# Patient Record
Sex: Female | Born: 1950 | Race: White | Hispanic: No | Marital: Married | Smoking: Current some day smoker
Health system: Southern US, Community
[De-identification: ages and names within clinical notes are randomized; demographics above are authoritative.]

## PROBLEM LIST (undated history)

## (undated) DIAGNOSIS — M1990 Unspecified osteoarthritis, unspecified site: Secondary | ICD-10-CM

## (undated) DIAGNOSIS — E785 Hyperlipidemia, unspecified: Secondary | ICD-10-CM

## (undated) DIAGNOSIS — F32A Depression, unspecified: Secondary | ICD-10-CM

## (undated) DIAGNOSIS — J329 Chronic sinusitis, unspecified: Secondary | ICD-10-CM

## (undated) DIAGNOSIS — M797 Fibromyalgia: Secondary | ICD-10-CM

## (undated) DIAGNOSIS — F319 Bipolar disorder, unspecified: Secondary | ICD-10-CM

## (undated) DIAGNOSIS — F419 Anxiety disorder, unspecified: Secondary | ICD-10-CM

## (undated) DIAGNOSIS — B009 Herpesviral infection, unspecified: Secondary | ICD-10-CM

## (undated) DIAGNOSIS — Z973 Presence of spectacles and contact lenses: Secondary | ICD-10-CM

## (undated) DIAGNOSIS — K219 Gastro-esophageal reflux disease without esophagitis: Principal | ICD-10-CM

## (undated) HISTORY — DX: Bipolar disorder, unspecified: F31.9

## (undated) HISTORY — DX: Fibromyalgia: M79.7

## (undated) HISTORY — DX: Gastro-esophageal reflux disease without esophagitis: K21.9

## (undated) HISTORY — DX: Unspecified osteoarthritis, unspecified site: M19.90

## (undated) HISTORY — DX: Chronic sinusitis, unspecified: J32.9

---
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
## 1984-10-21 HISTORY — PX: TUBAL LIGATION: SHX77

## 1999-02-24 ENCOUNTER — Other Ambulatory Visit: Admission: RE | Admit: 1999-02-24 | Discharge: 1999-02-24 | Payer: Self-pay | Admitting: Family Medicine

## 1999-05-13 ENCOUNTER — Emergency Department (HOSPITAL_COMMUNITY): Admission: EM | Admit: 1999-05-13 | Discharge: 1999-05-13 | Payer: Self-pay | Admitting: Emergency Medicine

## 2000-05-03 ENCOUNTER — Encounter: Admission: RE | Admit: 2000-05-03 | Discharge: 2000-05-03 | Payer: Self-pay | Admitting: Family Medicine

## 2000-05-03 ENCOUNTER — Encounter: Payer: Self-pay | Admitting: Family Medicine

## 2001-05-09 ENCOUNTER — Encounter: Admission: RE | Admit: 2001-05-09 | Discharge: 2001-05-09 | Payer: Self-pay | Admitting: Family Medicine

## 2001-05-09 ENCOUNTER — Encounter: Payer: Self-pay | Admitting: Family Medicine

## 2001-05-29 ENCOUNTER — Encounter: Payer: Self-pay | Admitting: Family Medicine

## 2001-05-29 ENCOUNTER — Encounter: Admission: RE | Admit: 2001-05-29 | Discharge: 2001-05-29 | Payer: Self-pay | Admitting: Family Medicine

## 2002-04-09 ENCOUNTER — Encounter: Admission: RE | Admit: 2002-04-09 | Discharge: 2002-04-09 | Payer: Self-pay | Admitting: Family Medicine

## 2002-04-09 ENCOUNTER — Encounter: Payer: Self-pay | Admitting: Family Medicine

## 2002-06-02 ENCOUNTER — Encounter: Admission: RE | Admit: 2002-06-02 | Discharge: 2002-06-02 | Payer: Self-pay | Admitting: Family Medicine

## 2002-06-02 ENCOUNTER — Encounter: Payer: Self-pay | Admitting: Family Medicine

## 2002-12-31 DIAGNOSIS — K219 Gastro-esophageal reflux disease without esophagitis: Secondary | ICD-10-CM

## 2002-12-31 HISTORY — DX: Gastro-esophageal reflux disease without esophagitis: K21.9

## 2003-08-18 ENCOUNTER — Encounter: Payer: Self-pay | Admitting: Family Medicine

## 2003-08-18 ENCOUNTER — Encounter: Admission: RE | Admit: 2003-08-18 | Discharge: 2003-08-18 | Payer: Self-pay | Admitting: Family Medicine

## 2004-09-07 ENCOUNTER — Ambulatory Visit: Payer: Self-pay | Admitting: Psychiatry

## 2004-09-15 ENCOUNTER — Encounter: Admission: RE | Admit: 2004-09-15 | Discharge: 2004-09-15 | Payer: Self-pay | Admitting: Family Medicine

## 2004-10-09 ENCOUNTER — Ambulatory Visit: Payer: Self-pay | Admitting: Psychology

## 2004-12-28 ENCOUNTER — Ambulatory Visit: Payer: Self-pay | Admitting: Psychiatry

## 2005-03-01 ENCOUNTER — Ambulatory Visit: Payer: Self-pay | Admitting: Psychology

## 2005-05-02 ENCOUNTER — Ambulatory Visit: Payer: Self-pay | Admitting: Psychology

## 2005-06-01 ENCOUNTER — Ambulatory Visit: Payer: Self-pay | Admitting: Psychology

## 2005-07-17 ENCOUNTER — Ambulatory Visit: Payer: Self-pay | Admitting: Psychiatry

## 2005-08-22 ENCOUNTER — Ambulatory Visit: Payer: Self-pay | Admitting: Psychology

## 2005-10-09 ENCOUNTER — Ambulatory Visit: Payer: Self-pay | Admitting: Psychology

## 2005-11-20 ENCOUNTER — Ambulatory Visit: Payer: Self-pay | Admitting: Psychology

## 2006-01-10 ENCOUNTER — Ambulatory Visit: Payer: Self-pay | Admitting: Psychiatry

## 2006-02-28 ENCOUNTER — Ambulatory Visit: Payer: Self-pay | Admitting: Psychology

## 2006-03-29 ENCOUNTER — Ambulatory Visit (HOSPITAL_COMMUNITY): Payer: Self-pay | Admitting: Psychology

## 2006-04-09 ENCOUNTER — Ambulatory Visit (HOSPITAL_COMMUNITY): Payer: Self-pay | Admitting: Psychiatry

## 2006-04-29 ENCOUNTER — Ambulatory Visit (HOSPITAL_COMMUNITY): Payer: Self-pay | Admitting: Psychology

## 2006-05-29 ENCOUNTER — Ambulatory Visit (HOSPITAL_COMMUNITY): Payer: Self-pay | Admitting: Psychology

## 2006-06-25 ENCOUNTER — Ambulatory Visit (HOSPITAL_COMMUNITY): Payer: Self-pay | Admitting: Psychology

## 2006-07-09 ENCOUNTER — Ambulatory Visit (HOSPITAL_COMMUNITY): Payer: Self-pay | Admitting: Psychiatry

## 2006-07-22 ENCOUNTER — Ambulatory Visit (HOSPITAL_COMMUNITY): Payer: Self-pay | Admitting: Psychology

## 2006-08-21 ENCOUNTER — Ambulatory Visit (HOSPITAL_COMMUNITY): Payer: Self-pay | Admitting: Psychology

## 2006-09-11 ENCOUNTER — Ambulatory Visit (HOSPITAL_COMMUNITY): Payer: Self-pay | Admitting: Psychology

## 2006-10-01 ENCOUNTER — Ambulatory Visit (HOSPITAL_COMMUNITY): Payer: Self-pay | Admitting: Psychiatry

## 2006-10-02 ENCOUNTER — Ambulatory Visit (HOSPITAL_COMMUNITY): Payer: Self-pay | Admitting: Psychology

## 2006-10-23 ENCOUNTER — Ambulatory Visit (HOSPITAL_COMMUNITY): Payer: Self-pay | Admitting: Psychology

## 2006-11-28 ENCOUNTER — Ambulatory Visit (HOSPITAL_COMMUNITY): Payer: Self-pay | Admitting: Psychology

## 2006-12-03 ENCOUNTER — Ambulatory Visit (HOSPITAL_COMMUNITY): Payer: Self-pay | Admitting: Psychiatry

## 2006-12-20 ENCOUNTER — Ambulatory Visit (HOSPITAL_COMMUNITY): Payer: Self-pay | Admitting: Psychology

## 2007-01-24 ENCOUNTER — Ambulatory Visit (HOSPITAL_COMMUNITY): Payer: Self-pay | Admitting: Psychology

## 2007-02-04 ENCOUNTER — Ambulatory Visit (HOSPITAL_COMMUNITY): Payer: Self-pay | Admitting: Psychiatry

## 2007-02-21 ENCOUNTER — Ambulatory Visit (HOSPITAL_COMMUNITY): Payer: Self-pay | Admitting: Psychology

## 2007-03-21 ENCOUNTER — Ambulatory Visit (HOSPITAL_COMMUNITY): Payer: Self-pay | Admitting: Psychology

## 2007-04-08 ENCOUNTER — Ambulatory Visit (HOSPITAL_COMMUNITY): Payer: Self-pay | Admitting: Psychiatry

## 2007-04-25 ENCOUNTER — Ambulatory Visit (HOSPITAL_COMMUNITY): Payer: Self-pay | Admitting: Psychology

## 2007-05-21 ENCOUNTER — Ambulatory Visit (HOSPITAL_COMMUNITY): Payer: Self-pay | Admitting: Psychology

## 2007-06-24 ENCOUNTER — Ambulatory Visit (HOSPITAL_COMMUNITY): Payer: Self-pay | Admitting: Psychology

## 2007-07-08 ENCOUNTER — Ambulatory Visit (HOSPITAL_COMMUNITY): Payer: Self-pay | Admitting: Psychiatry

## 2007-07-24 ENCOUNTER — Ambulatory Visit (HOSPITAL_COMMUNITY): Payer: Self-pay | Admitting: Psychology

## 2007-08-26 ENCOUNTER — Ambulatory Visit (HOSPITAL_COMMUNITY): Payer: Self-pay | Admitting: Psychology

## 2007-09-25 ENCOUNTER — Ambulatory Visit (HOSPITAL_COMMUNITY): Payer: Self-pay | Admitting: Psychology

## 2007-09-29 ENCOUNTER — Ambulatory Visit (HOSPITAL_COMMUNITY): Admission: RE | Admit: 2007-09-29 | Discharge: 2007-09-29 | Payer: Self-pay | Admitting: Obstetrics & Gynecology

## 2007-10-02 ENCOUNTER — Ambulatory Visit (HOSPITAL_COMMUNITY): Payer: Self-pay | Admitting: Psychiatry

## 2007-10-24 ENCOUNTER — Ambulatory Visit (HOSPITAL_COMMUNITY): Payer: Self-pay | Admitting: Psychology

## 2007-11-21 ENCOUNTER — Ambulatory Visit (HOSPITAL_COMMUNITY): Payer: Self-pay | Admitting: Psychology

## 2007-12-19 ENCOUNTER — Ambulatory Visit (HOSPITAL_COMMUNITY): Payer: Self-pay | Admitting: Psychology

## 2007-12-23 ENCOUNTER — Ambulatory Visit (HOSPITAL_COMMUNITY): Payer: Self-pay | Admitting: Psychiatry

## 2008-01-07 ENCOUNTER — Ambulatory Visit (HOSPITAL_COMMUNITY): Payer: Self-pay | Admitting: Psychology

## 2008-02-06 ENCOUNTER — Ambulatory Visit (HOSPITAL_COMMUNITY): Payer: Self-pay | Admitting: Psychology

## 2008-02-20 ENCOUNTER — Encounter: Admission: RE | Admit: 2008-02-20 | Discharge: 2008-02-20 | Payer: Self-pay | Admitting: Family

## 2008-03-05 ENCOUNTER — Ambulatory Visit (HOSPITAL_COMMUNITY): Payer: Self-pay | Admitting: Psychology

## 2008-03-18 ENCOUNTER — Ambulatory Visit (HOSPITAL_COMMUNITY): Payer: Self-pay | Admitting: Psychiatry

## 2008-04-02 ENCOUNTER — Ambulatory Visit (HOSPITAL_COMMUNITY): Payer: Self-pay | Admitting: Psychology

## 2008-04-30 ENCOUNTER — Ambulatory Visit (HOSPITAL_COMMUNITY): Payer: Self-pay | Admitting: Psychology

## 2008-05-26 ENCOUNTER — Ambulatory Visit (HOSPITAL_COMMUNITY): Payer: Self-pay | Admitting: Psychology

## 2008-06-10 ENCOUNTER — Ambulatory Visit (HOSPITAL_COMMUNITY): Payer: Self-pay | Admitting: Psychiatry

## 2008-06-16 ENCOUNTER — Ambulatory Visit (HOSPITAL_COMMUNITY): Payer: Self-pay | Admitting: Psychology

## 2008-08-05 ENCOUNTER — Ambulatory Visit (HOSPITAL_COMMUNITY): Payer: Self-pay | Admitting: Psychology

## 2008-09-09 ENCOUNTER — Ambulatory Visit (HOSPITAL_COMMUNITY): Payer: Self-pay | Admitting: Psychiatry

## 2008-09-21 ENCOUNTER — Ambulatory Visit (HOSPITAL_COMMUNITY): Payer: Self-pay | Admitting: Psychology

## 2008-10-22 ENCOUNTER — Ambulatory Visit (HOSPITAL_COMMUNITY): Payer: Self-pay | Admitting: Psychology

## 2008-11-22 ENCOUNTER — Ambulatory Visit (HOSPITAL_COMMUNITY): Payer: Self-pay | Admitting: Psychology

## 2008-12-02 ENCOUNTER — Ambulatory Visit (HOSPITAL_COMMUNITY): Payer: Self-pay | Admitting: Psychiatry

## 2008-12-21 ENCOUNTER — Ambulatory Visit (HOSPITAL_COMMUNITY): Payer: Self-pay | Admitting: Psychology

## 2009-01-21 ENCOUNTER — Ambulatory Visit (HOSPITAL_COMMUNITY): Payer: Self-pay | Admitting: Psychology

## 2009-02-22 ENCOUNTER — Ambulatory Visit (HOSPITAL_COMMUNITY): Payer: Self-pay | Admitting: Psychology

## 2009-03-24 ENCOUNTER — Ambulatory Visit (HOSPITAL_COMMUNITY): Payer: Self-pay | Admitting: Psychology

## 2009-03-29 ENCOUNTER — Ambulatory Visit (HOSPITAL_COMMUNITY): Payer: Self-pay | Admitting: Psychiatry

## 2009-04-06 ENCOUNTER — Encounter: Admission: RE | Admit: 2009-04-06 | Discharge: 2009-04-06 | Payer: Self-pay | Admitting: Family Medicine

## 2009-04-21 ENCOUNTER — Ambulatory Visit (HOSPITAL_COMMUNITY): Payer: Self-pay | Admitting: Psychology

## 2009-06-01 ENCOUNTER — Ambulatory Visit (HOSPITAL_COMMUNITY): Payer: Self-pay | Admitting: Psychology

## 2009-06-29 ENCOUNTER — Ambulatory Visit (HOSPITAL_COMMUNITY): Payer: Self-pay | Admitting: Psychology

## 2009-08-17 ENCOUNTER — Ambulatory Visit (HOSPITAL_COMMUNITY): Payer: Self-pay | Admitting: Psychology

## 2009-08-23 ENCOUNTER — Ambulatory Visit (HOSPITAL_COMMUNITY): Payer: Self-pay | Admitting: Psychiatry

## 2009-09-08 ENCOUNTER — Ambulatory Visit (HOSPITAL_COMMUNITY): Payer: Self-pay | Admitting: Psychology

## 2009-10-18 ENCOUNTER — Ambulatory Visit (HOSPITAL_COMMUNITY): Payer: Self-pay | Admitting: Psychology

## 2009-11-17 ENCOUNTER — Ambulatory Visit (HOSPITAL_COMMUNITY): Payer: Self-pay | Admitting: Psychiatry

## 2009-11-29 ENCOUNTER — Ambulatory Visit (HOSPITAL_COMMUNITY): Payer: Self-pay | Admitting: Psychology

## 2010-01-03 ENCOUNTER — Ambulatory Visit (HOSPITAL_COMMUNITY): Payer: Self-pay | Admitting: Psychology

## 2010-02-02 ENCOUNTER — Ambulatory Visit (HOSPITAL_COMMUNITY): Payer: Self-pay | Admitting: Psychology

## 2010-02-16 ENCOUNTER — Ambulatory Visit (HOSPITAL_COMMUNITY): Payer: Self-pay | Admitting: Psychiatry

## 2010-03-01 ENCOUNTER — Ambulatory Visit (HOSPITAL_COMMUNITY): Payer: Self-pay | Admitting: Psychology

## 2010-04-04 ENCOUNTER — Ambulatory Visit (HOSPITAL_COMMUNITY): Payer: Self-pay | Admitting: Psychology

## 2010-05-04 ENCOUNTER — Ambulatory Visit (HOSPITAL_COMMUNITY): Payer: Self-pay | Admitting: Psychology

## 2010-05-16 ENCOUNTER — Ambulatory Visit (HOSPITAL_COMMUNITY): Payer: Self-pay | Admitting: Psychiatry

## 2010-06-01 ENCOUNTER — Ambulatory Visit (HOSPITAL_COMMUNITY): Payer: Self-pay | Admitting: Psychology

## 2010-06-13 ENCOUNTER — Encounter: Admission: RE | Admit: 2010-06-13 | Discharge: 2010-06-13 | Payer: Self-pay | Admitting: Family Medicine

## 2010-06-16 ENCOUNTER — Encounter: Admission: RE | Admit: 2010-06-16 | Discharge: 2010-06-16 | Payer: Self-pay | Admitting: Family Medicine

## 2010-07-05 ENCOUNTER — Ambulatory Visit (HOSPITAL_COMMUNITY): Payer: Self-pay | Admitting: Psychology

## 2010-08-04 ENCOUNTER — Ambulatory Visit (HOSPITAL_COMMUNITY): Payer: Self-pay | Admitting: Psychology

## 2010-08-15 ENCOUNTER — Ambulatory Visit (HOSPITAL_COMMUNITY): Payer: Self-pay | Admitting: Psychiatry

## 2010-09-06 ENCOUNTER — Ambulatory Visit (HOSPITAL_COMMUNITY): Payer: Self-pay | Admitting: Psychology

## 2010-10-05 ENCOUNTER — Ambulatory Visit (HOSPITAL_COMMUNITY): Payer: Self-pay | Admitting: Psychology

## 2010-10-19 ENCOUNTER — Ambulatory Visit (HOSPITAL_COMMUNITY): Payer: Self-pay | Admitting: Psychiatry

## 2010-11-10 ENCOUNTER — Ambulatory Visit (HOSPITAL_COMMUNITY): Payer: Self-pay | Admitting: Psychology

## 2010-12-07 ENCOUNTER — Encounter
Admission: RE | Admit: 2010-12-07 | Discharge: 2010-12-07 | Payer: Self-pay | Source: Home / Self Care | Admitting: Family Medicine

## 2010-12-08 ENCOUNTER — Ambulatory Visit (HOSPITAL_COMMUNITY): Payer: Self-pay | Admitting: Psychology

## 2011-01-03 ENCOUNTER — Ambulatory Visit (HOSPITAL_COMMUNITY)
Admission: RE | Admit: 2011-01-03 | Discharge: 2011-01-03 | Payer: Self-pay | Source: Home / Self Care | Attending: Psychology | Admitting: Psychology

## 2011-01-17 ENCOUNTER — Ambulatory Visit (HOSPITAL_COMMUNITY)
Admission: RE | Admit: 2011-01-17 | Discharge: 2011-01-17 | Payer: Self-pay | Source: Home / Self Care | Attending: Psychology | Admitting: Psychology

## 2011-01-18 ENCOUNTER — Ambulatory Visit (HOSPITAL_COMMUNITY)
Admission: RE | Admit: 2011-01-18 | Discharge: 2011-01-18 | Payer: Self-pay | Source: Home / Self Care | Attending: Psychiatry | Admitting: Psychiatry

## 2011-01-30 ENCOUNTER — Ambulatory Visit (HOSPITAL_COMMUNITY)
Admission: RE | Admit: 2011-01-30 | Discharge: 2011-01-30 | Payer: Self-pay | Source: Home / Self Care | Attending: Psychology | Admitting: Psychology

## 2011-02-20 ENCOUNTER — Encounter (HOSPITAL_COMMUNITY): Payer: Self-pay | Admitting: Psychiatry

## 2011-02-28 ENCOUNTER — Encounter (HOSPITAL_COMMUNITY): Payer: Self-pay | Admitting: Psychology

## 2011-03-14 ENCOUNTER — Encounter (HOSPITAL_COMMUNITY): Payer: Self-pay | Admitting: Psychology

## 2011-03-15 ENCOUNTER — Encounter (INDEPENDENT_AMBULATORY_CARE_PROVIDER_SITE_OTHER): Payer: Medicare Other | Admitting: Psychiatry

## 2011-03-15 DIAGNOSIS — F39 Unspecified mood [affective] disorder: Secondary | ICD-10-CM

## 2011-03-21 ENCOUNTER — Encounter (INDEPENDENT_AMBULATORY_CARE_PROVIDER_SITE_OTHER): Payer: Medicare Other | Admitting: Psychology

## 2011-03-21 DIAGNOSIS — F314 Bipolar disorder, current episode depressed, severe, without psychotic features: Secondary | ICD-10-CM

## 2011-04-18 ENCOUNTER — Encounter (INDEPENDENT_AMBULATORY_CARE_PROVIDER_SITE_OTHER): Payer: Medicare Other | Admitting: Psychology

## 2011-04-18 DIAGNOSIS — F314 Bipolar disorder, current episode depressed, severe, without psychotic features: Secondary | ICD-10-CM

## 2011-05-15 ENCOUNTER — Encounter (INDEPENDENT_AMBULATORY_CARE_PROVIDER_SITE_OTHER): Payer: Medicare Other | Admitting: Psychology

## 2011-05-15 DIAGNOSIS — F314 Bipolar disorder, current episode depressed, severe, without psychotic features: Secondary | ICD-10-CM

## 2011-05-30 ENCOUNTER — Encounter (INDEPENDENT_AMBULATORY_CARE_PROVIDER_SITE_OTHER): Payer: Medicare Other | Admitting: Psychology

## 2011-05-30 DIAGNOSIS — F314 Bipolar disorder, current episode depressed, severe, without psychotic features: Secondary | ICD-10-CM

## 2011-06-07 ENCOUNTER — Encounter (INDEPENDENT_AMBULATORY_CARE_PROVIDER_SITE_OTHER): Payer: Medicare Other | Admitting: Psychiatry

## 2011-06-07 DIAGNOSIS — F3189 Other bipolar disorder: Secondary | ICD-10-CM

## 2011-06-29 ENCOUNTER — Encounter (INDEPENDENT_AMBULATORY_CARE_PROVIDER_SITE_OTHER): Payer: Medicare Other | Admitting: Psychology

## 2011-06-29 DIAGNOSIS — F314 Bipolar disorder, current episode depressed, severe, without psychotic features: Secondary | ICD-10-CM

## 2011-07-05 ENCOUNTER — Other Ambulatory Visit: Payer: Self-pay | Admitting: Family Medicine

## 2011-07-05 DIAGNOSIS — R921 Mammographic calcification found on diagnostic imaging of breast: Secondary | ICD-10-CM

## 2011-07-10 ENCOUNTER — Ambulatory Visit
Admission: RE | Admit: 2011-07-10 | Discharge: 2011-07-10 | Disposition: A | Discharge status: Home / Self Care | Payer: Medicare Other | Source: Ambulatory Visit | Attending: Family Medicine | Admitting: Family Medicine

## 2011-07-10 DIAGNOSIS — R921 Mammographic calcification found on diagnostic imaging of breast: Secondary | ICD-10-CM

## 2011-07-27 ENCOUNTER — Encounter (HOSPITAL_COMMUNITY): Payer: Medicare Other | Admitting: Psychology

## 2011-07-31 ENCOUNTER — Encounter (INDEPENDENT_AMBULATORY_CARE_PROVIDER_SITE_OTHER): Payer: Medicare Other | Admitting: Psychology

## 2011-07-31 DIAGNOSIS — F314 Bipolar disorder, current episode depressed, severe, without psychotic features: Secondary | ICD-10-CM

## 2011-08-31 ENCOUNTER — Encounter (INDEPENDENT_AMBULATORY_CARE_PROVIDER_SITE_OTHER): Payer: Medicare Other | Admitting: Psychology

## 2011-08-31 DIAGNOSIS — F314 Bipolar disorder, current episode depressed, severe, without psychotic features: Secondary | ICD-10-CM

## 2011-09-06 ENCOUNTER — Encounter (INDEPENDENT_AMBULATORY_CARE_PROVIDER_SITE_OTHER): Payer: Medicare Other | Admitting: Psychiatry

## 2011-09-06 DIAGNOSIS — F3130 Bipolar disorder, current episode depressed, mild or moderate severity, unspecified: Secondary | ICD-10-CM

## 2011-10-02 ENCOUNTER — Encounter (INDEPENDENT_AMBULATORY_CARE_PROVIDER_SITE_OTHER): Payer: Medicare Other | Admitting: Psychology

## 2011-10-02 DIAGNOSIS — F314 Bipolar disorder, current episode depressed, severe, without psychotic features: Secondary | ICD-10-CM

## 2011-11-02 ENCOUNTER — Encounter (INDEPENDENT_AMBULATORY_CARE_PROVIDER_SITE_OTHER): Payer: Medicare Other | Admitting: Psychology

## 2011-11-02 DIAGNOSIS — F314 Bipolar disorder, current episode depressed, severe, without psychotic features: Secondary | ICD-10-CM

## 2011-11-29 ENCOUNTER — Encounter (HOSPITAL_COMMUNITY): Payer: Medicare Other | Admitting: Psychiatry

## 2011-11-29 ENCOUNTER — Ambulatory Visit (INDEPENDENT_AMBULATORY_CARE_PROVIDER_SITE_OTHER): Payer: Medicare Other | Admitting: Psychiatry

## 2011-11-29 ENCOUNTER — Encounter (HOSPITAL_COMMUNITY): Payer: Self-pay | Admitting: Psychiatry

## 2011-11-29 VITALS — Wt 138.0 lb

## 2011-11-29 DIAGNOSIS — F319 Bipolar disorder, unspecified: Secondary | ICD-10-CM

## 2011-11-29 MED ORDER — TRAZODONE HCL 100 MG PO TABS: 100.0000 mg | ORAL_TABLET | Freq: Every day | ORAL | Status: DC

## 2011-11-29 MED ORDER — GABAPENTIN 300 MG PO CAPS: ORAL_CAPSULE | ORAL | Status: DC

## 2011-11-29 MED ORDER — PAROXETINE HCL 40 MG PO TABS: 40.0000 mg | ORAL_TABLET | ORAL | Status: DC

## 2011-11-29 NOTE — Progress Notes (Signed)
Patient came for her followup appointment. She's been doing good on her current medication. She had a good Thanksgiving. Though she misses her deceased husband but she had a good time with her son. She has some crying spells but overall she is back to her normal routine life. She is compliant with her medication and reported no side effects. She is sleeping better. She denies any agitation anger or mood swings.  Mental status emanation The patient is casually dressed. She maintained good eye contact. Her speech is soft clear and coherent. She denies any active or passive suicidal thinking or homicidal thinking. She denies any auditory or visual hallucination. There are no psychotic symptoms. She's alert and oriented x3. Her attention and concentration is fair. Her insight judgment and impulse control is okay.  Assessment Bipolar disorder NOS  Plan I will continue her current medication which is Neurontin 300 mg 3 times a day and 900 at bedtime, Paxil 40 mg daily and trazodone 100 mg at bedtime. I have explained risks and benefits of medication in detail. She will see therapist regularly. I recommended to call us if she has any question or concern about the medicine. I will see her again in 2 months

## 2011-12-04 ENCOUNTER — Encounter (HOSPITAL_COMMUNITY): Payer: Medicare Other | Admitting: Psychology

## 2011-12-04 ENCOUNTER — Encounter (HOSPITAL_COMMUNITY): Payer: Self-pay | Admitting: Psychology

## 2011-12-04 ENCOUNTER — Ambulatory Visit (INDEPENDENT_AMBULATORY_CARE_PROVIDER_SITE_OTHER): Payer: Medicare Other | Admitting: Psychology

## 2011-12-04 DIAGNOSIS — F3111 Bipolar disorder, current episode manic without psychotic features, mild: Secondary | ICD-10-CM

## 2011-12-04 NOTE — Progress Notes (Signed)
Patient:  Katherine Middleton   DOB: 1951/09/24  MR Number: [id number]  Location: BEHAVIORAL [facility] [address] [place] [address] Dept: [phone]  Start: 1 PM End: 2 PM  Provider/Observer:     Hershal Coria PSYD  Chief Complaint:      Chief Complaint  Patient presents with  . Anxiety  . Depression    Reason For Service:     The patient was referred for psychotherapeutic interventions in conjunction with ongoing psychiatric care. The patient has been diagnosed with bipolar disorder but has also had a number of medical issues including gastrointestinal problems, fibromyalgia, and a spastic colon.  Interventions Strategy:  Cognitive/behavioral psychotherapy  Participation Level:   Active  Participation Quality:  Appropriate      Behavioral Observation:  Well Groomed, Alert, and Appropriate.   Current Psychosocial Factors: The patient has continued to work on her new relationship with a gentleman that she has met recently. She is taking thinks low but that there've been some conflicts with her son about this relationship. The patient reports that her mood has been quite good in that she has continued to actively work on therapeutic interventions we have develop.  Content of Session:   Review current symptoms and continued to work on cognitive/behavioral therapeutic interventions.  Current Status:   The patient reports that her mood has been on the upswing recently but it has not gotten out of control. She reports that she actively worked on strategies we have develop for keeping her mood from becoming too manic.  Patient Progress:   Very good  Target Goals:   Target goals include mood stability, better coping skills, keeping issues and symptoms of depression from becoming too problematic and making situations when she becomes hypomanic or manic he from creating difficulties.  Last  Reviewed:   12/04/2011  Goals Addressed Today:    Today we worked on issues of mood stability particularly around the emerging hypomanic state.  Impression/Diagnosis:   The patient has a long-standing history of difficulties with anxiety and significant mood disturbance. She has been diagnosed with a formal diagnosis of bipolar disorder.  Diagnosis:    Axis I:  1. Bipolar I disorder, most recent episode (or current) manic, mild         Axis II: No diagnosis

## 2012-01-11 ENCOUNTER — Ambulatory Visit (INDEPENDENT_AMBULATORY_CARE_PROVIDER_SITE_OTHER): Payer: Medicare Other | Admitting: Psychology

## 2012-01-11 DIAGNOSIS — F3112 Bipolar disorder, current episode manic without psychotic features, moderate: Secondary | ICD-10-CM

## 2012-01-11 NOTE — Progress Notes (Signed)
Patient:  Katherine Middleton   DOB: 05/10/1951  MR Number: [id number]  Location: BEHAVIORAL [facility] [address] [place] [address] Dept: [phone]  Start: 1 PM End: 2 PM  Provider/Observer:     Hershal Coria PSYD  Chief Complaint:      Chief Complaint  Patient presents with  . Anxiety  . Agitation  . Depression    Reason For Service:     The patient was referred for psychotherapeutic interventions in conjunction with ongoing psychiatric care. The patient has been diagnosed with bipolar disorder but has also had a number of medical issues including gastrointestinal problems, fibromyalgia, and a spastic colon.  Interventions Strategy:  Cognitive/behavioral psychotherapy  Participation Level:   Active  Participation Quality:  Appropriate      Behavioral Observation:  Well Groomed, Alert, and Appropriate.   Current Psychosocial Factors: The patient is still dealing with issues related to a new relationship after her husband died and dealing with her oldest son's hesitation to except this relationship..  Content of Session:   Review current symptoms and continued to work on cognitive/behavioral therapeutic interventions.  Current Status:   The patient reports that her mood has been on the upswing recently but it has not gotten out of control. She reports that she actively worked on strategies we have develop for keeping her mood from becoming too manic.  Patient Progress:   Very good  Target Goals:   Target goals include mood stability, better coping skills, keeping issues and symptoms of depression from becoming too problematic and making situations when she becomes hypomanic or manic he from creating difficulties.  Last Reviewed:   01/11/2012  Goals Addressed Today:    Today we worked on issues of mood stability particularly around the emerging hypomanic state.  Impression/Diagnosis:   The  patient has a long-standing history of difficulties with anxiety and significant mood disturbance. She has been diagnosed with a formal diagnosis of bipolar disorder.  Diagnosis:    Axis I:  1. Bipolar I disorder, most recent episode (or current) manic, moderate         Axis II: No diagnosis

## 2012-02-08 ENCOUNTER — Encounter (HOSPITAL_COMMUNITY): Payer: Self-pay | Admitting: Psychology

## 2012-02-08 ENCOUNTER — Ambulatory Visit (INDEPENDENT_AMBULATORY_CARE_PROVIDER_SITE_OTHER): Payer: Medicare Other | Admitting: Psychology

## 2012-02-08 DIAGNOSIS — F3112 Bipolar disorder, current episode manic without psychotic features, moderate: Secondary | ICD-10-CM

## 2012-02-08 NOTE — Progress Notes (Signed)
Patient:  Katherine Middleton   DOB: May 19, 1951  MR Number: [id number]  Location: BEHAVIORAL [facility] [address] [place] [address] Dept: [phone]  [place]: 1 PM End: 2 PM  Provider/Observer:     Hershal Coria PSYD  Chief Complaint:      Chief Complaint  Patient presents with  . Anxiety  . Depression  . Manic Behavior    Reason For Service:     The patient was referred for psychotherapeutic interventions in conjunction with ongoing psychiatric care. The patient has been diagnosed with bipolar disorder but has also had a number of medical issues including gastrointestinal problems, fibromyalgia, and a spastic colon.  Interventions Strategy:  Cognitive/behavioral psychotherapy  Participation Level:   Active  Participation Quality:  Appropriate      Behavioral Observation:  Well Groomed, Alert, and Appropriate.   Current Psychosocial Factors: The patient is still dealing with issues related to a new relationship after her husband died and dealing with her oldest son's hesitation to except this relationship..  Content of Session:   Review current symptoms and continued to work on cognitive/behavioral therapeutic interventions.  Current Status:   The patient reports that her mood has been on the upswing recently but it has not gotten out of control. She reports that she actively worked on strategies we have develop for keeping her mood from becoming too manic.  Patient Progress:   Very good  Target Goals:   Target goals include mood stability, better coping skills, keeping issues and symptoms of depression from becoming too problematic and making situations when she becomes hypomanic or manic he from creating difficulties.  Last Reviewed:   02/08/2012  Goals Addressed Today:    Today we worked on issues of mood stability particularly around the emerging hypomanic  state.  Impression/Diagnosis:   The patient has a long-standing history of difficulties with anxiety and significant mood disturbance. She has been diagnosed with a formal diagnosis of bipolar disorder.  Diagnosis:    Axis I:  1. Bipolar I disorder, most recent episode (or current) manic, moderate         Axis II: No diagnosis

## 2012-02-19 ENCOUNTER — Ambulatory Visit (INDEPENDENT_AMBULATORY_CARE_PROVIDER_SITE_OTHER): Payer: Medicare Other | Admitting: Psychiatry

## 2012-02-19 ENCOUNTER — Encounter (HOSPITAL_COMMUNITY): Payer: Self-pay | Admitting: Psychiatry

## 2012-02-19 VITALS — Wt 142.0 lb

## 2012-02-19 DIAGNOSIS — F319 Bipolar disorder, unspecified: Secondary | ICD-10-CM

## 2012-02-19 MED ORDER — GABAPENTIN 300 MG PO CAPS: ORAL_CAPSULE | ORAL | Status: DC

## 2012-02-19 MED ORDER — TRAZODONE HCL 100 MG PO TABS: 100.0000 mg | ORAL_TABLET | Freq: Every day | ORAL | Status: DC

## 2012-02-19 MED ORDER — PAROXETINE HCL 40 MG PO TABS: 40.0000 mg | ORAL_TABLET | ORAL | Status: DC
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
## 2012-02-21 ENCOUNTER — Ambulatory Visit (HOSPITAL_COMMUNITY): Payer: Medicare Other | Admitting: Psychiatry

## 2012-03-12 ENCOUNTER — Ambulatory Visit (INDEPENDENT_AMBULATORY_CARE_PROVIDER_SITE_OTHER): Payer: Medicare Other | Admitting: Psychology

## 2012-03-12 DIAGNOSIS — F3112 Bipolar disorder, current episode manic without psychotic features, moderate: Secondary | ICD-10-CM

## 2012-03-19 ENCOUNTER — Encounter (HOSPITAL_COMMUNITY): Payer: Self-pay | Admitting: Psychology
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
## 2012-04-16 ENCOUNTER — Ambulatory Visit (HOSPITAL_COMMUNITY): Payer: Self-pay | Admitting: Psychology

## 2012-05-02 ENCOUNTER — Ambulatory Visit (INDEPENDENT_AMBULATORY_CARE_PROVIDER_SITE_OTHER): Payer: Self-pay | Admitting: Psychology

## 2012-05-02 DIAGNOSIS — F3112 Bipolar disorder, current episode manic without psychotic features, moderate: Secondary | ICD-10-CM

## 2012-05-20 ENCOUNTER — Encounter (HOSPITAL_COMMUNITY): Payer: Self-pay | Admitting: Psychiatry

## 2012-05-20 ENCOUNTER — Ambulatory Visit (INDEPENDENT_AMBULATORY_CARE_PROVIDER_SITE_OTHER): Payer: Self-pay | Admitting: Psychiatry

## 2012-05-20 VITALS — Wt 147.0 lb

## 2012-05-20 DIAGNOSIS — F319 Bipolar disorder, unspecified: Secondary | ICD-10-CM

## 2012-05-20 MED ORDER — PAROXETINE HCL 40 MG PO TABS: 40.0000 mg | ORAL_TABLET | ORAL | Status: DC

## 2012-05-20 MED ORDER — GABAPENTIN 300 MG PO CAPS: ORAL_CAPSULE | ORAL | Status: DC

## 2012-05-20 MED ORDER — TRAZODONE HCL 100 MG PO TABS: 100.0000 mg | ORAL_TABLET | Freq: Every day | ORAL | Status: DC

## 2012-05-20 NOTE — Progress Notes (Signed)
Chief complaint Medication management and followup.    History of present illness Patient is 61 year old recently widowed female came for her followup appointment.  She's compliant with her psychiatric medication.  She continued to endorse chronic pain and fibromyalgia which has been getting worse in past few months.  She is scheduled to see her primary care physician at [facility] physician next week.  She likes her psychiatric medication.  She sleeps fine.  She denies any agitation anger mood swing.  She endorse her relationship is going well.  She denies any crying spells however she do miss her deceased husband who died last year.  She denies any nightmare or flashbacks .  She's in contact with her son regularly.  Her appetite and weight is unchanged from the past.  Current psychiatric medication Xanax 0.5 mg up to 3 times a day prescribed by PCP  Paxil 40 mg daily Trazodone 100 mg at bedtime Neurontin 300 mg 3 times a day and 3 at bedtime  Past psychiatric history Patient has a long history of depression and bipolar disorder started in 2003.  She has been seeing in this office since then.  She denies any history of suicidal attempt or any inpatient psychiatric treatment however endorse history of severe depression with passive suicidal thinking and anger issues.  She is seeing therapist in this office regularly.  Medical history Patient has history of fibromyalgia, arthritis and osteoarthritis. She see Belenda Cruise at [facility].  She is scheduled to see her again next week.  Mental status examination Patient is casually dressed and fairly groomed. She is calm cooperative and maintained good eye contact. Her speech is soft clear and coherent. Her thought process is logical linear and goal-directed. She denies active or passive suicidal thinking and homicidal thinking. She denies any auditory or visual hallucination. Her attention and concentration is fair. She described her  mood is okay and her affect is mood congruent. There no psychotic symptoms present at this time. She's alert and oriented x3. Her insight judgment and impulse control is okay.  Assessment Axis I bipolar disorder NOS Axis II deferred Axis III see medical history Axis IV mild to moderate  Plan I will continue her Paxil trazodone and Neurontin.  I recommend to have her blood work sent to [place] and she see her primary care physician next week.  I explained risks and benefits of medication in detail.  I recommend to call us if she is a question or concern about the medication otherwise I will see her again in 3 months.

## 2012-06-04 ENCOUNTER — Ambulatory Visit (INDEPENDENT_AMBULATORY_CARE_PROVIDER_SITE_OTHER): Payer: Self-pay | Admitting: Psychology

## 2012-06-04 DIAGNOSIS — F3112 Bipolar disorder, current episode manic without psychotic features, moderate: Secondary | ICD-10-CM

## 2012-06-05 ENCOUNTER — Encounter (HOSPITAL_COMMUNITY): Payer: Self-pay | Admitting: Psychology
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
## 2012-06-19 ENCOUNTER — Encounter (HOSPITAL_COMMUNITY): Payer: Self-pay | Admitting: Psychology

## 2012-06-19 NOTE — Progress Notes (Signed)
Patient:  Katherine Middleton   DOB: [birth year]  MR Number: [id number]  Location: BEHAVIORAL [facility]- [address] [place] [address] Dept: [phone]  Start: 2 PM End: 3 PM  Provider/Observer:     Hershal Coria PSYD  Chief Complaint:      Chief Complaint  Patient presents with  . Agitation  . Anxiety  . Stress    Reason For Service:     The patient was referred for psychotherapeutic interventions in conjunction with ongoing psychiatric care. The patient has been diagnosed with bipolar disorder but has also had a number of medical issues including gastrointestinal problems, fibromyalgia, and a spastic colon.  Interventions Strategy:  Cognitive/behavioral psychotherapy  Participation Level:   Active  Participation Quality:  Appropriate      Behavioral Observation:  Well Groomed, Alert, and Appropriate.   Current Psychosocial Factors: The patient reports that she has been struggling with the fact that her boyfriend is now serving time because of his DUI and will be there for as much as a year. She is working out ways that she can go visit him and she is planning on maintaining this relationship.  Content of Session:   Review current symptoms and continued to work on cognitive/behavioral therapeutic interventions.  Current Status:   The patient reports that her mood has been generally stable likely and that she's not been experiencing either manic or depressive events even with a stress..  Patient Progress:   Very good  Target Goals:   Target goals include mood stability, better coping skills, keeping issues and symptoms of depression from becoming too problematic and making situations when she becomes hypomanic or manic he from creating difficulties.  Last Reviewed:   05/02/2012  Goals Addressed Today:    T we continued to work on issues related to mood stability and better coping skills around  recurrent depression and manic/hypomanic events..  Impression/Diagnosis:   The patient has a long-standing history of difficulties with anxiety and significant mood disturbance. She has been diagnosed with a formal diagnosis of bipolar disorder.  Diagnosis:    Axis I:  1. Bipolar I disorder, most recent episode (or current) manic, moderate         Axis II: No diagnosis

## 2012-07-01 ENCOUNTER — Ambulatory Visit (INDEPENDENT_AMBULATORY_CARE_PROVIDER_SITE_OTHER): Payer: Medicare Other | Admitting: Psychology

## 2012-07-01 DIAGNOSIS — F3112 Bipolar disorder, current episode manic without psychotic features, moderate: Secondary | ICD-10-CM

## 2012-07-09 ENCOUNTER — Other Ambulatory Visit: Payer: Self-pay | Admitting: Family Medicine

## 2012-07-09 DIAGNOSIS — M810 Age-related osteoporosis without current pathological fracture: Secondary | ICD-10-CM

## 2012-07-09 DIAGNOSIS — Z1231 Encounter for screening mammogram for malignant neoplasm of breast: Secondary | ICD-10-CM

## 2012-07-25 ENCOUNTER — Ambulatory Visit
Admission: RE | Admit: 2012-07-25 | Discharge: 2012-07-25 | Disposition: A | Discharge status: Home / Self Care | Payer: Medicare Other | Source: Ambulatory Visit | Attending: Family Medicine | Admitting: Family Medicine

## 2012-07-25 DIAGNOSIS — Z1231 Encounter for screening mammogram for malignant neoplasm of breast: Secondary | ICD-10-CM

## 2012-07-25 DIAGNOSIS — M810 Age-related osteoporosis without current pathological fracture: Secondary | ICD-10-CM

## 2012-08-01 ENCOUNTER — Ambulatory Visit (INDEPENDENT_AMBULATORY_CARE_PROVIDER_SITE_OTHER): Payer: Medicare Other | Admitting: Psychology

## 2012-08-01 DIAGNOSIS — F3112 Bipolar disorder, current episode manic without psychotic features, moderate: Secondary | ICD-10-CM

## 2012-08-05 ENCOUNTER — Encounter (HOSPITAL_COMMUNITY): Payer: Self-pay | Admitting: Psychology

## 2012-08-05 NOTE — Progress Notes (Signed)
Patient:  Katherine Middleton   DOB: [birth year]  MR Number: [id number]  Location: BEHAVIORAL [facility] [address] [place] [address] Dept: [phone]  Start: 1 PM End: 2 PM  Provider/Observer:     Hershal Coria PSYD  Chief Complaint:      Chief Complaint  Patient presents with  . Anxiety  . Stress  . Agitation    Reason For Service:     The patient was referred for psychotherapeutic interventions in conjunction with ongoing psychiatric care. The patient has been diagnosed with bipolar disorder but has also had a number of medical issues including gastrointestinal problems, fibromyalgia, and a spastic colon.  Interventions Strategy:  Cognitive/behavioral psychotherapy  Participation Level:   Active  Participation Quality:  Appropriate      Behavioral Observation:  Well Groomed, Alert, and Appropriate.   Current Psychosocial Factors: The patient has been doing better overall after experiencing a depressive episode a couple weeks ago. The patient reports that she is now in the mildly manic phase but is doing all of her coping skills and strategies. She is continuing to take her medicine as prescribed. The patient reports that she is hopeful she will be able to work out the issues with the [facility] prison system so she can begin seeing her boyfriend again..  Content of Session:   Review current symptoms and continued to work on cognitive/behavioral therapeutic interventions.  Current Status:   The patient reports that she did have a mild or short episode of depression but it is again been going into a mildly manic phase..  Patient Progress:   Very good  Target Goals:   Target goals include mood stability, better coping skills, keeping issues and symptoms of depression from becoming too problematic and making situations when she becomes hypomanic or manic he from creating difficulties.  Last  Reviewed:   07/01/2012  Goals Addressed Today:    T we continued to work on issues related to mood stability and better coping skills around recurrent depression and manic/hypomanic events..  Impression/Diagnosis:   The patient has a long-standing history of difficulties with anxiety and significant mood disturbance. She has been diagnosed with a formal diagnosis of bipolar disorder.  Diagnosis:    Axis I:  1. Bipolar I disorder, most recent episode (or current) manic, moderate         Axis II: No diagnosis

## 2012-08-19 ENCOUNTER — Encounter (HOSPITAL_COMMUNITY): Payer: Self-pay | Admitting: Psychiatry

## 2012-08-19 ENCOUNTER — Ambulatory Visit (INDEPENDENT_AMBULATORY_CARE_PROVIDER_SITE_OTHER): Payer: Medicare Other | Admitting: Psychiatry

## 2012-08-19 VITALS — Wt 143.0 lb

## 2012-08-19 DIAGNOSIS — F319 Bipolar disorder, unspecified: Secondary | ICD-10-CM

## 2012-08-19 MED ORDER — GABAPENTIN 300 MG PO CAPS: ORAL_CAPSULE | ORAL | Status: DC

## 2012-08-19 MED ORDER — PAROXETINE HCL 40 MG PO TABS: 40.0000 mg | ORAL_TABLET | ORAL | Status: DC

## 2012-08-19 MED ORDER — TRAZODONE HCL 100 MG PO TABS: 100.0000 mg | ORAL_TABLET | Freq: Every day | ORAL | Status: DC

## 2012-08-19 NOTE — Progress Notes (Signed)
Chief complaint Medication management and followup.    History of present illness Patient is 61 year old recently widowed female came for her followup appointment.  She's compliant with her psychiatric medication.  She is taking vitamin D. Foster opinion.  She continued to have episodic depression and mood swing but overall she likes her current psychiatric medication.  She denies any agitation anger or any crying spells.  She sleeping better.  She denies any side effects of medication.  Her weight and appetite is unchanged from the past.  She's not drinking or using any illegal substance.  Current psychiatric medication Xanax 0.5 mg up to 3 times a day prescribed by PCP  Paxil 40 mg daily Trazodone 100 mg at bedtime Neurontin 300 mg 3 times a day and 3 at bedtime  Past psychiatric history Patient has a long history of depression and bipolar disorder started in 2003.  She has been seeing in this office since then.  She denies any history of suicidal attempt or any inpatient psychiatric treatment however endorse history of severe depression with passive suicidal thinking and anger issues.  She is seeing therapist in this office regularly.  Medical history Patient has history of fibromyalgia, arthritis and osteoarthritis. She see Belenda Cruise at [facility] office.  He last visit was 6 weeks ago.  She had Mammogram and Bone scan. She has osteopenia. Her liver enzymes and cholestenol were normal.   Mental status examination Patient is casually dressed and fairly groomed. She is calm cooperative and maintained good eye contact. Her speech is soft clear and coherent. Her thought process is logical linear and goal-directed. She denies active or passive suicidal thinking and homicidal thinking. She denies any auditory or visual hallucination. Her attention and concentration is fair. She described her mood is okay and her affect is mood congruent. There no psychotic symptoms present at  this time. She's alert and oriented x3. Her insight judgment and impulse control is okay.  Assessment Axis I bipolar disorder NOS Axis II deferred Axis III see medical history Axis IV mild to moderate  Plan I will continue her Paxil trazodone and Neurontin.  I explained risks and benefits of medication in detail.  I recommend to call us if she is a question or concern about the medication otherwise I will see her again in 3 months.

## 2012-09-10 ENCOUNTER — Ambulatory Visit (INDEPENDENT_AMBULATORY_CARE_PROVIDER_SITE_OTHER): Payer: Medicare Other | Admitting: Psychology

## 2012-09-10 DIAGNOSIS — F3112 Bipolar disorder, current episode manic without psychotic features, moderate: Secondary | ICD-10-CM

## 2012-09-11 ENCOUNTER — Encounter (HOSPITAL_COMMUNITY): Payer: Self-pay | Admitting: Psychology

## 2012-09-11 NOTE — Progress Notes (Signed)
Patient:  Katherine Middleton   DOB: Nov 11, 1951  MR Number: [id number]  Location: BEHAVIORAL [facility] [address] [place] [address] Dept: [phone]  Start: 1 PM End: 2 PM  Provider/Observer:     Hershal Coria PSYD  Chief Complaint:      Chief Complaint  Patient presents with  . Manic Behavior    Reason For Service:     The patient was referred for psychotherapeutic interventions in conjunction with ongoing psychiatric care. The patient has been diagnosed with bipolar disorder but has also had a number of medical issues including gastrointestinal problems, fibromyalgia, and a spastic colon.  Interventions Strategy:  Cognitive/behavioral psychotherapy  Participation Level:   Active  Participation Quality:  Appropriate      Behavioral Observation:  Well Groomed, Alert, and Appropriate.   Current Psychosocial Factors: The patient reports that the situation with her boyfriend and her visitation abilities has been resolved and she is now being able to see him on a regular basis. He will soon be through the halfway point of his incarceration and she is looking forward to getting through this a that can spend more time together. The patient reports that her son continues to live with her and it is positive in some ways for her but otherwise is a stressors he is going is on issues.  Content of Session:   Review current symptoms and continued to work on cognitive/behavioral therapeutic interventions.  Current Status:   The patient reports that she did have a rather mild manic episode a couple of weeks ago but that her mood has become more stable again she is back been able to maintain her focus better and doing better overall..  Patient Progress:   Very good  Target Goals:   Target goals include mood stability, better coping skills, keeping issues and symptoms of depression from becoming too problematic  and making situations when she becomes hypomanic or manic he from creating difficulties.  Last Reviewed:   09/10/2012  Goals Addressed Today:    T we continued to work on issues related to mood stability and better coping skills around recurrent depression and manic/hypomanic events..  Impression/Diagnosis:   The patient has a long-standing history of difficulties with anxiety and significant mood disturbance. She has been diagnosed with a formal diagnosis of bipolar disorder.  Diagnosis:    Axis I:  1. Bipolar I disorder, most recent episode (or current) manic, moderate         Axis II: No diagnosis

## 2012-09-17 ENCOUNTER — Telehealth (HOSPITAL_COMMUNITY): Payer: Self-pay | Admitting: Psychology

## 2012-09-18 NOTE — Telephone Encounter (Signed)
Phone call returned.  Son got DUI and patient was very upset.

## 2012-09-30 ENCOUNTER — Encounter (HOSPITAL_COMMUNITY): Payer: Self-pay | Admitting: Psychology

## 2012-09-30 NOTE — Progress Notes (Signed)
Patient:  Katherine Middleton   DOB: 1951-07-20  MR Number: [id number]  Location: BEHAVIORAL [facility] [address] [place] [address] Dept: [phone]  Start: 1 PM End: 2 PM  Provider/Observer:     Hershal Coria PSYD  Chief Complaint:      Chief Complaint  Patient presents with  . Anxiety  . Manic Behavior    Reason For Service:     The patient was referred for psychotherapeutic interventions in conjunction with ongoing psychiatric care. The patient has been diagnosed with bipolar disorder but has also had a number of medical issues including gastrointestinal problems, fibromyalgia, and a spastic colon.  Interventions Strategy:  Cognitive/behavioral psychotherapy  Participation Level:   Active  Participation Quality:  Appropriate      Behavioral Observation:  Well Groomed, Alert, and Appropriate.   Current Psychosocial Factors: The patient reports that she has been more stable lately and has been doing a good job keeping her medications straight. The patient reports that she has been able to start talking with her boyfriend again and this is then a major help for her..  Content of Session:   Review current symptoms and continued to work on cognitive/behavioral therapeutic interventions.  Current Status:   The patient reports that the manic phase that have been going on recently has stabilized and her mood has been much more stable and she has been actively working on coping skills..  Patient Progress:   Very good  Target Goals:   Target goals include mood stability, better coping skills, keeping issues and symptoms of depression from becoming too problematic and making situations when she becomes hypomanic or manic he from creating difficulties.  Last Reviewed:   08/01/2012  Goals Addressed Today:    T we continued to work on issues related to mood stability and better coping skills around  recurrent depression and manic/hypomanic events..  Impression/Diagnosis:   The patient has a long-standing history of difficulties with anxiety and significant mood disturbance. She has been diagnosed with a formal diagnosis of bipolar disorder.  Diagnosis:    Axis I:  1. Bipolar I disorder, most recent episode (or current) manic, moderate         Axis II: No diagnosis

## 2012-10-09 ENCOUNTER — Ambulatory Visit (INDEPENDENT_AMBULATORY_CARE_PROVIDER_SITE_OTHER): Payer: Medicare Other | Admitting: Psychology

## 2012-10-09 ENCOUNTER — Encounter (HOSPITAL_COMMUNITY): Payer: Self-pay | Admitting: Psychology

## 2012-10-09 DIAGNOSIS — F3131 Bipolar disorder, current episode depressed, mild: Secondary | ICD-10-CM

## 2012-10-09 NOTE — Progress Notes (Signed)
Patient:  Katherine Middleton   DOB: 1951-07-13  MR Number: [id number]  Location: BEHAVIORAL [facility] [address] [place] [address] Dept: [phone]  Start: 1 PM End: 2 PM  Provider/Observer:     Hershal Coria PSYD  Chief Complaint:      Chief Complaint  Patient presents with  . Stress  . Agitation  . Depression  . Anxiety    Reason For Service:     The patient was referred for psychotherapeutic interventions in conjunction with ongoing psychiatric care. The patient has been diagnosed with bipolar disorder but has also had a number of medical issues including gastrointestinal problems, fibromyalgia, and a spastic colon.  Interventions Strategy:  Cognitive/behavioral psychotherapy  Participation Level:   Active  Participation Quality:  Appropriate      Behavioral Observation:  Well Groomed, Alert, and Appropriate.   Current Psychosocial Factors: The patient's oldest son is continuing to live with her it is causing increasing stress as he this is causing increasing resentment about the situation.  We worked on this issue.  Content of Session:   Review current symptoms and continued to work on cognitive/behavioral therapeutic interventions.  Current Status:   The patient reports that she is in a more depressived phase of her symptoms and is likely due to the fall and typical changes for her .  Patient Progress:   Very good  Target Goals:   Target goals include mood stability, better coping skills, keeping issues and symptoms of depression from becoming too problematic and making situations when she becomes hypomanic or manic he from creating difficulties.  Last Reviewed:   10/09/2012  Goals Addressed Today:    T we continued to work on issues related to mood stability and better coping skills around recurrent depression and manic/hypomanic events..  Impression/Diagnosis:   The patient has a  long-standing history of difficulties with anxiety and significant mood disturbance. She has been diagnosed with a formal diagnosis of bipolar disorder.  Diagnosis:    Axis I:  1. Bipolar I disorder, most recent episode (or current) depressed, mild         Axis II: No diagnosis

## 2012-11-07 ENCOUNTER — Ambulatory Visit (INDEPENDENT_AMBULATORY_CARE_PROVIDER_SITE_OTHER): Payer: Medicare Other | Admitting: Psychology

## 2012-11-07 DIAGNOSIS — F3131 Bipolar disorder, current episode depressed, mild: Secondary | ICD-10-CM

## 2012-11-17 ENCOUNTER — Encounter (HOSPITAL_COMMUNITY): Payer: Self-pay | Admitting: Psychology

## 2012-11-17 NOTE — Progress Notes (Signed)
Patient:  Katherine Middleton   DOB: 06-Jul-1951  MR Number: [id number]  Location: BEHAVIORAL [facility] [address] [place] [address] Dept: [phone]  Start: 1 PM End: 2 PM  Provider/Observer:     Hershal Coria PSYD  Chief Complaint:      Chief Complaint  Patient presents with  . Anxiety  . Depression  . Agitation    Reason For Service:     The patient was referred for psychotherapeutic interventions in conjunction with ongoing psychiatric care. The patient has been diagnosed with bipolar disorder but has also had a number of medical issues including gastrointestinal problems, fibromyalgia, and a spastic colon.  Interventions Strategy:  Cognitive/behavioral psychotherapy  Participation Level:   Active  Participation Quality:  Appropriate      Behavioral Observation:  Well Groomed, Alert, and Appropriate.   Current Psychosocial Factors: The patient reports that she has had a lot of stress with her oldest son who had been living with her and still stays at her house much of the time. He has essentially been doing less and less to help around and she is becoming frustrated by the fact that he is taking his responsibility for his place to live as likely as he is. We worked on coping with these issues as this is a type of situation actively parental manic phase..  Content of Session:   Review current symptoms and continued to work on cognitive/behavioral therapeutic interventions.  Current Status:   The patient reports that she is in a more depressived phase of her symptoms and is likely due to the fall and typical changes for her .  Patient Progress:   Very good  Target Goals:   Target goals include mood stability, better coping skills, keeping issues and symptoms of depression from becoming too problematic and making situations when she becomes hypomanic or manic he from creating  difficulties.  Last Reviewed:   10/07/2012  Goals Addressed Today:    We continued to work on issues related to mood stability and better coping skills around recurrent depression and manic/hypomanic events..  Impression/Diagnosis:   The patient has a long-standing history of difficulties with anxiety and significant mood disturbance. She has been diagnosed with a formal diagnosis of bipolar disorder.  Diagnosis:    Axis I:  1. Bipolar I disorder, most recent episode (or current) depressed, mild         Axis II: No diagnosis

## 2012-11-18 ENCOUNTER — Ambulatory Visit (INDEPENDENT_AMBULATORY_CARE_PROVIDER_SITE_OTHER): Payer: Medicare Other | Admitting: Psychiatry

## 2012-11-18 ENCOUNTER — Encounter (HOSPITAL_COMMUNITY): Payer: Self-pay | Admitting: Psychiatry

## 2012-11-18 VITALS — BP 140/84 | HR 72 | Ht 64.0 in | Wt 142.6 lb

## 2012-11-18 DIAGNOSIS — M797 Fibromyalgia: Secondary | ICD-10-CM | POA: Insufficient documentation

## 2012-11-18 DIAGNOSIS — K219 Gastro-esophageal reflux disease without esophagitis: Secondary | ICD-10-CM | POA: Insufficient documentation

## 2012-11-18 DIAGNOSIS — F319 Bipolar disorder, unspecified: Secondary | ICD-10-CM | POA: Insufficient documentation

## 2012-11-18 MED ORDER — METHOCARBAMOL 500 MG PO TABS: 500.0000 mg | ORAL_TABLET | Freq: Four times a day (QID) | ORAL | Status: DC

## 2012-11-18 MED ORDER — TRAZODONE HCL 100 MG PO TABS: 100.0000 mg | ORAL_TABLET | Freq: Every day | ORAL | Status: DC

## 2012-11-18 MED ORDER — GABAPENTIN 300 MG PO CAPS: ORAL_CAPSULE | ORAL | Status: DC

## 2012-11-18 MED ORDER — PAROXETINE HCL 40 MG PO TABS: 40.0000 mg | ORAL_TABLET | ORAL | Status: DC

## 2012-11-18 MED ORDER — AMITRIPTYLINE HCL 10 MG PO TABS: 10.0000 mg | ORAL_TABLET | Freq: Every day | ORAL | Status: DC

## 2012-11-18 NOTE — Progress Notes (Signed)
Chief complaint Medication management and followup.    History of present illness Patient is 61 year old recently widowed female came for her followup appointment.  She is compliant with her meds.  She struggles with a combination of pain (fibromyalgia, arthritis, and osteoarthritis) anxiety, and depression.  Current psychiatric medication Xanax 0.5 mg up to 3 times a day prescribed by PCP  Paxil 40 mg daily Trazodone 100 mg at bedtime Neurontin 300 mg 3 times a day and 3 at bedtime  Past psychiatric history Patient has a long history of depression and bipolar disorder started in 2003.  She has been seeing in this office since then.  She denies any history of suicidal attempt or any inpatient psychiatric treatment however endorse history of severe depression with passive suicidal thinking and anger issues.  She is seeing therapist in this office regularly.  Medical history Patient has history of fibromyalgia, arthritis and osteoarthritis. She see Belenda Cruise at [facility] office.  He last visit was 6 weeks ago.  She had Mammogram and Bone scan. She has osteopenia. Her liver enzymes and cholestenol were normal.   Mental status examination Patient is casually dressed and fairly groomed. She is calm cooperative and maintained good eye contact. Her speech is soft clear and coherent. Her thought process is logical linear and goal-directed. She denies active or passive suicidal thinking and homicidal thinking. She denies any auditory or visual hallucination. Her attention and concentration is fair. She described her mood is okay and her affect is mood congruent. There no psychotic symptoms present at this time. She's alert and oriented x3. Her insight judgment and impulse control is okay.  Assessment Axis I bipolar disorder NOS Axis II deferred Axis III see medical history Axis IV mild to moderate  Plan I reviewed CC, tobacco/med/surg Hx, meds effects/ side effects,  problem list, therapies and responses as well as her tendency to be in relationships with alcoholics.  Agrees to explore [facility] Groups.  Interested in increasing the Neurontin for her pain management.  Also discussed Elavil for her fibromyalgia. Pt noting trouble focusing.  Understands that Xanax can cause that.  Discussed a shift to more Neurontin for the anxiety relieving effect from Xanax.

## 2012-11-18 NOTE — Patient Instructions (Addendum)
Could shift to more Neurontin from the Xanax because the xanax causes trouble with focus and clarity of thinking.  Neurontin helps anxiety and pain management.   Trying a small dose of Elavil might help getting to sleep.  It does increase the appetite for carbs.   Stopping smoking would help with your pain management.  Strongly consider attending at least 6 [facility] Meetings to help you learn about how your helping others to the exclusion of helping yourself is actually hurting yourself and is actually an addiction to fixing others and that you need to work the 12 Step to Happiness through the [employer]. [facility]-An[facility] Family Groups could be helpful with how to deal with substance abusing family and friends. Or your own issues of being in victim role.  There are only 40 [facility] Family Group meetings a week here in [place].  Online are current listing of those meetings @ [email or web address]  There are [employer].  Search on line and there you can learn the format and can access the schedule for yourself.  Their number is [phone]

## 2012-12-09 ENCOUNTER — Ambulatory Visit (HOSPITAL_COMMUNITY): Payer: Self-pay | Admitting: Psychology

## 2012-12-15 ENCOUNTER — Ambulatory Visit (INDEPENDENT_AMBULATORY_CARE_PROVIDER_SITE_OTHER): Payer: Medicare Other | Admitting: Psychology

## 2012-12-15 DIAGNOSIS — F419 Anxiety disorder, unspecified: Secondary | ICD-10-CM

## 2012-12-15 DIAGNOSIS — F3111 Bipolar disorder, current episode manic without psychotic features, mild: Secondary | ICD-10-CM

## 2012-12-15 DIAGNOSIS — F411 Generalized anxiety disorder: Secondary | ICD-10-CM

## 2012-12-16 ENCOUNTER — Encounter (HOSPITAL_COMMUNITY): Payer: Self-pay | Admitting: Psychology

## 2012-12-16 NOTE — Progress Notes (Signed)
Patient:  Katherine Middleton   DOB: Sep 21, 1951  MR Number: [id number]  Location: BEHAVIORAL [facility] [address] [place] [address] Dept: [phone]  Start: 2 PM End: 3 PM  Provider/Observer:     Hershal Coria PSYD  Chief Complaint:      Chief Complaint  Patient presents with  . Anxiety  . Agitation  . Stress    Reason For Service:     The patient was referred for psychotherapeutic interventions in conjunction with ongoing psychiatric care. The patient has been diagnosed with bipolar disorder but has also had a number of medical issues including gastrointestinal problems, fibromyalgia, and a spastic colon.  Interventions Strategy:  Cognitive/behavioral psychotherapy  Participation Level:   Active  Participation Quality:  Appropriate      Behavioral Observation:  Well Groomed, Alert, and Appropriate.   Current Psychosocial Factors: The patient reports that there've been a number of medication changes recently and that these are very difficult for her to handle. The patient reports that this is been very stressful for her and agreed to talk with her psychiatrist Dr. Dan Humphreys on Wednesday about this and depending on how well she was able to express or so we determine whether I talked with him.  Content of Session:   Review current symptoms and continued to work on cognitive/behavioral therapeutic interventions.  Current Status:   The patient reports that she is in a more hypomanic phase of her symptoms and is likely due to the current stressors she is feeling.  Patient Progress:   Very good  Target Goals:   Target goals include mood stability, better coping skills, keeping issues and symptoms of depression from becoming too problematic and making situations when she becomes hypomanic or manic he from creating difficulties.  Last Reviewed:   12/15/2012  Goals Addressed Today:    We continued to  work on issues related to mood stability and better coping skills around recurrent depression and manic/hypomanic events..  Impression/Diagnosis:   The patient has a long-standing history of difficulties with anxiety and significant mood disturbance. She has been diagnosed with a formal diagnosis of bipolar disorder.  Diagnosis:    Axis I:  1. Bipolar I disorder, most recent episode (or current) manic, mild   2. Anxiety         Axis II: No diagnosis

## 2012-12-17 ENCOUNTER — Encounter (HOSPITAL_COMMUNITY): Payer: Self-pay | Admitting: Psychiatry

## 2012-12-17 ENCOUNTER — Ambulatory Visit (INDEPENDENT_AMBULATORY_CARE_PROVIDER_SITE_OTHER): Payer: Medicare Other | Admitting: Psychiatry

## 2012-12-17 VITALS — Wt 139.8 lb

## 2012-12-17 DIAGNOSIS — M797 Fibromyalgia: Secondary | ICD-10-CM

## 2012-12-17 DIAGNOSIS — F419 Anxiety disorder, unspecified: Secondary | ICD-10-CM | POA: Insufficient documentation

## 2012-12-17 DIAGNOSIS — K219 Gastro-esophageal reflux disease without esophagitis: Secondary | ICD-10-CM

## 2012-12-17 DIAGNOSIS — F411 Generalized anxiety disorder: Secondary | ICD-10-CM

## 2012-12-17 DIAGNOSIS — F319 Bipolar disorder, unspecified: Secondary | ICD-10-CM

## 2012-12-17 DIAGNOSIS — E559 Vitamin D deficiency, unspecified: Secondary | ICD-10-CM

## 2012-12-17 MED ORDER — TRAZODONE HCL 150 MG PO TABS: 150.0000 mg | ORAL_TABLET | Freq: Every day | ORAL | Status: DC

## 2012-12-17 MED ORDER — CYCLOBENZAPRINE HCL 10 MG PO TABS: 10.0000 mg | ORAL_TABLET | Freq: Three times a day (TID) | ORAL | Status: DC

## 2012-12-17 NOTE — Patient Instructions (Signed)
Regular Flexeril at least twice a day helps reduce the compression on the spinal nerves and helps with pain which in turn helps the depression and anxiety   Have a happy holiday!

## 2012-12-17 NOTE — Progress Notes (Signed)
Chief complaint Chief Complaint  Patient presents with  . Follow-up  . Depression  . Manic Behavior  . Medication Refill   Subjective: "I just am getting over an intestinal virus.  The increase in Neurontin was good.  The Elavil was bad.  The Robaxin was too strong.  I didn't sleep that first week on all those meds".  History of present illness Patient is 61 year old recently widowed female came for her followup appointment.  Pt reports that she is compliant with some of the psychotropic medications with some benefit and considerable side effects.  As above.  The pt is not able to tolerate the Elavil.  I have marked her allergy as such.  THe Robaxin at 1/2 tab with meals was still too much.  She asks for Flexeril which has worked very well for her in the past.  The increase Neurontin did work, but 4 at [employer] was too much.  She has stopped the Benadryl that she was taking.  She is a natural at [employer].  She has searched for some videos on that and is expecting one soon. Her finger nails grow better when she takes the Vitamin D 50,000.   Will get a level.  Current psychiatric medication Xanax 0.5 mg mostly 3 times a day prescribed by PCP  Paxil 40 mg daily Trazodone 100 mg at bedtime, desiring a higher dose.  Robaxin 250 mg is too strong. Neurontin 300 mg 3 times a day and 3 at bedtime  Past psychiatric history Patient has a long history of depression and bipolar disorder started in 2003.  She has been seeing in this office since then.  She denies any history of suicidal attempt or any inpatient psychiatric treatment however endorse history of severe depression with passive suicidal thinking and anger issues.  She is seeing therapist in this office regularly.  Medical history Patient has history of fibromyalgia, arthritis and osteoarthritis. She see Belenda Cruise at [facility] office.  He last visit was 6 weeks ago.  She had Mammogram and Bone scan. She has osteopenia. Her  liver enzymes and cholestenol were normal.   Mental status examination Patient is casually dressed and fairly groomed. She is calm cooperative and maintained good eye contact. Her speech is soft clear and coherent. Her thought process is logical linear and goal-directed. She denies active or passive suicidal thinking and homicidal thinking. She denies any auditory or visual hallucination. Her attention and concentration is fair. She described her mood is okay and her affect is mood congruent. There no psychotic symptoms present at this time. She's alert and oriented x3. Her insight judgment and impulse control is okay.  Assessment Axis I bipolar disorder NOS, anxiety, Vitamin D deficiency by history Axis II deferred Axis III see medical history Axis IV mild to moderate  Plan Draw Vit D level, Increase Trazodone, Stop Elavil, Shift to Flexeril. I took her vitals.  I reviewed CC, tobacco/med/surg Hx, meds effects/ side effects, problem list, therapies and responses as well as current situation/symptoms discussed options. See orders and pt instructions for more details.

## 2012-12-18 ENCOUNTER — Telehealth (HOSPITAL_COMMUNITY): Payer: Self-pay | Admitting: Psychiatry

## 2012-12-18 LAB — VITAMIN D 25 HYDROXY (VIT D DEFICIENCY, FRACTURES): Vit D, 25-Hydroxy: 38 ng/mL (ref 30–89)

## 2012-12-18 NOTE — Telephone Encounter (Signed)
Phone message completed in the phone message section.  

## 2012-12-31 DIAGNOSIS — J329 Chronic sinusitis, unspecified: Secondary | ICD-10-CM

## 2012-12-31 HISTORY — DX: Chronic sinusitis, unspecified: J32.9

## 2013-01-16 ENCOUNTER — Ambulatory Visit (INDEPENDENT_AMBULATORY_CARE_PROVIDER_SITE_OTHER): Payer: Medicare Other | Admitting: Psychology

## 2013-01-16 ENCOUNTER — Encounter (HOSPITAL_COMMUNITY): Payer: Self-pay | Admitting: Psychology

## 2013-01-16 DIAGNOSIS — F3111 Bipolar disorder, current episode manic without psychotic features, mild: Secondary | ICD-10-CM

## 2013-01-16 DIAGNOSIS — F411 Generalized anxiety disorder: Secondary | ICD-10-CM

## 2013-01-16 DIAGNOSIS — F419 Anxiety disorder, unspecified: Secondary | ICD-10-CM

## 2013-01-16 NOTE — Progress Notes (Signed)
Patient:  Katherine Middleton   DOB: May 03, 1951  MR Number: [id number]  Location: BEHAVIORAL [facility] [address] [place] [address] Dept: [phone]  Start: 1 PM End: 2 PM  Provider/Observer:     Hershal Coria PSYD  Chief Complaint:      Chief Complaint  Patient presents with  . Anxiety  . Alcohol Problem  . Stress    Reason For Service:     The patient was referred for psychotherapeutic interventions in conjunction with ongoing psychiatric care. The patient has been diagnosed with bipolar disorder but has also had a number of medical issues including gastrointestinal problems, fibromyalgia, and a spastic colon.  Interventions Strategy:  Cognitive/behavioral psychotherapy  Participation Level:   Active  Participation Quality:  Appropriate      Behavioral Observation:  Well Groomed, Alert, and Appropriate.   Current Psychosocial Factors: The patient reports that her boyfriend beginning of his jail sentence in May and she is feeling more hopeful and optimistic about further in a relationship. The patient reports that her situation with her son Peyton Najjar has been good lately with the exception of in making his dog with her.I. behavioral problems. The patient reports that she has been working on finding a place for this dog with one of the neighbors.  Content of Session:   Review current symptoms and continued to work on cognitive/behavioral therapeutic interventions.  Current Status:   The patient reports that she has not been having any hypomanic episodes lately.  Patient Progress:   Very good  Target Goals:   Target goals include mood stability, better coping skills, keeping issues and symptoms of depression from becoming too problematic and making situations when she becomes hypomanic or manic he from creating difficulties.  Last Reviewed:   01/16/2013  Goals Addressed Today:    We continued to  work on issues related to mood stability and better coping skills around recurrent depression and manic/hypomanic events..  Impression/Diagnosis:   The patient has a long-standing history of difficulties with anxiety and significant mood disturbance. She has been diagnosed with a formal diagnosis of bipolar disorder.  Diagnosis:    Axis I:  1. Bipolar I disorder, most recent episode (or current) manic, mild   2. Anxiety         Axis II: No diagnosis

## 2013-02-03 ENCOUNTER — Encounter (HOSPITAL_COMMUNITY): Payer: Self-pay | Admitting: Psychiatry

## 2013-02-03 ENCOUNTER — Ambulatory Visit (INDEPENDENT_AMBULATORY_CARE_PROVIDER_SITE_OTHER): Payer: Medicare Other | Admitting: Psychiatry

## 2013-02-03 ENCOUNTER — Telehealth (HOSPITAL_COMMUNITY): Payer: Self-pay | Admitting: Psychiatry

## 2013-02-03 VITALS — Wt 141.8 lb

## 2013-02-03 DIAGNOSIS — F319 Bipolar disorder, unspecified: Secondary | ICD-10-CM

## 2013-02-03 DIAGNOSIS — M797 Fibromyalgia: Secondary | ICD-10-CM

## 2013-02-03 DIAGNOSIS — F419 Anxiety disorder, unspecified: Secondary | ICD-10-CM

## 2013-02-03 DIAGNOSIS — E559 Vitamin D deficiency, unspecified: Secondary | ICD-10-CM

## 2013-02-03 DIAGNOSIS — F411 Generalized anxiety disorder: Secondary | ICD-10-CM

## 2013-02-03 NOTE — Patient Instructions (Signed)
Keep the Vitamin D replacement going and the Flexeril going.  Controlling your pain is optimal for your control of anxiety and depression.  Relaxation is the ultimate solution for you.  You can seek it through tub baths, bubble baths, essential oils or incense, walking or chatting with friends, listening to soft music, watching a candle burn and just letting all thoughts go and appreciating the true essence of the Creator.   Yoga is a very helpful exercise method.  On TV or on line [employer] is a source of high quality information about yoga and videos on yoga.  Renee Ramus is the world's number one video yoga instructor according to some experts.  There are exceptional health benefits that can be achieved through yoga.  The main principles of yoga is acceptance, no competition, no comparison, and no judgement.  It is exceptional in helping people meditate and get to a very relaxed state.   Call if problems or concerns.

## 2013-02-03 NOTE — Progress Notes (Signed)
[facility] [address] Progress Note DANUTA HUSEMAN MRN: [id number] DOB: [birth year] Age: 62 y.o.  Date: 02/03/2013 Start Time: 2:40 PM End Time: 3:05 PM  Chief Complaint: Chief Complaint  Patient presents with  . Depression  . Follow-up  . Medication Refill    Subjective: "I tried the Neurontin before the Xanax and it helped.  The Flexeril cost me $50 for 3 months this time". Depression 0/10 and Anxiety 5/10, where 1 is the best and 10 is the worst.  Pain today is worse than it has been about 5 to 6/10.  History of present illness Patient is 62 year old recently widowed female came for her followup appointment.  Pt reports that she is compliant with the psychotropic medications with good benefit and some side effects.  She notes some increased grogginess with the increase of the Trazdone to 150 mg.  Discussed how she could split the dose or take it earlier to see if that helps the AM grogginess.  She noted some good results with getting to sleep with Melatonin 3 mg in the afternoon.   Vitamin D is a little below the level that is optimal.  Will keep the 50,000 going.   Current psychiatric medication Xanax 0.5 mg mostly 3 times a day prescribed by PCP  Paxil 40 mg daily Trazodone 100 mg at bedtime, desiring a higher dose.  Robaxin 250 mg is too strong. Neurontin 300 mg 3 times a day and 3 at bedtime  Past psychiatric history Patient has a long history of depression and bipolar disorder started in 2003.  She has been seeing in this office since then.  She denies any history of suicidal attempt or any inpatient psychiatric treatment however endorse history of severe depression with passive suicidal thinking and anger issues.  She is seeing therapist in this office regularly.  Medical history Patient has history of fibromyalgia, arthritis and osteoarthritis. She see Belenda Cruise at [facility] office.  He last visit was 6 weeks ago.  She had Mammogram and Bone  scan. She has osteopenia. Her liver enzymes and cholestenol were normal.   Family History family history includes ADD / ADHD in her others; Alcohol abuse in her maternal uncle and paternal uncle; Anxiety disorder in her mother and sister; Bipolar disorder in her father and sister; Dementia (age of onset:74) in her sister; Paranoid behavior in her sister; and Schizophrenia in her father.  There is no history of OCD, and Seizures, and Sexual abuse, and Physical abuse, .  Mental status examination Patient is casually dressed and fairly groomed. She is calm cooperative and maintained good eye contact. Her speech is soft clear and coherent. Her thought process is logical linear and goal-directed. She denies active or passive suicidal thinking and homicidal thinking. She denies any auditory or visual hallucination. Her attention and concentration is fair. She described her mood is okay and her affect is mood congruent. There no psychotic symptoms present at this time. She's alert and oriented x3. Her insight judgment and impulse control is okay.  Lab Results:  Recent Results (from the past 8736 hour(s))  VITAMIN D 25 HYDROXY   Collection Time   12/17/12  2:34 PM      Component Value Range   Vit D, 25-Hydroxy 38  30 - 89 ng/mL   Assessment Axis I bipolar disorder NOS, anxiety, Vitamin D deficiency by history Axis II deferred Axis III see medical history Axis IV mild to moderate  Plan: I took her vitals.  I reviewed  CC, tobacco/med/surg Hx, meds effects/ side effects, problem list, therapies and responses as well as current situation/symptoms discussed options. See orders and pt instructions for more details.  Medical Decision Making Problem Points:  Established problem, stable/improving (1), Review of last therapy session (1) and Review of psycho-social stressors (1) Data Points:  Review or order clinical lab tests (1) Review of medication regiment & side effects (2)  I certify that  outpatient services furnished can reasonably be expected to improve the patient's condition.   Orson Aloe, MD, [facility]

## 2013-02-04 MED ORDER — PAROXETINE HCL 40 MG PO TABS: 40.0000 mg | ORAL_TABLET | ORAL | Status: DC

## 2013-02-04 NOTE — Telephone Encounter (Signed)
Refill request approved via eScripts.  

## 2013-02-06 ENCOUNTER — Telehealth (HOSPITAL_COMMUNITY): Payer: Self-pay | Admitting: Psychiatry

## 2013-02-06 DIAGNOSIS — F319 Bipolar disorder, unspecified: Secondary | ICD-10-CM

## 2013-02-06 MED ORDER — PAROXETINE HCL 40 MG PO TABS: 40.0000 mg | ORAL_TABLET | ORAL | Status: DC

## 2013-02-06 NOTE — Telephone Encounter (Signed)
Script sent by escript and printed script from the 5th destroyed

## 2013-02-14 ENCOUNTER — Other Ambulatory Visit: Payer: Self-pay

## 2013-02-17 ENCOUNTER — Encounter (HOSPITAL_COMMUNITY): Payer: Self-pay | Admitting: Psychology

## 2013-02-17 ENCOUNTER — Ambulatory Visit (INDEPENDENT_AMBULATORY_CARE_PROVIDER_SITE_OTHER): Payer: Medicare Other | Admitting: Psychology

## 2013-02-17 DIAGNOSIS — F3111 Bipolar disorder, current episode manic without psychotic features, mild: Secondary | ICD-10-CM

## 2013-02-17 DIAGNOSIS — F411 Generalized anxiety disorder: Secondary | ICD-10-CM

## 2013-02-17 NOTE — Progress Notes (Signed)
Patient:  Katherine [place]   DOB: 14-Jun-1951  MR Number: [id number]  Location: BEHAVIORAL [facility] [address] [place] [address] Dept: [phone]  Start: 1 PM End: 2 PM  Provider/Observer:     Hershal Coria PSYD  Chief Complaint:      Chief Complaint  Patient presents with  . Anxiety  . Depression  . Stress    Reason For Service:     The patient was referred for psychotherapeutic interventions in conjunction with ongoing psychiatric care. The patient has been diagnosed with bipolar disorder but has also had a number of medical issues including gastrointestinal problems, fibromyalgia, and a spastic colon.  Interventions Strategy:  Cognitive/behavioral psychotherapy  Participation Level:   Active  Participation Quality:  Appropriate      Behavioral Observation:  Well Groomed, Alert, and Appropriate.   Current Psychosocial Factors: The patient is getting leg cyst as the time approaches where her boyfriend will be getting out of prison. While she is actively looking forward to seeing you again they have been apart for almost a year and this is causing her a great deal of stress. The patient reports that her youngest son is doing quite well in [place] and that she is expecting news been undergoing regarding his relationship tear..  Content of Session:   Review current symptoms and continued to work on cognitive/behavioral therapeutic interventions.  Current Status:   The patient reports that she has not been having any hypomanic episodes lately.  Patient Progress:   Very good  Target Goals:   Target goals include mood stability, better coping skills, keeping issues and symptoms of depression from becoming too problematic and making situations when she becomes hypomanic or manic he from creating difficulties.  Last Reviewed:   02/17/2013  Goals Addressed Today:    We continued to work on issues  related to mood stability and better coping skills around recurrent depression and manic/hypomanic events..  Impression/Diagnosis:   The patient has a long-standing history of difficulties with anxiety and significant mood disturbance. She has been diagnosed with a formal diagnosis of bipolar disorder.  Diagnosis:    Axis I:  Bipolar I disorder, most recent episode (or current) manic, mild  Anxiety state, unspecified      Axis II: No diagnosis

## 2013-03-17 ENCOUNTER — Ambulatory Visit (INDEPENDENT_AMBULATORY_CARE_PROVIDER_SITE_OTHER): Payer: Medicare Other | Admitting: Psychology

## 2013-03-17 DIAGNOSIS — F3111 Bipolar disorder, current episode manic without psychotic features, mild: Secondary | ICD-10-CM

## 2013-03-17 DIAGNOSIS — F411 Generalized anxiety disorder: Secondary | ICD-10-CM

## 2013-04-03 ENCOUNTER — Encounter (HOSPITAL_COMMUNITY): Payer: Self-pay | Admitting: Psychiatry

## 2013-04-03 ENCOUNTER — Ambulatory Visit (INDEPENDENT_AMBULATORY_CARE_PROVIDER_SITE_OTHER): Payer: Medicare Other | Admitting: Psychiatry

## 2013-04-03 VITALS — Wt 142.2 lb

## 2013-04-03 DIAGNOSIS — F319 Bipolar disorder, unspecified: Secondary | ICD-10-CM

## 2013-04-03 DIAGNOSIS — M549 Dorsalgia, unspecified: Secondary | ICD-10-CM | POA: Insufficient documentation

## 2013-04-03 DIAGNOSIS — M797 Fibromyalgia: Secondary | ICD-10-CM

## 2013-04-03 DIAGNOSIS — G8929 Other chronic pain: Secondary | ICD-10-CM | POA: Insufficient documentation

## 2013-04-03 DIAGNOSIS — F411 Generalized anxiety disorder: Secondary | ICD-10-CM

## 2013-04-03 MED ORDER — TRAZODONE HCL 150 MG PO TABS: 150.0000 mg | ORAL_TABLET | Freq: Every day | ORAL | Status: DC

## 2013-04-03 MED ORDER — BACLOFEN 10 MG PO TABS: 10.0000 mg | ORAL_TABLET | Freq: Three times a day (TID) | ORAL | Status: DC

## 2013-04-03 MED ORDER — PAROXETINE HCL 40 MG PO TABS: 40.0000 mg | ORAL_TABLET | ORAL | Status: DC

## 2013-04-03 MED ORDER — GABAPENTIN 300 MG PO CAPS: ORAL_CAPSULE | ORAL | Status: DC

## 2013-04-03 NOTE — Addendum Note (Signed)
Addended by: Mike Craze on: 04/03/2013 01:42 PM   Modules accepted: Orders

## 2013-04-03 NOTE — Progress Notes (Signed)
[facility] [address] Progress Note Katherine [place] MRN: [id number] DOB: 1951/09/27 Age: 62 y.o.  Date: 04/03/2013 Start Time: 1:14 PM End Time: 1:38 PM  Chief Complaint: Chief Complaint  Patient presents with  . Depression  . Follow-up  . Medication Refill    Subjective: "I'm doing fine.  I have had some sharp pains in my feet from time to time.". Depression 1/10 and Anxiety 5210, where 1 is the best and 10 is the worst.  Pain today is 4/10, not as bad as it has been.  History of present illness Patient is 62 year old recently widowed female came for her followup appointment.  Pt reports that she is compliant with the psychotropic medications with good benefit and some side effects.  Pt has noted improved finger nail strength with the Vitamin D replacement.  She has noted dry mouth, but has gotten relief with Trident sugar free.    Flexeril helps her back some, not as well as the Robaxin, but the copay is too much.  Will try Baclofen.  Will give her more Neurontin as it does help her pain and some of her anxiety.  She has been walking some with no noted benefit.  Current psychiatric medication Xanax 0.5 mg mostly 3 times a day prescribed by PCP  Paxil 40 mg daily Trazodone 100 mg at bedtime, desiring a higher dose.  Flexeril 10 mg three times a day was $50 copay. Neurontin 300 mg 1 in AM and miod morning and 2 in the afternoon with 3 at bedtime Vitamin D 50,000 units each week  Past psychiatric history Patient has a long history of depression and bipolar disorder started in 2003.  She has been seeing in this office since then.  She denies any history of suicidal attempt or any inpatient psychiatric treatment however endorse history of severe depression with passive suicidal thinking and anger issues.  She is seeing therapist in this office regularly.  Medical history Patient has history of fibromyalgia, arthritis and osteoarthritis. She see Belenda Cruise at [employer] [employer] office.  He last visit was 6 weeks ago.  She had Mammogram and Bone scan. She has osteopenia. Her liver enzymes and cholestenol were normal.   Family History family history includes ADD / ADHD in her others; Alcohol abuse in her maternal uncle and paternal uncle; Anxiety disorder in her mother and sister; Bipolar disorder in her father and sister; Dementia (age of onset: 60) in her sister; Healthy in her other; Paranoid behavior in her sister; and Schizophrenia in her father.  There is no history of OCD, and Seizures, and Sexual abuse, and Physical abuse, .  Mental status examination Patient is casually dressed and fairly groomed. She is calm cooperative and maintained good eye contact. Her speech is soft clear and coherent. Her thought process is logical linear and goal-directed. She denies active or passive suicidal thinking and homicidal thinking. She denies any auditory or visual hallucination. Her attention and concentration is fair. She described her mood is okay and her affect is mood congruent. There no psychotic symptoms present at this time. She's alert and oriented x3. Her insight judgment and impulse control is okay.  Lab Results:  Results for orders placed in visit on 12/17/12 (from the past 8736 hour(s))  VITAMIN D 25 HYDROXY   Collection Time    12/17/12  2:34 PM      Result Value Range   Vit D, 25-Hydroxy 38  30 - 89 ng/mL   Assessment Axis I bipolar  disorder NOS, anxiety, Vitamin D deficiency by history Axis II deferred Axis III see medical history Axis IV mild to moderate  Plan: I took her vitals.  I reviewed CC, tobacco/med/surg Hx, meds effects/ side effects, problem list, therapies and responses as well as current situation/symptoms discussed options. See orders and pt instructions for more details.  MEDICATIONS this encounter: No orders of the defined types were placed in this encounter.    Medical Decision Making Problem Points:   Established problem, stable/improving (1), Review of last therapy session (1) and Review of psycho-social stressors (1) Data Points:  Review or order clinical lab tests (1) Review of medication regiment & side effects (2)  I certify that outpatient services furnished can reasonably be expected to improve the patient's condition.   Orson Aloe, MD, [facility]

## 2013-04-03 NOTE — Patient Instructions (Signed)
Set a timer for 8 minutes and walk for that amount of time in the house or in the yard.  Mark "8" on a calendar for that day.  Do that every day this week.  Then next week increase the time to 9 minutes and then mark the calendar with a 9 for that day.  Each week increase your exercise by one minute.  Keep a record of this so you can see what progress you are making.  Do this every day, just like eating and sleeping.  It is good for pain control, depression, and for your soul/spirit.  Bring the record in for your next visit so we can talk about your effort and how you feel with the new exercise program going and working for you.  Take care of yourself.  No one else is standing up to do the job and only you know what you need.   GET SERIOUS about taking care of yourself.  Do the next right thing and that often means doing something to care for yourself along the lines of are you hungry, are you angry, are you lonely, are you tired, are you scared?  HALTS is what that stands for.  Call if problems or concerns.  

## 2013-04-06 ENCOUNTER — Encounter (HOSPITAL_COMMUNITY): Payer: Self-pay | Admitting: Psychology

## 2013-04-06 NOTE — Progress Notes (Signed)
Patient:  Katherine Middleton   DOB: [birth year]  MR Number: [id number]  Location: BEHAVIORAL [facility]- [address] [place] [address] Dept: [phone]  Start: 1 PM End: 2 PM  Provider/Observer:     Hershal Coria PSYD  Chief Complaint:      Chief Complaint  Patient presents with  . Anxiety  . Agitation  . Stress    Reason For Service:     The patient was referred for psychotherapeutic interventions in conjunction with ongoing psychiatric care. The patient has been diagnosed with bipolar disorder but has also had a number of medical issues including gastrointestinal problems, fibromyalgia, and a spastic colon.  Interventions Strategy:  Cognitive/behavioral psychotherapy  Participation Level:   Active  Participation Quality:  Appropriate      Behavioral Observation:  Well Groomed, Alert, and Appropriate.   Current Psychosocial Factors: The patient is getting leg cyst as the time approaches where her boyfriend will be getting out of prison. While she is actively looking forward to seeing you again they have been apart for almost a year and this is causing her a great deal of stress. The patient reports that her youngest son is doing quite well in [place] and that she is expecting news been undergoing regarding his relationship tear..  Content of Session:   Review current symptoms and continued to work on cognitive/behavioral therapeutic interventions.  Current Status:   The patient reports that she has not been having any hypomanic episodes lately.  Patient Progress:   Very good  Target Goals:   Target goals include mood stability, better coping skills, keeping issues and symptoms of depression from becoming too problematic and making situations when she becomes hypomanic or manic he from creating difficulties.  Last Reviewed:   03/17/2013  Goals Addressed Today:    We continued to work on issues  related to mood stability and better coping skills around recurrent depression and manic/hypomanic events..  Impression/Diagnosis:   The patient has a long-standing history of difficulties with anxiety and significant mood disturbance. She has been diagnosed with a formal diagnosis of bipolar disorder.  Diagnosis:    Axis I:  Bipolar I disorder, most recent episode (or current) manic, mild  Anxiety state, unspecified      Axis II: No diagnosis

## 2013-04-16 ENCOUNTER — Ambulatory Visit (HOSPITAL_COMMUNITY): Payer: Self-pay | Admitting: Psychology

## 2013-04-23 ENCOUNTER — Ambulatory Visit (HOSPITAL_COMMUNITY): Payer: Self-pay | Admitting: Psychology

## 2013-05-01 ENCOUNTER — Encounter (HOSPITAL_COMMUNITY): Payer: Self-pay | Admitting: Psychiatry

## 2013-05-01 ENCOUNTER — Ambulatory Visit (INDEPENDENT_AMBULATORY_CARE_PROVIDER_SITE_OTHER): Payer: Medicare Other | Admitting: Psychiatry

## 2013-05-01 VITALS — BP 123/55 | HR 69 | Ht 64.25 in | Wt 136.6 lb

## 2013-05-01 DIAGNOSIS — F419 Anxiety disorder, unspecified: Secondary | ICD-10-CM

## 2013-05-01 DIAGNOSIS — F319 Bipolar disorder, unspecified: Secondary | ICD-10-CM

## 2013-05-01 DIAGNOSIS — G8929 Other chronic pain: Secondary | ICD-10-CM

## 2013-05-01 DIAGNOSIS — F411 Generalized anxiety disorder: Secondary | ICD-10-CM

## 2013-05-01 DIAGNOSIS — E559 Vitamin D deficiency, unspecified: Secondary | ICD-10-CM

## 2013-05-01 DIAGNOSIS — M797 Fibromyalgia: Secondary | ICD-10-CM

## 2013-05-01 DIAGNOSIS — M549 Dorsalgia, unspecified: Secondary | ICD-10-CM

## 2013-05-01 MED ORDER — GABAPENTIN 300 MG PO CAPS: ORAL_CAPSULE | ORAL | Status: DC

## 2013-05-01 MED ORDER — PAROXETINE HCL 40 MG PO TABS
40.0000 mg | ORAL_TABLET | ORAL | Status: DC
Start: 2013-05-01 — End: 2013-07-30

## 2013-05-01 MED ORDER — TRAZODONE HCL 150 MG PO TABS: 150.0000 mg | ORAL_TABLET | Freq: Every day | ORAL | Status: DC

## 2013-05-01 NOTE — Patient Instructions (Signed)
Keep the walking up.  Take care of yourself.  No one else is standing up to do the job and only you know what you need.   GET SERIOUS about taking care of yourself.  Do the next right thing and that often means doing something to care for yourself along the lines of are you hungry, are you angry, are you lonely, are you tired, are you scared?  HALTS is what that stands for.  Call if problems or concerns.

## 2013-05-01 NOTE — Progress Notes (Signed)
[facility] [place] [address] Progress Note Katherine [place] MRN: [id number] DOB: 1951/02/23 Age: 61 y.o.  Date: 05/01/2013 Start Time: 1:04 PM End Time: 1:21 PM  Chief Complaint: Chief Complaint  Patient presents with  . Depression  . Follow-up  . Medication Refill    Subjective: "I'm doing pretty good.  I have some visitors expected.  The muscle relaxer did not work.  My mania kicked in and I was flying". Depression 2/10 and Anxiety 7/10, where 1 is the best and 10 is the worst.  Pain today is 4/10, not as bad as it has been.  Her back is the source of her pain.  History of present illness Patient is 62 year old recently widowed female came for her followup appointment.  Pt reports that she is compliant with the psychotropic medications with good benefit and some side effects.  Apparently her diarrhea prevented her from taking the evening meds.  She has been walking some with no noted benefit.  LIFESTYLE change: has started walking some.  Current psychiatric medication Xanax 0.5 mg mostly 3 times a day prescribed by PCP  Paxil 40 mg daily Trazodone 100 mg at bedtime, desiring a higher dose.  Baclofen stopped anxiety worse. Flexeril 10 mg three times a day was $50 copay. Neurontin 300 mg 1 in AM and mid morning and 2 in the afternoon with 3 at bedtime Vitamin D 50,000 units each week Current Outpatient Prescriptions  Medication Sig Dispense Refill  . gabapentin (NEURONTIN) 300 MG capsule Take by mouth 300 to 600 mg up to four times a day for anxiety and pain management,keep taking 900 mg at night for pain anxiety and insomnia  330 capsule  1  . PARoxetine (PAXIL) 40 MG tablet Take 1 tablet (40 mg total) by mouth every morning.  90 tablet  0  . traZODone (DESYREL) 150 MG tablet Take 1 tablet (150 mg total) by mouth at bedtime.  90 tablet  0  . Vitamin D, Ergocalciferol, (DRISDOL) 50000 UNITS CAPS Take 50,000 Units by mouth every Wednesday.      . ALPRAZolam (XANAX) 0.5 MG tablet  Take 0.5 mg by mouth 3 (three) times daily as needed.        . baclofen (LIORESAL) 10 MG tablet Take 1 tablet (10 mg total) by mouth 3 (three) times daily.  90 tablet  1  . cetirizine (ZYRTEC) 5 MG tablet Take 5 mg by mouth daily.        . ranitidine (ZANTAC) 150 MG tablet Take 150 mg by mouth 2 (two) times daily.         No current facility-administered medications for this visit.    Past psychiatric history Patient has a long history of depression and bipolar disorder started in 2003.  She has been seeing in this office since then.  She denies any history of suicidal attempt or any inpatient psychiatric treatment however endorse history of severe depression with passive suicidal thinking and anger issues.  She is seeing therapist in this office regularly.  Allergies: Allergies  Allergen Reactions  . Codeine Itching  . Cymbalta (Duloxetine Hcl) Other (See Comments)    GERD  . Oxycodone Itching and Other (See Comments)    "OUT THERE", climbing the walls  . Baclofen Other (See Comments)    Anxiety got much worse and possibly ppted manic episode  . Betadine (Povidone Iodine) Itching    Particularly if in a douche   . Penicillins Swelling  . Sulfa Antibiotics Swelling  . Aspirin  Hives and Rash  . Elavil (Amitriptyline) Other (See Comments)    Caused bowels to empty quickly and couldn't sleep on it.  . Latex Other (See Comments)    If in mouth her mouth swells  . Nsaids Nausea Only and Other (See Comments)    Almost passing out, bad acid reflux   Medical History: Past Medical History  Diagnosis Date  . Fibromyalgia   . Arthritis   . Osteoarthritis   . Bipolar disorder   . GERD (gastroesophageal reflux disease) 12/31/2002  . Sinusitis 12/31/2012   Surgical History: Past Surgical History  Procedure Laterality Date  . Tonsillectomy  04/08/57    some date around then  . Tubal ligation  10/21/1984   Family History family history includes ADD / ADHD in her others; Alcohol abuse in  her maternal uncle and paternal uncle; Anxiety disorder in her mother and sister; Bipolar disorder in her father and sister; Dementia (age of onset: 91) in her sister; Healthy in her other; Paranoid behavior in her sister; and Schizophrenia in her father.  There is no history of OCD, and Seizures, and Sexual abuse, and Physical abuse, .  Vitals: BP 123/55  Pulse 69  Ht 5' 4.25" (1.632 m)  Wt 136 lb 9.6 oz (61.961 kg)  BMI 23.26 kg/m2 Wt loss 8 pounds from last visit.  Mental status examination Patient is casually dressed and fairly groomed. She is calm cooperative and maintained good eye contact. Her speech is soft clear and coherent. Her thought process is logical linear and goal-directed. She denies active or passive suicidal thinking and homicidal thinking. She denies any auditory or visual hallucination. Her attention and concentration is fair. She described her mood is okay and her affect is mood congruent. There no psychotic symptoms present at this time. She's alert and oriented x3. Her insight judgment and impulse control is okay.  Lab Results:  Results for orders placed in visit on 12/17/12 (from the past 8736 hour(s))  VITAMIN D 25 HYDROXY   Collection Time    12/17/12  2:34 PM      Result Value Range   Vit D, 25-Hydroxy 38  30 - 89 ng/mL   Assessment Axis I bipolar disorder NOS, anxiety, Vitamin D deficiency by history Axis II deferred Axis III see medical history Axis IV mild to moderate  Plan: I took her vitals.  I reviewed CC, tobacco/med/surg Hx, meds effects/ side effects, problem list, therapies and responses as well as current situation/symptoms discussed options. Stop baclofen, return to flexeril for back spasms, cont other effective meds.  See orders and pt instructions for more details.  MEDICATIONS this encounter: No orders of the defined types were placed in this encounter.    Medical Decision Making Problem Points:  Established problem, stable/improving  (1), Review of last therapy session (1) and Review of psycho-social stressors (1) Data Points:  Review or order clinical lab tests (1) Review of medication regiment & side effects (2)  I certify that outpatient services furnished can reasonably be expected to improve the patient's condition.   Orson Aloe, MD, [facility]

## 2013-05-05 ENCOUNTER — Ambulatory Visit (INDEPENDENT_AMBULATORY_CARE_PROVIDER_SITE_OTHER): Payer: Medicare Other | Admitting: Psychology

## 2013-05-05 DIAGNOSIS — F3111 Bipolar disorder, current episode manic without psychotic features, mild: Secondary | ICD-10-CM

## 2013-05-05 DIAGNOSIS — F411 Generalized anxiety disorder: Secondary | ICD-10-CM

## 2013-05-13 ENCOUNTER — Encounter (HOSPITAL_COMMUNITY): Payer: Self-pay | Admitting: Psychology

## 2013-05-13 NOTE — Progress Notes (Signed)
Patient:  Katherine Middleton   DOB: 07-22-1951  MR Number: [id number]  Location: BEHAVIORAL [facility] [address] [place] [address] Dept: [phone]  Start: 1 PM End: 2 PM  Provider/Observer:     Hershal Coria PSYD  Chief Complaint:      Chief Complaint  Patient presents with  . Agitation  . Anxiety  . Stress    Reason For Service:     The patient was referred for psychotherapeutic interventions in conjunction with ongoing psychiatric care. The patient has been diagnosed with bipolar disorder but has also had a number of medical issues including gastrointestinal problems, fibromyalgia, and a spastic colon.  Interventions Strategy:  Cognitive/behavioral psychotherapy  Participation Level:   Active  Participation Quality:  Appropriate      Behavioral Observation:  Well Groomed, Alert, and Appropriate.   Current Psychosocial Factors: The patient reports that she has been getting very excited by the fact that her boyfriend will be getting out of jail setting. She reports that this has caused her to have more pain and she has been trying to get the house cleaned up for his arrival. The patient reports that she is also expecting to have her son come for a visit from [place] this summer and she has been working on [employer] as well .  Content of Session:   Review current symptoms and continued to work on cognitive/behavioral therapeutic interventions.  Current Status:   the patient reports that she did have some mild hypomanic symptoms between her last visit and today. She reports that she knows what it is about as she is been looking forward to her boyfriend getting out of jail after over a year and her son coming to visit from [place].  Patient Progress:   Very good  Target Goals:   Target goals include mood stability, better coping skills, keeping issues and symptoms of depression from becoming too  problematic and making situations when she becomes hypomanic or manic he from creating difficulties.  Last Reviewed:   05/05/2013  Goals Addressed Today:    We continued to work on issues related to mood stability and better coping skills around recurrent depression and manic/hypomanic events..  Impression/Diagnosis:   The patient has a long-standing history of difficulties with anxiety and significant mood disturbance. She has been diagnosed with a formal diagnosis of bipolar disorder.  Diagnosis:    Axis I:  Bipolar I disorder, most recent episode (or current) manic, mild  Anxiety state, unspecified      Axis II: No diagnosis

## 2013-06-02 ENCOUNTER — Ambulatory Visit (INDEPENDENT_AMBULATORY_CARE_PROVIDER_SITE_OTHER): Payer: Medicare Other | Admitting: Psychology

## 2013-06-02 DIAGNOSIS — F3111 Bipolar disorder, current episode manic without psychotic features, mild: Secondary | ICD-10-CM

## 2013-06-02 DIAGNOSIS — F411 Generalized anxiety disorder: Secondary | ICD-10-CM

## 2013-06-12 ENCOUNTER — Encounter (HOSPITAL_COMMUNITY): Payer: Self-pay | Admitting: Psychology

## 2013-06-12 NOTE — Progress Notes (Signed)
Patient:  Katherine Middleton   DOB: 1951/09/19  MR Number: [id number]  Location: BEHAVIORAL [facility]- [address] [place] [address] Dept: [phone]  Start: 1 PM End: 2 PM  Provider/Observer:     Hershal Coria PSYD  Chief Complaint:      Chief Complaint  Patient presents with  . Agitation  . Stress    Reason For Service:     The patient was referred for psychotherapeutic interventions in conjunction with ongoing psychiatric care. The patient has been diagnosed with bipolar disorder but has also had a number of medical issues including gastrointestinal problems, fibromyalgia, and a spastic colon.  Interventions Strategy:  Cognitive/behavioral psychotherapy  Participation Level:   Active  Participation Quality:  Appropriate      Behavioral Observation:  Well Groomed, Alert, and Appropriate.   Current Psychosocial Factors: The patient reports that she has been rather stressed recently but feels like it is a good way. The patient reports that her boyfriend has been released from prison and is now staying at her house. This is been accepted by her oldest son but her youngest son who lives in [place] is having some difficulty with .  Content of Session:   Review current symptoms and continued to work on cognitive/behavioral therapeutic interventions.  Current Status:   the patient reports that she did have some mild hypomanic symptoms between her last visit and today. She reports that she knows what it is about as she is been looking forward to her boyfriend getting out of jail after over a year and her son coming to visit from [place].  Patient Progress:   Very good  Target Goals:   Target goals include mood stability, better coping skills, keeping issues and symptoms of depression from becoming too problematic and making situations when she becomes hypomanic or manic he from creating  difficulties.  Last Reviewed:   06/02/2013  Goals Addressed Today:    We continued to work on issues related to mood stability and better coping skills around recurrent depression and manic/hypomanic events..  Impression/Diagnosis:   The patient has a long-standing history of difficulties with anxiety and significant mood disturbance. She has been diagnosed with a formal diagnosis of bipolar disorder.  Diagnosis:    Axis I:  Bipolar I disorder, most recent episode (or current) manic, mild  Anxiety state, unspecified      Axis II: No diagnosis

## 2013-07-07 ENCOUNTER — Ambulatory Visit (HOSPITAL_COMMUNITY): Payer: Self-pay | Admitting: Psychology

## 2013-07-23 ENCOUNTER — Ambulatory Visit (HOSPITAL_COMMUNITY): Payer: Self-pay | Admitting: Psychiatry

## 2013-07-30 ENCOUNTER — Ambulatory Visit (HOSPITAL_COMMUNITY): Payer: Self-pay | Admitting: Psychiatry

## 2013-07-30 ENCOUNTER — Ambulatory Visit (INDEPENDENT_AMBULATORY_CARE_PROVIDER_SITE_OTHER): Payer: Medicare Other | Admitting: Psychiatry

## 2013-07-30 ENCOUNTER — Encounter (HOSPITAL_COMMUNITY): Payer: Self-pay | Admitting: Psychiatry

## 2013-07-30 VITALS — Wt 139.6 lb

## 2013-07-30 DIAGNOSIS — M797 Fibromyalgia: Secondary | ICD-10-CM

## 2013-07-30 DIAGNOSIS — F319 Bipolar disorder, unspecified: Secondary | ICD-10-CM

## 2013-07-30 DIAGNOSIS — F411 Generalized anxiety disorder: Secondary | ICD-10-CM

## 2013-07-30 MED ORDER — PAROXETINE HCL 40 MG PO TABS: 40.0000 mg | ORAL_TABLET | ORAL | Status: DC

## 2013-07-30 MED ORDER — TRAZODONE HCL 150 MG PO TABS: 150.0000 mg | ORAL_TABLET | Freq: Every day | ORAL | Status: DC

## 2013-07-30 MED ORDER — GABAPENTIN 300 MG PO CAPS: ORAL_CAPSULE | ORAL | Status: DC

## 2013-07-30 NOTE — Progress Notes (Signed)
[facility] [address] Progress Note Katherine Middleton MRN: [id number] DOB: Feb 26, 1951 Age: 62 y.o.  Date: 07/30/2013  Chief Complaint  Patient presents with  . Follow-up  . Medication Refill    History of present illness. Patient is 62 year old Caucasian female who came for her for the appointment.  She's been seeing Dr. Dan Middleton in this office .  She is known to this Clinical research associate from the past.  In the past few months her medication has been changed.  She was given a muscle relaxant and Lidoderm patches from Dr. Dan Middleton.  However she stopped getting dose due to expense.  She is taking Flexeril from her primary care physician Dr. Karmen Middleton.  Recently Dr. Dan Middleton increased her gabapentin to 300 mg up to 8 or 9 times a day.  The patient has tried a 8 times a times a day but felt very groggy.  She is taking up to 6 or 7 times a day as needed.  She is sleeping better.  She is seeing a therapist regularly.  She denies any recent agitation and that her mood swing.  Both her son got engaged.  Patient is happy for them.  Patient denies any tremors or shakes.  She denies any recent panic attack.  She continues to have chronic pain but overall she is doing better.  She's not drinking or using any illegal substance.  Current Outpatient Prescriptions  Medication Sig Dispense Refill  . ALPRAZolam (XANAX) 0.5 MG tablet Take 0.5 mg by mouth 3 (three) times daily as needed.        . cetirizine (ZYRTEC) 5 MG tablet Take 5 mg by mouth daily.        . cyclobenzaprine (FLEXERIL) 10 MG tablet Take 10 mg by mouth 3 (three) times daily as needed.      . gabapentin (NEURONTIN) 300 MG capsule Take by mouth 300 up to 7 times a day for pain anxiety and insomnia  210 capsule  1  . PARoxetine (PAXIL) 40 MG tablet Take 1 tablet (40 mg total) by mouth every morning.  90 tablet  0  . ranitidine (ZANTAC) 150 MG tablet Take 150 mg by mouth 2 (two) times daily.        . simvastatin (ZOCOR) 20 MG tablet Take 20 mg by mouth.       . traZODone (DESYREL) 150 MG tablet Take 1 tablet (150 mg total) by mouth at bedtime.  90 tablet  0  . Vitamin D, Ergocalciferol, (DRISDOL) 50000 UNITS CAPS Take 50,000 Units by mouth every Wednesday.       No current facility-administered medications for this visit.    Past psychiatric history Patient has a long history of depression and bipolar disorder started in 2003.  She has been seeing in this office since then.  She denies any history of suicidal attempt or any inpatient psychiatric treatment however endorse history of severe depression with passive suicidal thinking and anger issues.  She is seeing therapist in this office regularly.  Allergies: Allergies  Allergen Reactions  . Codeine Itching  . Cymbalta (Duloxetine Hcl) Other (See Comments)    GERD  . Oxycodone Itching and Other (See Comments)    "OUT THERE", climbing the walls  . Baclofen Other (See Comments)    Anxiety got much worse and possibly ppted manic episode  . Betadine (Povidone Iodine) Itching    Particularly if in a douche   . Penicillins Swelling  . Sulfa Antibiotics Swelling  . Aspirin Hives and Rash  .  Elavil (Amitriptyline) Other (See Comments)    Caused bowels to empty quickly and couldn't sleep on it.  . Latex Other (See Comments)    If in mouth her mouth swells  . Nsaids Nausea Only and Other (See Comments)    Almost passing out, bad acid reflux   Medical History: Past Medical History  Diagnosis Date  . Fibromyalgia   . Arthritis   . Osteoarthritis   . Bipolar disorder   . GERD (gastroesophageal reflux disease) 12/31/2002  . Sinusitis 12/31/2012   Surgical History: Past Surgical History  Procedure Laterality Date  . Tonsillectomy  04/08/57    some date around then  . Tubal ligation  10/21/1984   Family History family history includes ADD / ADHD in her others; Alcohol abuse in her maternal uncle and paternal uncle; Anxiety disorder in her mother and sister; Bipolar disorder in her father and  sister; Dementia (age of onset: 14) in her sister; Healthy in her other; Paranoid behavior in her sister; and Schizophrenia in her father.  There is no history of OCD, and Seizures, and Sexual abuse, and Physical abuse, .  Vitals: Wt 139 lb 9.6 oz (63.322 kg)  BMI 23.77 kg/m2 Wt loss 8 pounds from last visit.  Mental status examination Patient is casually dressed and fairly groomed. She is calm cooperative and maintained good eye contact. Her speech is soft clear and coherent. Her thought process is logical linear and goal-directed. She denies active or passive suicidal thinking and homicidal thinking. She denies any auditory or visual hallucination. Her attention and concentration is fair. She described her mood is tired and her affect is mood appropriate. There no psychotic symptoms present at this time. She's alert and oriented x3. Her insight judgment and impulse control is okay.  Lab Results:  Results for orders placed in visit on 12/17/12 (from the past 8736 hour(s))  VITAMIN D 25 HYDROXY   Collection Time    12/17/12  2:34 PM      Result Value Range   Vit D, 25-Hydroxy 38  30 - 89 ng/mL   Assessment Axis I bipolar disorder NOS, anxiety, Vitamin D deficiency by history Axis II deferred Axis III see medical history Axis IV mild to moderate  Plan: I reviewed her medication and psychosocial history.  I recommend to take a parenting up to 7 times a day if needed .  Patient had tried more than 7 times a day and felt very groggy.  Patient is still taking moderate dose of gabapentin but does not like to cut down further.  She is taking muscle relaxants and Xanax from her primary care physician.  Continue Paxil and trazodone the present dose.  Recommend to call us back if she is any question or concern if she feels worse to the symptom.  Recommend to see therapist in this office regularly.  Followup in 3 months.    MEDICATIONS this encounter: Meds ordered this encounter  Medications  .  cyclobenzaprine (FLEXERIL) 10 MG tablet    Sig: Take 10 mg by mouth 3 (three) times daily as needed.  . simvastatin (ZOCOR) 20 MG tablet    Sig: Take 20 mg by mouth.  Marland Kitchen PARoxetine (PAXIL) 40 MG tablet    Sig: Take 1 tablet (40 mg total) by mouth every morning.    Dispense:  90 tablet    Refill:  0  . gabapentin (NEURONTIN) 300 MG capsule    Sig: Take by mouth 300 up to 7 times a day  for pain anxiety and insomnia    Dispense:  210 capsule    Refill:  1  . traZODone (DESYREL) 150 MG tablet    Sig: Take 1 tablet (150 mg total) by mouth at bedtime.    Dispense:  90 tablet    Refill:  0    Medical Decision Making Problem Points:  Established problem, stable/improving (1), Review of last therapy session (1) and Review of psycho-social stressors (1) Data Points:  Review or order clinical lab tests (1) Review of medication regiment & side effects (2)  Livie Vanderhoof T., MD

## 2013-08-04 ENCOUNTER — Ambulatory Visit (INDEPENDENT_AMBULATORY_CARE_PROVIDER_SITE_OTHER): Payer: Medicare Other | Admitting: Psychology

## 2013-08-04 ENCOUNTER — Ambulatory Visit (HOSPITAL_COMMUNITY): Payer: Self-pay | Admitting: Psychiatry

## 2013-08-04 DIAGNOSIS — F3111 Bipolar disorder, current episode manic without psychotic features, mild: Secondary | ICD-10-CM

## 2013-08-04 DIAGNOSIS — F411 Generalized anxiety disorder: Secondary | ICD-10-CM

## 2013-08-05 ENCOUNTER — Encounter (HOSPITAL_COMMUNITY): Payer: Self-pay | Admitting: Psychology

## 2013-08-05 NOTE — Progress Notes (Signed)
Patient:  Katherine Middleton   DOB: 10-30-1951  MR Number: [id number]  Location: BEHAVIORAL [facility] [address] [place] [address] Dept: [phone]  Start: 1 PM End: 2 PM  Provider/Observer:     Hershal Coria PSYD  Chief Complaint:      Chief Complaint  Patient presents with  . Anxiety  . Depression  . Stress    Reason For Service:     The patient was referred for psychotherapeutic interventions in conjunction with ongoing psychiatric care. The patient has been diagnosed with bipolar disorder but has also had a number of medical issues including gastrointestinal problems, fibromyalgia, and a spastic colon.  Interventions Strategy:  Cognitive/behavioral psychotherapy  Participation Level:   Active  Participation Quality:  Appropriate      Behavioral Observation:  Well Groomed, Alert, and Appropriate.   Current Psychosocial Factors: The patient reports that she had a very good visit with her son who has been living in [place] and his new girlfriend. In fact, she was told during her visit by her son that he was planning on proposing to his girlfriend the patient is very excited about that. Just within the past couple of days she received an e-mail from her son's and that he had proposed and that he use the patient's wedding ring for this proposal and everything went gray. The patient is very happy about this and excited.  Content of Session:   Review current symptoms and continued to work on cognitive/behavioral therapeutic interventions.  Current Status:   The patient reports that she did have some mild depressive swing right after her son left after his visit from [place]. However, her mood is been improving.  Patient Progress:   Very good  Target Goals:   Target goals include mood stability, better coping skills, keeping issues and symptoms of depression from becoming too problematic and making  situations when she becomes hypomanic or manic he from creating difficulties.  Last Reviewed:   08/04/2013  Goals Addressed Today:    We continued to work on issues related to mood stability and better coping skills around recurrent depression and manic/hypomanic events..  Impression/Diagnosis:   The patient has a long-standing history of difficulties with anxiety and significant mood disturbance. She has been diagnosed with a formal diagnosis of bipolar disorder.  Diagnosis:    Axis I:  Bipolar I disorder, most recent episode (or current) manic, mild  Anxiety state, unspecified      Axis II: No diagnosis

## 2013-09-04 ENCOUNTER — Ambulatory Visit (INDEPENDENT_AMBULATORY_CARE_PROVIDER_SITE_OTHER): Payer: Medicare Other | Admitting: Psychology

## 2013-09-04 DIAGNOSIS — F3111 Bipolar disorder, current episode manic without psychotic features, mild: Secondary | ICD-10-CM

## 2013-09-04 DIAGNOSIS — F411 Generalized anxiety disorder: Secondary | ICD-10-CM

## 2013-09-07 ENCOUNTER — Other Ambulatory Visit: Payer: Self-pay | Admitting: Family Medicine

## 2013-09-07 DIAGNOSIS — Z1231 Encounter for screening mammogram for malignant neoplasm of breast: Secondary | ICD-10-CM

## 2013-09-23 ENCOUNTER — Ambulatory Visit
Admission: RE | Admit: 2013-09-23 | Discharge: 2013-09-23 | Disposition: A | Discharge status: Home / Self Care | Payer: Medicare Other | Source: Ambulatory Visit | Attending: Family Medicine | Admitting: Family Medicine

## 2013-09-23 DIAGNOSIS — Z1231 Encounter for screening mammogram for malignant neoplasm of breast: Secondary | ICD-10-CM

## 2013-10-02 ENCOUNTER — Ambulatory Visit (INDEPENDENT_AMBULATORY_CARE_PROVIDER_SITE_OTHER): Payer: Medicare Other | Admitting: Psychology

## 2013-10-02 DIAGNOSIS — IMO0001 Reserved for inherently not codable concepts without codable children: Secondary | ICD-10-CM

## 2013-10-02 DIAGNOSIS — F3111 Bipolar disorder, current episode manic without psychotic features, mild: Secondary | ICD-10-CM

## 2013-10-02 DIAGNOSIS — F411 Generalized anxiety disorder: Secondary | ICD-10-CM

## 2013-10-02 DIAGNOSIS — M797 Fibromyalgia: Secondary | ICD-10-CM

## 2013-10-23 ENCOUNTER — Encounter (HOSPITAL_COMMUNITY): Payer: Self-pay | Admitting: Psychology
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
## 2013-10-30 ENCOUNTER — Encounter (HOSPITAL_COMMUNITY): Payer: Self-pay | Admitting: Psychiatry

## 2013-10-30 ENCOUNTER — Ambulatory Visit (INDEPENDENT_AMBULATORY_CARE_PROVIDER_SITE_OTHER): Payer: Medicare Other | Admitting: Psychiatry

## 2013-10-30 VITALS — BP 140/80 | Ht 64.0 in | Wt 150.0 lb

## 2013-10-30 DIAGNOSIS — M797 Fibromyalgia: Secondary | ICD-10-CM

## 2013-10-30 DIAGNOSIS — Z8639 Personal history of other endocrine, nutritional and metabolic disease: Secondary | ICD-10-CM

## 2013-10-30 DIAGNOSIS — F3111 Bipolar disorder, current episode manic without psychotic features, mild: Secondary | ICD-10-CM

## 2013-10-30 DIAGNOSIS — F319 Bipolar disorder, unspecified: Secondary | ICD-10-CM

## 2013-10-30 MED ORDER — PAROXETINE HCL 40 MG PO TABS: 40.0000 mg | ORAL_TABLET | ORAL | Status: DC

## 2013-10-30 MED ORDER — ALPRAZOLAM 0.5 MG PO TABS: 0.5000 mg | ORAL_TABLET | Freq: Four times a day (QID) | ORAL | Status: DC | PRN

## 2013-10-30 MED ORDER — GABAPENTIN 300 MG PO CAPS: ORAL_CAPSULE | ORAL | Status: DC

## 2013-10-30 MED ORDER — TRAZODONE HCL 150 MG PO TABS: 150.0000 mg | ORAL_TABLET | Freq: Every day | ORAL | Status: DC

## 2013-10-30 NOTE — Progress Notes (Signed)
Patient ID: Katherine Middleton, female   DOB: 04/11/1951, 61 y.o.   MRN: [id number] [facility] [address] Progress Note Katherine Middleton MRN: [id number] DOB: 06/12/1951 Age: 62 y.o.  Date: 10/30/2013  Chief Complaint  Patient presents with  . Anxiety  . Depression  . Follow-up    History of present illness. Patient is 83 year old Caucasian female who lives with her boyfriend in [place]. Her husband died in 05-14-10. She is on disability for fibromyalgia arthritis and bipolar disorder.  The patient has been seeing a psychiatrist for years. Initially she was diagnosed with depression but eventually she developed some manic symptoms and now is designated as having bipolar disorder. She's often very tired due to her fibromyalgia and has chronic muscle aches and pains. She feels that the medicines she is on now been very helpful. She sleeping well and her mood is stable. She's not using drugs or alcohol denies suicidal ideation. She's never been psychotic but mentions having up "breakdown" about 10 years ago. She's never been hospitalized. She states that her boyfriend is very good to her and "keeps me laughing" which is helped improve her mood dramatically  Current Outpatient Prescriptions  Medication Sig Dispense Refill  . ALPRAZolam (XANAX) 0.5 MG tablet Take 1 tablet (0.5 mg total) by mouth 4 (four) times daily as needed.  120 tablet  2  . cetirizine (ZYRTEC) 5 MG tablet Take 5 mg by mouth daily.        . cyclobenzaprine (FLEXERIL) 10 MG tablet Take 10 mg by mouth 3 (three) times daily as needed.      . gabapentin (NEURONTIN) 300 MG capsule Take by mouth 300 up to 7 times a day for pain anxiety and insomnia  210 capsule  2  . omeprazole (PRILOSEC) 20 MG capsule       . PARoxetine (PAXIL) 40 MG tablet Take 1 tablet (40 mg total) by mouth every morning.  90 tablet  0  . simvastatin (ZOCOR) 20 MG tablet Take 20 mg by mouth.      . traZODone (DESYREL) 150 MG tablet Take 1 tablet (150 mg total)  by mouth at bedtime.  90 tablet  0  . Vitamin D, Ergocalciferol, (DRISDOL) 50000 UNITS CAPS Take 50,000 Units by mouth every Wednesday.      . ranitidine (ZANTAC) 150 MG tablet Take 150 mg by mouth 2 (two) times daily.         No current facility-administered medications for this visit.    Past psychiatric history Patient has a long history of depression and bipolar disorder started in 05/14/02.  She has been seeing in this office since then.  She denies any history of suicidal attempt or any inpatient psychiatric treatment however endorse history of severe depression with passive suicidal thinking and anger issues.  She is seeing therapist in this office regularly.  Allergies: Allergies  Allergen Reactions  . Codeine Itching  . Cymbalta [Duloxetine Hcl] Other (See Comments)    GERD  . Oxycodone Itching and Other (See Comments)    "OUT THERE", climbing the walls  . Baclofen Other (See Comments)    Anxiety got much worse and possibly ppted manic episode  . Betadine [Povidone Iodine] Itching    Particularly if in a douche   . Penicillins Swelling  . Sulfa Antibiotics Swelling  . Aspirin Hives and Rash  . Elavil [Amitriptyline] Other (See Comments)    Caused bowels to empty quickly and couldn't sleep on it.  . Latex Other (See  Comments)    If in mouth her mouth swells  . Nsaids Nausea Only and Other (See Comments)    Almost passing out, bad acid reflux   Medical History: Past Medical History  Diagnosis Date  . Fibromyalgia   . Arthritis   . Osteoarthritis   . Bipolar disorder   . GERD (gastroesophageal reflux disease) 12/31/2002  . Sinusitis 12/31/2012   Surgical History: Past Surgical History  Procedure Laterality Date  . Tonsillectomy  04/08/57    some date around then  . Tubal ligation  10/21/1984   Family History family history includes ADD / ADHD in her other and other; Alcohol abuse in her maternal uncle and paternal uncle; Anxiety disorder in her mother and sister; Bipolar  disorder in her father and sister; Dementia (age of onset: 24) in her sister; Healthy in her other; Paranoid behavior in her sister; Schizophrenia in her father. There is no history of OCD, Seizures, Sexual abuse, or Physical abuse.  Vitals: BP 140/80  Ht 5\' 4"  (1.626 m)  Wt 150 lb (68.04 kg)  BMI 25.73 kg/m2 Wt loss 8 pounds from last visit.  Mental status examination Patient is casually dressed and fairly groomed. She is calm cooperative and maintained good eye contact. Her speech is soft clear and coherent. Her thought process is logical linear and goal-directed. She denies active or passive suicidal thinking and homicidal thinking. She denies any auditory or visual hallucination. Her attention and concentration is fair. She described her mood is tired and her affect is mood appropriate. There no psychotic symptoms present at this time. She's alert and oriented x3. Her insight judgment and impulse control is okay.  Lab Results:  Results for orders placed in visit on 12/17/12 (from the past 8736 hour(s))  VITAMIN D 25 HYDROXY   Collection Time    12/17/12  2:34 PM      Result Value Range   Vit D, 25-Hydroxy 38  30 - 89 ng/mL   Assessment Axis I bipolar disorder NOS, anxiety, Vitamin D deficiency by history Axis II deferred Axis III see medical history Axis IV mild to moderate  Plan: I reviewed her medication and psychosocial history.  She'll continue all current medications.  Recommend to call us back if she is any question or concern if she feels worse to the symptom.  Recommend to see therapist in this office regularly.  Followup in 3 months.    MEDICATIONS this encounter: Meds ordered this encounter  Medications  . omeprazole (PRILOSEC) 20 MG capsule    Sig:   . ALPRAZolam (XANAX) 0.5 MG tablet    Sig: Take 1 tablet (0.5 mg total) by mouth 4 (four) times daily as needed.    Dispense:  120 tablet    Refill:  2  . gabapentin (NEURONTIN) 300 MG capsule    Sig: Take by mouth  300 up to 7 times a day for pain anxiety and insomnia    Dispense:  210 capsule    Refill:  2  . PARoxetine (PAXIL) 40 MG tablet    Sig: Take 1 tablet (40 mg total) by mouth every morning.    Dispense:  90 tablet    Refill:  0  . traZODone (DESYREL) 150 MG tablet    Sig: Take 1 tablet (150 mg total) by mouth at bedtime.    Dispense:  90 tablet    Refill:  0    Medical Decision Making Problem Points:  Established problem, stable/improving (1), Review of last therapy  session (1) and Review of psycho-social stressors (1) Data Points:  Review or order clinical lab tests (1) Review of medication regiment & side effects (2)  ROSS, DEBORAH, MD

## 2013-11-03 ENCOUNTER — Ambulatory Visit (INDEPENDENT_AMBULATORY_CARE_PROVIDER_SITE_OTHER): Payer: Medicare Other | Admitting: Psychology

## 2013-11-03 ENCOUNTER — Encounter (HOSPITAL_COMMUNITY): Payer: Self-pay | Admitting: Psychology

## 2013-11-03 DIAGNOSIS — M797 Fibromyalgia: Secondary | ICD-10-CM

## 2013-11-03 DIAGNOSIS — IMO0001 Reserved for inherently not codable concepts without codable children: Secondary | ICD-10-CM

## 2013-11-03 DIAGNOSIS — F3131 Bipolar disorder, current episode depressed, mild: Secondary | ICD-10-CM

## 2013-11-03 NOTE — Progress Notes (Signed)
Patient:  Katherine Middleton   DOB: [birth year]  MR Number: [id number]  Location: [facility]HAVIORAL [facility] [address] [place] [address] Dept: [phone]  Start: 1 PM End: 2 PM  Provider/Observer:     Hershal Coria PSYD  Chief Complaint:      Chief Complaint  Patient presents with  . Depression  . Agitation  . Stress    Reason For Service:     The patient was referred for psychotherapeutic interventions in conjunction with ongoing psychiatric care. The patient has been diagnosed with bipolar disorder but has also had a number of medical issues including gastrointestinal problems, fibromyalgia, and a spastic colon.  Interventions Strategy:  Cognitive/behavioral psychotherapy  Participation Level:   Active  Participation Quality:  Appropriate      Behavioral Observation:  Well Groomed, Alert, and Appropriate.   Current Psychosocial Factors: The patient reports that while kids are doing well, she has started with more cognitive difficulties and depressive mood.    Content of Session:   Review current symptoms and continued to work on cognitive/behavioral therapeutic interventions.  Current Status:   The patient reports overall she's been doing fairly well but has had recent hypomanic features but they're not active at this point.  More physical pain due to fybro and some old injuries.  Patient Progress:   Very good  Target Goals:   Target goals include mood stability, better coping skills, keeping issues and symptoms of depression from becoming too problematic and making situations when she becomes hypomanic or manic he from creating difficulties.  Last Reviewed:   11/03/2013  Goals Addressed Today:    We continued to work on issues related to mood stability and better coping skills around recurrent depression and manic/hypomanic events..  Impression/Diagnosis:   The patient has a long-standing  history of difficulties with anxiety and significant mood disturbance. She has been diagnosed with a formal diagnosis of bipolar disorder.  Diagnosis:    Axis I:  Bipolar I disorder, most recent episode (or current) depressed, mild  Fibromyalgia      Axis II: No diagnosis

## 2013-11-05 ENCOUNTER — Other Ambulatory Visit: Payer: Self-pay

## 2013-11-20 ENCOUNTER — Encounter (HOSPITAL_COMMUNITY): Payer: Self-pay | Admitting: Psychology
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
## 2013-12-03 ENCOUNTER — Ambulatory Visit (INDEPENDENT_AMBULATORY_CARE_PROVIDER_SITE_OTHER): Payer: Medicare Other | Admitting: Psychology

## 2013-12-03 ENCOUNTER — Encounter (HOSPITAL_COMMUNITY): Payer: Self-pay | Admitting: Psychology

## 2013-12-03 DIAGNOSIS — IMO0001 Reserved for inherently not codable concepts without codable children: Secondary | ICD-10-CM

## 2013-12-03 DIAGNOSIS — M797 Fibromyalgia: Secondary | ICD-10-CM

## 2013-12-03 DIAGNOSIS — F3131 Bipolar disorder, current episode depressed, mild: Secondary | ICD-10-CM

## 2013-12-03 NOTE — Progress Notes (Signed)
Patient:  Katherine Middleton   DOB: 09-06-1951  MR Number: [id number]  Location: BEHAVIORAL [facility] [address] [place] [address] Dept: [phone]  Start: 1 PM End: 2 PM  Provider/Observer:     Hershal Coria PSYD  Chief Complaint:      Chief Complaint  Patient presents with  . Agitation  . Anxiety    Reason For Service:     The patient was referred for psychotherapeutic interventions in conjunction with ongoing psychiatric care. The patient has been diagnosed with bipolar disorder but has also had a number of medical issues including gastrointestinal problems, fibromyalgia, and a spastic colon.  Interventions Strategy:  Cognitive/behavioral psychotherapy  Participation Level:   Active  Participation Quality:  Appropriate      Behavioral Observation:  Well Groomed, Alert, and Appropriate.   Current Psychosocial Factors: The patient reports that she has been doing well and her oldest son is getting married in March.  She has done well with boyfriend and continuing to work on Pharmacologist..    Content of Session:   Review current symptoms and continued to work on cognitive/behavioral therapeutic interventions.  Current Status:   The patient reports overall she's been doing fairly well but has had recent hypomanic features but they're not active at this point.  More physical pain due to fybro and some old injuries.  Patient Progress:   Very good  Target Goals:   Target goals include mood stability, better coping skills, keeping issues and symptoms of depression from becoming too problematic and making situations when she becomes hypomanic or manic he from creating difficulties.  Last Reviewed:   12/03/2013  Goals Addressed Today:    We continued to work on issues related to mood stability and better coping skills around recurrent depression and manic/hypomanic  events..  Impression/Diagnosis:   The patient has a long-standing history of difficulties with anxiety and significant mood disturbance. She has been diagnosed with a formal diagnosis of bipolar disorder.  Diagnosis:    Axis I:  Bipolar I disorder, most recent episode (or current) depressed, mild  Fibromyalgia      Axis II: No diagnosis

## 2014-01-05 ENCOUNTER — Ambulatory Visit (INDEPENDENT_AMBULATORY_CARE_PROVIDER_SITE_OTHER): Payer: Medicare Other | Admitting: Psychology

## 2014-01-05 ENCOUNTER — Encounter (HOSPITAL_COMMUNITY): Payer: Self-pay | Admitting: Psychology

## 2014-01-05 DIAGNOSIS — IMO0001 Reserved for inherently not codable concepts without codable children: Secondary | ICD-10-CM

## 2014-01-05 DIAGNOSIS — F3131 Bipolar disorder, current episode depressed, mild: Secondary | ICD-10-CM

## 2014-01-05 DIAGNOSIS — M797 Fibromyalgia: Secondary | ICD-10-CM

## 2014-01-05 NOTE — Progress Notes (Signed)
Patient:  Katherine Middleton   DOB: 1951/03/30  MR Number: [id number]  Location: [facility] [address] [place] [address] Dept: [phone]  Start: 1 PM End: 2 PM  Provider/Observer:     Edgardo Roys PSYD  Chief Complaint:      Chief Complaint  Patient presents with  . Depression    Reason For Service:     The patient was referred for psychotherapeutic interventions in conjunction with ongoing psychiatric care. The patient has been diagnosed with bipolar disorder but has also had a number of medical issues including gastrointestinal problems, fibromyalgia, and a spastic colon.  Interventions Strategy:  Cognitive/behavioral psychotherapy  Participation Level:   Active  Participation Quality:  Appropriate      Behavioral Observation:  Well Groomed, Alert, and Appropriate.   Current Psychosocial Factors: The patient reports that the holidays went well with a lot of activities and was able to pace self to not get too warn down.  Mood has been stable for most part.    Content of Session:   Review current symptoms and continued to work on cognitive/behavioral therapeutic interventions.  Current Status:   The patient reports overall she's been doing fairly well but has had recent hypomanic features but they're not active at this point.  More physical pain due to fybro and some old injuries.  Patient Progress:   Very good  Target Goals:   Target goals include mood stability, better coping skills, keeping issues and symptoms of depression from becoming too problematic and making situations when she becomes hypomanic or manic he from creating difficulties.  Last Reviewed:   01/05/2014  Goals Addressed Today:    We continued to work on issues related to mood stability and better coping skills around recurrent depression and manic/hypomanic events..  Impression/Diagnosis:   The patient has a  long-standing history of difficulties with anxiety and significant mood disturbance. She has been diagnosed with a formal diagnosis of bipolar disorder.  Diagnosis:    Axis I:  Bipolar I disorder, most recent episode (or current) depressed, mild  Fibromyalgia      Axis II: No diagnosis

## 2014-01-29 ENCOUNTER — Encounter (HOSPITAL_COMMUNITY): Payer: Self-pay | Admitting: Psychiatry

## 2014-01-29 ENCOUNTER — Ambulatory Visit (INDEPENDENT_AMBULATORY_CARE_PROVIDER_SITE_OTHER): Payer: Medicare Other | Admitting: Psychiatry

## 2014-01-29 VITALS — BP 150/84 | Ht 64.0 in | Wt 154.0 lb

## 2014-01-29 DIAGNOSIS — M797 Fibromyalgia: Secondary | ICD-10-CM

## 2014-01-29 DIAGNOSIS — F411 Generalized anxiety disorder: Secondary | ICD-10-CM

## 2014-01-29 DIAGNOSIS — F319 Bipolar disorder, unspecified: Secondary | ICD-10-CM

## 2014-01-29 DIAGNOSIS — F3131 Bipolar disorder, current episode depressed, mild: Secondary | ICD-10-CM

## 2014-01-29 MED ORDER — GABAPENTIN 300 MG PO CAPS: ORAL_CAPSULE | ORAL | Status: DC

## 2014-01-29 MED ORDER — PAROXETINE HCL 40 MG PO TABS: 40.0000 mg | ORAL_TABLET | ORAL | Status: DC

## 2014-01-29 MED ORDER — ALPRAZOLAM 0.5 MG PO TABS: 0.5000 mg | ORAL_TABLET | Freq: Four times a day (QID) | ORAL | Status: DC | PRN

## 2014-01-29 MED ORDER — TRAZODONE HCL 150 MG PO TABS: 150.0000 mg | ORAL_TABLET | Freq: Every day | ORAL | Status: DC

## 2014-01-29 NOTE — Progress Notes (Signed)
Patient ID: Katherine Middleton, female   DOB: 07-Mar-1951, 63 y.o.   MRN: [id number] Patient ID: Katherine Middleton, female   DOB: [birth year], 63 y.o.   MRN: [id number] [place] [id number] Progress Note Katherine Middleton MRN: [id number] DOB: 1951-11-05 Age: 63 y.o.  Date: 01/29/2014  Chief Complaint  Patient presents with  . Anxiety  . Depression  . Follow-up    History of present illness. Patient is 63 year old Caucasian female who lives with her boyfriend in [place]. Her husband died in 2010-04-20. She is on disability for fibromyalgia arthritis and bipolar disorder.  The patient has been seeing a psychiatrist for years. Initially she was diagnosed with depression but eventually she developed some manic symptoms and now is designated as having bipolar disorder. She's often very tired due to her fibromyalgia and has chronic muscle aches and pains. She feels that the medicines she is on now been very helpful. She sleeping well and her mood is stable. She's not using drugs or alcohol denies suicidal ideation. She's never been psychotic but mentions having up "breakdown" about 10 years ago. She's never been hospitalized. She states that her boyfriend is very good to her and "keeps me laughing" which is helped improve her mood dramatically  The patient returns after 3 months. She continues to do quite well. She states that the cold weather has affected her fibromyalgia and she is in more pain. However her boyfriend is very supportive. She had some recent conflicts with her son who was critical of her boyfriend. She is now keeping her distance from her son because he is so negative. She denies significant depressive symptoms or anxiety and she is sleeping well  Current Outpatient Prescriptions  Medication Sig Dispense Refill  . ALPRAZolam (XANAX) 0.5 MG tablet Take 1 tablet (0.5 mg total) by mouth 4 (four) times daily as needed.  120 tablet  2  . cetirizine (ZYRTEC) 5 MG tablet Take 5 mg by mouth daily.         . cyclobenzaprine (FLEXERIL) 10 MG tablet Take 10 mg by mouth 3 (three) times daily as needed.      . gabapentin (NEURONTIN) 300 MG capsule Take by mouth 300 up to 7 times a day for pain anxiety and insomnia  210 capsule  2  . omeprazole (PRILOSEC) 20 MG capsule       . PARoxetine (PAXIL) 40 MG tablet Take 1 tablet (40 mg total) by mouth every morning.  90 tablet  0  . ranitidine (ZANTAC) 150 MG tablet Take 150 mg by mouth 2 (two) times daily.        . simvastatin (ZOCOR) 20 MG tablet Take 20 mg by mouth.      . traZODone (DESYREL) 150 MG tablet Take 1 tablet (150 mg total) by mouth at bedtime.  90 tablet  0  . Vitamin D, Ergocalciferol, (DRISDOL) 50000 UNITS CAPS Take 50,000 Units by mouth every Wednesday.       No current facility-administered medications for this visit.    Past psychiatric history Patient has a long history of depression and bipolar disorder started in 04/20/02.  She has been seeing in this office since then.  She denies any history of suicidal attempt or any inpatient psychiatric treatment however endorse history of severe depression with passive suicidal thinking and anger issues.  She is seeing therapist in this office regularly.  Allergies: Allergies  Allergen Reactions  . Codeine Itching  . Cymbalta [Duloxetine Hcl] Other (See Comments)  GERD  . Oxycodone Itching and Other (See Comments)    "OUT THERE", climbing the walls  . Baclofen Other (See Comments)    Anxiety got much worse and possibly ppted manic episode  . Betadine [Povidone Iodine] Itching    Particularly if in a douche   . Penicillins Swelling  . Sulfa Antibiotics Swelling  . Aspirin Hives and Rash  . Elavil [Amitriptyline] Other (See Comments)    Caused bowels to empty quickly and couldn't sleep on it.  . Latex Other (See Comments)    If in mouth her mouth swells  . Nsaids Nausea Only and Other (See Comments)    Almost passing out, bad acid reflux   Medical History: Past Medical History   Diagnosis Date  . Fibromyalgia   . Arthritis   . Osteoarthritis   . Bipolar disorder   . GERD (gastroesophageal reflux disease) 12/31/2002  . Sinusitis 12/31/2012   Surgical History: Past Surgical History  Procedure Laterality Date  . Tonsillectomy  04/08/57    some date around then  . Tubal ligation  10/21/1984   Family History family history includes ADD / ADHD in her other and other; Alcohol abuse in her maternal uncle and paternal uncle; Anxiety disorder in her mother and sister; Bipolar disorder in her father and sister; Dementia (age of onset: 38) in her sister; Healthy in her other; Paranoid behavior in her sister; Schizophrenia in her father. There is no history of OCD, Seizures, Sexual abuse, or Physical abuse.  Vitals: BP 150/84  Ht 5\' 4"  (1.626 m)  Wt 154 lb (69.854 kg)  BMI 26.42 kg/m2 Wt loss 8 pounds from last visit.  Mental status examination Patient is casually dressed and fairly groomed. She is calm cooperative and maintained good eye contact. Her speech is soft clear and coherent. Her thought process is logical linear and goal-directed. She denies active or passive suicidal thinking and homicidal thinking. She denies any auditory or visual hallucination. Her attention and concentration is fair. She described her mood is tired and her affect is mood appropriate. There no psychotic symptoms present at this time. She's alert and oriented x3. Her insight judgment and impulse control is okay.  Lab Results:  No results found for this or any previous visit (from the past 8736 hour(s)). Assessment Axis I bipolar disorder NOS, anxiety, Vitamin D deficiency by history Axis II deferred Axis III see medical history Axis IV mild to moderate  Plan: I reviewed her medication and psychosocial history.  She'll continue all current medications.  Recommend to call us back if she is any question or concern if she feels worse to the symptom.  Recommend to see therapist in this office  regularly.  Followup in 3 months.    MEDICATIONS this encounter: Meds ordered this encounter  Medications  . traZODone (DESYREL) 150 MG tablet    Sig: Take 1 tablet (150 mg total) by mouth at bedtime.    Dispense:  90 tablet    Refill:  0  . PARoxetine (PAXIL) 40 MG tablet    Sig: Take 1 tablet (40 mg total) by mouth every morning.    Dispense:  90 tablet    Refill:  0  . gabapentin (NEURONTIN) 300 MG capsule    Sig: Take by mouth 300 up to 7 times a day for pain anxiety and insomnia    Dispense:  210 capsule    Refill:  2  . ALPRAZolam (XANAX) 0.5 MG tablet    Sig: Take 1  tablet (0.5 mg total) by mouth 4 (four) times daily as needed.    Dispense:  120 tablet    Refill:  2    Medical Decision Making Problem Points:  Established problem, stable/improving (1), Review of last therapy session (1) and Review of psycho-social stressors (1) Data Points:  Review or order clinical lab tests (1) Review of medication regiment & side effects (2)  ROSS, DEBORAH, MD

## 2014-02-09 ENCOUNTER — Ambulatory Visit (INDEPENDENT_AMBULATORY_CARE_PROVIDER_SITE_OTHER): Payer: Medicare Other | Admitting: Psychology

## 2014-02-09 DIAGNOSIS — F419 Anxiety disorder, unspecified: Secondary | ICD-10-CM

## 2014-02-09 DIAGNOSIS — M797 Fibromyalgia: Secondary | ICD-10-CM

## 2014-02-09 DIAGNOSIS — F411 Generalized anxiety disorder: Secondary | ICD-10-CM

## 2014-02-09 DIAGNOSIS — IMO0001 Reserved for inherently not codable concepts without codable children: Secondary | ICD-10-CM

## 2014-02-09 DIAGNOSIS — F3131 Bipolar disorder, current episode depressed, mild: Secondary | ICD-10-CM

## 2014-02-17 ENCOUNTER — Encounter (HOSPITAL_COMMUNITY): Payer: Self-pay | Admitting: Psychology

## 2014-02-17 NOTE — Progress Notes (Signed)
Patient:  Katherine Middleton   DOB: 05/10/1951  MR Number: [id number]  Location: [facility] [address] [place] [address] Dept: [phone]  Start: 1 PM End: 2 PM  Provider/Observer:     Edgardo Roys PSYD  Chief Complaint:      Chief Complaint  Patient presents with  . Anxiety  . Depression  . Agitation    Reason For Service:     The patient was referred for psychotherapeutic interventions in conjunction with ongoing psychiatric care. The patient has been diagnosed with bipolar disorder but has also had a number of medical issues including gastrointestinal problems, fibromyalgia, and a spastic colon.  Interventions Strategy:  Cognitive/behavioral psychotherapy  Participation Level:   Active  Participation Quality:  Appropriate      Behavioral Observation:  Well Groomed, Alert, and Appropriate.   Current Psychosocial Factors: The patient reports that there've been major issues within the family recently. She reports that her oldest son has been voicing a great deal of concern about her relationship and the fact that her boyfriend has not maintaining full time gainful employment. They're in issues financially particularly around her son and a taking responsibility for pain the cell phones bill like he promised. She reports that at times were cut off for a short period of time which was a major problem for her as this is her only telephone. She reports that this point she is unsure how to address this with her son.    Content of Session:   Review current symptoms and continued to work on cognitive/behavioral therapeutic interventions.  Current Status:   The patient reports overall she's been doing fairly well but has had recent hypomanic features but they're not active at this point.  More physical pain due to fybro and some old injuries.  Patient Progress:   Very good  Target  Goals:   Target goals include mood stability, better coping skills, keeping issues and symptoms of depression from becoming too problematic and making situations when she becomes hypomanic or manic he from creating difficulties.  Last Reviewed:   02/09/2014  Goals Addressed Today:    We continued to work on issues related to mood stability and better coping skills around recurrent depression and manic/hypomanic events..  Impression/Diagnosis:   The patient has a long-standing history of difficulties with anxiety and significant mood disturbance. She has been diagnosed with a formal diagnosis of bipolar disorder.  Diagnosis:    Axis I:  Bipolar I disorder, most recent episode (or current) depressed, mild  Fibromyalgia  Anxiety      Axis II: No diagnosis

## 2014-03-10 ENCOUNTER — Ambulatory Visit (HOSPITAL_COMMUNITY): Payer: Self-pay | Admitting: Psychology

## 2014-03-30 ENCOUNTER — Ambulatory Visit (INDEPENDENT_AMBULATORY_CARE_PROVIDER_SITE_OTHER): Payer: Medicare Other | Admitting: Psychology

## 2014-03-30 ENCOUNTER — Encounter (HOSPITAL_COMMUNITY): Payer: Self-pay | Admitting: Psychology

## 2014-03-30 DIAGNOSIS — F3131 Bipolar disorder, current episode depressed, mild: Secondary | ICD-10-CM

## 2014-03-30 DIAGNOSIS — M797 Fibromyalgia: Secondary | ICD-10-CM

## 2014-03-30 DIAGNOSIS — IMO0001 Reserved for inherently not codable concepts without codable children: Secondary | ICD-10-CM

## 2014-03-30 NOTE — Progress Notes (Signed)
Patient:  Katherine Middleton   DOB: Oct 19, 1951  MR Number: [id number]  Location: [facility] [address] [place] [address] Dept: [phone]  Start: 1 PM End: 2 PM  Provider/Observer:     Edgardo Roys PSYD  Chief Complaint:      Chief Complaint  Patient presents with  . Anxiety  . Depression    Reason For Service:     The patient was referred for psychotherapeutic interventions in conjunction with ongoing psychiatric care. The patient has been diagnosed with bipolar disorder but has also had a number of medical issues including gastrointestinal problems, fibromyalgia, and a spastic colon.  Interventions Strategy:  Cognitive/behavioral psychotherapy  Participation Level:   Active  Participation Quality:  Appropriate      Behavioral Observation:  Well Groomed, Alert, and Appropriate.   Current Psychosocial Factors: The patient reports that boyfiend has been sick and this has worried her, her boys are doing better and this has helped.  Patient working on quiting smoking.    Content of Session:   Review current symptoms and continued to work on cognitive/behavioral therapeutic interventions.  Current Status:   The patient reports overall she's been doing fairly well but has had recent hypomanic features but they're not active at this point.  More physical pain due to fybro and some old injuries.  Patient Progress:   Very good  Target Goals:   Target goals include mood stability, better coping skills, keeping issues and symptoms of depression from becoming too problematic and making situations when she becomes hypomanic or manic he from creating difficulties.  Last Reviewed:   03/30/2014  Goals Addressed Today:    We continued to work on issues related to mood stability and better coping skills around recurrent depression and manic/hypomanic events..  Impression/Diagnosis:   The patient  has a long-standing history of difficulties with anxiety and significant mood disturbance. She has been diagnosed with a formal diagnosis of bipolar disorder.  Diagnosis:    Axis I:  Bipolar I disorder, most recent episode (or current) depressed, mild  Fibromyalgia      Axis II: No diagnosis

## 2014-04-27 ENCOUNTER — Ambulatory Visit (INDEPENDENT_AMBULATORY_CARE_PROVIDER_SITE_OTHER): Payer: Medicare Other | Admitting: Psychiatry

## 2014-04-27 ENCOUNTER — Encounter (HOSPITAL_COMMUNITY): Payer: Self-pay | Admitting: Psychiatry

## 2014-04-27 VITALS — BP 138/78 | Ht 64.0 in | Wt 162.0 lb

## 2014-04-27 DIAGNOSIS — F319 Bipolar disorder, unspecified: Secondary | ICD-10-CM

## 2014-04-27 DIAGNOSIS — F3131 Bipolar disorder, current episode depressed, mild: Secondary | ICD-10-CM

## 2014-04-27 DIAGNOSIS — M797 Fibromyalgia: Secondary | ICD-10-CM

## 2014-04-27 DIAGNOSIS — F411 Generalized anxiety disorder: Secondary | ICD-10-CM

## 2014-04-27 MED ORDER — TRAZODONE HCL 150 MG PO TABS: 150.0000 mg | ORAL_TABLET | Freq: Every day | ORAL | Status: DC

## 2014-04-27 MED ORDER — GABAPENTIN 300 MG PO CAPS: ORAL_CAPSULE | ORAL | Status: DC

## 2014-04-27 MED ORDER — ALPRAZOLAM 0.5 MG PO TABS: 0.5000 mg | ORAL_TABLET | Freq: Four times a day (QID) | ORAL | Status: DC | PRN

## 2014-04-27 MED ORDER — PAROXETINE HCL 40 MG PO TABS: 40.0000 mg | ORAL_TABLET | ORAL | Status: DC

## 2014-04-27 NOTE — Progress Notes (Signed)
Patient ID: Katherine Middleton, female   DOB: Jul 25, 1951, 63 y.o.   Middleton: [id number] Patient ID: Katherine Middleton, female   DOB: 1951-12-29, 63 y.o.   Middleton: [id number] Patient ID: Katherine Middleton, female   DOB: November 22, 1951, 63 y.o.   Middleton: [id number] [place] [id number] Progress Note Katherine Middleton: [id number] DOB: Sep 15, 1951 Age: 63 y.o.  Date: 04/27/2014  Chief Complaint  Patient presents with  . Anxiety  . Depression  . Follow-up    History of present illness. Patient is 63 year old Caucasian female who lives with her boyfriend in [place]. Her husband died in 15-Apr-2010. She is on disability for fibromyalgia arthritis and bipolar disorder.  The patient has been seeing a psychiatrist for years. Initially she was diagnosed with depression but eventually she developed some manic symptoms and now is designated as having bipolar disorder. She's often very tired due to her fibromyalgia and has chronic muscle aches and pains. She feels that the medicines she is on now been very helpful. She sleeping well and her mood is stable. She's not using drugs or alcohol denies suicidal ideation. She's never been psychotic but mentions having up "breakdown" about 10 years ago. She's never been hospitalized. She states that her boyfriend is very good to her and "keeps me laughing" which is helped improve her mood dramatically  The patient returns after 3 months. Her right hip has been hurting more. She cannot take narcotics due to allergies. She uses tramadol and is also been using more of the Neurontin, up to 2100 mg per day. Her mood has been good however. She states she wakes up "happy". She denies suicidal ideation and is getting along well with her family.  Current Outpatient Prescriptions  Medication Sig Dispense Refill  . ALPRAZolam (XANAX) 0.5 MG tablet Take 1 tablet (0.5 mg total) by mouth 4 (four) times daily as needed.  480 tablet  1  . cetirizine (ZYRTEC) 5 MG tablet Take 5 mg by mouth daily.         . cyclobenzaprine (FLEXERIL) 10 MG tablet Take 10 mg by mouth 3 (three) times daily as needed.      . gabapentin (NEURONTIN) 300 MG capsule Take by mouth 300 up to 7 times a day for pain anxiety and insomnia  840 capsule  2  . omeprazole (PRILOSEC) 20 MG capsule       . PARoxetine (PAXIL) 40 MG tablet Take 1 tablet (40 mg total) by mouth every morning.  90 tablet  3  . ranitidine (ZANTAC) 150 MG tablet Take 150 mg by mouth 2 (two) times daily.        . simvastatin (ZOCOR) 20 MG tablet Take 20 mg by mouth.      . traZODone (DESYREL) 150 MG tablet Take 1 tablet (150 mg total) by mouth at bedtime.  90 tablet  3  . Vitamin D, Ergocalciferol, (DRISDOL) 50000 UNITS CAPS Take 50,000 Units by mouth every Wednesday.       No current facility-administered medications for this visit.    Past psychiatric history Patient has a long history of depression and bipolar disorder started in April 15, 2002.  She has been seeing in this office since then.  She denies any history of suicidal attempt or any inpatient psychiatric treatment however endorse history of severe depression with passive suicidal thinking and anger issues.  She is seeing therapist in this office regularly.  Allergies: Allergies  Allergen Reactions  . Codeine Itching  . Cymbalta [Duloxetine Hcl] Other (  See Comments)    GERD  . Oxycodone Itching and Other (See Comments)    "OUT THERE", climbing the walls  . Baclofen Other (See Comments)    Anxiety got much worse and possibly ppted manic episode  . Betadine [Povidone Iodine] Itching    Particularly if in a douche   . Penicillins Swelling  . Sulfa Antibiotics Swelling  . Aspirin Hives and Rash  . Elavil [Amitriptyline] Other (See Comments)    Caused bowels to empty quickly and couldn't sleep on it.  . Latex Other (See Comments)    If in mouth her mouth swells  . Nsaids Nausea Only and Other (See Comments)    Almost passing out, bad acid reflux   Medical History: Past Medical History   Diagnosis Date  . Fibromyalgia   . Arthritis   . Osteoarthritis   . Bipolar disorder   . GERD (gastroesophageal reflux disease) 12/31/2002  . Sinusitis 12/31/2012   Surgical History: Past Surgical History  Procedure Laterality Date  . Tonsillectomy  04/08/57    some date around then  . Tubal ligation  10/21/1984   Family History family history includes ADD / ADHD in her other and other; Alcohol abuse in her maternal uncle and paternal uncle; Anxiety disorder in her mother and sister; Bipolar disorder in her father and sister; Dementia (age of onset: 65) in her sister; Healthy in her other; Paranoid behavior in her sister; Schizophrenia in her father. There is no history of OCD, Seizures, Sexual abuse, or Physical abuse.  Vitals: BP 138/78  Ht 5\' 4"  (1.626 m)  Wt 162 lb (73.483 kg)  BMI 27.79 kg/m2 Wt loss 8 pounds from last visit.  Mental status examination Patient is casually dressed and fairly groomed. She is calm cooperative and maintained good eye contact. Her speech is soft clear and coherent. Her thought process is logical linear and goal-directed. She denies active or passive suicidal thinking and homicidal thinking. She denies any auditory or visual hallucination. Her attention and concentration is fair. She described her mood as good and her affect is mood appropriate. There no psychotic symptoms present at this time. She's alert and oriented x3. Her insight judgment and impulse control is okay.  Lab Results:  No results found for this or any previous visit (from the past 8736 hour(s)). Assessment Axis I bipolar disorder NOS, anxiety, Vitamin D deficiency by history Axis II deferred Axis III see medical history Axis IV mild to moderate  Plan: I reviewed her medication and psychosocial history.  She'll continue all current medications.  Recommend to call us back if she is any question or concern if she feels worse to the symptom.  Recommend to see therapist in this office  regularly.  Followup in 3 months.    MEDICATIONS this encounter: Meds ordered this encounter  Medications  . DISCONTD: traZODone (DESYREL) 150 MG tablet    Sig: Take 1 tablet (150 mg total) by mouth at bedtime.    Dispense:  90 tablet    Refill:  0  . DISCONTD: gabapentin (NEURONTIN) 300 MG capsule    Sig: Take by mouth 300 up to 7 times a day for pain anxiety and insomnia    Dispense:  210 capsule    Refill:  2  . DISCONTD: PARoxetine (PAXIL) 40 MG tablet    Sig: Take 1 tablet (40 mg total) by mouth every morning.    Dispense:  90 tablet    Refill:  0  . DISCONTD: ALPRAZolam Duanne Moron)  0.5 MG tablet    Sig: Take 1 tablet (0.5 mg total) by mouth 4 (four) times daily as needed.    Dispense:  120 tablet    Refill:  2  . DISCONTD: ALPRAZolam (XANAX) 0.5 MG tablet    Sig: Take 1 tablet (0.5 mg total) by mouth 4 (four) times daily as needed.    Dispense:  120 tablet    Refill:  2  . ALPRAZolam (XANAX) 0.5 MG tablet    Sig: Take 1 tablet (0.5 mg total) by mouth 4 (four) times daily as needed.    Dispense:  480 tablet    Refill:  1  . gabapentin (NEURONTIN) 300 MG capsule    Sig: Take by mouth 300 up to 7 times a day for pain anxiety and insomnia    Dispense:  840 capsule    Refill:  2  . PARoxetine (PAXIL) 40 MG tablet    Sig: Take 1 tablet (40 mg total) by mouth every morning.    Dispense:  90 tablet    Refill:  3  . traZODone (DESYREL) 150 MG tablet    Sig: Take 1 tablet (150 mg total) by mouth at bedtime.    Dispense:  90 tablet    Refill:  3    Medical Decision Making Problem Points:  Established problem, stable/improving (1), Review of last therapy session (1) and Review of psycho-social stressors (1) Data Points:  Review or order clinical lab tests (1) Review of medication regiment & side effects (2)  Levonne Spiller, MD

## 2014-04-28 ENCOUNTER — Ambulatory Visit (HOSPITAL_COMMUNITY): Payer: Self-pay | Admitting: Psychology

## 2014-05-07 ENCOUNTER — Encounter (HOSPITAL_COMMUNITY): Payer: Self-pay | Admitting: Psychology

## 2014-05-07 ENCOUNTER — Ambulatory Visit (INDEPENDENT_AMBULATORY_CARE_PROVIDER_SITE_OTHER): Payer: Medicare Other | Admitting: Psychology

## 2014-05-07 DIAGNOSIS — M797 Fibromyalgia: Secondary | ICD-10-CM

## 2014-05-07 DIAGNOSIS — IMO0001 Reserved for inherently not codable concepts without codable children: Secondary | ICD-10-CM

## 2014-05-07 DIAGNOSIS — F3131 Bipolar disorder, current episode depressed, mild: Secondary | ICD-10-CM

## 2014-05-07 DIAGNOSIS — F419 Anxiety disorder, unspecified: Secondary | ICD-10-CM

## 2014-05-07 DIAGNOSIS — F411 Generalized anxiety disorder: Secondary | ICD-10-CM

## 2014-05-07 NOTE — Progress Notes (Signed)
  Patient:  Katherine Middleton   DOB: 12-12-1951  MR Number: [id number]  Location: [facility] [address] [address] Dept: [phone]  Start: 1 PM End: 2 PM  Provider/Observer:     Edgardo Roys PSYD  Chief Complaint:      Chief Complaint  Patient presents with  . Agitation  . Depression    Reason For Service:     The patient was referred for psychotherapeutic interventions in conjunction with ongoing psychiatric care. The patient has been diagnosed with bipolar disorder but has also had a number of medical issues including gastrointestinal problems, fibromyalgia, and a spastic colon.  Interventions Strategy:  Cognitive/behavioral psychotherapy  Participation Level:   Active  Participation Quality:  Appropriate      Behavioral Observation:  Well Groomed, Alert, and Appropriate.   Current Psychosocial Factors: The patient reports that she has been working on quiting smoking and coping with family.    Content of Session:   Review current symptoms and continued to work on cognitive/behavioral therapeutic interventions.  Current Status:   The patient reports she has been more fatigued and in pain over past few days.  More worry about her boyfriend.  Patient Progress:   Very good  Target Goals:   Target goals include mood stability, better coping skills, keeping issues and symptoms of depression from becoming too problematic and making situations when she becomes hypomanic or manic he from creating difficulties.  Last Reviewed:   05/07/2014  Goals Addressed Today:    We continued to work on issues related to mood stability and better coping skills around recurrent depression and manic/hypomanic events..  Impression/Diagnosis:   The patient has a long-standing history of difficulties with anxiety and significant mood disturbance. She has been diagnosed with a formal diagnosis of  bipolar disorder.  Diagnosis:    Axis I:  Bipolar I disorder, most recent episode (or current) depressed, mild  Fibromyalgia  Anxiety      Axis II: No diagnosis

## 2014-06-07 ENCOUNTER — Ambulatory Visit (INDEPENDENT_AMBULATORY_CARE_PROVIDER_SITE_OTHER): Payer: Medicare Other | Admitting: Psychology

## 2014-06-07 DIAGNOSIS — IMO0001 Reserved for inherently not codable concepts without codable children: Secondary | ICD-10-CM

## 2014-06-07 DIAGNOSIS — F3131 Bipolar disorder, current episode depressed, mild: Secondary | ICD-10-CM

## 2014-06-07 DIAGNOSIS — M797 Fibromyalgia: Secondary | ICD-10-CM

## 2014-06-09 ENCOUNTER — Encounter (HOSPITAL_COMMUNITY): Payer: Self-pay | Admitting: Psychology

## 2014-06-09 NOTE — Progress Notes (Deleted)
Psychiatric Assessment Adult  Patient Identification:  Katherine Middleton Date of Evaluation:  06/09/2014 Chief Complaint: *** History of Chief Complaint:   Chief Complaint  Patient presents with  . Anxiety  . Agitation  . Stress    HPI Review of Systems Physical Exam  Depressive Symptoms: {DEPRESSION SYMPTOMS:20000}  (Hypo) Manic Symptoms:   Elevated Mood:  {BHH YES OR NO:22294} Irritable Mood:  {BHH YES OR NO:22294} Grandiosity:  {BHH YES OR NO:22294} Distractibility:  {BHH YES OR NO:22294} Labiality of Mood:  {BHH YES OR NO:22294} Delusions:  {BHH YES OR NO:22294} Hallucinations:  {BHH YES OR NO:22294} Impulsivity:  {BHH YES OR NO:22294} Sexually Inappropriate Behavior:  {BHH YES OR NO:22294} Financial Extravagance:  {BHH YES OR NO:22294} Flight of Ideas:  {BHH YES OR NO:22294}  Anxiety Symptoms: Excessive Worry:  {BHH YES OR NO:22294} Panic Symptoms:  {BHH YES OR NO:22294} Agoraphobia:  {BHH YES OR NO:22294} Obsessive Compulsive: {BHH YES OR NO:22294}  Symptoms: {Obsessive Compulsive Symptoms:22671} Specific Phobias:  {BHH YES OR NO:22294} Social Anxiety:  {BHH YES OR NO:22294}  Psychotic Symptoms:  Hallucinations: {BHH YES OR NO:22294} {Hallucinations:22672} Delusions:  {BHH YES OR NO:22294} Paranoia:  {BHH YES OR NO:22294}   Ideas of Reference:  {BHH YES OR NO:22294}  PTSD Symptoms: Ever had a traumatic exposure:  {BHH YES OR NO:22294} Had a traumatic exposure in the last month:  {BHH YES OR NO:22294} Re-experiencing: {BHH YES OR NO:22294} {Re-experiencing:22673} Hypervigilance:  {BHH YES OR NO:22294} Hyperarousal: {BHH YES OR NO:22294} {Hyperarousal:22674} Avoidance: {BHH YES OR NO:22294} {Avoidance:22675}  Traumatic Brain Injury: {BHH YES OR NO:22294} {Traumatic Brain Injury:22676}  Past Psychiatric History: Diagnosis: ***  Hospitalizations: ***  Outpatient Care: ***  Substance Abuse Care: ***  Self-Mutilation: ***  Suicidal Attempts: ***  Violent  Behaviors: ***   Past Medical History:   Past Medical History  Diagnosis Date  . Fibromyalgia   . Arthritis   . Osteoarthritis   . Bipolar disorder   . GERD (gastroesophageal reflux disease) 12/31/2002  . Sinusitis 12/31/2012   History of Loss of Consciousness:  {BHH YES OR NO:22294} Seizure History:  {BHH YES OR NO:22294} Cardiac History:  {BHH YES OR NO:22294} Allergies:   Allergies  Allergen Reactions  . Codeine Itching  . Cymbalta [Duloxetine Hcl] Other (See Comments)    GERD  . Oxycodone Itching and Other (See Comments)    "OUT THERE", climbing the walls  . Baclofen Other (See Comments)    Anxiety got much worse and possibly ppted manic episode  . Betadine [Povidone Iodine] Itching    Particularly if in a douche   . Penicillins Swelling  . Sulfa Antibiotics Swelling  . Aspirin Hives and Rash  . Elavil [Amitriptyline] Other (See Comments)    Caused bowels to empty quickly and couldn't sleep on it.  . Latex Other (See Comments)    If in mouth her mouth swells  . Nsaids Nausea Only and Other (See Comments)    Almost passing out, bad acid reflux   Current Medications:  Current Outpatient Prescriptions  Medication Sig Dispense Refill  . ALPRAZolam (XANAX) 0.5 MG tablet Take 1 tablet (0.5 mg total) by mouth 4 (four) times daily as needed.  480 tablet  1  . cetirizine (ZYRTEC) 5 MG tablet Take 5 mg by mouth daily.        . cyclobenzaprine (FLEXERIL) 10 MG tablet Take 10 mg by mouth 3 (three) times daily as needed.      . gabapentin (NEURONTIN) 300 MG capsule Take  by mouth 300 up to 7 times a day for pain anxiety and insomnia  840 capsule  2  . omeprazole (PRILOSEC) 20 MG capsule       . PARoxetine (PAXIL) 40 MG tablet Take 1 tablet (40 mg total) by mouth every morning.  90 tablet  3  . ranitidine (ZANTAC) 150 MG tablet Take 150 mg by mouth 2 (two) times daily.        . simvastatin (ZOCOR) 20 MG tablet Take 20 mg by mouth.      . traZODone (DESYREL) 150 MG tablet Take 1  tablet (150 mg total) by mouth at bedtime.  90 tablet  3  . Vitamin D, Ergocalciferol, (DRISDOL) 50000 UNITS CAPS Take 50,000 Units by mouth every Wednesday.       No current facility-administered medications for this visit.    Previous Psychotropic Medications:  Medication Dose   ***  ***                     Substance Abuse History in the last 12 months: Substance Age of 1st Use Last Use Amount Specific Type  Nicotine  ***  ***  ***  ***  Alcohol  ***  ***  ***  ***  Cannabis  ***  ***  ***  ***  Opiates  ***  ***  ***  ***  Cocaine  ***  ***  ***  ***  Methamphetamines  ***  ***  ***  ***  LSD  ***  ***  ***  ***  Ecstasy  ***   ***  ***  ***  Benzodiazepines  ***  ***  ***  ***  Caffeine  ***  ***  ***  ***  Inhalants  ***  ***  ***  ***  Others:                          Medical Consequences of Substance Abuse: ***  Legal Consequences of Substance Abuse: ***  Family Consequences of Substance Abuse: ***  Blackouts:  {BHH YES OR NO:22294} DT's:  {BHH YES OR NO:22294} Withdrawal Symptoms:  {BHH YES OR NO:22294} {Withdrawal Symptoms:22677}  Social History: Current Place of Residence: *** Place of Birth: *** Family Members: *** Marital Status:  {Marital Status:22678} Children: ***  Sons: ***  Daughters: *** Relationships: *** Education:  {Education:22679} Educational Problems/Performance: *** Religious Beliefs/Practices: *** History of Abuse: {Desc; abuse:16542} Occupational Experiences; Military History:  {Military History:22680} Legal History: *** Hobbies/Interests: ***  Family History:   Family History  Problem Relation Age of Onset  . Bipolar disorder Sister   . Anxiety disorder Sister   . Dementia Sister 39  . Paranoid behavior Sister   . Bipolar disorder Father   . Schizophrenia Father   . Alcohol abuse Maternal Uncle   . Alcohol abuse Paternal Uncle   . Anxiety disorder Mother   . OCD Neg Hx   . Seizures Neg Hx   . Sexual abuse Neg Hx    . Physical abuse Neg Hx   . ADD / ADHD Other   . ADD / ADHD Other   . Healthy Other     Mental Status Examination/Evaluation: Objective:  Appearance: {Appearance:22683}  Eye Contact::  {BHH EYE CONTACT:22684}  Speech:  {Speech:22685}  Volume:  {Volume (PAA):22686}  Mood:  ***  Affect:  {Affect (PAA):22687}  Thought Process:  {Thought Process (PAA):22688}  Orientation:  {BHH ORIENTATION (PAA):22689}  Thought Content:  {Thought Content:22690}  Suicidal  Thoughts:  {ST/HT (PAA):22692}  Homicidal Thoughts:  {ST/HT (PAA):22692}  Judgement:  {Judgement (PAA):22694}  Insight:  {Insight (PAA):22695}  Psychomotor Activity:  {Psychomotor (PAA):22696}  Akathisia:  {BHH YES OR NO:22294}  Handed:  {Handed:22697}  AIMS (if indicated):  ***  Assets:  {Assets (PAA):22698}    Laboratory/X-Ray Psychological Evaluation(s)   ***  ***   Assessment:  {axis diagnosis:3049000}  AXIS I {psych axis 1:31909}  AXIS II {psych axis 2:31910}  AXIS III Past Medical History  Diagnosis Date  . Fibromyalgia   . Arthritis   . Osteoarthritis   . Bipolar disorder   . GERD (gastroesophageal reflux disease) 12/31/2002  . Sinusitis 12/31/2012     AXIS IV {psych axis iv:31915}  AXIS V {psych axis v score:31919}   Treatment Plan/Recommendations:  Plan of Care: ***  Laboratory:  {Laboratory:22682}  Psychotherapy: ***  Medications: ***  Routine PRN Medications:  {BHH YES OR NO:22294}  Consultations: ***  Safety Concerns:  ***  Other:      Marquis Diles R, PsyD 6/10/201511:23 AM

## 2014-06-09 NOTE — Progress Notes (Signed)
  Patient:  Katherine Middleton   DOB: [birth year]  MR Number: [id number]  Location: [facility] [address] [place] [address] Dept: [phone]  Start: 1 PM End: 2 PM  Provider/Observer:     Edgardo Roys PSYD  Chief Complaint:      Chief Complaint  Patient presents with  . Anxiety  . Agitation  . Stress    Reason For Service:     The patient was referred for psychotherapeutic interventions in conjunction with ongoing psychiatric care. The patient has been diagnosed with bipolar disorder but has also had a number of medical issues including gastrointestinal problems, fibromyalgia, and a spastic colon.  Interventions Strategy:  Cognitive/behavioral psychotherapy  Participation Level:   Active  Participation Quality:  Appropriate      Behavioral Observation:  Well Groomed, Alert, and Appropriate.   Current Psychosocial Factors: The patient reports that overall she has been doing better although she had a period of time a couple weeks ago where her mood became more variable. She actively worked on the coping skills we have developed and has gotten more stable. She reports that she's had more difficulty with fibromyalgia but that is also likely due the fact that she is trying to be more socially active and physically active. The patient reports that her relationship with her boyfriend is been going quite well..    Content of Session:   Review current symptoms and continued to work on cognitive/behavioral therapeutic interventions.  Current Status:   The patient reports that her mood has been more stable over the past couple of weeks. The patient reports that her relationship with her boyfriend has been going well but she does continue to worry about his health issues as her husband died from help that she.  Patient Progress:   Very good  Target Goals:   Target goals include mood stability,  better coping skills, keeping issues and symptoms of depression from becoming too problematic and making situations when she becomes hypomanic or manic he from creating difficulties.  Last Reviewed:   06/07/2014  Goals Addressed Today:    We continued to work on issues related to mood stability and better coping skills around recurrent depression and manic/hypomanic events..  Impression/Diagnosis:   The patient has a long-standing history of difficulties with anxiety and significant mood disturbance. She has been diagnosed with a formal diagnosis of bipolar disorder.  Diagnosis:    Axis I:  Bipolar I disorder, most recent episode (or current) depressed, mild  Fibromyalgia      Axis II: No diagnosis

## 2014-06-30 ENCOUNTER — Encounter (HOSPITAL_COMMUNITY): Payer: Self-pay | Admitting: Psychology

## 2014-06-30 ENCOUNTER — Ambulatory Visit (INDEPENDENT_AMBULATORY_CARE_PROVIDER_SITE_OTHER): Payer: Medicare Other | Admitting: Psychology

## 2014-06-30 DIAGNOSIS — F3131 Bipolar disorder, current episode depressed, mild: Secondary | ICD-10-CM

## 2014-06-30 DIAGNOSIS — IMO0001 Reserved for inherently not codable concepts without codable children: Secondary | ICD-10-CM

## 2014-06-30 DIAGNOSIS — F411 Generalized anxiety disorder: Secondary | ICD-10-CM

## 2014-06-30 DIAGNOSIS — M797 Fibromyalgia: Secondary | ICD-10-CM

## 2014-06-30 DIAGNOSIS — F419 Anxiety disorder, unspecified: Secondary | ICD-10-CM

## 2014-06-30 NOTE — Progress Notes (Signed)
  Patient:  Katherine Middleton   DOB: March 25, 1951  MR Number: [id number]  Location: [facility] [address] [place] [address] Dept: [phone]  Start: 1 PM End: 2 PM  Provider/Observer:     Edgardo Roys PSYD  Chief Complaint:      Chief Complaint  Patient presents with  . Agitation  . Anxiety  . Depression  . Stress    Reason For Service:     The patient was referred for psychotherapeutic interventions in conjunction with ongoing psychiatric care. The patient has been diagnosed with bipolar disorder but has also had a number of medical issues including gastrointestinal problems, fibromyalgia, and a spastic colon.  Interventions Strategy:  Cognitive/behavioral psychotherapy  Participation Level:   Active  Participation Quality:  Appropriate      Behavioral Observation:  Well Groomed, Alert, and Appropriate.   Current Psychosocial Factors: The patient reports that she has been forced to be a caretaker to some regard to her boyfriend Ronalee Belts. Her boyfriend is obliterated has difficulty interacting with the healthcare system. She was able to get him onto the [facility] assistance program for health care. However, she has been forced to help him understand a lot of the complexities of the healthcare field to be able to utilize the resources that are now available to him..    Content of Session:   Review current symptoms and continued to work on cognitive/behavioral therapeutic interventions.  Current Status:   The patient reports that her mood has been more stable over the past couple of weeks. The patient reports that her relationship with her boyfriend has been going well but she does continue to worry about his health issues as her husband died from help that she.  Patient Progress:   Very good  Target Goals:   Target goals include mood stability, better coping skills, keeping issues and  symptoms of depression from becoming too problematic and making situations when she becomes hypomanic or manic he from creating difficulties.  Last Reviewed:   06/30/2014  Goals Addressed Today:    We continued to work on issues related to mood stability and better coping skills around recurrent depression and manic/hypomanic events..  Impression/Diagnosis:   The patient has a long-standing history of difficulties with anxiety and significant mood disturbance. She has been diagnosed with a formal diagnosis of bipolar disorder.  Diagnosis:    Axis I:  Bipolar I disorder, most recent episode (or current) depressed, mild  Fibromyalgia  Anxiety      Axis II: No diagnosis

## 2014-07-23 ENCOUNTER — Encounter (HOSPITAL_COMMUNITY): Payer: Self-pay | Admitting: Psychiatry

## 2014-07-23 ENCOUNTER — Ambulatory Visit (INDEPENDENT_AMBULATORY_CARE_PROVIDER_SITE_OTHER): Payer: Medicare Other | Admitting: Psychiatry

## 2014-07-23 VITALS — BP 120/80 | Ht 64.0 in | Wt 161.0 lb

## 2014-07-23 DIAGNOSIS — F319 Bipolar disorder, unspecified: Secondary | ICD-10-CM

## 2014-07-23 DIAGNOSIS — F3131 Bipolar disorder, current episode depressed, mild: Secondary | ICD-10-CM

## 2014-07-23 DIAGNOSIS — F411 Generalized anxiety disorder: Secondary | ICD-10-CM

## 2014-07-23 DIAGNOSIS — M797 Fibromyalgia: Secondary | ICD-10-CM

## 2014-07-23 MED ORDER — TRAZODONE HCL 150 MG PO TABS: 150.0000 mg | ORAL_TABLET | Freq: Every day | ORAL | Status: DC

## 2014-07-23 MED ORDER — ALPRAZOLAM 0.5 MG PO TABS: 0.5000 mg | ORAL_TABLET | Freq: Four times a day (QID) | ORAL | Status: DC | PRN

## 2014-07-23 MED ORDER — PAROXETINE HCL 40 MG PO TABS: 40.0000 mg | ORAL_TABLET | ORAL | Status: DC

## 2014-07-23 MED ORDER — GABAPENTIN 300 MG PO CAPS: ORAL_CAPSULE | ORAL | Status: DC

## 2014-07-23 NOTE — Progress Notes (Signed)
Patient ID: Katherine Middleton, female   DOB: Dec 23, 1951, 63 y.o.   MRN: [id number] Patient ID: Katherine Middleton, female   DOB: [birth year], 63 y.o.   MRN: [id number] Patient ID: Katherine Middleton, female   DOB: 1951/04/28, 63 y.o.   MRN: [id number] Patient ID: Katherine Middleton, female   DOB: 1951/06/01, 63 y.o.   MRN: [id number] [place] [id number] Progress Note Katherine Middleton MRN: [id number] DOB: [birth year] Age: 63 y.o.  Date: 07/23/2014  Chief Complaint  Patient presents with  . Anxiety  . Depression  . Follow-up    History of present illness. Patient is 2 year old Caucasian female who lives with her boyfriend in [place]. Her husband died in 04-01-10. She is on disability for fibromyalgia arthritis and bipolar disorder.  The patient has been seeing a psychiatrist for years. Initially she was diagnosed with depression but eventually she developed some manic symptoms and now is designated as having bipolar disorder. She's often very tired due to her fibromyalgia and has chronic muscle aches and pains. She feels that the medicines she is on now been very helpful. She sleeping well and her mood is stable. She's not using drugs or alcohol denies suicidal ideation. She's never been psychotic but mentions having up "breakdown" about 10 years ago. She's never been hospitalized. She states that her boyfriend is very good to her and "keeps me laughing" which is helped improve her mood dramatically  The patient returns after 3 months. She is doing fairly well. She continues to have some conflicts with her family but his family needs. Her anxiety is well controlled and she is sleeping well. She denies any manic symptoms or suicidal ideation.  Current Outpatient Prescriptions  Medication Sig Dispense Refill  . ALPRAZolam (XANAX) 0.5 MG tablet Take 1 tablet (0.5 mg total) by mouth 4 (four) times daily as needed.  480 tablet  1  . cetirizine (ZYRTEC) 5 MG tablet Take 5 mg by mouth daily.        .  cyclobenzaprine (FLEXERIL) 10 MG tablet Take 10 mg by mouth 3 (three) times daily as needed.      . gabapentin (NEURONTIN) 300 MG capsule Take by mouth 300 up to 7 times a day for pain anxiety and insomnia  840 capsule  2  . omeprazole (PRILOSEC) 20 MG capsule       . PARoxetine (PAXIL) 40 MG tablet Take 1 tablet (40 mg total) by mouth every morning.  90 tablet  3  . ranitidine (ZANTAC) 150 MG tablet Take 150 mg by mouth 2 (two) times daily.        . simvastatin (ZOCOR) 20 MG tablet Take 20 mg by mouth.      . traZODone (DESYREL) 150 MG tablet Take 1 tablet (150 mg total) by mouth at bedtime.  90 tablet  3  . Vitamin D, Ergocalciferol, (DRISDOL) 50000 UNITS CAPS Take 50,000 Units by mouth every Wednesday.       No current facility-administered medications for this visit.    Past psychiatric history Patient has a long history of depression and bipolar disorder started in April 01, 2002.  She has been seeing in this office since then.  She denies any history of suicidal attempt or any inpatient psychiatric treatment however endorse history of severe depression with passive suicidal thinking and anger issues.  She is seeing therapist in this office regularly.  Allergies: Allergies  Allergen Reactions  . Codeine Itching  . Cymbalta [Duloxetine Hcl] Other (See Comments)  GERD  . Oxycodone Itching and Other (See Comments)    "OUT THERE", climbing the walls  . Baclofen Other (See Comments)    Anxiety got much worse and possibly ppted manic episode  . Betadine [Povidone Iodine] Itching    Particularly if in a douche   . Penicillins Swelling  . Sulfa Antibiotics Swelling  . Aspirin Hives and Rash  . Elavil [Amitriptyline] Other (See Comments)    Caused bowels to empty quickly and couldn't sleep on it.  . Latex Other (See Comments)    If in mouth her mouth swells  . Nsaids Nausea Only and Other (See Comments)    Almost passing out, bad acid reflux   Medical History: Past Medical History   Diagnosis Date  . Fibromyalgia   . Arthritis   . Osteoarthritis   . Bipolar disorder   . GERD (gastroesophageal reflux disease) 12/31/2002  . Sinusitis 12/31/2012   Surgical History: Past Surgical History  Procedure Laterality Date  . Tonsillectomy  04/08/57    some date around then  . Tubal ligation  10/21/1984   Family History family history includes ADD / ADHD in her other and other; Alcohol abuse in her maternal uncle and paternal uncle; Anxiety disorder in her mother and sister; Bipolar disorder in her father and sister; Dementia (age of onset: 104) in her sister; Healthy in her other; Paranoid behavior in her sister; Schizophrenia in her father. There is no history of OCD, Seizures, Sexual abuse, or Physical abuse.  Vitals: BP 120/80  Ht 5\' 4"  (1.626 m)  Wt 161 lb (73.029 kg)  BMI 27.62 kg/m2 Wt loss 8 pounds from last visit.  Mental status examination Patient is casually dressed and fairly groomed. She is calm cooperative and maintained good eye contact. Her speech is soft clear and coherent. Her thought process is logical linear and goal-directed. She denies active or passive suicidal thinking and homicidal thinking. She denies any auditory or visual hallucination. Her attention and concentration is fair. She described her mood as good and her affect is mood appropriate. There no psychotic symptoms present at this time. She's alert and oriented x3. Her insight judgment and impulse control is okay.  Lab Results:  No results found for this or any previous visit (from the past 8736 hour(s)). Assessment Axis I bipolar disorder NOS, anxiety, Vitamin D deficiency by history Axis II deferred Axis III see medical history Axis IV mild to moderate  Plan: I reviewed her medication and psychosocial history.  She'll continue all current medications.  Recommend to call us back if she is any question or concern if she feels worse to the symptom.  Recommend to see therapist in this office  regularly.  Followup in 3 months.    MEDICATIONS this encounter: Meds ordered this encounter  Medications  . gabapentin (NEURONTIN) 300 MG capsule    Sig: Take by mouth 300 up to 7 times a day for pain anxiety and insomnia    Dispense:  840 capsule    Refill:  2  . PARoxetine (PAXIL) 40 MG tablet    Sig: Take 1 tablet (40 mg total) by mouth every morning.    Dispense:  90 tablet    Refill:  3  . traZODone (DESYREL) 150 MG tablet    Sig: Take 1 tablet (150 mg total) by mouth at bedtime.    Dispense:  90 tablet    Refill:  3  . ALPRAZolam (XANAX) 0.5 MG tablet    Sig: Take 1  tablet (0.5 mg total) by mouth 4 (four) times daily as needed.    Dispense:  480 tablet    Refill:  1    Medical Decision Making Problem Points:  Established problem, stable/improving (1), Review of last therapy session (1) and Review of psycho-social stressors (1) Data Points:  Review or order clinical lab tests (1) Review of medication regiment & side effects (2)  Camyra Vaeth, MD

## 2014-07-27 ENCOUNTER — Ambulatory Visit (HOSPITAL_COMMUNITY): Payer: Self-pay | Admitting: Psychiatry

## 2014-07-31 ENCOUNTER — Emergency Department (HOSPITAL_COMMUNITY): Payer: Medicare Other

## 2014-07-31 ENCOUNTER — Emergency Department (HOSPITAL_COMMUNITY)
Admission: EM | Admit: 2014-07-31 | Discharge: 2014-07-31 | Disposition: A | Discharge status: Home / Self Care | Payer: Medicare Other | Attending: Emergency Medicine | Admitting: Emergency Medicine

## 2014-07-31 ENCOUNTER — Encounter (HOSPITAL_COMMUNITY): Payer: Self-pay | Admitting: Emergency Medicine

## 2014-07-31 DIAGNOSIS — W1809XA Striking against other object with subsequent fall, initial encounter: Secondary | ICD-10-CM | POA: Insufficient documentation

## 2014-07-31 DIAGNOSIS — S0990XA Unspecified injury of head, initial encounter: Secondary | ICD-10-CM | POA: Diagnosis present

## 2014-07-31 DIAGNOSIS — Z79899 Other long term (current) drug therapy: Secondary | ICD-10-CM | POA: Insufficient documentation

## 2014-07-31 DIAGNOSIS — Y9389 Activity, other specified: Secondary | ICD-10-CM | POA: Insufficient documentation

## 2014-07-31 DIAGNOSIS — Z23 Encounter for immunization: Secondary | ICD-10-CM | POA: Insufficient documentation

## 2014-07-31 DIAGNOSIS — F17200 Nicotine dependence, unspecified, uncomplicated: Secondary | ICD-10-CM | POA: Diagnosis not present

## 2014-07-31 DIAGNOSIS — F319 Bipolar disorder, unspecified: Secondary | ICD-10-CM | POA: Insufficient documentation

## 2014-07-31 DIAGNOSIS — S0100XA Unspecified open wound of scalp, initial encounter: Secondary | ICD-10-CM | POA: Diagnosis not present

## 2014-07-31 DIAGNOSIS — Z880 Allergy status to penicillin: Secondary | ICD-10-CM | POA: Insufficient documentation

## 2014-07-31 DIAGNOSIS — K219 Gastro-esophageal reflux disease without esophagitis: Secondary | ICD-10-CM | POA: Diagnosis not present

## 2014-07-31 DIAGNOSIS — W19XXXA Unspecified fall, initial encounter: Secondary | ICD-10-CM

## 2014-07-31 DIAGNOSIS — M1990 Unspecified osteoarthritis, unspecified site: Secondary | ICD-10-CM | POA: Insufficient documentation

## 2014-07-31 DIAGNOSIS — IMO0002 Reserved for concepts with insufficient information to code with codable children: Secondary | ICD-10-CM | POA: Insufficient documentation

## 2014-07-31 DIAGNOSIS — IMO0001 Reserved for inherently not codable concepts without codable children: Secondary | ICD-10-CM | POA: Insufficient documentation

## 2014-07-31 DIAGNOSIS — Y92009 Unspecified place in unspecified non-institutional (private) residence as the place of occurrence of the external cause: Secondary | ICD-10-CM | POA: Insufficient documentation

## 2014-07-31 MED ORDER — TETANUS-DIPHTH-ACELL PERTUSSIS 5-2.5-18.5 LF-MCG/0.5 IM SUSP
0.5000 mL | Freq: Once | INTRAMUSCULAR | Status: AC
  Administered 2014-07-31: 0.5 mL via INTRAMUSCULAR
  Filled 2014-07-31: qty 0.5

## 2014-07-31 NOTE — ED Notes (Signed)
Pt states she fell backwards from standing position onto bathroom floor. Laceration to back of head, pain to head, sacrum and lower back. denies loc but c/o dizziness.

## 2014-07-31 NOTE — Discharge Instructions (Signed)
Staples out in one week.  Clean hair at least once a day with soap

## 2014-07-31 NOTE — ED Provider Notes (Signed)
CSN: [id number]     Arrival date & time 07/31/14  1402 History  This chart was scribed for Maudry Diego, MD by Girtha Hake, ED Scribe. The patient was seen in [place]. The patient's care was started at 2:39 PM.     Chief Complaint  Patient presents with  . Fall    Patient is a 63 y.o. female presenting with fall. The history is provided by the patient (the pt fell and hit her head.  ). No language interpreter was used.  Fall This is a new problem. The current episode started 1 to 2 hours ago. The problem has not changed since onset.Pertinent negatives include no chest pain, no abdominal pain and no headaches. Nothing aggravates the symptoms. Nothing relieves the symptoms. She has tried nothing for the symptoms.   HPI Comments: Katherine Middleton is a 63 y.o. female who presents to the Emergency Department complaining of a fall that occurred approximately 1 hour ago. She states that she hit her head when she fell but denies LOC. She also reports a small laceration on her scalp and back pain associated with the fall. Patient reports that she fell backwards onto her backthroom floor while trying to shave her legs in the bathtub.   PCP is Dr. Redmond Pulling.  Past Medical History  Diagnosis Date  . Fibromyalgia   . Arthritis   . Osteoarthritis   . Bipolar disorder   . GERD (gastroesophageal reflux disease) 12/31/2002  . Sinusitis 12/31/2012   Past Surgical History  Procedure Laterality Date  . Tonsillectomy  04/08/57    some date around then  . Tubal ligation  10/21/1984   Family History  Problem Relation Age of Onset  . Bipolar disorder Sister   . Anxiety disorder Sister   . Dementia Sister 29  . Paranoid behavior Sister   . Bipolar disorder Father   . Schizophrenia Father   . Alcohol abuse Maternal Uncle   . Alcohol abuse Paternal Uncle   . Anxiety disorder Mother   . OCD Neg Hx   . Seizures Neg Hx   . Sexual abuse Neg Hx   . Physical abuse Neg Hx   . ADD / ADHD Other   . ADD  / ADHD Other   . Healthy Other    History  Substance Use Topics  . Smoking status: Current Every Day Smoker -- 0.50 packs/day for 30 years    Types: Cigarettes  . Smokeless tobacco: Never Used     Comment: 11-15 cigs a day as of 05/01/2013  . Alcohol Use: No   OB History   Grav Para Term Preterm Abortions TAB SAB Ect Mult Living                 Review of Systems  Constitutional: Negative for appetite change and fatigue.  HENT: Negative for congestion, ear discharge and sinus pressure.   Eyes: Negative for discharge.  Respiratory: Negative for cough.   Cardiovascular: Negative for chest pain.  Gastrointestinal: Negative for abdominal pain and diarrhea.  Genitourinary: Negative for frequency and hematuria.  Musculoskeletal: Positive for back pain.  Skin: Positive for wound. Negative for rash.  Neurological: Negative for seizures and headaches.  Psychiatric/Behavioral: Negative for hallucinations.      Allergies  Codeine; Cymbalta; Oxycodone; Baclofen; Betadine; Penicillins; Sulfa antibiotics; Aspirin; Elavil; Latex; and Nsaids  Home Medications   Prior to Admission medications   Medication Sig Start Date End Date Taking? Authorizing Provider  ALPRAZolam Duanne Moron) 0.5 MG tablet  Take 1 tablet (0.5 mg total) by mouth 4 (four) times daily as needed. 07/23/14   Levonne Spiller, MD  cetirizine (ZYRTEC) 5 MG tablet Take 5 mg by mouth daily.      Historical Provider, MD  cyclobenzaprine (FLEXERIL) 10 MG tablet Take 10 mg by mouth 3 (three) times daily as needed. 07/23/13   Aletha Halim, PA-C  gabapentin (NEURONTIN) 300 MG capsule Take by mouth 300 up to 7 times a day for pain anxiety and insomnia 07/23/14   Levonne Spiller, MD  omeprazole (PRILOSEC) 20 MG capsule  09/04/13   Historical Provider, MD  PARoxetine (PAXIL) 40 MG tablet Take 1 tablet (40 mg total) by mouth every morning. 07/23/14   Levonne Spiller, MD  ranitidine (ZANTAC) 150 MG tablet Take 150 mg by mouth 2 (two) times daily.       Historical Provider, MD  simvastatin (ZOCOR) 20 MG tablet Take 20 mg by mouth. 05/28/13   Aletha Halim, PA-C  traZODone (DESYREL) 150 MG tablet Take 1 tablet (150 mg total) by mouth at bedtime. 07/23/14   Levonne Spiller, MD  Vitamin D, Ergocalciferol, (DRISDOL) 50000 UNITS CAPS Take 50,000 Units by mouth every Wednesday.    Historical Provider, MD   There were no vitals taken for this visit. Physical Exam  Constitutional: She is oriented to person, place, and time. She appears well-developed.  HENT:  Head: Normocephalic.  Eyes: Conjunctivae and EOM are normal. No scleral icterus.  Neck: Neck supple. No thyromegaly present.  Cardiovascular: Normal rate and regular rhythm.  Exam reveals no gallop and no friction rub.   No murmur heard. Pulmonary/Chest: No stridor. She has no wheezes. She has no rales. She exhibits no tenderness.  Abdominal: She exhibits no distension. There is no tenderness. There is no rebound.  Musculoskeletal: Normal range of motion. She exhibits tenderness. She exhibits no edema.  Large swelling to the occpiital area of her head with a 1cm laceration.   Tender over sacrum and coccyx   Lymphadenopathy:    She has no cervical adenopathy.  Neurological: She is oriented to person, place, and time. She exhibits normal muscle tone. Coordination normal.  Minor tremor to her head.  Skin: No rash noted. No erythema.  Psychiatric: She has a normal mood and affect. Her behavior is normal.    ED Course  LACERATION REPAIR Date/Time: 07/31/2014 6:19 PM Performed by: Susen Haskew L Authorized by: Milton Ferguson L Comments: 1cm lac to occipital scalp.  Cleaned with alcohol and 2 staples used to close the laceration   (including critical care time) DIAGNOSTIC STUDIES:    COORDINATION OF CARE: 2:43 PM-Discussed treatment plan which includes a head CT without contrast, C-spine CT without contrast, and an x-ray of the sacrum/coccyx with pt at bedside and pt agreed to plan.      Labs Review Labs Reviewed - No data to display  Imaging Review No results found.   EKG Interpretation None      MDM   Final diagnoses:  None   The chart was scribed for me under my direct supervision.  I personally performed the history, physical, and medical decision making and all procedures in the evaluation of this patient.Maudry Diego, MD 07/31/14 [phone]

## 2014-07-31 NOTE — ED Notes (Signed)
NAD noted at time of d/c instruction

## 2014-08-04 ENCOUNTER — Ambulatory Visit (HOSPITAL_COMMUNITY): Payer: Self-pay | Admitting: Psychology

## 2014-08-24 ENCOUNTER — Encounter (HOSPITAL_COMMUNITY): Payer: Self-pay | Admitting: Psychology

## 2014-08-24 ENCOUNTER — Ambulatory Visit (INDEPENDENT_AMBULATORY_CARE_PROVIDER_SITE_OTHER): Payer: Medicare Other | Admitting: Psychology

## 2014-08-24 DIAGNOSIS — F3131 Bipolar disorder, current episode depressed, mild: Secondary | ICD-10-CM

## 2014-08-24 DIAGNOSIS — M797 Fibromyalgia: Secondary | ICD-10-CM

## 2014-08-24 DIAGNOSIS — IMO0001 Reserved for inherently not codable concepts without codable children: Secondary | ICD-10-CM

## 2014-08-24 NOTE — Progress Notes (Signed)
  Patient:  Katherine Middleton   DOB: 01-11-1951  MR Number: [id number]  Location: [facility] [address] [place] [address] Dept: [phone]  Start: 1 PM End: 2 PM  Provider/Observer:     Edgardo Roys PSYD  Chief Complaint:      Chief Complaint  Patient presents with  . Agitation  . Depression  . Anxiety    Reason For Service:     The patient was referred for psychotherapeutic interventions in conjunction with ongoing psychiatric care. The patient has been diagnosed with bipolar disorder but has also had a number of medical issues including gastrointestinal problems, fibromyalgia, and a spastic colon.  Interventions Strategy:  Cognitive/behavioral psychotherapy  Participation Level:   Active  Participation Quality:  Appropriate      Behavioral Observation:  Well Groomed, Alert, and Appropriate.   Current Psychosocial Factors: The patient reports that she had a fall at home and cut back of head.  No significant loss of consciousness.  .    Content of Session:   Review current symptoms and continued to work on cognitive/behavioral therapeutic interventions.  Current Status:   The patient reports that her mood has been more stable over the past couple of weeks. She has recovered from fall.  Mood stable.  Patient Progress:   Very good  Target Goals:   Target goals include mood stability, better coping skills, keeping issues and symptoms of depression from becoming too problematic and making situations when she becomes hypomanic or manic he from creating difficulties.  Last Reviewed:   08/24/2014  Goals Addressed Today:    We continued to work on issues related to mood stability and better coping skills around recurrent depression and manic/hypomanic events..  Impression/Diagnosis:   The patient has a long-standing history of difficulties with anxiety and significant mood  disturbance. She has been diagnosed with a formal diagnosis of bipolar disorder.  Diagnosis:    Axis I:  Bipolar I disorder, most recent episode (or current) depressed, mild  Fibromyalgia      Axis II: No diagnosis

## 2014-09-29 ENCOUNTER — Encounter (HOSPITAL_COMMUNITY): Payer: Self-pay | Admitting: Psychology

## 2014-09-29 ENCOUNTER — Ambulatory Visit (INDEPENDENT_AMBULATORY_CARE_PROVIDER_SITE_OTHER): Payer: Medicare Other | Admitting: Psychology

## 2014-09-29 DIAGNOSIS — F3131 Bipolar disorder, current episode depressed, mild: Secondary | ICD-10-CM

## 2014-09-29 DIAGNOSIS — IMO0001 Reserved for inherently not codable concepts without codable children: Secondary | ICD-10-CM

## 2014-09-29 DIAGNOSIS — M797 Fibromyalgia: Secondary | ICD-10-CM

## 2014-09-29 NOTE — Progress Notes (Signed)
  Patient:  Katherine Middleton   DOB: July 10, 1951  MR Number: [id number]  Location: [facility] [address] [place] [address] Dept: [phone]  Start: 1 PM End: 2 PM  Provider/Observer:     Edgardo Roys PSYD  Chief Complaint:      Chief Complaint  Patient presents with  . Agitation  . Depression  . Anxiety  . Stress    Reason For Service:     The patient was referred for psychotherapeutic interventions in conjunction with ongoing psychiatric care. The patient has been diagnosed with bipolar disorder but has also had a number of medical issues including gastrointestinal problems, fibromyalgia, and a spastic colon.  Interventions Strategy:  Cognitive/behavioral psychotherapy  Participation Level:   Active  Participation Quality:  Appropriate      Behavioral Observation:  Well Groomed, Alert, and Appropriate.   Current Psychosocial Factors: The patient reports that she was able to go on a vacation with her boyfriend and his family She had some trouble with fibro but was able to have a good time.    Content of Session:   Review current symptoms and continued to work on cognitive/behavioral therapeutic interventions.  Current Status:   The patient reports that her mood has continued to be stable with mood.  Patient Progress:   Very good  Target Goals:   Target goals include mood stability, better coping skills, keeping issues and symptoms of depression from becoming too problematic and making situations when she becomes hypomanic or manic he from creating difficulties.  Last Reviewed:   09/29/2014  Goals Addressed Today:    We continued to work on issues related to mood stability and better coping skills around recurrent depression and manic/hypomanic events..  Impression/Diagnosis:   The patient has a long-standing history of difficulties with anxiety and significant mood  disturbance. She has been diagnosed with a formal diagnosis of bipolar disorder.  Diagnosis:    Axis I:  Bipolar I disorder, most recent episode (or current) depressed, mild  Fibromyalgia      Axis II: No diagnosis

## 2014-10-11 ENCOUNTER — Other Ambulatory Visit: Payer: Self-pay | Admitting: Family Medicine

## 2014-10-11 DIAGNOSIS — Z1231 Encounter for screening mammogram for malignant neoplasm of breast: Secondary | ICD-10-CM

## 2014-10-11 DIAGNOSIS — Z780 Asymptomatic menopausal state: Secondary | ICD-10-CM

## 2014-10-21 ENCOUNTER — Ambulatory Visit (HOSPITAL_COMMUNITY): Payer: Self-pay | Admitting: Psychiatry

## 2014-10-29 ENCOUNTER — Ambulatory Visit: Payer: Self-pay

## 2014-10-29 ENCOUNTER — Encounter (HOSPITAL_COMMUNITY): Payer: Self-pay | Admitting: Psychology

## 2014-10-29 ENCOUNTER — Ambulatory Visit (INDEPENDENT_AMBULATORY_CARE_PROVIDER_SITE_OTHER): Payer: Medicare Other | Admitting: Psychology

## 2014-10-29 ENCOUNTER — Other Ambulatory Visit: Payer: Self-pay

## 2014-10-29 DIAGNOSIS — F419 Anxiety disorder, unspecified: Secondary | ICD-10-CM

## 2014-10-29 DIAGNOSIS — M797 Fibromyalgia: Secondary | ICD-10-CM

## 2014-10-29 DIAGNOSIS — F319 Bipolar disorder, unspecified: Secondary | ICD-10-CM

## 2014-10-29 NOTE — Progress Notes (Signed)
  Patient:  Katherine Middleton   DOB: [birth year]  MR Number: [id number]  Location: [facility] [address] [place] [address] Dept: [phone]  Start: 1 PM End: 2 PM  Provider/Observer:     Edgardo Roys PSYD  Chief Complaint:      Chief Complaint  Patient presents with  . Anxiety  . Agitation  . Stress    Reason For Service:     The patient was referred for psychotherapeutic interventions in conjunction with ongoing psychiatric care. The patient has been diagnosed with bipolar disorder but has also had a number of medical issues including gastrointestinal problems, fibromyalgia, and a spastic colon.  Interventions Strategy:  Cognitive/behavioral psychotherapy  Participation Level:   Active  Participation Quality:  Appropriate      Behavioral Observation:  Well Groomed, Alert, and Appropriate.   Current Psychosocial Factors: The patient reports that she has been feeling more anxiety, but thinks it is linked to having more pain that may be beyond the fibro and related to back/nerve root pain.  Sleep has been more poor.    Content of Session:   Review current symptoms and continued to work on cognitive/behavioral therapeutic interventions.  Current Status:   The patient reports that her mood has continued to be stable with mood.  More anxiety related to increase in pain.  Patient Progress:   Very good  Target Goals:   Target goals include mood stability, better coping skills, keeping issues and symptoms of depression from becoming too problematic and making situations when she becomes hypomanic or manic he from creating difficulties.  Last Reviewed:   10/29/2014  Goals Addressed Today:    We continued to work on issues related to mood stability and better coping skills around recurrent depression and manic/hypomanic events..  Impression/Diagnosis:   The patient has a long-standing  history of difficulties with anxiety and significant mood disturbance. She has been diagnosed with a formal diagnosis of bipolar disorder.  Diagnosis:    Axis I:  Fibromyalgia  Bipolar 1 disorder  Anxiety      Axis II: No diagnosis

## 2014-11-11 ENCOUNTER — Encounter (HOSPITAL_COMMUNITY): Payer: Self-pay | Admitting: Psychiatry

## 2014-11-11 ENCOUNTER — Ambulatory Visit (INDEPENDENT_AMBULATORY_CARE_PROVIDER_SITE_OTHER): Payer: Medicare Other | Admitting: Psychiatry

## 2014-11-11 VITALS — BP 127/72 | HR 61 | Ht 64.0 in | Wt 157.0 lb

## 2014-11-11 DIAGNOSIS — F319 Bipolar disorder, unspecified: Secondary | ICD-10-CM

## 2014-11-11 DIAGNOSIS — F419 Anxiety disorder, unspecified: Secondary | ICD-10-CM

## 2014-11-11 DIAGNOSIS — M797 Fibromyalgia: Secondary | ICD-10-CM

## 2014-11-11 MED ORDER — TRAZODONE HCL 150 MG PO TABS: 150.0000 mg | ORAL_TABLET | Freq: Every day | ORAL | Status: DC

## 2014-11-11 MED ORDER — GABAPENTIN 300 MG PO CAPS: ORAL_CAPSULE | ORAL | Status: DC

## 2014-11-11 MED ORDER — PAROXETINE HCL 40 MG PO TABS: 40.0000 mg | ORAL_TABLET | ORAL | Status: DC

## 2014-11-11 MED ORDER — ALPRAZOLAM 0.5 MG PO TABS: 0.5000 mg | ORAL_TABLET | Freq: Four times a day (QID) | ORAL | Status: DC | PRN

## 2014-11-11 MED ORDER — ALPRAZOLAM 0.5 MG PO TABS: 0.5000 mg | ORAL_TABLET | Freq: Four times a day (QID) | ORAL | Status: DC

## 2014-11-11 NOTE — Progress Notes (Signed)
Patient ID: Katherine Middleton, female   DOB: 1951-08-11, 63 y.o.   MRN: [id number] Patient ID: Katherine Middleton, female   DOB: 02/22/1951, 63 y.o.   MRN: [id number] Patient ID: Katherine Middleton, female   DOB: 1951/08/26, 63 y.o.   MRN: [id number] Patient ID: Katherine Middleton, female   DOB: 1951/10/05, 64 y.o.   MRN: [id number] Patient ID: Katherine Middleton, female   DOB: 09/15/1951, 63 y.o.   MRN: [id number] [place] [address] Progress Note [name] MRN: [id number] DOB: 24-Jun-1951 Age: 63 y.o.  Date: 11/11/2014  Chief Complaint  Patient presents with  . Depression  . Manic Behavior  . Follow-up    History of present illness. Patient is 63 year old Caucasian female who lives with her boyfriend in [place]. Her husband died in 04-15-10. She is on disability for fibromyalgia arthritis and bipolar disorder.  The patient has been seeing a psychiatrist for years. Initially she was diagnosed with depression but eventually she developed some manic symptoms and now is designated as having bipolar disorder. She's often very tired due to her fibromyalgia and has chronic muscle aches and pains. She feels that the medicines she is on now been very helpful. She sleeping well and her mood is stable. She's not using drugs or alcohol denies suicidal ideation. She's never been psychotic but mentions having up "breakdown" about 10 years ago. She's never been hospitalized. She states that her boyfriend is very good to her and "keeps me laughing" which is helped improve her mood dramatically  The patient returns after 3 months. She is doing fairly well. She had a fall in July and her bathtub but fortunately did not suffer head injury. Her mood is been stable despite the chronic pain due to fibromyalgia. She feels that her medications are working quite well for her.  Current Outpatient Prescriptions  Medication Sig Dispense Refill  . ALPRAZolam (XANAX) 0.5 MG tablet Take 1 tablet (0.5 mg total) by mouth 4 (four) times  daily as needed. 480 tablet 1  . cetirizine (ZYRTEC) 5 MG tablet Take 10 mg by mouth daily.     Marland Kitchen gabapentin (NEURONTIN) 300 MG capsule Take by mouth 300 up to 7 times a day for pain anxiety and insomnia 840 capsule 2  . methocarbamol (ROBAXIN) 500 MG tablet Take 500 mg by mouth 3 (three) times daily.    Marland Kitchen omeprazole (PRILOSEC) 20 MG capsule Take 20 mg by mouth daily.     Marland Kitchen PARoxetine (PAXIL) 40 MG tablet Take 1 tablet (40 mg total) by mouth every morning. 90 tablet 3  . simvastatin (ZOCOR) 20 MG tablet Take 20 mg by mouth.    . traZODone (DESYREL) 150 MG tablet Take 1 tablet (150 mg total) by mouth at bedtime. 90 tablet 3  . ALPRAZolam (XANAX) 0.5 MG tablet Take 1 tablet (0.5 mg total) by mouth 4 (four) times daily. 120 tablet 0   No current facility-administered medications for this visit.    Past psychiatric history Patient has a long history of depression and bipolar disorder started in Apr 15, 2002.  She has been seeing in this office since then.  She denies any history of suicidal attempt or any inpatient psychiatric treatment however endorse history of severe depression with passive suicidal thinking and anger issues.  She is seeing therapist in this office regularly.  Allergies: Allergies  Allergen Reactions  . Codeine Itching  . Cymbalta [Duloxetine Hcl] Other (See Comments)    GERD  . Oxycodone Itching and Other (  See Comments)    "OUT THERE", climbing the walls  . Baclofen Other (See Comments)    Anxiety got much worse and possibly ppted manic episode  . Betadine [Povidone Iodine] Itching    Particularly if in a douche   . Penicillins Swelling  . Sulfa Antibiotics Swelling  . Aspirin Hives and Rash  . Elavil [Amitriptyline] Other (See Comments)    Caused bowels to empty quickly and couldn't sleep on it.  . Latex Other (See Comments)    If in mouth her mouth swells  . Nsaids Nausea Only and Other (See Comments)    Almost passing out, bad acid reflux   Medical History: Past  Medical History  Diagnosis Date  . Fibromyalgia   . Arthritis   . Osteoarthritis   . Bipolar disorder   . GERD (gastroesophageal reflux disease) 12/31/2002  . Sinusitis 12/31/2012   Surgical History: Past Surgical History  Procedure Laterality Date  . Tonsillectomy  04/08/57    some date around then  . Tubal ligation  10/21/1984   Family History family history includes ADD / ADHD in her other and other; Alcohol abuse in her maternal uncle and paternal uncle; Anxiety disorder in her mother and sister; Bipolar disorder in her father and sister; Dementia (age of onset: 54) in her sister; Healthy in her other; Paranoid behavior in her sister; Schizophrenia in her father. There is no history of OCD, Seizures, Sexual abuse, or Physical abuse.  Vitals: BP 127/72 mmHg  Pulse 61  Ht 5\' 4"  (1.626 m)  Wt 157 lb (71.215 kg)  BMI 26.94 kg/m2 Wt loss 8 pounds from last visit.  Mental status examination Patient is casually dressed and fairly groomed. She is calm cooperative and maintained good eye contact. Her speech is soft clear and coherent. Her thought process is logical linear and goal-directed. She denies active or passive suicidal thinking and homicidal thinking. She denies any auditory or visual hallucination. Her attention and concentration is fair. She described her mood as good and her affect is mood appropriate. There no psychotic symptoms present at this time. She's alert and oriented x3. Her insight judgment and impulse control is okay.  Lab Results:  No results found for this or any previous visit (from the past 8736 hour(s)). Assessment Axis I bipolar disorder NOS, anxiety, Vitamin D deficiency by history Axis II deferred Axis III see medical history Axis IV mild to moderate  Plan: I reviewed her medication and psychosocial history.  She'll continue all current medications.  Recommend to call us back if she is any question or concern if she feels worse to the symptom.  Recommend to  see therapist in this office regularly.  Followup in 3 months.    MEDICATIONS this encounter: Meds ordered this encounter  Medications  . methocarbamol (ROBAXIN) 500 MG tablet    Sig: Take 500 mg by mouth 3 (three) times daily.  Marland Kitchen PARoxetine (PAXIL) 40 MG tablet    Sig: Take 1 tablet (40 mg total) by mouth every morning.    Dispense:  90 tablet    Refill:  3  . traZODone (DESYREL) 150 MG tablet    Sig: Take 1 tablet (150 mg total) by mouth at bedtime.    Dispense:  90 tablet    Refill:  3  . gabapentin (NEURONTIN) 300 MG capsule    Sig: Take by mouth 300 up to 7 times a day for pain anxiety and insomnia    Dispense:  840 capsule  Refill:  2  . ALPRAZolam (XANAX) 0.5 MG tablet    Sig: Take 1 tablet (0.5 mg total) by mouth 4 (four) times daily as needed.    Dispense:  480 tablet    Refill:  1  . ALPRAZolam (XANAX) 0.5 MG tablet    Sig: Take 1 tablet (0.5 mg total) by mouth 4 (four) times daily.    Dispense:  120 tablet    Refill:  0    Medical Decision Making Problem Points:  Established problem, stable/improving (1), Review of last therapy session (1) and Review of psycho-social stressors (1) Data Points:  Review or order clinical lab tests (1) Review of medication regiment & side effects (2)  Loretta Doutt, MD

## 2014-11-12 ENCOUNTER — Ambulatory Visit
Admission: RE | Admit: 2014-11-12 | Discharge: 2014-11-12 | Disposition: A | Discharge status: Home / Self Care | Payer: Medicare Other | Source: Ambulatory Visit | Attending: Family Medicine | Admitting: Family Medicine

## 2014-11-12 DIAGNOSIS — Z1231 Encounter for screening mammogram for malignant neoplasm of breast: Secondary | ICD-10-CM

## 2014-11-12 DIAGNOSIS — Z780 Asymptomatic menopausal state: Secondary | ICD-10-CM

## 2014-12-01 ENCOUNTER — Encounter (HOSPITAL_COMMUNITY): Payer: Self-pay | Admitting: Psychology

## 2014-12-01 ENCOUNTER — Ambulatory Visit (INDEPENDENT_AMBULATORY_CARE_PROVIDER_SITE_OTHER): Payer: Medicare Other | Admitting: Psychology

## 2014-12-01 DIAGNOSIS — F419 Anxiety disorder, unspecified: Secondary | ICD-10-CM

## 2014-12-01 DIAGNOSIS — F319 Bipolar disorder, unspecified: Secondary | ICD-10-CM

## 2014-12-01 NOTE — Progress Notes (Signed)
    Patient:  Katherine Middleton   DOB: Dec 29, 1951  MR Number: [id number]  Location: [facility] [address] [place] [address] Dept: [phone]  Start: 1 PM End: 2 PM  Provider/Observer:     Edgardo Roys PSYD  Chief Complaint:      Chief Complaint  Patient presents with  . Agitation  . Anxiety    Reason For Service:     The patient was referred for psychotherapeutic interventions in conjunction with ongoing psychiatric care. The patient has been diagnosed with bipolar disorder but has also had a number of medical issues including gastrointestinal problems, fibromyalgia, and a spastic colon.  Interventions Strategy:  Cognitive/behavioral psychotherapy  Participation Level:   Active  Participation Quality:  Appropriate      Behavioral Observation:  Well Groomed, Alert, and Appropriate.   Current Psychosocial Factors: The patient reports that she has been feeling more anxiety, but thinks it is linked to having more pain that may be beyond the fibro and related to back/nerve root pain.  Sleep has been more poor.    Content of Session:   Review current symptoms and continued to work on cognitive/behavioral therapeutic interventions.  Current Status:   The patient reports that her mood has continued to be stable with mood.  More anxiety related to increase in pain.  Patient Progress:   Very good  Target Goals:   Target goals include mood stability, better coping skills, keeping issues and symptoms of depression from becoming too problematic and making situations when she becomes hypomanic or manic he from creating difficulties.  Last Reviewed:   12/01/2014  Goals Addressed Today:    We continued to work on issues related to mood stability and better coping skills around recurrent depression and manic/hypomanic events..  Impression/Diagnosis:   The patient has a long-standing history  of difficulties with anxiety and significant mood disturbance. She has been diagnosed with a formal diagnosis of bipolar disorder.  Diagnosis:    Axis I:  Bipolar 1 disorder  Anxiety      Axis II: No diagnosis

## 2015-01-04 ENCOUNTER — Ambulatory Visit (INDEPENDENT_AMBULATORY_CARE_PROVIDER_SITE_OTHER): Payer: Medicare Other | Admitting: Psychology

## 2015-01-04 ENCOUNTER — Encounter (HOSPITAL_COMMUNITY): Payer: Self-pay | Admitting: Psychology

## 2015-01-04 DIAGNOSIS — F419 Anxiety disorder, unspecified: Secondary | ICD-10-CM

## 2015-01-04 DIAGNOSIS — F319 Bipolar disorder, unspecified: Secondary | ICD-10-CM

## 2015-01-04 NOTE — Progress Notes (Signed)
    Patient:  Katherine Middleton   DOB: 11-02-1951  MR Number: [id number]  Location: [facility] [address] [place] [address] Dept: [phone]  Start: 1 PM End: 2 PM  Provider/Observer:     Edgardo Roys PSYD  Chief Complaint:      Chief Complaint  Patient presents with  . Anxiety  . Depression    Reason For Service:     The patient was referred for psychotherapeutic interventions in conjunction with ongoing psychiatric care. The patient has been diagnosed with bipolar disorder but has also had a number of medical issues including gastrointestinal problems, fibromyalgia, and a spastic colon.  Interventions Strategy:  Cognitive/behavioral psychotherapy  Participation Level:   Active  Participation Quality:  Appropriate      Behavioral Observation:  Well Groomed, Alert, and Appropriate.   Current Psychosocial Factors: The patient reports that she has had a good holiday with her sister with the exception of one blow up.  The patient reports that she worked on Radiographer, therapeutic that we worked on and was able to cope will with this situation.    Content of Session:   Review current symptoms and continued to work on cognitive/behavioral therapeutic interventions.  Current Status:   The patient reports that her mood has continued to be stable with mood.  She reports no manic or depression events over the past month.  Patient Progress:   Very good  Target Goals:   Target goals include mood stability, better coping skills, keeping issues and symptoms of depression from becoming too problematic and making situations when she becomes hypomanic or manic he from creating difficulties.  Last Reviewed:   01/04/2015  Goals Addressed Today:    We continued to work on issues related to mood stability and better coping skills around recurrent depression and manic/hypomanic  events..  Impression/Diagnosis:   The patient has a long-standing history of difficulties with anxiety and significant mood disturbance. She has been diagnosed with a formal diagnosis of bipolar disorder.  Diagnosis:    Axis I:  Bipolar 1 disorder  Anxiety      Axis II: No diagnosis

## 2015-02-03 ENCOUNTER — Ambulatory Visit (INDEPENDENT_AMBULATORY_CARE_PROVIDER_SITE_OTHER): Payer: Medicare Other | Admitting: Psychology

## 2015-02-03 ENCOUNTER — Encounter (HOSPITAL_COMMUNITY): Payer: Self-pay | Admitting: Psychology

## 2015-02-03 DIAGNOSIS — F319 Bipolar disorder, unspecified: Secondary | ICD-10-CM

## 2015-02-03 DIAGNOSIS — M797 Fibromyalgia: Secondary | ICD-10-CM

## 2015-02-03 DIAGNOSIS — F419 Anxiety disorder, unspecified: Secondary | ICD-10-CM

## 2015-02-03 NOTE — Progress Notes (Signed)
      Patient:  Katherine Middleton   DOB: 10/08/1951  MR Number: [id number]  Location: [facility] [address] [place] [address] Dept: [phone]  Start: 1 PM End: 2 PM  Provider/Observer:     Edgardo Roys PSYD  Chief Complaint:      Chief Complaint  Patient presents with  . Anxiety  . Depression  . Agitation    Reason For Service:     The patient was referred for psychotherapeutic interventions in conjunction with ongoing psychiatric care. The patient has been diagnosed with bipolar disorder but has also had a number of medical issues including gastrointestinal problems, fibromyalgia, and a spastic colon.  Interventions Strategy:  Cognitive/behavioral psychotherapy  Participation Level:   Active  Participation Quality:  Appropriate      Behavioral Observation:  Well Groomed, Alert, and Appropriate.   Current Psychosocial Factors: The patient reports that she has had to do a lot with boyfriend and his doctors and lawyers appointments.  She reports that this makes her pain worse but she has been getting done what she needs to get done.    Content of Session:   Review current symptoms and continued to work on cognitive/behavioral therapeutic interventions.  Current Status:   The patient reports that her mood has continued to be stable with mood.  She reports no manic or depression events over the past month.  Patient Progress:   Very good  Target Goals:   Target goals include mood stability, better coping skills, keeping issues and symptoms of depression from becoming too problematic and making situations when she becomes hypomanic or manic he from creating difficulties.  Last Reviewed:   02/03/2015  Goals Addressed Today:    We continued to work on issues related to mood stability and better coping skills around recurrent depression and manic/hypomanic  events..  Impression/Diagnosis:   The patient has a long-standing history of difficulties with anxiety and significant mood disturbance. She has been diagnosed with a formal diagnosis of bipolar disorder.  Diagnosis:    Axis I:  Bipolar 1 disorder  Anxiety  Fibromyalgia      Axis II: No diagnosis

## 2015-02-11 ENCOUNTER — Encounter (HOSPITAL_COMMUNITY): Payer: Self-pay | Admitting: Psychiatry

## 2015-02-11 ENCOUNTER — Ambulatory Visit (INDEPENDENT_AMBULATORY_CARE_PROVIDER_SITE_OTHER): Payer: Medicare Other | Admitting: Psychiatry

## 2015-02-11 VITALS — BP 133/56 | HR 60 | Ht 64.0 in | Wt 153.0 lb

## 2015-02-11 DIAGNOSIS — F419 Anxiety disorder, unspecified: Secondary | ICD-10-CM

## 2015-02-11 DIAGNOSIS — F319 Bipolar disorder, unspecified: Secondary | ICD-10-CM

## 2015-02-11 DIAGNOSIS — E559 Vitamin D deficiency, unspecified: Secondary | ICD-10-CM

## 2015-02-11 DIAGNOSIS — M797 Fibromyalgia: Secondary | ICD-10-CM

## 2015-02-11 MED ORDER — TRAZODONE HCL 150 MG PO TABS: 150.0000 mg | ORAL_TABLET | Freq: Every day | ORAL | Status: DC

## 2015-02-11 MED ORDER — GABAPENTIN 300 MG PO CAPS: ORAL_CAPSULE | ORAL | Status: DC

## 2015-02-11 MED ORDER — PAROXETINE HCL 40 MG PO TABS: 40.0000 mg | ORAL_TABLET | ORAL | Status: DC

## 2015-02-11 MED ORDER — ALPRAZOLAM 0.5 MG PO TABS: 0.5000 mg | ORAL_TABLET | Freq: Four times a day (QID) | ORAL | Status: DC | PRN

## 2015-02-11 NOTE — Progress Notes (Signed)
Patient ID: Katherine Middleton, female   DOB: [birth year], 64 y.o.   MRN: [id number] Patient ID: Katherine Middleton, female   DOB: [birth year], 64 y.o.   MRN: [id number] Patient ID: Katherine Middleton, female   DOB: Dec 14, 1951, 64 y.o.   MRN: [id number] Patient ID: Katherine Middleton, female   DOB: Feb 07, 1951, 64 y.o.   MRN: [id number] Patient ID: Katherine Middleton, female   DOB: June 20, 1951, 64 y.o.   MRN: [id number] Patient ID: Katherine Middleton, female   DOB: 11/08/1951, 64 y.o.   MRN: [id number] [place] [address] Progress Note Katherine Middleton MRN: [id number] DOB: August 13, 1951 Age: 64 y.o.  Date: 02/11/2015  Chief Complaint  Patient presents with  . Depression  . Anxiety  . Follow-up    History of present illness. Patient is 27 year old Caucasian female who lives with her boyfriend in [place]. Her husband died in 04/18/2010. She is on disability for fibromyalgia arthritis and bipolar disorder.  The patient has been seeing a psychiatrist for years. Initially she was diagnosed with depression but eventually she developed some manic symptoms and now is designated as having bipolar disorder. She's often very tired due to her fibromyalgia and has chronic muscle aches and pains. She feels that the medicines she is on now been very helpful. She sleeping well and her mood is stable. She's not using drugs or alcohol denies suicidal ideation. She's never been psychotic but mentions having up "breakdown" about 10 years ago. She's never been hospitalized. She states that her boyfriend is very good to her and "keeps me laughing" which is helped improve her mood dramatically  The patient returns after 3 months. She is doing fairly well. She is trying to help her boyfriend get disability and they're meeting with an attorney. Her mood is been pretty good and she's had neither depression nor manic symptoms. She is sleeping well. She has very few complaints today other than chronic pain in her neck and back  Current Outpatient  Prescriptions  Medication Sig Dispense Refill  . ALPRAZolam (XANAX) 0.5 MG tablet Take 1 tablet (0.5 mg total) by mouth 4 (four) times daily as needed. 480 tablet 1  . cetirizine (ZYRTEC) 5 MG tablet Take 10 mg by mouth daily.     Marland Kitchen gabapentin (NEURONTIN) 300 MG capsule Take by mouth 300 up to 7 times a day for pain anxiety and insomnia 840 capsule 2  . methocarbamol (ROBAXIN) 500 MG tablet Take 500 mg by mouth 3 (three) times daily.    Marland Kitchen omeprazole (PRILOSEC) 20 MG capsule Take 20 mg by mouth daily.     Marland Kitchen PARoxetine (PAXIL) 40 MG tablet Take 1 tablet (40 mg total) by mouth every morning. 90 tablet 3  . simvastatin (ZOCOR) 20 MG tablet Take 20 mg by mouth.    . traMADol (ULTRAM) 50 MG tablet Take by mouth every 12 (twelve) hours as needed.    . traZODone (DESYREL) 150 MG tablet Take 1 tablet (150 mg total) by mouth at bedtime. 90 tablet 3   No current facility-administered medications for this visit.    Past psychiatric history Patient has a long history of depression and bipolar disorder started in Apr 18, 2002.  She has been seeing in this office since then.  She denies any history of suicidal attempt or any inpatient psychiatric treatment however endorse history of severe depression with passive suicidal thinking and anger issues.  She is seeing therapist in this office regularly.  Allergies: Allergies  Allergen Reactions  .  Codeine Itching  . Cymbalta [Duloxetine Hcl] Other (See Comments)    GERD  . Oxycodone Itching and Other (See Comments)    "OUT THERE", climbing the walls  . Baclofen Other (See Comments)    Anxiety got much worse and possibly ppted manic episode  . Betadine [Povidone Iodine] Itching    Particularly if in a douche   . Penicillins Swelling  . Sulfa Antibiotics Swelling  . Aspirin Hives and Rash  . Elavil [Amitriptyline] Other (See Comments)    Caused bowels to empty quickly and couldn't sleep on it.  . Latex Other (See Comments)    If in mouth her mouth swells  .  Nsaids Nausea Only and Other (See Comments)    Almost passing out, bad acid reflux   Medical History: Past Medical History  Diagnosis Date  . Fibromyalgia   . Arthritis   . Osteoarthritis   . Bipolar disorder   . GERD (gastroesophageal reflux disease) 12/31/2002  . Sinusitis 12/31/2012   Surgical History: Past Surgical History  Procedure Laterality Date  . Tonsillectomy  04/08/57    some date around then  . Tubal ligation  10/21/1984   Family History family history includes ADD / ADHD in her other and other; Alcohol abuse in her maternal uncle and paternal uncle; Anxiety disorder in her mother and sister; Bipolar disorder in her father and sister; Dementia (age of onset: 95) in her sister; Healthy in her other; Paranoid behavior in her sister; Schizophrenia in her father. There is no history of OCD, Seizures, Sexual abuse, or Physical abuse.  Vitals: BP 133/56 mmHg  Pulse 60  Ht 5\' 4"  (1.626 m)  Wt 153 lb (69.4 kg)  BMI 26.25 kg/m2 Wt loss 8 pounds from last visit.  Mental status examination Patient is casually dressed and fairly groomed. She is calm cooperative and maintained good eye contact. Her speech is soft clear and coherent. Her thought process is logical linear and goal-directed. She denies active or passive suicidal thinking and homicidal thinking. She denies any auditory or visual hallucination. Her attention and concentration is fair. She described her mood as good and her affect is mood appropriate. There no psychotic symptoms present at this time. She's alert and oriented x3. Her insight judgment and impulse control is okay.  Lab Results:  No results found for this or any previous visit (from the past 8736 hour(s)). Assessment Axis I bipolar disorder NOS, anxiety, Vitamin D deficiency by history Axis II deferred Axis III see medical history Axis IV mild to moderate  Plan: I reviewed her medication and psychosocial history.  She'll continue all current medications.   Recommend to call us back if she is any question or concern if she feels worse to the symptom.  Recommend to see therapist in this office regularly.  Followup in 3 months.    MEDICATIONS this encounter: Meds ordered this encounter  Medications  . traMADol (ULTRAM) 50 MG tablet    Sig: Take by mouth every 12 (twelve) hours as needed.  Marland Kitchen PARoxetine (PAXIL) 40 MG tablet    Sig: Take 1 tablet (40 mg total) by mouth every morning.    Dispense:  90 tablet    Refill:  3  . traZODone (DESYREL) 150 MG tablet    Sig: Take 1 tablet (150 mg total) by mouth at bedtime.    Dispense:  90 tablet    Refill:  3  . gabapentin (NEURONTIN) 300 MG capsule    Sig: Take by mouth 300  up to 7 times a day for pain anxiety and insomnia    Dispense:  840 capsule    Refill:  2  . ALPRAZolam (XANAX) 0.5 MG tablet    Sig: Take 1 tablet (0.5 mg total) by mouth 4 (four) times daily as needed.    Dispense:  480 tablet    Refill:  1    Medical Decision Making Problem Points:  Established problem, stable/improving (1), Review of last therapy session (1) and Review of psycho-social stressors (1) Data Points:  Review or order clinical lab tests (1) Review of medication regiment & side effects (2)  ROSS, DEBORAH, MD

## 2015-03-04 ENCOUNTER — Encounter (HOSPITAL_COMMUNITY): Payer: Self-pay | Admitting: Psychology

## 2015-03-04 ENCOUNTER — Ambulatory Visit (INDEPENDENT_AMBULATORY_CARE_PROVIDER_SITE_OTHER): Payer: Medicare Other | Admitting: Psychology

## 2015-03-04 DIAGNOSIS — M797 Fibromyalgia: Secondary | ICD-10-CM

## 2015-03-04 DIAGNOSIS — F319 Bipolar disorder, unspecified: Secondary | ICD-10-CM

## 2015-03-04 DIAGNOSIS — F419 Anxiety disorder, unspecified: Secondary | ICD-10-CM

## 2015-03-04 NOTE — Progress Notes (Signed)
      Patient:  Katherine Middleton   DOB: 23-Aug-1951  MR Number: [id number]  Location: [facility] [address] [place] [address] Dept: [phone]  Start: 1 PM End: 2 PM  Provider/Observer:     Edgardo Roys PSYD  Chief Complaint:      Chief Complaint  Patient presents with  . Anxiety  . Agitation  . Stress    Reason For Service:     The patient was referred for psychotherapeutic interventions in conjunction with ongoing psychiatric care. The patient has been diagnosed with bipolar disorder but has also had a number of medical issues including gastrointestinal problems, fibromyalgia, and a spastic colon.  Interventions Strategy:  Cognitive/behavioral psychotherapy  Participation Level:   Active  Participation Quality:  Appropriate      Behavioral Observation:  Well Groomed, Alert, and Appropriate.   Current Psychosocial Factors: The patient reports that she has been more aggitated lately due to stress around everyday issues.  The patient reports that it has helped that her bf has been in a good mood and doing well.   Content of Session:   Review current symptoms and continued to work on cognitive/behavioral therapeutic interventions.  Current Status:   The patient reports that her mood has been more agitated lately and she is hoping that is not the early symptoms of a springtime manic event. We talked about ways of coping with this and monitoring any changes to catch any mood shifts early.  Patient Progress:   Very good  Target Goals:   Target goals include mood stability, better coping skills, keeping issues and symptoms of depression from becoming too problematic and making situations when she becomes hypomanic or manic he from creating difficulties.  Last Reviewed:   03/04/2015  Goals Addressed Today:    We continued to work on issues related to mood stability and better  coping skills around recurrent depression and manic/hypomanic events..  Impression/Diagnosis:   The patient has a long-standing history of difficulties with anxiety and significant mood disturbance. She has been diagnosed with a formal diagnosis of bipolar disorder.  Diagnosis:    Axis I:  Bipolar 1 disorder  Fibromyalgia  Anxiety      Axis II: No diagnosis

## 2015-03-08 ENCOUNTER — Telehealth (HOSPITAL_COMMUNITY): Payer: Self-pay | Admitting: *Deleted

## 2015-03-08 ENCOUNTER — Other Ambulatory Visit (HOSPITAL_COMMUNITY): Payer: Self-pay | Admitting: Psychiatry

## 2015-03-08 MED ORDER — ALPRAZOLAM 0.5 MG PO TABS
0.5000 mg | ORAL_TABLET | Freq: Four times a day (QID) | ORAL | Status: DC | PRN
Start: 2015-03-08 — End: 2015-05-26

## 2015-03-08 NOTE — Telephone Encounter (Signed)
Pt called stating she mailed in her Alprazolam to her mail order pharmacy about 2 wks ago and still have not received it. Per pt she called them and they informed her that they have not received her script and they do not have any script on file for her. Per pt she do not know what to do. Per pt she only have 4 tablets left. Per pt, she will no longer use them again and wanted to know if Dr. Harrington Challenger would write her another script to take to her local pharmacy. Pt number is [phone]. Pt medication was last printed 02-11-15 with quantity of 480 with 1 refills.

## 2015-03-08 NOTE — Telephone Encounter (Signed)
[email or web address]

## 2015-03-08 NOTE — Telephone Encounter (Signed)
PATIENT RETURNED YOUR PHONE CALL. 

## 2015-03-08 NOTE — Telephone Encounter (Signed)
Printed 3 month supply that she can pick up and take to pharmacy

## 2015-03-09 ENCOUNTER — Telehealth (HOSPITAL_COMMUNITY): Payer: Self-pay | Admitting: *Deleted

## 2015-03-09 NOTE — Telephone Encounter (Signed)
Pt is aware that her printed script is ready for pick up and will come by office to pick it up

## 2015-03-09 NOTE — Telephone Encounter (Signed)
Called pt back.

## 2015-03-09 NOTE — Telephone Encounter (Signed)
PT CAME TO PICK UP HER PRINTED SCRIPT. PT AGREED WITH SCRIPT.

## 2015-04-07 ENCOUNTER — Ambulatory Visit (INDEPENDENT_AMBULATORY_CARE_PROVIDER_SITE_OTHER): Payer: Medicare Other | Admitting: Psychology

## 2015-04-07 ENCOUNTER — Encounter (HOSPITAL_COMMUNITY): Payer: Self-pay | Admitting: Psychology

## 2015-04-07 DIAGNOSIS — M797 Fibromyalgia: Secondary | ICD-10-CM | POA: Diagnosis not present

## 2015-04-07 DIAGNOSIS — F319 Bipolar disorder, unspecified: Secondary | ICD-10-CM

## 2015-04-07 NOTE — Progress Notes (Signed)
      Patient:  Katherine Middleton   DOB: 1951/06/12  MR Number: [id number]  Location: [facility] [address] [place] [address] Dept: [phone]  Start: 1 PM End: 2 PM  Provider/Observer:     Edgardo Roys PSYD  Chief Complaint:      Chief Complaint  Patient presents with  . Anxiety  . Depression    Reason For Service:     The patient was referred for psychotherapeutic interventions in conjunction with ongoing psychiatric care. The patient has been diagnosed with bipolar disorder but has also had a number of medical issues including gastrointestinal problems, fibromyalgia, and a spastic colon.  Interventions Strategy:  Cognitive/behavioral psychotherapy  Participation Level:   Active  Participation Quality:  Appropriate      Behavioral Observation:  Well Groomed, Alert, and Appropriate.   Current Psychosocial Factors: The patient reports that she has been having difficulty with her bladder including pain.  She has had limited    Content of Session:   Review current symptoms and continued to work on cognitive/behavioral therapeutic interventions.  Current Status:   The patient reports that her mood has been more depressed, but she thinks it is due more to her not feeling well with her bladder.  Patient Progress:   Very good  Target Goals:   Target goals include mood stability, better coping skills, keeping issues and symptoms of depression from becoming too problematic and making situations when she becomes hypomanic or manic he from creating difficulties.  Last Reviewed:   04/07/2015  Goals Addressed Today:    We continued to work on issues related to mood stability and better coping skills around recurrent depression and manic/hypomanic events..  Impression/Diagnosis:   The patient has a long-standing history of difficulties with anxiety and significant mood disturbance. She  has been diagnosed with a formal diagnosis of bipolar disorder.  Diagnosis:    Axis I:  Bipolar 1 disorder  Fibromyalgia      Axis II: No diagnosis

## 2015-05-05 ENCOUNTER — Ambulatory Visit (HOSPITAL_COMMUNITY): Payer: Self-pay | Admitting: Psychology

## 2015-05-12 ENCOUNTER — Ambulatory Visit (HOSPITAL_COMMUNITY): Payer: Self-pay | Admitting: Psychiatry

## 2015-05-24 ENCOUNTER — Encounter (HOSPITAL_COMMUNITY): Payer: Self-pay | Admitting: Psychology

## 2015-05-24 ENCOUNTER — Ambulatory Visit (INDEPENDENT_AMBULATORY_CARE_PROVIDER_SITE_OTHER): Payer: Medicare Other | Admitting: Psychology

## 2015-05-24 DIAGNOSIS — M797 Fibromyalgia: Secondary | ICD-10-CM | POA: Diagnosis not present

## 2015-05-24 DIAGNOSIS — F319 Bipolar disorder, unspecified: Secondary | ICD-10-CM

## 2015-05-24 NOTE — Psych (Signed)
      Patient: Katherine Middleton  DOB:12-17-1951  MR WEXHBZ:[id number]  [facility] [address] [place] [address] Dept: [phone]  Start:1 PM End:2 PM  Provider/Observer: Edgardo Roys PSYD  Chief Complaint:  Chief Complaint  Patient presents with  . Anxiety  . Depression    Reason For Service: The patient was referred for psychotherapeutic interventions in conjunction with ongoing psychiatric care. The patient has been diagnosed with bipolar disorder but has also had a number of medical issues including gastrointestinal problems, fibromyalgia, and a spastic colon.  Interventions Strategy:Cognitive/behavioral psychotherapy  Participation Level:Active  Participation Quality:Appropriate   Behavioral Observation:Well Groomed, Alert, and Appropriate.   Current Psychosocial Factors:The patient reports that she    Content of Session:Review current symptoms and continued to work on cognitive/behavioral therapeutic interventions.  Current Status:The patient reports that her mood has been more depressed, but she thinks it is due more to her not feeling well with her bladder.  Patient Progress:Very good  Target Goals:Target goals include mood stability, better coping skills, keeping issues and symptoms of depression from becoming too problematic and making situations when she becomes hypomanic or manic he from creating  difficulties.  Last Reviewed:05/24/2015  Goals Addressed Today: We continued to work on issues related to mood stability and better coping skills around recurrent depression and manic/hypomanic events..  Impression/Diagnosis:The patient has a long-standing history of difficulties with anxiety and significant mood disturbance. She has been diagnosed with a formal diagnosis of bipolar disorder.  Diagnosis:    Axis I: Bipolar 1 disorder  Fibromyalgia                [name] R, PsyD 05/24/2015

## 2015-05-26 ENCOUNTER — Encounter (HOSPITAL_COMMUNITY): Payer: Self-pay | Admitting: Psychiatry

## 2015-05-26 ENCOUNTER — Ambulatory Visit (INDEPENDENT_AMBULATORY_CARE_PROVIDER_SITE_OTHER): Payer: Medicare Other | Admitting: Psychiatry

## 2015-05-26 VITALS — BP 98/60 | HR 68 | Ht 64.0 in | Wt 143.6 lb

## 2015-05-26 DIAGNOSIS — F319 Bipolar disorder, unspecified: Secondary | ICD-10-CM | POA: Diagnosis not present

## 2015-05-26 DIAGNOSIS — M797 Fibromyalgia: Secondary | ICD-10-CM | POA: Diagnosis not present

## 2015-05-26 DIAGNOSIS — F419 Anxiety disorder, unspecified: Secondary | ICD-10-CM | POA: Diagnosis not present

## 2015-05-26 MED ORDER — GABAPENTIN 300 MG PO CAPS: ORAL_CAPSULE | ORAL | Status: DC

## 2015-05-26 MED ORDER — TRAZODONE HCL 150 MG PO TABS: 150.0000 mg | ORAL_TABLET | Freq: Every day | ORAL | Status: DC

## 2015-05-26 MED ORDER — PAROXETINE HCL 40 MG PO TABS
40.0000 mg | ORAL_TABLET | ORAL | Status: DC
Start: 2015-05-26 — End: 2015-08-26

## 2015-05-26 MED ORDER — ALPRAZOLAM 0.5 MG PO TABS: 0.5000 mg | ORAL_TABLET | Freq: Four times a day (QID) | ORAL | Status: DC | PRN

## 2015-05-26 NOTE — Progress Notes (Signed)
Patient ID: Katherine Middleton, female   DOB: Apr 20, 1951, 64 y.o.   MRN: [id number] Patient ID: Katherine Middleton, female   DOB: 1951/12/11, 64 y.o.   MRN: [id number] Patient ID: Katherine Middleton, female   DOB: 11-17-1951, 64 y.o.   MRN: [id number] Patient ID: Katherine Middleton, female   DOB: 1951/02/19, 64 y.o.   MRN: [id number] Patient ID: Katherine Middleton, female   DOB: [birth year], 64 y.o.   MRN: [id number] Patient ID: Katherine Middleton, female   DOB: [birth year], 64 y.o.   MRN: [id number] Patient ID: Katherine Middleton, female   DOB: 1951/04/15, 64 y.o.   MRN: [id number] [place] [id number] Progress Note Katherine Middleton MRN: [id number] DOB: 13-Nov-1951 Age: 64 y.o.  Date: 05/26/2015  Chief Complaint  Patient presents with  . Depression  . Fatigue    History of present illness. Patient is 64 year old Caucasian female who lives with her boyfriend in [place]. Her husband died in 04/04/2010. She is on disability for fibromyalgia arthritis and bipolar disorder.  The patient has been seeing a psychiatrist for years. Initially she was diagnosed with depression but eventually she developed some manic symptoms and now is designated as having bipolar disorder. She's often very tired due to her fibromyalgia and has chronic muscle aches and pains. She feels that the medicines she is on now been very helpful. She sleeping well and her mood is stable. She's not using drugs or alcohol denies suicidal ideation. She's never been psychotic but mentions having up "breakdown" about 10 years ago. She's never been hospitalized. She states that her boyfriend is very good to her and "keeps me laughing" which is helped improve her mood dramatically  The patient returns after 3 months. She is doing fairly well but she fell recently after getting up from a chair. Her blood pressure is low today and I strongly suggested she speak to her family doctor about this. She takes a lot of gabapentin a day sometimes as much is 2100 mg and I suggested she  keep it to 1500 mg because this medicine can cause hypotension. Her mood is generally stable and she's not had manic or depressive symptoms. She still feels like her medications are effective  Current Outpatient Prescriptions  Medication Sig Dispense Refill  . ALPRAZolam (XANAX) 0.5 MG tablet Take 1 tablet (0.5 mg total) by mouth 4 (four) times daily as needed. 360 tablet 1  . calcium carbonate (TITRALAC) 420 MG CHEW chewable tablet Chew 420 mg by mouth daily.    Marland Kitchen gabapentin (NEURONTIN) 300 MG capsule Take one twice a day and three at bedtime 450 capsule 2  . loratadine (CLARITIN) 10 MG tablet Take 10 mg by mouth daily.    . methocarbamol (ROBAXIN) 500 MG tablet Take 500 mg by mouth 3 (three) times daily.    . mometasone (NASONEX) 50 MCG/ACT nasal spray Place 2 sprays into the nose daily.    Marland Kitchen omeprazole (PRILOSEC) 20 MG capsule Take 20 mg by mouth daily.     Marland Kitchen PARoxetine (PAXIL) 40 MG tablet Take 1 tablet (40 mg total) by mouth every morning. 90 tablet 3  . traMADol (ULTRAM) 50 MG tablet Take by mouth every 12 (twelve) hours as needed.    . traZODone (DESYREL) 150 MG tablet Take 1 tablet (150 mg total) by mouth at bedtime. 90 tablet 3  . simvastatin (ZOCOR) 20 MG tablet Take 20 mg by mouth.     No current facility-administered medications for this visit.  Past psychiatric history Patient has a long history of depression and bipolar disorder started in 2003.  She has been seeing in this office since then.  She denies any history of suicidal attempt or any inpatient psychiatric treatment however endorse history of severe depression with passive suicidal thinking and anger issues.  She is seeing therapist in this office regularly.  Allergies: Allergies  Allergen Reactions  . Codeine Itching  . Cymbalta [Duloxetine Hcl] Other (See Comments)    GERD  . Oxycodone Itching and Other (See Comments)    "OUT THERE", climbing the walls  . Baclofen Other (See Comments)    Anxiety got much worse  and possibly ppted manic episode  . Betadine [Povidone Iodine] Itching    Particularly if in a douche   . Penicillins Swelling  . Sulfa Antibiotics Swelling  . Aspirin Hives and Rash  . Elavil [Amitriptyline] Other (See Comments)    Caused bowels to empty quickly and couldn't sleep on it.  . Latex Other (See Comments)    If in mouth her mouth swells  . Nsaids Nausea Only and Other (See Comments)    Almost passing out, bad acid reflux   Medical History: Past Medical History  Diagnosis Date  . Fibromyalgia   . Arthritis   . Osteoarthritis   . Bipolar disorder   . GERD (gastroesophageal reflux disease) 12/31/2002  . Sinusitis 12/31/2012   Surgical History: Past Surgical History  Procedure Laterality Date  . Tonsillectomy  04/08/57    some date around then  . Tubal ligation  10/21/1984   Family History family history includes ADD / ADHD in her other and other; Alcohol abuse in her maternal uncle and paternal uncle; Anxiety disorder in her mother and sister; Bipolar disorder in her father and sister; Dementia (age of onset: 36) in her sister; Healthy in her other; Paranoid behavior in her sister; Schizophrenia in her father. There is no history of OCD, Seizures, Sexual abuse, or Physical abuse.  Vitals: BP 98/60 mmHg  Pulse 68  Ht 5\' 4"  (1.626 m)  Wt 143 lb 9.6 oz (65.137 kg)  BMI 24.64 kg/m2 Wt loss 10 pounds from last visit.   review of systems:  Positive for falling lightheadedness fatigue otherwise negative  Mental status examination Patient is casually dressed and fairly groomed. She has the remnants of a black eye under her left eye which she states are from the fall She is calm cooperative and maintained good eye contact. Her speech is soft clear and coherent. Her thought process is logical linear and goal-directed. She denies active or passive suicidal thinking and homicidal thinking. She denies any auditory or visual hallucination. Her attention and concentration is fair.  Her memory functions are within normal limits She described her mood as good and her affect is mood appropriate. There no psychotic symptoms present at this time. She's alert and oriented x3. Her insight judgment and impulse control is okay. He has no evidence of akathisia or movement disorder  Lab Results:  No results found for this or any previous visit (from the past 8736 hour(s)). Assessment Axis I bipolar disorder NOS, anxiety, Vitamin D deficiency by history Axis II deferred Axis III see medical history Axis IV mild to moderate  Plan: I reviewed her medication and psychosocial history.  She'll continue Paxil daily for depression, Neurontin for anxiety and fibromyalgia symptoms trazodone for insomnia and Xanax for anxiety  Recommend to call us back if she is any question or concern if she feels  worse to the symptom.  Recommend to see therapist in this office regularly.  Followup in 3 months.    MEDICATIONS this encounter: Meds ordered this encounter  Medications  . loratadine (CLARITIN) 10 MG tablet    Sig: Take 10 mg by mouth daily.  . mometasone (NASONEX) 50 MCG/ACT nasal spray    Sig: Place 2 sprays into the nose daily.  . calcium carbonate (TITRALAC) 420 MG CHEW chewable tablet    Sig: Chew 420 mg by mouth daily.  Marland Kitchen PARoxetine (PAXIL) 40 MG tablet    Sig: Take 1 tablet (40 mg total) by mouth every morning.    Dispense:  90 tablet    Refill:  3  . traZODone (DESYREL) 150 MG tablet    Sig: Take 1 tablet (150 mg total) by mouth at bedtime.    Dispense:  90 tablet    Refill:  3  . gabapentin (NEURONTIN) 300 MG capsule    Sig: Take one twice a day and three at bedtime    Dispense:  450 capsule    Refill:  2  . ALPRAZolam (XANAX) 0.5 MG tablet    Sig: Take 1 tablet (0.5 mg total) by mouth 4 (four) times daily as needed.    Dispense:  360 tablet    Refill:  1    Medical Decision Making Problem Points:  Established problem, stable/improving (1), Review of last therapy  session (1) and Review of psycho-social stressors (1) Data Points:  Review or order clinical lab tests (1) Review of medication regiment & side effects (2)  ROSS, DEBORAH, MD

## 2015-06-20 ENCOUNTER — Ambulatory Visit (INDEPENDENT_AMBULATORY_CARE_PROVIDER_SITE_OTHER): Payer: Medicare Other | Admitting: Psychology

## 2015-06-20 ENCOUNTER — Encounter (HOSPITAL_COMMUNITY): Payer: Self-pay | Admitting: Psychology

## 2015-06-20 DIAGNOSIS — F319 Bipolar disorder, unspecified: Secondary | ICD-10-CM | POA: Diagnosis not present

## 2015-06-20 DIAGNOSIS — M797 Fibromyalgia: Secondary | ICD-10-CM | POA: Diagnosis not present

## 2015-06-20 NOTE — Progress Notes (Signed)
      Patient:  Katherine Middleton   DOB: [birth year]  MR Number: [id number]  Location: [name] [address] [place] [address] Dept: [phone]  Start: 1 PM End: 2 PM  Provider/Observer:     Edgardo Roys PSYD  Chief Complaint:      Chief Complaint  Patient presents with  . Anxiety  . Stress    Reason For Service:     The patient was referred for psychotherapeutic interventions in conjunction with ongoing psychiatric care. The patient has been diagnosed with bipolar disorder but has also had a number of medical issues including gastrointestinal problems, fibromyalgia, and a spastic colon.  Interventions Strategy:  Cognitive/behavioral psychotherapy  Participation Level:   Active  Participation Quality:  Appropriate      Behavioral Observation:  Well Groomed, Alert, and Appropriate.   Current Psychosocial Factors: The patient reports that her boyfriend had a severe alergic reaction to his cardio meds.  He was hosptialized due to this and patient really feared for his well being.  He was discharged one week ago and doing better now.  She is doing better but still stressed.   Content of Session:   Review current symptoms and continued to work on cognitive/behavioral therapeutic interventions.  Current Status:   The patient reports that her mood had been doing better up to the event with bf.  The patient has been very stressed by this but doing better now.  Patient Progress:   Very good  Target Goals:   Target goals include mood stability, better coping skills, keeping issues and symptoms of depression from becoming too problematic and making situations when she becomes hypomanic or manic he from creating difficulties.  Last Reviewed:   06/20/2015  Goals Addressed Today:    We continued to work on issues related to mood stability and better coping skills around recurrent depression  and manic/hypomanic events..  Impression/Diagnosis:   The patient has a long-standing history of difficulties with anxiety and significant mood disturbance. She has been diagnosed with a formal diagnosis of bipolar disorder.  Diagnosis:    Axis I:  Bipolar 1 disorder  Fibromyalgia      Axis II: No diagnosis

## 2015-07-20 ENCOUNTER — Ambulatory Visit (INDEPENDENT_AMBULATORY_CARE_PROVIDER_SITE_OTHER): Payer: Medicare Other | Admitting: Psychology

## 2015-07-20 DIAGNOSIS — M797 Fibromyalgia: Secondary | ICD-10-CM | POA: Diagnosis not present

## 2015-07-20 DIAGNOSIS — F319 Bipolar disorder, unspecified: Secondary | ICD-10-CM | POA: Diagnosis not present

## 2015-07-20 NOTE — Progress Notes (Signed)
      Patient:  Katherine Middleton   DOB: 11/04/1951  MR Number: [id number]  Location: [facility] [address] [place] [address] Dept: [phone]  Start: 1 PM End: 2 PM  Provider/Observer:     Edgardo Roys PSYD  Chief Complaint:      Chief Complaint  Patient presents with  . Anxiety  . Depression  . Stress    Reason For Service:     The patient was referred for psychotherapeutic interventions in conjunction with ongoing psychiatric care. The patient has been diagnosed with bipolar disorder but has also had a number of medical issues including gastrointestinal problems, fibromyalgia, and a spastic colon.  Interventions Strategy:  Cognitive/behavioral psychotherapy  Participation Level:   Active  Participation Quality:  Appropriate      Behavioral Observation:  Well Groomed, Alert, and Appropriate.   Current Psychosocial Factors: The patient reports that she has taken care of her son's step kids some as well as other activity lately.  This has helped but has also stressed her.   Content of Session:   Review current symptoms and continued to work on cognitive/behavioral therapeutic interventions.  Current Status:   The patient reports that her mood had been doing better up to the event with bf.  The patient has been very stressed by this but doing better now.  Patient Progress:   Very good  Target Goals:   Target goals include mood stability, better coping skills, keeping issues and symptoms of depression from becoming too problematic and making situations when she becomes hypomanic or manic he from creating difficulties.  Last Reviewed:   07/20/2015  Goals Addressed Today:    We continued to work on issues related to mood stability and better coping skills around recurrent depression and manic/hypomanic events..  Impression/Diagnosis:   The patient has a long-standing history  of difficulties with anxiety and significant mood disturbance. She has been diagnosed with a formal diagnosis of bipolar disorder.  Diagnosis:    Axis I:  Bipolar 1 disorder  Fibromyalgia      Axis II: No diagnosis

## 2015-08-23 ENCOUNTER — Ambulatory Visit (INDEPENDENT_AMBULATORY_CARE_PROVIDER_SITE_OTHER): Payer: Medicare Other | Admitting: Psychology

## 2015-08-23 DIAGNOSIS — F319 Bipolar disorder, unspecified: Secondary | ICD-10-CM

## 2015-08-23 DIAGNOSIS — M797 Fibromyalgia: Secondary | ICD-10-CM

## 2015-08-26 ENCOUNTER — Ambulatory Visit (INDEPENDENT_AMBULATORY_CARE_PROVIDER_SITE_OTHER): Payer: Medicare Other | Admitting: Psychiatry

## 2015-08-26 ENCOUNTER — Encounter (HOSPITAL_COMMUNITY): Payer: Self-pay | Admitting: Psychiatry

## 2015-08-26 VITALS — BP 134/62 | HR 65 | Ht 64.0 in | Wt 140.8 lb

## 2015-08-26 DIAGNOSIS — M797 Fibromyalgia: Secondary | ICD-10-CM | POA: Diagnosis not present

## 2015-08-26 DIAGNOSIS — F419 Anxiety disorder, unspecified: Secondary | ICD-10-CM | POA: Diagnosis not present

## 2015-08-26 DIAGNOSIS — F319 Bipolar disorder, unspecified: Secondary | ICD-10-CM | POA: Diagnosis not present

## 2015-08-26 MED ORDER — PAROXETINE HCL 40 MG PO TABS
40.0000 mg | ORAL_TABLET | ORAL | Status: DC
Start: 2015-08-26 — End: 2015-12-29

## 2015-08-26 MED ORDER — ALPRAZOLAM 0.5 MG PO TABS: 0.5000 mg | ORAL_TABLET | Freq: Four times a day (QID) | ORAL | Status: DC | PRN

## 2015-08-26 MED ORDER — TRAZODONE HCL 150 MG PO TABS: 150.0000 mg | ORAL_TABLET | Freq: Every day | ORAL | Status: DC

## 2015-08-26 MED ORDER — GABAPENTIN 300 MG PO CAPS: ORAL_CAPSULE | ORAL | Status: DC

## 2015-08-26 NOTE — Progress Notes (Signed)
Patient ID: AURILLA COULIBALY, female   DOB: [birth year], 64 y.o.   MRN: [id number] Patient ID: ROBY SPALLA, female   DOB: [birth year], 64 y.o.   MRN: [id number] Patient ID: YATZIRY DEAKINS, female   DOB: Jul 04, 1951, 64 y.o.   MRN: [id number] Patient ID: RETTIE LAIRD, female   DOB: [birth year], 64 y.o.   MRN: [id number] Patient ID: MARJAN ROSMAN, female   DOB: 1951-05-13, 64 y.o.   MRN: [id number] Patient ID: IMANII GOSDIN, female   DOB: 06/02/1951, 64 y.o.   MRN: [id number] Patient ID: JESSELYN RASK, female   DOB: [birth year], 64 y.o.   MRN: [id number] Patient ID: SUNSHYNE HORVATH, female   DOB: 1951/05/15, 64 y.o.   MRN: [id number] [place] [address] Progress Note PERSEPHANIE LAATSCH MRN: [id number] DOB: 1951/12/17 Age: 64 y.o.  Date: 08/26/2015  Chief Complaint  Patient presents with  . Depression  . Anxiety  . Follow-up    History of present illness. Patient is 64 year old Caucasian female who lives with her boyfriend in [place]. Her husband died in 04/27/2010. She is on disability for fibromyalgia arthritis and bipolar disorder.  The patient has been seeing a psychiatrist for years. Initially she was diagnosed with depression but eventually she developed some manic symptoms and now is designated as having bipolar disorder. She's often very tired due to her fibromyalgia and has chronic muscle aches and pains. She feels that the medicines she is on now been very helpful. She sleeping well and her mood is stable. She's not using drugs or alcohol denies suicidal ideation. She's never been psychotic but mentions having up "breakdown" about 10 years ago. She's never been hospitalized. She states that her boyfriend is very good to her and "keeps me laughing" which is helped improve her mood dramatically  The patient returns after 3 months. She is doing fairly well. I noted she had lost about 20 pounds over the past year and she claims she's just not hungry. Her primary doctor is aware of it and not  particular concerned. Her mood is been stable and she is sleeping well. Her fibromyalgia tends to act up when she is too active. The Neurontin is helping with this.  Current Outpatient Prescriptions  Medication Sig Dispense Refill  . ALPRAZolam (XANAX) 0.5 MG tablet Take 1 tablet (0.5 mg total) by mouth 4 (four) times daily as needed. 360 tablet 1  . calcium carbonate (TITRALAC) 420 MG CHEW chewable tablet Chew 420 mg by mouth daily.    Marland Kitchen gabapentin (NEURONTIN) 300 MG capsule Take one twice a day and three at bedtime 450 capsule 2  . loratadine (CLARITIN) 10 MG tablet Take 10 mg by mouth daily.    . methocarbamol (ROBAXIN) 500 MG tablet Take 500 mg by mouth 3 (three) times daily.    . mometasone (NASONEX) 50 MCG/ACT nasal spray Place 2 sprays into the nose as needed.     Marland Kitchen omeprazole (PRILOSEC) 20 MG capsule Take 20 mg by mouth daily.     Marland Kitchen PARoxetine (PAXIL) 40 MG tablet Take 1 tablet (40 mg total) by mouth every morning. 90 tablet 3  . simvastatin (ZOCOR) 20 MG tablet Take 20 mg by mouth.    . traMADol (ULTRAM) 50 MG tablet Take by mouth every 12 (twelve) hours as needed.    . traZODone (DESYREL) 150 MG tablet Take 1 tablet (150 mg total) by mouth at bedtime. 90 tablet 3   No current facility-administered medications for this visit.  Past psychiatric history Patient has a long history of depression and bipolar disorder started in 2003.  She has been seeing in this office since then.  She denies any history of suicidal attempt or any inpatient psychiatric treatment however endorse history of severe depression with passive suicidal thinking and anger issues.  She is seeing therapist in this office regularly.  Allergies: Allergies  Allergen Reactions  . Codeine Itching  . Cymbalta [Duloxetine Hcl] Other (See Comments)    GERD  . Oxycodone Itching and Other (See Comments)    "OUT THERE", climbing the walls  . Baclofen Other (See Comments)    Anxiety got much worse and possibly ppted  manic episode  . Betadine [Povidone Iodine] Itching    Particularly if in a douche   . Flonase [Fluticasone]     Makes eye pressure to increase  . Penicillins Swelling  . Sulfa Antibiotics Swelling  . Aspirin Hives and Rash  . Elavil [Amitriptyline] Other (See Comments)    Caused bowels to empty quickly and couldn't sleep on it.  . Latex Other (See Comments)    If in mouth her mouth swells  . Nsaids Nausea Only and Other (See Comments)    Almost passing out, bad acid reflux   Medical History: Past Medical History  Diagnosis Date  . Fibromyalgia   . Arthritis   . Osteoarthritis   . Bipolar disorder   . GERD (gastroesophageal reflux disease) 12/31/2002  . Sinusitis 12/31/2012   Surgical History: Past Surgical History  Procedure Laterality Date  . Tonsillectomy  04/08/57    some date around then  . Tubal ligation  10/21/1984   Family History family history includes ADD / ADHD in her other and other; Alcohol abuse in her maternal uncle and paternal uncle; Anxiety disorder in her mother and sister; Bipolar disorder in her father and sister; Dementia (age of onset: 54) in her sister; Healthy in her other; Paranoid behavior in her sister; Schizophrenia in her father. There is no history of OCD, Seizures, Sexual abuse, or Physical abuse.  Vitals: BP 134/62 mmHg  Pulse 65  Ht 5\' 4"  (1.626 m)  Wt 140 lb 12.8 oz (63.866 kg)  BMI 24.16 kg/m2 Wt loss 10 pounds from last visit.   review of systems:  Positive for falling lightheadedness fatigue otherwise negative  Mental status examination Patient is casually dressed and fairly groomed.She is calm cooperative and maintained good eye contact. Her speech is soft clear and coherent. Her thought process is logical linear and goal-directed. She denies active or passive suicidal thinking and homicidal thinking. She denies any auditory or visual hallucination. Her attention and concentration is fair. Her memory functions are within normal limits  She described her mood as good and her affect is mood appropriate. There no psychotic symptoms present at this time. She's alert and oriented x3. Her insight judgment and impulse control is okay. He has no evidence of akathisia or movement disorder  Lab Results:  No results found for this or any previous visit (from the past 8736 hour(s)). Assessment Axis I bipolar disorder NOS, anxiety, Vitamin D deficiency by history Axis II deferred Axis III see medical history Axis IV mild to moderate  Plan: I reviewed her medication and psychosocial history.  She'll continue Paxil daily for depression, Neurontin for anxiety and fibromyalgia symptoms trazodone for insomnia and Xanax for anxiety  Recommend to call us back if she is any question or concern if she feels worse to the symptom.  Recommend to  see therapist in this office regularly.  Followup in 3 months.    MEDICATIONS this encounter: Meds ordered this encounter  Medications  . PARoxetine (PAXIL) 40 MG tablet    Sig: Take 1 tablet (40 mg total) by mouth every morning.    Dispense:  90 tablet    Refill:  3  . traZODone (DESYREL) 150 MG tablet    Sig: Take 1 tablet (150 mg total) by mouth at bedtime.    Dispense:  90 tablet    Refill:  3  . gabapentin (NEURONTIN) 300 MG capsule    Sig: Take one twice a day and three at bedtime    Dispense:  450 capsule    Refill:  2  . ALPRAZolam (XANAX) 0.5 MG tablet    Sig: Take 1 tablet (0.5 mg total) by mouth 4 (four) times daily as needed.    Dispense:  360 tablet    Refill:  1    Medical Decision Making Problem Points:  Established problem, stable/improving (1), Review of last therapy session (1) and Review of psycho-social stressors (1) Data Points:  Review or order clinical lab tests (1) Review of medication regiment & side effects (2)  ROSS, DEBORAH, MD

## 2015-09-02 ENCOUNTER — Encounter (HOSPITAL_COMMUNITY): Payer: Self-pay | Admitting: Psychology

## 2015-09-02 NOTE — Progress Notes (Signed)
      Patient:  Katherine Middleton   DOB: 03-29-1951  MR Number: [id number]  Location: [facility] [address] [place] [address] Dept: [phone]  Start: 1 PM End: 2 PM  Provider/Observer:     Edgardo Roys PSYD  Chief Complaint:      Chief Complaint  Patient presents with  . Anxiety  . Stress    Reason For Service:     The patient was referred for psychotherapeutic interventions in conjunction with ongoing psychiatric care. The patient has been diagnosed with bipolar disorder but has also had a number of medical issues including gastrointestinal problems, fibromyalgia, and a spastic colon.  Interventions Strategy:  Cognitive/behavioral psychotherapy  Participation Level:   Active  Participation Quality:  Appropriate      Behavioral Observation:  Well Groomed, Alert, and Appropriate.   Current Psychosocial Factors: The patient reports that she has taken care of her son's step kids some as well as other activity lately.  This has helped but has also stressed her.   Content of Session:   Review current symptoms and continued to work on cognitive/behavioral therapeutic interventions.  Current Status:   The patient reports that her mood had been doing better up to the event with bf.  The patient has been very stressed by this but doing better now.  Patient Progress:   Very good  Target Goals:   Target goals include mood stability, better coping skills, keeping issues and symptoms of depression from becoming too problematic and making situations when she becomes hypomanic or manic he from creating difficulties.  Last Reviewed:   08/25/2015  Goals Addressed Today:    We continued to work on issues related to mood stability and better coping skills around recurrent depression and manic/hypomanic events..  Impression/Diagnosis:   The patient has a long-standing history of difficulties  with anxiety and significant mood disturbance. She has been diagnosed with a formal diagnosis of bipolar disorder.  Diagnosis:    Axis I:  Bipolar 1 disorder  Fibromyalgia      Axis II: No diagnosis

## 2015-09-21 ENCOUNTER — Encounter (HOSPITAL_COMMUNITY): Payer: Self-pay | Admitting: Psychology

## 2015-09-21 ENCOUNTER — Ambulatory Visit (INDEPENDENT_AMBULATORY_CARE_PROVIDER_SITE_OTHER): Payer: Medicare Other | Admitting: Psychology

## 2015-09-21 DIAGNOSIS — F319 Bipolar disorder, unspecified: Secondary | ICD-10-CM

## 2015-09-21 DIAGNOSIS — M797 Fibromyalgia: Secondary | ICD-10-CM

## 2015-09-21 NOTE — Progress Notes (Signed)
      Patient:  Katherine Middleton   DOB: November 30, 1951  MR Number: [id number]  Location: [facility] [address] [place] [address] Dept: [phone]  Start: 1 PM End: 2 PM  Provider/Observer:     Edgardo Roys PSYD  Chief Complaint:      Chief Complaint  Patient presents with  . Agitation  . Anxiety  . Depression  . Stress    Reason For Service:     The patient was referred for psychotherapeutic interventions in conjunction with ongoing psychiatric care. The patient has been diagnosed with bipolar disorder but has also had a number of medical issues including gastrointestinal problems, fibromyalgia, and a spastic colon.  Interventions Strategy:  Cognitive/behavioral psychotherapy  Participation Level:   Active  Participation Quality:  Appropriate      Behavioral Observation:  Well Groomed, Alert, and Appropriate.   Current Psychosocial Factors: The patient reports that she has been taking care of picking up son's step children from school, which is helpful as well as being stressful.   Content of Session:   Review current symptoms and continued to work on cognitive/behavioral therapeutic interventions.  Current Status:   The patient reports that her mood has continued to be stable and overall stress is managamble.  The patient reports that she continues to be frustrated and tired.  Patient Progress:   Very good  Target Goals:   Target goals include mood stability, better coping skills, keeping issues and symptoms of depression from becoming too problematic and making situations when she becomes hypomanic or manic he from creating difficulties.  Last Reviewed:   09/21/2015  Goals Addressed Today:    We continued to work on issues related to mood stability and better coping skills around recurrent depression and manic/hypomanic events..  Impression/Diagnosis:   The patient has a  long-standing history of difficulties with anxiety and significant mood disturbance. She has been diagnosed with a formal diagnosis of bipolar disorder.  Diagnosis:    Axis I:  Bipolar 1 disorder  Fibromyalgia      Axis II: No diagnosis

## 2015-10-25 ENCOUNTER — Ambulatory Visit (HOSPITAL_COMMUNITY): Payer: Self-pay | Admitting: Psychology

## 2015-11-18 ENCOUNTER — Ambulatory Visit (HOSPITAL_COMMUNITY): Payer: Self-pay | Admitting: Psychiatry

## 2015-12-01 ENCOUNTER — Ambulatory Visit (INDEPENDENT_AMBULATORY_CARE_PROVIDER_SITE_OTHER): Payer: Medicare Other | Admitting: Psychology

## 2015-12-01 ENCOUNTER — Encounter (HOSPITAL_COMMUNITY): Payer: Self-pay | Admitting: Psychology

## 2015-12-01 DIAGNOSIS — F319 Bipolar disorder, unspecified: Secondary | ICD-10-CM | POA: Diagnosis not present

## 2015-12-01 DIAGNOSIS — M797 Fibromyalgia: Secondary | ICD-10-CM | POA: Diagnosis not present

## 2015-12-01 NOTE — Addendum Note (Signed)
Addended by: Edgardo Roys on: 12/01/2015 01:55 PM   Modules accepted: Level of Service

## 2015-12-01 NOTE — Progress Notes (Signed)
       Patient:  Katherine Middleton   DOB: Jan 20, 1951  MR Number: [id number]  Location: [facility] [address] [place] [address] Dept: [phone]  Start: 1 PM End: 2 PM  Provider/Observer:     Edgardo Roys PSYD  Chief Complaint:      Chief Complaint  Patient presents with  . Anxiety  . Depression  . Agitation    Reason For Service:     The patient was referred for psychotherapeutic interventions in conjunction with ongoing psychiatric care. The patient has been diagnosed with bipolar disorder but has also had a number of medical issues including gastrointestinal problems, fibromyalgia, and a spastic colon.  Interventions Strategy:  Cognitive/behavioral psychotherapy  Participation Level:   Active  Participation Quality:  Appropriate      Behavioral Observation:  Well Groomed, Alert, and Appropriate.   Current Psychosocial Factors: The patient reports that "brother-In-Law" has gotten out of prison and is causing some stresser with BF and his father.  There are a lot of stressors.   Content of Session:   Review current symptoms and continued to work on cognitive/behavioral therapeutic interventions.  Current Status:   The patient reports that her mood has continued to be stable but she has been handling the stress as well as she could expect.  She has had a significant infection and has not been able to treated so far with doxycycline.    Patient Progress:   Very good  Target Goals:   Target goals include mood stability, better coping skills, keeping issues and symptoms of depression from becoming too problematic and making situations when she becomes hypomanic or manic he from creating difficulties.  Last Reviewed:   12/01/2015  Goals Addressed Today:    We continued to work on issues related to mood stability and better coping skills around recurrent depression and  manic/hypomanic events..  Impression/Diagnosis:   The patient has a long-standing history of difficulties with anxiety and significant mood disturbance. She has been diagnosed with a formal diagnosis of bipolar disorder.  Diagnosis:    Axis I:  Bipolar 1 disorder ([place])  Fibromyalgia      Axis II: No diagnosis

## 2015-12-29 ENCOUNTER — Ambulatory Visit (INDEPENDENT_AMBULATORY_CARE_PROVIDER_SITE_OTHER): Payer: Medicare Other | Admitting: Psychiatry

## 2015-12-29 ENCOUNTER — Encounter (HOSPITAL_COMMUNITY): Payer: Self-pay | Admitting: Psychiatry

## 2015-12-29 VITALS — BP 105/81 | HR 64 | Ht 64.0 in | Wt 141.8 lb

## 2015-12-29 DIAGNOSIS — F419 Anxiety disorder, unspecified: Secondary | ICD-10-CM | POA: Diagnosis not present

## 2015-12-29 DIAGNOSIS — F319 Bipolar disorder, unspecified: Secondary | ICD-10-CM | POA: Diagnosis not present

## 2015-12-29 MED ORDER — PAROXETINE HCL 40 MG PO TABS: 40.0000 mg | ORAL_TABLET | ORAL | Status: DC

## 2015-12-29 MED ORDER — GABAPENTIN 300 MG PO CAPS: ORAL_CAPSULE | ORAL | Status: DC

## 2015-12-29 MED ORDER — TRAZODONE HCL 150 MG PO TABS: 150.0000 mg | ORAL_TABLET | Freq: Every day | ORAL | Status: DC

## 2015-12-29 MED ORDER — ALPRAZOLAM 0.5 MG PO TABS: 0.5000 mg | ORAL_TABLET | Freq: Four times a day (QID) | ORAL | Status: DC | PRN

## 2015-12-29 NOTE — Progress Notes (Signed)
Patient ID: Katherine Middleton, female   DOB: 10/23/1951, 64 y.o.   MRN: [id number] Patient ID: Katherine Middleton, female   DOB: 1951-04-21, 64 y.o.   MRN: [id number] Patient ID: Katherine Middleton, female   DOB: 1951-12-19, 64 y.o.   MRN: [id number] Patient ID: Katherine Middleton, female   DOB: 1951/05/19, 64 y.o.   MRN: [id number] Patient ID: Katherine Middleton, female   DOB: 03/08/1951, 64 y.o.   MRN: [id number] Patient ID: Katherine Middleton, female   DOB: 1951-05-04, 64 y.o.   MRN: [id number] Patient ID: Katherine Middleton, female   DOB: 24-Dec-1951, 64 y.o.   MRN: [id number] Patient ID: Katherine Middleton, female   DOB: 03-03-1951, 64 y.o.   MRN: [id number] Patient ID: Katherine Middleton, female   DOB: [birth year], 64 y.o.   MRN: [id number] [place] [id number] Progress Note Katherine Middleton MRN: [id number] DOB: 1951-03-13 Age: 64 y.o.  Date: 12/29/2015  Chief Complaint  Patient presents with  . Depression  . Manic Behavior  . Anxiety  . Follow-up    History of present illness. Patient is 64 year old Caucasian female who lives with her boyfriend in [place]. Her husband died in 02-Apr-2010. She is on disability for fibromyalgia arthritis and bipolar disorder.  The patient has been seeing a psychiatrist for years. Initially she was diagnosed with depression but eventually she developed some manic symptoms and now is designated as having bipolar disorder. She's often very tired due to her fibromyalgia and has chronic muscle aches and pains. She feels that the medicines she is on now been very helpful. She sleeping well and her mood is stable. She's not using drugs or alcohol denies suicidal ideation. She's never been psychotic but mentions having up "breakdown" about 10 years ago. She's never been hospitalized. She states that her boyfriend is very good to her and "keeps me laughing" which is helped improve her mood dramatically  The patient returns after 4 months. She states that she had an outer ear infection and somehow this affected  her neck and her neck is very stiff. She's had some x-rays done and may be having an MRI. In general however her mood has been good and she is sleeping well at night and her energy is fair as well. She would like to go back up on her Neurontin to 600 mg twice a day and 900 mg at night due to the increased neck pain. I think this is reasonable  Current Outpatient Prescriptions  Medication Sig Dispense Refill  . ALPRAZolam (XANAX) 0.5 MG tablet Take 1 tablet (0.5 mg total) by mouth 4 (four) times daily as needed. 360 tablet 1  . Calcium Citrate-Vitamin D (CALCIUM + D PO) Take by mouth daily.    . cetirizine (ZYRTEC) 10 MG tablet Take 10 mg by mouth daily.    . ergocalciferol (VITAMIN D2) 50000 units capsule Take 50,000 Units by mouth once a week.    . gabapentin (NEURONTIN) 300 MG capsule Taking 2 Tablet in AM, 2 Tablet in Afternoon and 3 Tablets at Night 210 capsule 2  . omeprazole (PRILOSEC) 20 MG capsule Take 20 mg by mouth daily.     Marland Kitchen PARoxetine (PAXIL) 40 MG tablet Take 1 tablet (40 mg total) by mouth every morning. 90 tablet 3  . simvastatin (ZOCOR) 20 MG tablet Take 20 mg by mouth.    . sodium chloride (OCEAN) 0.65 % SOLN nasal spray Place 1 spray into both nostrils as needed for congestion.    Marland Kitchen  TiZANidine HCl (ZANAFLEX PO) Take 4 mg by mouth 2 (two) times daily.    . traMADol (ULTRAM) 50 MG tablet Take by mouth every 12 (twelve) hours as needed. Reported on 12/29/2015    . traZODone (DESYREL) 150 MG tablet Take 1 tablet (150 mg total) by mouth at bedtime. 90 tablet 3   No current facility-administered medications for this visit.    Past psychiatric history Patient has a long history of depression and bipolar disorder started in 2003.  She has been seeing in this office since then.  She denies any history of suicidal attempt or any inpatient psychiatric treatment however endorse history of severe depression with passive suicidal thinking and anger issues.  She is seeing therapist in this  office regularly.  Allergies: Allergies  Allergen Reactions  . Codeine Itching  . Cymbalta [Duloxetine Hcl] Other (See Comments)    GERD  . Oxycodone Itching and Other (See Comments)    "OUT THERE", climbing the walls  . Baclofen Other (See Comments)    Anxiety got much worse and possibly ppted manic episode  . Betadine [Povidone Iodine] Itching    Particularly if in a douche   . Flonase [Fluticasone]     Makes eye pressure to increase  . Penicillins Swelling  . Sulfa Antibiotics Swelling  . Aspirin Hives and Rash  . Elavil [Amitriptyline] Other (See Comments)    Caused bowels to empty quickly and couldn't sleep on it.  . Latex Other (See Comments)    If in mouth her mouth swells  . Nsaids Nausea Only and Other (See Comments)    Almost passing out, bad acid reflux   Medical History: Past Medical History  Diagnosis Date  . Fibromyalgia   . Arthritis   . Osteoarthritis   . Bipolar disorder ([place])   . GERD (gastroesophageal reflux disease) 12/31/2002  . Sinusitis 12/31/2012   Surgical History: Past Surgical History  Procedure Laterality Date  . Tonsillectomy  04/08/57    some date around then  . Tubal ligation  10/21/1984   Family History family history includes ADD / ADHD in her other and other; Alcohol abuse in her maternal uncle and paternal uncle; Anxiety disorder in her mother and sister; Bipolar disorder in her father and sister; Dementia (age of onset: 16) in her sister; Healthy in her other; Paranoid behavior in her sister; Schizophrenia in her father. There is no history of OCD, Seizures, Sexual abuse, or Physical abuse.  Vitals: BP 105/81 mmHg  Pulse 64  Ht 5\' 4"  (1.626 m)  Wt 141 lb 12.8 oz (64.32 kg)  BMI 24.33 kg/m2  SpO2 90% Wt loss 10 pounds from last visit.   review of systems:  Positive for falling lightheadedness fatigue otherwise negative  Mental status examination Patient is casually dressed and fairly groomed.She is calm cooperative and  maintained good eye contact. Her speech is soft clear and coherent. Her thought process is logical linear and goal-directed. She denies active or passive suicidal thinking and homicidal thinking. She denies any auditory or visual hallucination. Her attention and concentration is fair. Her memory functions are within normal limits She described her mood as good and her affect is mood appropriate. There no psychotic symptoms present at this time. She's alert and oriented x3. Her insight judgment and impulse control is okay. He has no evidence of akathisia or movement disorder  Lab Results:  No results found for this or any previous visit (from the past 8736 hour(s)). Assessment Axis I bipolar disorder  NOS, anxiety, Vitamin D deficiency by history Axis II deferred Axis III see medical history Axis IV mild to moderate  Plan: I reviewed her medication and psychosocial history.  She'll continue Paxil daily for depression, Neurontin for anxiety and fibromyalgia symptoms trazodone for insomnia and Xanax for anxiety her Neurontin will be increased back to 600 mg twice a day and 900 mg at bedtime Recommend to call us back if she is any question or concern if she feels worse to the symptom.  Recommend to see therapist in this office regularly.  Followup in 3 months.    MEDICATIONS this encounter: Meds ordered this encounter  Medications  . TiZANidine HCl (ZANAFLEX PO)    Sig: Take 4 mg by mouth 2 (two) times daily.  . sodium chloride (OCEAN) 0.65 % SOLN nasal spray    Sig: Place 1 spray into both nostrils as needed for congestion.  . Calcium Citrate-Vitamin D (CALCIUM + D PO)    Sig: Take by mouth daily.  . ergocalciferol (VITAMIN D2) 50000 units capsule    Sig: Take 50,000 Units by mouth once a week.  Marland Kitchen DISCONTD: gabapentin (NEURONTIN) 300 MG capsule    Sig: Take 300 mg by mouth. Taking 1 Tablet in AM, 1 Tablet in Afternoon and 3 Tablets at Night  . cetirizine (ZYRTEC) 10 MG tablet    Sig: Take 10  mg by mouth daily.  Marland Kitchen PARoxetine (PAXIL) 40 MG tablet    Sig: Take 1 tablet (40 mg total) by mouth every morning.    Dispense:  90 tablet    Refill:  3  . traZODone (DESYREL) 150 MG tablet    Sig: Take 1 tablet (150 mg total) by mouth at bedtime.    Dispense:  90 tablet    Refill:  3  . gabapentin (NEURONTIN) 300 MG capsule    Sig: Taking 2 Tablet in AM, 2 Tablet in Afternoon and 3 Tablets at Night    Dispense:  210 capsule    Refill:  2  . ALPRAZolam (XANAX) 0.5 MG tablet    Sig: Take 1 tablet (0.5 mg total) by mouth 4 (four) times daily as needed.    Dispense:  360 tablet    Refill:  1    Medical Decision Making Problem Points:  Established problem, stable/improving (1), Review of last therapy session (1) and Review of psycho-social stressors (1) Data Points:  Review or order clinical lab tests (1) Review of medication regiment & side effects (2)  ROSS, DEBORAH, MD

## 2016-01-06 ENCOUNTER — Encounter (HOSPITAL_COMMUNITY): Payer: Self-pay | Admitting: Psychology

## 2016-01-06 ENCOUNTER — Ambulatory Visit (INDEPENDENT_AMBULATORY_CARE_PROVIDER_SITE_OTHER): Payer: Medicare Other | Admitting: Psychology

## 2016-01-06 DIAGNOSIS — F419 Anxiety disorder, unspecified: Secondary | ICD-10-CM

## 2016-01-06 DIAGNOSIS — M797 Fibromyalgia: Secondary | ICD-10-CM | POA: Diagnosis not present

## 2016-01-06 DIAGNOSIS — F319 Bipolar disorder, unspecified: Secondary | ICD-10-CM

## 2016-01-06 NOTE — Progress Notes (Signed)
       Patient:  Katherine Middleton   DOB: 1951/07/18  MR Number: [id number]  Location: [facility] [address] [place] [address] Dept: [phone]  Start: 1 PM End: 2 PM  Provider/Observer:     Edgardo Roys PSYD  Chief Complaint:      Chief Complaint  Patient presents with  . Anxiety  . Agitation  . Depression  . Stress    Reason For Service:     The patient was referred for psychotherapeutic interventions in conjunction with ongoing psychiatric care. The patient has been diagnosed with bipolar disorder but has also had a number of medical issues including gastrointestinal problems, fibromyalgia, and a spastic colon.  Interventions Strategy:  Cognitive/behavioral psychotherapy  Participation Level:   Active  Participation Quality:  Appropriate      Behavioral Observation:  Well Groomed, Alert, and Appropriate.   Current Psychosocial Factors: The patient reports that "brother-In-Law" has been a little less demanding.  She had a good xmas for most part.   Content of Session:   Review current symptoms and continued to work on cognitive/behavioral therapeutic interventions.  Current Status:   The patient reports that her mood has continued to be stable but she has been handling the stress as well as she could expect.  Her    Patient Progress:   Very good  Target Goals:   Target goals include mood stability, better coping skills, keeping issues and symptoms of depression from becoming too problematic and making situations when she becomes hypomanic or manic he from creating difficulties.  Last Reviewed:   01/06/2016  Goals Addressed Today:    We continued to work on issues related to mood stability and better coping skills around recurrent depression and manic/hypomanic events..  Impression/Diagnosis:   The patient has a long-standing history of difficulties with anxiety and  significant mood disturbance. She has been diagnosed with a formal diagnosis of bipolar disorder.  Diagnosis:    Axis I:  Bipolar 1 disorder ([place])  Fibromyalgia  Anxiety      Axis II: No diagnosis

## 2016-02-03 ENCOUNTER — Ambulatory Visit (INDEPENDENT_AMBULATORY_CARE_PROVIDER_SITE_OTHER): Payer: Medicare Other | Admitting: Psychology

## 2016-02-03 DIAGNOSIS — F419 Anxiety disorder, unspecified: Secondary | ICD-10-CM | POA: Diagnosis not present

## 2016-02-03 DIAGNOSIS — F319 Bipolar disorder, unspecified: Secondary | ICD-10-CM

## 2016-02-03 DIAGNOSIS — M797 Fibromyalgia: Secondary | ICD-10-CM | POA: Diagnosis not present

## 2016-02-07 ENCOUNTER — Ambulatory Visit (HOSPITAL_COMMUNITY): Payer: Self-pay | Admitting: Psychology

## 2016-02-16 ENCOUNTER — Other Ambulatory Visit (HOSPITAL_COMMUNITY): Payer: Self-pay | Admitting: Psychiatry

## 2016-02-21 ENCOUNTER — Telehealth (HOSPITAL_COMMUNITY): Payer: Self-pay | Admitting: *Deleted

## 2016-02-21 ENCOUNTER — Other Ambulatory Visit (HOSPITAL_COMMUNITY): Payer: Self-pay | Admitting: Psychiatry

## 2016-02-21 MED ORDER — GABAPENTIN 300 MG PO CAPS: ORAL_CAPSULE | ORAL | Status: DC

## 2016-02-21 NOTE — Telephone Encounter (Signed)
Pt pharmacy faxed refill request for pt Neurontin 300 mg. Pt is taking 7 tablets daily. Pt medication was last filled 12-29-15. Pt f/u appt is scheduled for 03-28-16. Pharmacy number is [phone].

## 2016-02-21 NOTE — Telephone Encounter (Signed)
noted 

## 2016-02-21 NOTE — Telephone Encounter (Signed)
sent 

## 2016-02-28 ENCOUNTER — Telehealth (HOSPITAL_COMMUNITY): Payer: Self-pay | Admitting: *Deleted

## 2016-03-01 ENCOUNTER — Ambulatory Visit (INDEPENDENT_AMBULATORY_CARE_PROVIDER_SITE_OTHER): Payer: Medicare Other | Admitting: Psychology

## 2016-03-01 DIAGNOSIS — M797 Fibromyalgia: Secondary | ICD-10-CM | POA: Diagnosis not present

## 2016-03-01 DIAGNOSIS — F319 Bipolar disorder, unspecified: Secondary | ICD-10-CM

## 2016-03-01 DIAGNOSIS — F419 Anxiety disorder, unspecified: Secondary | ICD-10-CM | POA: Diagnosis not present

## 2016-03-02 ENCOUNTER — Ambulatory Visit (HOSPITAL_COMMUNITY): Payer: Self-pay | Admitting: Psychology

## 2016-03-28 ENCOUNTER — Ambulatory Visit (HOSPITAL_COMMUNITY): Payer: Medicare Other | Admitting: Psychiatry

## 2016-03-28 ENCOUNTER — Encounter (HOSPITAL_COMMUNITY): Payer: Self-pay | Admitting: Psychology

## 2016-04-03 ENCOUNTER — Encounter (HOSPITAL_COMMUNITY): Payer: Self-pay | Admitting: Psychology

## 2016-04-03 ENCOUNTER — Ambulatory Visit (INDEPENDENT_AMBULATORY_CARE_PROVIDER_SITE_OTHER): Payer: Medicare Other | Admitting: Psychology

## 2016-04-03 ENCOUNTER — Other Ambulatory Visit (HOSPITAL_COMMUNITY): Payer: Self-pay | Admitting: Psychiatry

## 2016-04-03 ENCOUNTER — Telehealth (HOSPITAL_COMMUNITY): Payer: Self-pay | Admitting: *Deleted

## 2016-04-03 DIAGNOSIS — F419 Anxiety disorder, unspecified: Secondary | ICD-10-CM

## 2016-04-03 DIAGNOSIS — M797 Fibromyalgia: Secondary | ICD-10-CM | POA: Diagnosis not present

## 2016-04-03 DIAGNOSIS — F319 Bipolar disorder, unspecified: Secondary | ICD-10-CM | POA: Diagnosis not present

## 2016-04-03 MED ORDER — PAROXETINE HCL 40 MG PO TABS: 40.0000 mg | ORAL_TABLET | ORAL | Status: DC

## 2016-04-03 NOTE — Telephone Encounter (Signed)
Pt came into office stating she need refills for her Paxil. Pt medication last filled 12-2015 with 3 refills. Pt number is [phone].

## 2016-04-03 NOTE — Progress Notes (Signed)
       Patient:  Katherine Middleton   DOB: 09-07-1951  MR Number: [id number]  Location: [facility] [address] [place] [address] Dept: [phone]  Start: 1 PM End: 2 PM  Provider/Observer:     Edgardo Roys PSYD  Chief Complaint:      Chief Complaint  Patient presents with  . Depression  . Anxiety  . Stress    Reason For Service:     The patient was referred for psychotherapeutic interventions in conjunction with ongoing psychiatric care. The patient has been diagnosed with bipolar disorder but has also had a number of medical issues including gastrointestinal problems, fibromyalgia, and a spastic colon.  Interventions Strategy:  Cognitive/behavioral psychotherapy  Participation Level:   Active  Participation Quality:  Appropriate      Behavioral Observation:  Well Groomed, Alert, and Appropriate.   Current Psychosocial Factors: The patient reports that her boyfriend has been dealing with congestive heart failure and the patient has been in and out of hospital with him.   Content of Session:   Review current symptoms and continued to work on cognitive/behavioral therapeutic interventions.  Current Status:   The patient reports that her mood has continued to be stable but she has been handling the stress as well as she could expect.  Her    Patient Progress:   Very good  Target Goals:   Target goals include mood stability, better coping skills, keeping issues and symptoms of depression from becoming too problematic and making situations when she becomes hypomanic or manic he from creating difficulties.  Last Reviewed:   04/03/2016  Goals Addressed Today:    We continued to work on issues related to mood stability and better coping skills around recurrent depression and manic/hypomanic events..  Impression/Diagnosis:   The patient has a long-standing history of difficulties  with anxiety and significant mood disturbance. She has been diagnosed with a formal diagnosis of bipolar disorder.  Diagnosis:    Axis I:  Bipolar 1 disorder ([place])  Fibromyalgia  Anxiety      Axis II: No diagnosis

## 2016-04-03 NOTE — Telephone Encounter (Signed)
sent 

## 2016-04-03 NOTE — Telephone Encounter (Signed)
noted 

## 2016-04-23 ENCOUNTER — Encounter (HOSPITAL_COMMUNITY): Payer: Self-pay | Admitting: Psychology

## 2016-04-23 NOTE — Progress Notes (Signed)
       Patient:  Katherine Middleton   DOB: 1951/02/15  MR Number: [id number]  Location: [facility] [address] [place] [address] Dept: [phone]  Start: 1 PM End: 2 PM  Provider/Observer:     Edgardo Roys PSYD  Chief Complaint:      Chief Complaint  Patient presents with  . Anxiety  . Stress  . Depression    Reason For Service:     The patient was referred for psychotherapeutic interventions in conjunction with ongoing psychiatric care. The patient has been diagnosed with bipolar disorder but has also had a number of medical issues including gastrointestinal problems, fibromyalgia, and a spastic colon.  Interventions Strategy:  Cognitive/behavioral psychotherapy  Participation Level:   Active  Participation Quality:  Appropriate      Behavioral Observation:  Well Groomed, Alert, and Appropriate.   Current Psychosocial Factors: The patient reports that "brother-In-Law" has been a little less demanding.  She had a good xmas for most part.   Content of Session:   Review current symptoms and continued to work on cognitive/behavioral therapeutic interventions.  Current Status:   The patient reports that her mood has continued to be stable but she has been handling the stress as well as she could expect.  Her    Patient Progress:   Very good  Target Goals:   Target goals include mood stability, better coping skills, keeping issues and symptoms of depression from becoming too problematic and making situations when she becomes hypomanic or manic he from creating difficulties.  Last Reviewed:   02/03/2016  Goals Addressed Today:    We continued to work on issues related to mood stability and better coping skills around recurrent depression and manic/hypomanic events..  Impression/Diagnosis:   The patient has a long-standing history of difficulties with anxiety and significant mood  disturbance. She has been diagnosed with a formal diagnosis of bipolar disorder.  Diagnosis:    Axis I:  Bipolar 1 disorder ([place])  Fibromyalgia  Anxiety      Axis II: No diagnosis

## 2016-05-03 ENCOUNTER — Ambulatory Visit (INDEPENDENT_AMBULATORY_CARE_PROVIDER_SITE_OTHER): Payer: Medicare Other | Admitting: Psychology

## 2016-05-03 ENCOUNTER — Encounter (HOSPITAL_COMMUNITY): Payer: Self-pay | Admitting: Psychology

## 2016-05-03 DIAGNOSIS — F419 Anxiety disorder, unspecified: Secondary | ICD-10-CM | POA: Diagnosis not present

## 2016-05-03 DIAGNOSIS — M797 Fibromyalgia: Secondary | ICD-10-CM | POA: Diagnosis not present

## 2016-05-03 DIAGNOSIS — F319 Bipolar disorder, unspecified: Secondary | ICD-10-CM | POA: Diagnosis not present

## 2016-05-03 NOTE — Progress Notes (Signed)
       Patient:  Katherine Middleton   DOB: 11-18-1951  MR Number: [id number]  Location: [facility] [address] [place] [address] Dept: [phone]  Start: 1 PM End: 2 PM  Provider/Observer:     Edgardo Roys PSYD  Chief Complaint:      Chief Complaint  Patient presents with  . Anxiety  . Depression  . Stress    Reason For Service:     The patient was referred for psychotherapeutic interventions in conjunction with ongoing psychiatric care. The patient has been diagnosed with bipolar disorder but has also had a number of medical issues including gastrointestinal problems, fibromyalgia, and a spastic colon.  Interventions Strategy:  Cognitive/behavioral psychotherapy  Participation Level:   Active  Participation Quality:  Appropriate      Behavioral Observation:  Well Groomed, Alert, and Appropriate.   Current Psychosocial Factors: The patient reports that "brother-In-Law" has been a little less demanding.  She had a good xmas for most part.   Content of Session:   Review current symptoms and continued to work on cognitive/behavioral therapeutic interventions.  Current Status:   The patient reports that her mood has continued to be stable but she has been handling the stress as well as she could expect.  Her    Patient Progress:   Very good  Target Goals:   Target goals include mood stability, better coping skills, keeping issues and symptoms of depression from becoming too problematic and making situations when she becomes hypomanic or manic he from creating difficulties.  Last Reviewed:   05/03/2016  Goals Addressed Today:    We continued to work on issues related to mood stability and better coping skills around recurrent depression and manic/hypomanic events..  Impression/Diagnosis:   The patient has a long-standing history of difficulties with anxiety and significant mood  disturbance. She has been diagnosed with a formal diagnosis of bipolar disorder.  Diagnosis:    Axis I:  Bipolar 1 disorder ([place])  Fibromyalgia  Anxiety      Axis II: No diagnosis

## 2016-05-04 ENCOUNTER — Ambulatory Visit (INDEPENDENT_AMBULATORY_CARE_PROVIDER_SITE_OTHER): Payer: Medicare Other | Admitting: Psychiatry

## 2016-05-04 ENCOUNTER — Encounter (HOSPITAL_COMMUNITY): Payer: Self-pay | Admitting: Psychiatry

## 2016-05-04 VITALS — BP 120/64 | Ht 64.0 in | Wt 138.0 lb

## 2016-05-04 DIAGNOSIS — F319 Bipolar disorder, unspecified: Secondary | ICD-10-CM | POA: Diagnosis not present

## 2016-05-04 MED ORDER — TRAZODONE HCL 150 MG PO TABS
150.0000 mg | ORAL_TABLET | Freq: Every day | ORAL | Status: DC
Start: 2016-05-04 — End: 2016-08-27

## 2016-05-04 MED ORDER — ALPRAZOLAM 0.5 MG PO TABS: 0.5000 mg | ORAL_TABLET | Freq: Four times a day (QID) | ORAL | Status: DC | PRN

## 2016-05-04 MED ORDER — GABAPENTIN 300 MG PO CAPS: ORAL_CAPSULE | ORAL | Status: DC

## 2016-05-04 MED ORDER — PAROXETINE HCL 40 MG PO TABS: 40.0000 mg | ORAL_TABLET | ORAL | Status: DC

## 2016-05-04 NOTE — Progress Notes (Signed)
Katherine Middleton, female   DOB: 1951/09/14, 65 y.o.   MRN: [id number] Katherine Middleton, female   DOB: February 11, 1951, 65 y.o.   MRN: [id number] Katherine Middleton, female   DOB: 1951/01/07, 65 y.o.   MRN: [id number] Katherine Middleton, female   DOB: 06/25/1951, 65 y.o.   MRN: [id number] Katherine Middleton, female   DOB: [birth year], 65 y.o.   MRN: [id number] Katherine Middleton, female   DOB: 1951-03-15, 65 y.o.   MRN: [id number] Katherine Middleton, female   DOB: 1951-09-02, 65 y.o.   MRN: [id number] Katherine Middleton, female   DOB: Jul 13, 1951, 65 y.o.   MRN: [id number] Katherine Middleton, female   DOB: [birth year], 65 y.o.   MRN: [id number] Katherine ID: EVADENE CAPPARELLI, female   DOB: 05/30/1951, 65 y.o.   MRN: [id number] [place] [address] Progress Note Katherine Middleton MRN: [id number] DOB: [birth year] Age: 65 y.o.  Date: 05/04/2016  Chief Complaint  Katherine presents with  . Depression  . Anxiety  . Follow-up    History of present illness. Katherine is 3 year old Caucasian female who lives with her boyfriend in [place]. Her husband died in 04/18/2010. She is on disability for fibromyalgia arthritis and bipolar disorder.  The Katherine has been seeing a psychiatrist for years. Initially she was diagnosed with depression but eventually she developed some manic symptoms and now is designated as having bipolar disorder. She's often very tired due to her fibromyalgia and has chronic muscle aches and pains. She feels that the medicines she is on now been very helpful. She sleeping well and her mood is stable. She's not using drugs or alcohol denies suicidal ideation. She's never been psychotic but mentions having up "breakdown" about 10 years ago. She's never been hospitalized. She states that her boyfriend is very good to her and "keeps me laughing" which is helped improve her mood dramatically  The Katherine returns after 5 months. She has missed  some appointments. Her boyfriend has been very sick with congestive heart failure and has been in and out of the hospital. He still not very mobile. She is trying to take care of him as much as she can. He has not had health insurance and was not taking very good care of his health. She states that she is doing what she can but is also trying to take care of herself as well. Her mood is been stable and she is sleeping well and she denies significant symptoms of depression or anxiety  Current Outpatient Prescriptions  Medication Sig Dispense Refill  . ALPRAZolam (XANAX) 0.5 MG tablet Take 1 tablet (0.5 mg total) by mouth 4 (four) times daily as needed. 360 tablet 1  . Calcium Citrate-Vitamin D (CALCIUM + D PO) Take by mouth daily.    . cetirizine (ZYRTEC) 10 MG tablet Take 10 mg by mouth daily.    . ergocalciferol (VITAMIN D2) 50000 units capsule Take 50,000 Units by mouth once a week.    . gabapentin (NEURONTIN) 300 MG capsule Taking 2 Tablet in AM, 2 Tablet in Afternoon and 3 Tablets at Night 210 capsule 2  . omeprazole (PRILOSEC) 20 MG capsule Take 20 mg by mouth daily.     Marland Kitchen PARoxetine (PAXIL) 40 MG tablet Take 1 tablet (40 mg total) by mouth every morning. 90 tablet 3  . simvastatin (ZOCOR) 20 MG tablet Take 20 mg by mouth.    Marland Kitchen  sodium chloride (OCEAN) 0.65 % SOLN nasal spray Place 1 spray into both nostrils as needed for congestion.    . TiZANidine HCl (ZANAFLEX PO) Take 4 mg by mouth 2 (two) times daily.    . traMADol (ULTRAM) 50 MG tablet Take by mouth every 12 (twelve) hours as needed. Reported on 12/29/2015    . traZODone (DESYREL) 150 MG tablet Take 1 tablet (150 mg total) by mouth at bedtime. 90 tablet 3   No current facility-administered medications for this visit.    Past psychiatric history Katherine has a long history of depression and bipolar disorder started in 2003.  She has been seeing in this office since then.  She denies any history of suicidal attempt or any inpatient  psychiatric treatment however endorse history of severe depression with passive suicidal thinking and anger issues.  She is seeing therapist in this office regularly.  Allergies: Allergies  Allergen Reactions  . Codeine Itching  . Cymbalta [Duloxetine Hcl] Other (See Comments)    GERD  . Oxycodone Itching and Other (See Comments)    "OUT THERE", climbing the walls  . Baclofen Other (See Comments)    Anxiety got much worse and possibly ppted manic episode  . Betadine [Povidone Iodine] Itching    Particularly if in a douche   . Flonase [Fluticasone]     Makes eye pressure to increase  . Penicillins Swelling  . Sulfa Antibiotics Swelling  . Aspirin Hives and Rash  . Elavil [Amitriptyline] Other (See Comments)    Caused bowels to empty quickly and couldn't sleep on it.  . Latex Other (See Comments)    If in mouth her mouth swells  . Nsaids Nausea Only and Other (See Comments)    Almost passing out, bad acid reflux   Medical History: Past Medical History  Diagnosis Date  . Fibromyalgia   . Arthritis   . Osteoarthritis   . Bipolar disorder ([place])   . GERD (gastroesophageal reflux disease) 12/31/2002  . Sinusitis 12/31/2012   Surgical History: Past Surgical History  Procedure Laterality Date  . Tonsillectomy  04/08/57    some date around then  . Tubal ligation  10/21/1984   Family History family history includes ADD / ADHD in her other and other; Alcohol abuse in her maternal uncle and paternal uncle; Anxiety disorder in her mother and sister; Bipolar disorder in her father and sister; Dementia (age of onset: 79) in her sister; Healthy in her other; Paranoid behavior in her sister; Schizophrenia in her father. There is no history of OCD, Seizures, Sexual abuse, or Physical abuse.  Vitals: BP 120/64 mmHg  Ht 5\' 4"  (1.626 m)  Wt 138 lb (62.596 kg)  BMI 23.68 kg/m2 Wt loss 10 pounds from last visit.   review of systems:  Positive for falling lightheadedness fatigue otherwise  negative  Mental status examination Katherine is casually dressed and fairly groomed.She is calm cooperative and maintained good eye contact. Her speech is soft clear and coherent. Her thought process is logical linear and goal-directed. She denies active or passive suicidal thinking and homicidal thinking. She denies any auditory or visual hallucination. Her attention and concentration is fair. Her memory functions are within normal limits She described her mood as good and her affect is mood appropriate. There no psychotic symptoms present at this time. She's alert and oriented x3. Her insight judgment and impulse control is okay. He has no evidence of akathisia or movement disorder  Lab Results:  No results found for this  or any previous visit (from the past 8736 hour(s)). Assessment Axis I bipolar disorder NOS, anxiety, Vitamin D deficiency by history Axis II deferred Axis III see medical history Axis IV mild to moderate  Plan: I reviewed her medication and psychosocial history.  She'll continue Paxil daily for depression, Neurontin for anxiety and fibromyalgia symptoms trazodone for insomnia and Xanax for anxiety her Neurontin will be increased back to 600 mg twice a day and 900 mg at bedtime Recommend to call us back if she is any question or concern if she feels worse to the symptom.  Recommend to see therapist in this office regularly.  Followup in 3 months.    MEDICATIONS this encounter: Meds ordered this encounter  Medications  . traZODone (DESYREL) 150 MG tablet    Sig: Take 1 tablet (150 mg total) by mouth at bedtime.    Dispense:  90 tablet    Refill:  3  . PARoxetine (PAXIL) 40 MG tablet    Sig: Take 1 tablet (40 mg total) by mouth every morning.    Dispense:  90 tablet    Refill:  3  . gabapentin (NEURONTIN) 300 MG capsule    Sig: Taking 2 Tablet in AM, 2 Tablet in Afternoon and 3 Tablets at Night    Dispense:  210 capsule    Refill:  2  . ALPRAZolam (XANAX) 0.5 MG tablet     Sig: Take 1 tablet (0.5 mg total) by mouth 4 (four) times daily as needed.    Dispense:  360 tablet    Refill:  1    Medical Decision Making Problem Points:  Established problem, stable/improving (1), Review of last therapy session (1) and Review of psycho-social stressors (1) Data Points:  Review or order clinical lab tests (1) Review of medication regiment & side effects (2)  Jessa Stinson, MD

## 2016-05-23 DIAGNOSIS — Z9109 Other allergy status, other than to drugs and biological substances: Secondary | ICD-10-CM | POA: Insufficient documentation

## 2016-05-23 DIAGNOSIS — E785 Hyperlipidemia, unspecified: Secondary | ICD-10-CM | POA: Insufficient documentation

## 2016-05-23 DIAGNOSIS — N814 Uterovaginal prolapse, unspecified: Secondary | ICD-10-CM | POA: Insufficient documentation

## 2016-05-23 DIAGNOSIS — M810 Age-related osteoporosis without current pathological fracture: Secondary | ICD-10-CM | POA: Insufficient documentation

## 2016-05-23 DIAGNOSIS — M1990 Unspecified osteoarthritis, unspecified site: Secondary | ICD-10-CM | POA: Insufficient documentation

## 2016-06-06 ENCOUNTER — Encounter (HOSPITAL_COMMUNITY): Payer: Self-pay | Admitting: Psychology

## 2016-06-06 ENCOUNTER — Ambulatory Visit (INDEPENDENT_AMBULATORY_CARE_PROVIDER_SITE_OTHER): Payer: Medicare Other | Admitting: Psychology

## 2016-06-06 DIAGNOSIS — F419 Anxiety disorder, unspecified: Secondary | ICD-10-CM

## 2016-06-06 DIAGNOSIS — F319 Bipolar disorder, unspecified: Secondary | ICD-10-CM

## 2016-06-06 DIAGNOSIS — M797 Fibromyalgia: Secondary | ICD-10-CM | POA: Diagnosis not present

## 2016-06-06 NOTE — Progress Notes (Signed)
       Patient:  Katherine Middleton   DOB: [birth year]  MR Number: [id number]  Location: [facility] [address] [place] [address] Dept: [phone]  Start: 4 PM End: 5 PM  Provider/Observer:     Edgardo Roys PSYD  Chief Complaint:      Chief Complaint  Patient presents with  . Agitation  . Anxiety  . Stress    Reason For Service:     The patient was referred for psychotherapeutic interventions in conjunction with ongoing psychiatric care. The patient has been diagnosed with bipolar disorder but has also had a number of medical issues including gastrointestinal problems, fibromyalgia, and a spastic colon.  Interventions Strategy:  Cognitive/behavioral psychotherapy  Participation Level:   Active  Participation Quality:  Appropriate      Behavioral Observation:  Well Groomed, Alert, and Appropriate.   Current Psychosocial Factors: The patient reports that "brother-In-Law" has been a little less demanding.  She had a good xmas for most part.   Content of Session:   Review current symptoms and continued to work on cognitive/behavioral therapeutic interventions.  Current Status:   The patient reports that her mood has continued to be stable but she has been handling the stress as well as she could expect.  Her    Patient Progress:   Very good  Target Goals:   Target goals include mood stability, better coping skills, keeping issues and symptoms of depression from becoming too problematic and making situations when she becomes hypomanic or manic he from creating difficulties.  Last Reviewed:   03/01/2016  Goals Addressed Today:    We continued to work on issues related to mood stability and better coping skills around recurrent depression and manic/hypomanic events..  Impression/Diagnosis:   The patient has a long-standing history of difficulties with anxiety and significant mood  disturbance. She has been diagnosed with a formal diagnosis of bipolar disorder.  Diagnosis:    Axis I:  Bipolar 1 disorder ([place])  Fibromyalgia  Anxiety      Axis II: No diagnosis

## 2016-06-06 NOTE — Progress Notes (Signed)
       Patient:  Katherine Middleton   DOB: 11/01/1951  MR Number: [id number]  Location: [facility] [address] [place] [address] Dept: [phone]  Start: 1 PM End: 2 PM  Provider/Observer:     Edgardo Roys PSYD  Chief Complaint:      Chief Complaint  Patient presents with  . Anxiety  . Depression    Reason For Service:     The patient was referred for psychotherapeutic interventions in conjunction with ongoing psychiatric care. The patient has been diagnosed with bipolar disorder but has also had a number of medical issues including gastrointestinal problems, fibromyalgia, and a spastic colon.  Interventions Strategy:  Cognitive/behavioral psychotherapy  Participation Level:   Active  Participation Quality:  Appropriate      Behavioral Observation:  Well Groomed, Alert, and Appropriate.   Current Psychosocial Factors: The patient reports she has been doing well and she has now started seeing her son's gf's kids again, which was causing her stress   Content of Session:   Review current symptoms and continued to work on cognitive/behavioral therapeutic interventions.  Current Status:   The patient reports that her mood has continued to be stable but she has been handling the stress as well as she could expect.  Her    Patient Progress:   Very good  Target Goals:   Target goals include mood stability, better coping skills, keeping issues and symptoms of depression from becoming too problematic and making situations when she becomes hypomanic or manic he from creating difficulties.  Last Reviewed:   06/06/2016  Goals Addressed Today:    We continued to work on issues related to mood stability and better coping skills around recurrent depression and manic/hypomanic events..  Impression/Diagnosis:   The patient has a long-standing history of difficulties with anxiety and  significant mood disturbance. She has been diagnosed with a formal diagnosis of bipolar disorder.  Diagnosis:    Axis I:  Bipolar 1 disorder ([place])  Fibromyalgia  Anxiety      Axis II: No diagnosis

## 2016-07-04 ENCOUNTER — Ambulatory Visit (HOSPITAL_COMMUNITY): Payer: Self-pay | Admitting: Psychology

## 2016-07-10 ENCOUNTER — Ambulatory Visit (INDEPENDENT_AMBULATORY_CARE_PROVIDER_SITE_OTHER): Payer: Medicare Other | Admitting: Psychology

## 2016-07-10 DIAGNOSIS — F419 Anxiety disorder, unspecified: Secondary | ICD-10-CM

## 2016-07-10 DIAGNOSIS — M797 Fibromyalgia: Secondary | ICD-10-CM | POA: Diagnosis not present

## 2016-07-10 DIAGNOSIS — F319 Bipolar disorder, unspecified: Secondary | ICD-10-CM | POA: Diagnosis not present

## 2016-07-13 ENCOUNTER — Encounter (HOSPITAL_COMMUNITY): Payer: Self-pay | Admitting: Psychology

## 2016-07-13 NOTE — Progress Notes (Signed)
       Patient:  Katherine Middleton   DOB: [birth year]  MR Number: [id number]  Location: [facility] [address] [place] [address] Dept: [phone]  Start: 1 PM End: 2 PM  Provider/Observer:     Edgardo Roys PSYD  Chief Complaint:      Chief Complaint  Patient presents with  . Anxiety  . Stress    Reason For Service:     The patient was referred for psychotherapeutic interventions in conjunction with ongoing psychiatric care. The patient has been diagnosed with bipolar disorder but has also had a number of medical issues including gastrointestinal problems, fibromyalgia, and a spastic colon.  Interventions Strategy:  Cognitive/behavioral psychotherapy  Participation Level:   Active  Participation Quality:  Appropriate      Behavioral Observation:  Well Groomed, Alert, and Appropriate.   Current Psychosocial Factors: The patient reports she has been doing well and she has now started seeing her son's gf's kids again, which was causing her stress   Content of Session:   Review current symptoms and continued to work on cognitive/behavioral therapeutic interventions.  Current Status:   The patient reports that her mood has continued to be stable but she has been handling the stress as well as she could expect.  Her    Patient Progress:   Very good  Target Goals:   Target goals include mood stability, better coping skills, keeping issues and symptoms of depression from becoming too problematic and making situations when she becomes hypomanic or manic he from creating difficulties.  Last Reviewed:   07/10/2016  Goals Addressed Today:    We continued to work on issues related to mood stability and better coping skills around recurrent depression and manic/hypomanic events..  Impression/Diagnosis:   The patient has a long-standing history of difficulties with anxiety and significant  mood disturbance. She has been diagnosed with a formal diagnosis of bipolar disorder.  Diagnosis:    Axis I:  Bipolar 1 disorder ([place])  Fibromyalgia  Anxiety      Axis II: No diagnosis

## 2016-08-02 ENCOUNTER — Ambulatory Visit (HOSPITAL_COMMUNITY): Payer: Self-pay | Admitting: Psychiatry

## 2016-08-15 ENCOUNTER — Ambulatory Visit (INDEPENDENT_AMBULATORY_CARE_PROVIDER_SITE_OTHER): Payer: Medicare Other | Admitting: Psychology

## 2016-08-15 DIAGNOSIS — M797 Fibromyalgia: Secondary | ICD-10-CM | POA: Diagnosis not present

## 2016-08-15 DIAGNOSIS — F419 Anxiety disorder, unspecified: Secondary | ICD-10-CM

## 2016-08-15 DIAGNOSIS — F319 Bipolar disorder, unspecified: Secondary | ICD-10-CM

## 2016-08-16 ENCOUNTER — Encounter (HOSPITAL_COMMUNITY): Payer: Self-pay | Admitting: Psychology

## 2016-08-16 NOTE — Progress Notes (Signed)
       Patient:  Katherine Middleton   DOB: 04-Oct-1951  MR Number: [id number]  Location: [facility] [address] [place] [address] Dept: [phone]  Start: 1 PM End: 2 PM  Provider/Observer:     Edgardo Roys PSYD  Chief Complaint:      Chief Complaint  Patient presents with  . Anxiety  . Depression  . Stress    Reason For Service:     The patient was referred for psychotherapeutic interventions in conjunction with ongoing psychiatric care. The patient has been diagnosed with bipolar disorder but has also had a number of medical issues including gastrointestinal problems, fibromyalgia, and a spastic colon.  Interventions Strategy:  Cognitive/behavioral psychotherapy  Participation Level:   Active  Participation Quality:  Appropriate      Behavioral Observation:  Well Groomed, Alert, and Appropriate.   Current Psychosocial Factors: The patient reports she has been doing well and she has now started seeing her son's gf's kids again, which was causing her stress   Content of Session:   Review current symptoms and continued to work on cognitive/behavioral therapeutic interventions.  Current Status:   The patient reports that her mood has continued to be stable but she has been handling the stress as well as she could expect.  Her    Patient Progress:   Very good  Target Goals:   Target goals include mood stability, better coping skills, keeping issues and symptoms of depression from becoming too problematic and making situations when she becomes hypomanic or manic he from creating difficulties.  Last Reviewed:   08/15/2016  Goals Addressed Today:    We continued to work on issues related to mood stability and better coping skills around recurrent depression and manic/hypomanic events..  Impression/Diagnosis:   The patient has a long-standing history of difficulties with anxiety  and significant mood disturbance. She has been diagnosed with a formal diagnosis of bipolar disorder.  Diagnosis:    Axis I:  1. Bipolar 1 disorder ([place])    2. Fibromyalgia    3. Anxiety          Axis II: No diagnosis

## 2016-08-27 ENCOUNTER — Ambulatory Visit (INDEPENDENT_AMBULATORY_CARE_PROVIDER_SITE_OTHER): Payer: Medicare Other | Admitting: Psychiatry

## 2016-08-27 ENCOUNTER — Encounter (HOSPITAL_COMMUNITY): Payer: Self-pay | Admitting: Psychiatry

## 2016-08-27 VITALS — BP 115/54 | HR 59 | Ht 64.0 in | Wt 137.8 lb

## 2016-08-27 DIAGNOSIS — F319 Bipolar disorder, unspecified: Secondary | ICD-10-CM

## 2016-08-27 MED ORDER — TRAZODONE HCL 150 MG PO TABS: 150.0000 mg | ORAL_TABLET | Freq: Every day | ORAL | 3 refills | Status: DC

## 2016-08-27 MED ORDER — PAROXETINE HCL 40 MG PO TABS: 40.0000 mg | ORAL_TABLET | ORAL | 3 refills | Status: DC

## 2016-08-27 MED ORDER — ALPRAZOLAM 0.5 MG PO TABS: 0.5000 mg | ORAL_TABLET | Freq: Four times a day (QID) | ORAL | 1 refills | Status: DC | PRN

## 2016-08-27 MED ORDER — GABAPENTIN 300 MG PO CAPS: ORAL_CAPSULE | ORAL | 2 refills | Status: DC

## 2016-08-27 NOTE — Progress Notes (Signed)
Patient ID: BETTINA BRODEN, female   DOB: Aug 26, 1951, 65 y.o.   MRN: [id number] Patient ID: NICKOLETTE MILNOR, female   DOB: March 09, 1951, 65 y.o.   MRN: [id number] Patient ID: JOHONNA RUGEN, female   DOB: [birth year], 65 y.o.   MRN: [id number] Patient ID: MASYN MATTE, female   DOB: 12-01-1951, 65 y.o.   MRN: [id number] Patient ID: SAPHERA BRAWN, female   DOB: [birth year], 65 y.o.   MRN: [id number] Patient ID: ELWANDA MONCION, female   DOB: 02-Nov-1951, 65 y.o.   MRN: [id number] Patient ID: JACYLN PETHTEL, female   DOB: 02-16-1951, 65 y.o.   MRN: [id number] Patient ID: SHELEEN EMICK, female   DOB: 06/21/1951, 65 y.o.   MRN: [id number] Patient ID: OLESYA MELLE, female   DOB: 1951/05/03, 65 y.o.   MRN: [id number] Patient ID: KENDRAH KESSINGER, female   DOB: April 06, 1951, 65 y.o.   MRN: [id number] [place] [id number] Progress Note TISHAUNA NEALON MRN: [id number] DOB: 09-03-1951 Age: 65 y.o.  Date: 08/27/2016  Chief Complaint  Patient presents with  . Anxiety  . Depression  . Follow-up    History of present illness. Patient is 15 year old Caucasian female who lives with her boyfriend in [place]. Her husband died in Apr 10, 2010. She is on disability for fibromyalgia arthritis and bipolar disorder.  The patient has been seeing a psychiatrist for years. Initially she was diagnosed with depression but eventually she developed some manic symptoms and now is designated as having bipolar disorder. She's often very tired due to her fibromyalgia and has chronic muscle aches and pains. She feels that the medicines she is on now been very helpful. She sleeping well and her mood is stable. She's not using drugs or alcohol denies suicidal ideation. She's never been psychotic but mentions having up "breakdown" about 10 years ago. She's never been hospitalized. She states that her boyfriend is very good to her and "keeps me laughing" which is helped improve her mood dramatically  The patient returns after 3 months. For the most  part she's been doing well and her mood is been stable despite her boyfriend's illnesses. She feels that the medications are managing her depression and fibromyalgia symptoms as well as anxiety. She is sleeping well  Current Outpatient Prescriptions  Medication Sig Dispense Refill  . ALPRAZolam (XANAX) 0.5 MG tablet Take 1 tablet (0.5 mg total) by mouth 4 (four) times daily as needed. 360 tablet 1  . Calcium Citrate-Vitamin D (CALCIUM + D PO) Take by mouth daily.    . cetirizine (ZYRTEC) 10 MG tablet Take 10 mg by mouth daily.    Marland Kitchen gabapentin (NEURONTIN) 300 MG capsule Taking 2 Tablet in AM, 2 Tablet in Afternoon and 3 Tablets at Night 210 capsule 2  . omeprazole (PRILOSEC) 20 MG capsule Take 20 mg by mouth daily.     Marland Kitchen PARoxetine (PAXIL) 40 MG tablet Take 1 tablet (40 mg total) by mouth every morning. 90 tablet 3  . simvastatin (ZOCOR) 20 MG tablet Take 20 mg by mouth.    . sodium chloride (OCEAN) 0.65 % SOLN nasal spray Place 1 spray into both nostrils as needed for congestion.    . TiZANidine HCl (ZANAFLEX PO) Take 4 mg by mouth 2 (two) times daily.    . traMADol (ULTRAM) 50 MG tablet Take by mouth every 12 (twelve) hours as needed. Reported on 12/29/2015    . traZODone (DESYREL) 150 MG tablet Take 1 tablet (150 mg total) by mouth  at bedtime. 90 tablet 3   No current facility-administered medications for this visit.     Past psychiatric history Patient has a long history of depression and bipolar disorder started in 2003.  She has been seeing in this office since then.  She denies any history of suicidal attempt or any inpatient psychiatric treatment however endorse history of severe depression with passive suicidal thinking and anger issues.  She is seeing therapist in this office regularly.  Allergies: Allergies  Allergen Reactions  . Codeine Itching  . Cymbalta [Duloxetine Hcl] Other (See Comments)    GERD  . Oxycodone Itching and Other (See Comments)    "OUT THERE", climbing the  walls  . Baclofen Other (See Comments)    Anxiety got much worse and possibly ppted manic episode  . Betadine [Povidone Iodine] Itching    Particularly if in a douche   . Flonase [Fluticasone]     Makes eye pressure to increase  . Penicillins Swelling  . Sulfa Antibiotics Swelling  . Aspirin Hives and Rash  . Elavil [Amitriptyline] Other (See Comments)    Caused bowels to empty quickly and couldn't sleep on it.  . Latex Other (See Comments)    If in mouth her mouth swells  . Nsaids Nausea Only and Other (See Comments)    Almost passing out, bad acid reflux   Medical History: Past Medical History:  Diagnosis Date  . Arthritis   . Bipolar disorder ([place])   . Fibromyalgia   . GERD (gastroesophageal reflux disease) 12/31/2002  . Osteoarthritis   . Sinusitis 12/31/2012   Surgical History: Past Surgical History:  Procedure Laterality Date  . TONSILLECTOMY  04/08/57   some date around then  . TUBAL LIGATION  10/21/1984   Family History family history includes ADD / ADHD in her other and other; Alcohol abuse in her maternal uncle and paternal uncle; Anxiety disorder in her mother and sister; Bipolar disorder in her father and sister; Dementia (age of onset: 21) in her sister; Healthy in her other; Paranoid behavior in her sister; Schizophrenia in her father.  Vitals: BP (!) 115/54 (BP Location: Right Arm, Patient Position: Sitting, Cuff Size: Normal)   Pulse (!) 59   Ht 5\' 4"  (1.626 m)   Wt 137 lb 12.8 oz (62.5 kg)   BMI 23.65 kg/m  Wt loss 10 pounds from last visit.   review of systems:  Positive for falling lightheadedness fatigue otherwise negative  Mental status examination Patient is casually dressed and fairly groomed.She is calm cooperative and maintained good eye contact. Her speech is soft clear and coherent. Her thought process is logical linear and goal-directed. She denies active or passive suicidal thinking and homicidal thinking. She denies any auditory or visual  hallucination. Her attention and concentration is fair. Her memory functions are within normal limits She described her mood as good and her affect is mood appropriate. There no psychotic symptoms present at this time. She's alert and oriented x3. Her insight judgment and impulse control is okay. He has no evidence of akathisia or movement disorder  Lab Results:  No results found for this or any previous visit (from the past 8736 hour(s)). Assessment Axis I bipolar disorder NOS, anxiety, Vitamin D deficiency by history Axis II deferred Axis III see medical history Axis IV mild to moderate  Plan: I reviewed her medication and psychosocial history.  She'll continue Paxil daily for depression, Neurontin for anxiety and fibromyalgia symptoms trazodone for insomnia and Xanax for anxiety  Recommend to call us back if she is any question or concern if she feels worse to the symptom.  Recommend to see therapist in this office regularly.  Followup in 3 months.    MEDICATIONS this encounter: Meds ordered this encounter  Medications  . PARoxetine (PAXIL) 40 MG tablet    Sig: Take 1 tablet (40 mg total) by mouth every morning.    Dispense:  90 tablet    Refill:  3  . traZODone (DESYREL) 150 MG tablet    Sig: Take 1 tablet (150 mg total) by mouth at bedtime.    Dispense:  90 tablet    Refill:  3  . gabapentin (NEURONTIN) 300 MG capsule    Sig: Taking 2 Tablet in AM, 2 Tablet in Afternoon and 3 Tablets at Night    Dispense:  210 capsule    Refill:  2  . ALPRAZolam (XANAX) 0.5 MG tablet    Sig: Take 1 tablet (0.5 mg total) by mouth 4 (four) times daily as needed.    Dispense:  360 tablet    Refill:  1    Medical Decision Making Problem Points:  Established problem, stable/improving (1), Review of last therapy session (1) and Review of psycho-social stressors (1) Data Points:  Review or order clinical lab tests (1) Review of medication regiment & side effects (2)  ROSS, DEBORAH, MD

## 2016-09-12 ENCOUNTER — Encounter (HOSPITAL_COMMUNITY): Payer: Self-pay | Admitting: Psychology

## 2016-09-12 ENCOUNTER — Ambulatory Visit (INDEPENDENT_AMBULATORY_CARE_PROVIDER_SITE_OTHER): Payer: Medicare Other | Admitting: Psychology

## 2016-09-12 DIAGNOSIS — F419 Anxiety disorder, unspecified: Secondary | ICD-10-CM

## 2016-09-12 DIAGNOSIS — M797 Fibromyalgia: Secondary | ICD-10-CM

## 2016-09-12 DIAGNOSIS — F319 Bipolar disorder, unspecified: Secondary | ICD-10-CM | POA: Diagnosis not present

## 2016-09-12 NOTE — Progress Notes (Signed)
       Patient:  Katherine Middleton   DOB: 10-Aug-1951  MR Number: [id number]  Location: [facility] [address] [address] Dept: [phone]  Start: 1 PM End: 2 PM  Provider/Observer:     Edgardo Roys PSYD  Chief Complaint:      Chief Complaint  Patient presents with  . Anxiety  . Depression  . Stress    Reason For Service:     The patient was referred for psychotherapeutic interventions in conjunction with ongoing psychiatric care. The patient has been diagnosed with bipolar disorder but has also had a number of medical issues including gastrointestinal problems, fibromyalgia, and a spastic colon.  Interventions Strategy:  Cognitive/behavioral psychotherapy  Participation Level:   Active  Participation Quality:  Appropriate      Behavioral Observation:  Well Groomed, Alert, and Appropriate.   Current Psychosocial Factors: The patient reports That her sister has been doing very poorly medically and psychologically lately. The patient's sister been having problems with falling and needing more care and needed to move out of the assisted living facility she had been in. The family attempted to take her sister to [place] to be with the patient's nephew but she continued to have more medical issues analysis in a rehabilitation facility. There is a lot of stress for the patient not knowing what can happen with her sister and whether or sister we put in a nursing home in [place] but the patient never been able to see her again or whether the sister will come back to [place] to be in a nursing home there. The patient reports that her boyfriend is also been dealing with his own medical issues for some of those things groomed. The patient has a vulnerability to try to feel like she needs to be responsible to help everyone in her family.  Content of Session:   Review current  symptoms and continued to work on cognitive/behavioral therapeutic interventions.  Current Status:   The patient reports that she has had much more stress over the past month or so and has at times felt overwhelmed. We reinforced the need to actively apply the coping skills and strategies we have developed to keep her bipolar affective disorder from triggering either manic or depressive event.   Patient Progress:   Very good  Target Goals:   Target goals include mood stability, better coping skills, keeping issues and symptoms of depression from becoming too problematic and making situations when she becomes hypomanic or manic he from creating difficulties.  Last Reviewed:   09/12/2016  Goals Addressed Today:    We continued to work on issues related to mood stability and better coping skills around recurrent depression and manic/hypomanic events..  Impression/Diagnosis:   The patient has a long-standing history of difficulties with anxiety and significant mood disturbance. She has been diagnosed with a formal diagnosis of bipolar disorder.  Diagnosis:    Axis I:  1. Bipolar 1 disorder ([place])    2. Fibromyalgia    3. Anxiety          Axis II: No diagnosis

## 2016-10-10 ENCOUNTER — Ambulatory Visit (HOSPITAL_COMMUNITY): Payer: Self-pay | Admitting: Psychology

## 2016-10-15 ENCOUNTER — Other Ambulatory Visit (HOSPITAL_COMMUNITY): Payer: Self-pay | Admitting: Psychiatry

## 2016-10-30 ENCOUNTER — Ambulatory Visit (INDEPENDENT_AMBULATORY_CARE_PROVIDER_SITE_OTHER): Payer: Medicare Other | Admitting: Psychology

## 2016-10-30 DIAGNOSIS — F319 Bipolar disorder, unspecified: Secondary | ICD-10-CM

## 2016-10-30 DIAGNOSIS — M797 Fibromyalgia: Secondary | ICD-10-CM | POA: Diagnosis not present

## 2016-10-30 NOTE — Progress Notes (Signed)
       Patient:  Katherine Middleton   DOB: 1951-02-24  MR Number: [id number]  Location: [facility] [address] [place] [address] Dept: [phone]  Start: 1 PM End: 2 PM  Provider/Observer:     Edgardo Roys PSYD  Chief Complaint:      Chief Complaint  Patient presents with  . Depression  . Anxiety    Reason For Service:     The patient was referred for psychotherapeutic interventions in conjunction with ongoing psychiatric care. The patient has been diagnosed with bipolar disorder but has also had a number of medical issues including gastrointestinal problems, fibromyalgia, and a spastic colon.  Interventions Strategy:  Cognitive/behavioral psychotherapy  Participation Level:   Active  Participation Quality:  Appropriate      Behavioral Observation:  Well Groomed, Alert, and Appropriate.   Current Psychosocial Factors: The patient reports she fell and broke back in shower and that has really left her in pain and struggling   Content of Session:   Review current symptoms and continued to work on cognitive/behavioral therapeutic interventions.  Current Status:   The patient reports that she has had much more stress over the past month or so and has at times felt overwhelmed. We reinforced the need to actively apply the coping skills and strategies we have developed to keep her bipolar affective disorder from triggering either manic or depressive event.   Patient Progress:   Very good  Target Goals:   Target goals include mood stability, better coping skills, keeping issues and symptoms of depression from becoming too problematic and making situations when she becomes hypomanic or manic he from creating difficulties.  Last Reviewed:   10/30/2016  Goals Addressed Today:    We continued to work on issues related to mood stability and better coping skills around recurrent depression  and manic/hypomanic events..  Impression/Diagnosis:   The patient has a long-standing history of difficulties with anxiety and significant mood disturbance. She has been diagnosed with a formal diagnosis of bipolar disorder.  Diagnosis:    Axis I:  1. Bipolar 1 disorder ([place])    2. Fibromyalgia          Axis II: No diagnosis

## 2016-11-13 ENCOUNTER — Other Ambulatory Visit: Payer: Self-pay | Admitting: Family Medicine

## 2016-11-13 DIAGNOSIS — Z1231 Encounter for screening mammogram for malignant neoplasm of breast: Secondary | ICD-10-CM

## 2016-11-21 ENCOUNTER — Encounter (HOSPITAL_COMMUNITY): Payer: Self-pay | Admitting: Psychiatry

## 2016-11-21 ENCOUNTER — Ambulatory Visit (INDEPENDENT_AMBULATORY_CARE_PROVIDER_SITE_OTHER): Payer: Medicare Other | Admitting: Psychiatry

## 2016-11-21 VITALS — BP 130/53 | HR 60 | Ht 64.0 in | Wt 140.6 lb

## 2016-11-21 DIAGNOSIS — F419 Anxiety disorder, unspecified: Secondary | ICD-10-CM

## 2016-11-21 DIAGNOSIS — Z91040 Latex allergy status: Secondary | ICD-10-CM

## 2016-11-21 DIAGNOSIS — Z882 Allergy status to sulfonamides status: Secondary | ICD-10-CM

## 2016-11-21 DIAGNOSIS — Z9889 Other specified postprocedural states: Secondary | ICD-10-CM

## 2016-11-21 DIAGNOSIS — M797 Fibromyalgia: Secondary | ICD-10-CM

## 2016-11-21 DIAGNOSIS — F319 Bipolar disorder, unspecified: Secondary | ICD-10-CM

## 2016-11-21 DIAGNOSIS — Z79899 Other long term (current) drug therapy: Secondary | ICD-10-CM

## 2016-11-21 DIAGNOSIS — Z880 Allergy status to penicillin: Secondary | ICD-10-CM

## 2016-11-21 DIAGNOSIS — Z888 Allergy status to other drugs, medicaments and biological substances status: Secondary | ICD-10-CM

## 2016-11-21 MED ORDER — GABAPENTIN 300 MG PO CAPS: ORAL_CAPSULE | ORAL | 2 refills | Status: DC

## 2016-11-21 MED ORDER — TRAZODONE HCL 150 MG PO TABS: 150.0000 mg | ORAL_TABLET | Freq: Every day | ORAL | 3 refills | Status: DC

## 2016-11-21 MED ORDER — ALPRAZOLAM 0.5 MG PO TABS: 0.5000 mg | ORAL_TABLET | Freq: Four times a day (QID) | ORAL | 1 refills | Status: DC | PRN

## 2016-11-21 MED ORDER — PAROXETINE HCL 40 MG PO TABS: 40.0000 mg | ORAL_TABLET | ORAL | 3 refills | Status: DC

## 2016-11-21 NOTE — Progress Notes (Signed)
Patient ID: WYOMIA MALLOCH, female   DOB: 1951-11-22, 65 y.o.   MRN: [id number] Patient ID: BRODY HURRY, female   DOB: 1951-08-30, 65 y.o.   MRN: [id number] Patient ID: JEQUETTA ABERG, female   DOB: April 21, 1951, 65 y.o.   MRN: [id number] Patient ID: CAELEIGH CRAPSER, female   DOB: 1951-12-10, 65 y.o.   MRN: [id number] Patient ID: JAILYNN DOH, female   DOB: 1951/07/16, 65 y.o.   MRN: [id number] Patient ID: STEFANA VICENCIO, female   DOB: [birth year], 65 y.o.   MRN: [id number] Patient ID: LOVELY GAVILANES, female   DOB: 06/05/1951, 65 y.o.   MRN: [id number] Patient ID: ANGELEENA MERTINS, female   DOB: 17-Jul-1951, 65 y.o.   MRN: [id number] Patient ID: ROCKY VOGELSONG, female   DOB: November 23, 1951, 65 y.o.   MRN: [id number] Patient ID: JESSLYN ROHLOFF, female   DOB: 24-Oct-1951, 65 y.o.   MRN: [id number] [place] [id number] Progress Note JAVAEH THOMPSEN MRN: [id number] DOB: 1951/02/08 Age: 65 y.o.  Date: 11/21/2016  Chief Complaint  Patient presents with  . Follow-up  . Fatigue  . Depression  . Anxiety    History of present illness. Patient is 28 year old Caucasian female who lives with her boyfriend in [place]. Her husband died in 04-10-10. She is on disability for fibromyalgia arthritis and bipolar disorder.  The patient has been seeing a psychiatrist for years. Initially she was diagnosed with depression but eventually she developed some manic symptoms and now is designated as having bipolar disorder. She's often very tired due to her fibromyalgia and has chronic muscle aches and pains. She feels that the medicines she is on now been very helpful. She sleeping well and her mood is stable. She's not using drugs or alcohol denies suicidal ideation. She's never been psychotic but mentions having  "breakdown" about 10 years ago. She's never been hospitalized. She states that her boyfriend is very good to her and "keeps me laughing" which is helped improve her mood dramatically  The patient returns after 3 months.  She fell in her bathtub in October and suffered a T1 compression fracture. She is moving very slowly. She is taking ties tizanidine but really no other medication. Boyfriend also has significant back problems and they're struggling a long. She states however that her mood is been stable she is sleeping well and she denies any symptoms of mania or severe depression  Current Outpatient Prescriptions  Medication Sig Dispense Refill  . acetaminophen (TYLENOL 8 HOUR) 650 MG CR tablet Take 650 mg by mouth at bedtime.    . ALPRAZolam (XANAX) 0.5 MG tablet Take 1 tablet (0.5 mg total) by mouth 4 (four) times daily as needed. 360 tablet 1  . Calcium Citrate-Vitamin D (CALCIUM + D PO) Take by mouth daily.    . cetirizine (ZYRTEC) 10 MG tablet Take 10 mg by mouth daily.    Marland Kitchen gabapentin (NEURONTIN) 300 MG capsule Taking 2 Tablet in AM, 2 Tablet in Afternoon and 3 Tablets at Night 210 capsule 2  . omeprazole (PRILOSEC) 20 MG capsule Take 20 mg by mouth daily.     Marland Kitchen PARoxetine (PAXIL) 40 MG tablet Take 1 tablet (40 mg total) by mouth every morning. 90 tablet 3  . simvastatin (ZOCOR) 20 MG tablet Take 20 mg by mouth.    . TiZANidine HCl (ZANAFLEX PO) Take 4 mg by mouth 2 (two) times daily.    . traMADol (ULTRAM) 50 MG tablet Take by mouth every 12 (  twelve) hours as needed. Reported on 12/29/2015    . traZODone (DESYREL) 150 MG tablet Take 1 tablet (150 mg total) by mouth at bedtime. 90 tablet 3  . sodium chloride (OCEAN) 0.65 % SOLN nasal spray Place 1 spray into both nostrils as needed for congestion.     No current facility-administered medications for this visit.     Past psychiatric history Patient has a long history of depression and bipolar disorder started in 2003.  She has been seeing in this office since then.  She denies any history of suicidal attempt or any inpatient psychiatric treatment however endorse history of severe depression with passive suicidal thinking and anger issues.  She is seeing  therapist in this office regularly.  Allergies: Allergies  Allergen Reactions  . Codeine Itching  . Cymbalta [Duloxetine Hcl] Other (See Comments)    GERD  . Oxycodone Itching and Other (See Comments)    "OUT THERE", climbing the walls  . Baclofen Other (See Comments)    Anxiety got much worse and possibly ppted manic episode  . Betadine [Povidone Iodine] Itching    Particularly if in a douche   . Flonase [Fluticasone]     Makes eye pressure to increase  . Penicillins Swelling  . Sulfa Antibiotics Swelling  . Aspirin Hives and Rash  . Elavil [Amitriptyline] Other (See Comments)    Caused bowels to empty quickly and couldn't sleep on it.  . Latex Other (See Comments)    If in mouth her mouth swells  . Nsaids Nausea Only and Other (See Comments)    Almost passing out, bad acid reflux   Medical History: Past Medical History:  Diagnosis Date  . Arthritis   . Bipolar disorder ([place])   . Fibromyalgia   . GERD (gastroesophageal reflux disease) 12/31/2002  . Osteoarthritis   . Sinusitis 12/31/2012   Surgical History: Past Surgical History:  Procedure Laterality Date  . TONSILLECTOMY  04/08/57   some date around then  . TUBAL LIGATION  10/21/1984   Family History family history includes ADD / ADHD in her other and other; Alcohol abuse in her maternal uncle and paternal uncle; Anxiety disorder in her mother and sister; Bipolar disorder in her father and sister; Dementia (age of onset: 63) in her sister; Healthy in her other; Paranoid behavior in her sister; Schizophrenia in her father.  Vitals: BP (!) 130/53 (BP Location: Right Arm, Patient Position: Sitting, Cuff Size: Normal)   Pulse 60   Ht 5\' 4"  (1.626 m)   Wt 140 lb 9.6 oz (63.8 kg)   BMI 24.13 kg/m  Wt loss 10 pounds from last visit.   review of systems:  Positive for falling lightheadedness fatigue otherwise negative  Mental status examination Patient is casually dressed and fairly groomed.He is walking very slowly  and stiffly She is calm cooperative and maintained good eye contact. Her speech is soft clear and coherent. Her thought process is logical linear and goal-directed. She denies active or passive suicidal thinking and homicidal thinking. She denies any auditory or visual hallucination. Her attention and concentration is fair. Her memory functions are within normal limits She described her mood as good and her affect is mood appropriate. There no psychotic symptoms present at this time. She's alert and oriented x3. Her insight judgment and impulse control is okay. He has no evidence of akathisia or movement disorder  Lab Results:  No results found for this or any previous visit (from the past 8736 hour(s)). Assessment Axis I  bipolar disorder NOS, anxiety, Vitamin D deficiency by history Axis II deferred Axis III see medical history Axis IV mild to moderate  Plan: I reviewed her medication and psychosocial history.  She'll continue Paxil daily for depression, Neurontin for anxiety and fibromyalgia symptoms trazodone for insomnia and Xanax for anxiety  Recommend to call us back if she is any question or concern if she feels worse to the symptom.  Recommend to see therapist in this office regularly.  Followup in 3 months.    MEDICATIONS this encounter: Meds ordered this encounter  Medications  . acetaminophen (TYLENOL 8 HOUR) 650 MG CR tablet    Sig: Take 650 mg by mouth at bedtime.  . traZODone (DESYREL) 150 MG tablet    Sig: Take 1 tablet (150 mg total) by mouth at bedtime.    Dispense:  90 tablet    Refill:  3  . PARoxetine (PAXIL) 40 MG tablet    Sig: Take 1 tablet (40 mg total) by mouth every morning.    Dispense:  90 tablet    Refill:  3  . gabapentin (NEURONTIN) 300 MG capsule    Sig: Taking 2 Tablet in AM, 2 Tablet in Afternoon and 3 Tablets at Night    Dispense:  210 capsule    Refill:  2  . ALPRAZolam (XANAX) 0.5 MG tablet    Sig: Take 1 tablet (0.5 mg total) by mouth 4 (four)  times daily as needed.    Dispense:  360 tablet    Refill:  1    Medical Decision Making Problem Points:  Established problem, stable/improving (1), Review of last therapy session (1) and Review of psycho-social stressors (1) Data Points:  Review or order clinical lab tests (1) Review of medication regiment & side effects (2)  Vineeth Fell, MD

## 2016-11-27 ENCOUNTER — Ambulatory Visit (HOSPITAL_COMMUNITY): Payer: Self-pay | Admitting: Psychiatry

## 2016-11-29 ENCOUNTER — Ambulatory Visit (INDEPENDENT_AMBULATORY_CARE_PROVIDER_SITE_OTHER): Payer: Medicare Other | Admitting: Psychology

## 2016-12-07 ENCOUNTER — Ambulatory Visit: Payer: Self-pay

## 2017-01-02 ENCOUNTER — Ambulatory Visit (INDEPENDENT_AMBULATORY_CARE_PROVIDER_SITE_OTHER): Payer: Medicare Other | Admitting: Psychology

## 2017-01-02 DIAGNOSIS — F319 Bipolar disorder, unspecified: Secondary | ICD-10-CM | POA: Diagnosis not present

## 2017-01-02 DIAGNOSIS — M797 Fibromyalgia: Secondary | ICD-10-CM | POA: Diagnosis not present

## 2017-01-03 NOTE — Progress Notes (Signed)
       Patient:  Katherine Middleton   DOB: 1951/10/26  MR Number: [id number]  Location: [facility] [address] [place] [address] Dept: [phone]  Start: 1 PM End: 2 PM  Provider/Observer:     Edgardo Roys PSYD  Chief Complaint:      Chief Complaint  Patient presents with  . Anxiety  . Depression  . Stress  . Agitation    Reason For Service:     The patient was referred for psychotherapeutic interventions in conjunction with ongoing psychiatric care. The patient has been diagnosed with bipolar disorder but has also had a number of medical issues including gastrointestinal problems, fibromyalgia, and a spastic colon.  Interventions Strategy:  Cognitive/behavioral psychotherapy  Participation Level:   Active  Participation Quality:  Appropriate      Behavioral Observation:  Well Groomed, Alert, and Appropriate.   Current Psychosocial Factors: The patient reports she fell and broke back in shower and that has really left her in pain and struggling   Content of Session:   Review current symptoms and continued to work on cognitive/behavioral therapeutic interventions.  Current Status:   The patient reports that she has had much more stress over the past month or so and has at times felt overwhelmed. We reinforced the need to actively apply the coping skills and strategies we have developed to keep her bipolar affective disorder from triggering either manic or depressive event.   Patient Progress:   Very good  Target Goals:   Target goals include mood stability, better coping skills, keeping issues and symptoms of depression from becoming too problematic and making situations when she becomes hypomanic or manic he from creating difficulties.  Last Reviewed:   /02/2017  Goals Addressed Today:    We continued to work on issues related to mood stability and better coping skills  around recurrent depression and manic/hypomanic events..  Impression/Diagnosis:   The patient has a long-standing history of difficulties with anxiety and significant mood disturbance. She has been diagnosed with a formal diagnosis of bipolar disorder.  Diagnosis:    Axis I:  1. Bipolar 1 disorder ([place])    2. Fibromyalgia          Axis II: No diagnosis

## 2017-01-04 ENCOUNTER — Ambulatory Visit
Admission: RE | Admit: 2017-01-04 | Discharge: 2017-01-04 | Disposition: A | Discharge status: Home / Self Care | Payer: Medicare Other | Source: Ambulatory Visit | Attending: Family Medicine | Admitting: Family Medicine

## 2017-01-04 DIAGNOSIS — Z1231 Encounter for screening mammogram for malignant neoplasm of breast: Secondary | ICD-10-CM

## 2017-02-13 ENCOUNTER — Telehealth (HOSPITAL_COMMUNITY): Payer: Self-pay | Admitting: *Deleted

## 2017-02-13 NOTE — Telephone Encounter (Signed)
left voice message, provider out of office 02/21/17. 

## 2017-02-21 ENCOUNTER — Ambulatory Visit (HOSPITAL_COMMUNITY): Payer: Self-pay | Admitting: Psychiatry

## 2017-02-27 ENCOUNTER — Telehealth (HOSPITAL_COMMUNITY): Payer: Self-pay | Admitting: *Deleted

## 2017-02-27 NOTE — Telephone Encounter (Signed)
returned phone call regarding an appointment. 

## 2017-03-07 ENCOUNTER — Encounter (HOSPITAL_COMMUNITY): Payer: Self-pay | Admitting: Psychiatry

## 2017-03-07 ENCOUNTER — Ambulatory Visit (INDEPENDENT_AMBULATORY_CARE_PROVIDER_SITE_OTHER): Payer: Medicare Other | Admitting: Psychiatry

## 2017-03-07 VITALS — BP 116/57 | HR 65 | Ht 64.0 in | Wt 144.0 lb

## 2017-03-07 DIAGNOSIS — Z888 Allergy status to other drugs, medicaments and biological substances status: Secondary | ICD-10-CM | POA: Diagnosis not present

## 2017-03-07 DIAGNOSIS — Z91040 Latex allergy status: Secondary | ICD-10-CM | POA: Diagnosis not present

## 2017-03-07 DIAGNOSIS — Z880 Allergy status to penicillin: Secondary | ICD-10-CM

## 2017-03-07 DIAGNOSIS — Z79899 Other long term (current) drug therapy: Secondary | ICD-10-CM | POA: Diagnosis not present

## 2017-03-07 DIAGNOSIS — Z882 Allergy status to sulfonamides status: Secondary | ICD-10-CM | POA: Diagnosis not present

## 2017-03-07 DIAGNOSIS — E559 Vitamin D deficiency, unspecified: Secondary | ICD-10-CM

## 2017-03-07 DIAGNOSIS — F319 Bipolar disorder, unspecified: Secondary | ICD-10-CM | POA: Diagnosis not present

## 2017-03-07 DIAGNOSIS — F419 Anxiety disorder, unspecified: Secondary | ICD-10-CM

## 2017-03-07 MED ORDER — PAROXETINE HCL 40 MG PO TABS: 40.0000 mg | ORAL_TABLET | ORAL | 3 refills | Status: DC

## 2017-03-07 MED ORDER — TRAZODONE HCL 150 MG PO TABS: 150.0000 mg | ORAL_TABLET | Freq: Every day | ORAL | 3 refills | Status: DC

## 2017-03-07 MED ORDER — ALPRAZOLAM 0.5 MG PO TABS: 0.5000 mg | ORAL_TABLET | Freq: Four times a day (QID) | ORAL | 1 refills | Status: DC | PRN

## 2017-03-07 MED ORDER — GABAPENTIN 300 MG PO CAPS: ORAL_CAPSULE | ORAL | 2 refills | Status: DC

## 2017-03-07 NOTE — Progress Notes (Signed)
Patient ID: [name], female   DOB: 12-Nov-1951, 66 y.o.   MRN: [id number] Patient ID: [name], female   DOB: 1951-10-04, 66 y.o.   MRN: [id number] Patient ID: [name], female   DOB: [birth year], 66 y.o.   MRN: [id number] Patient ID: [name], female   DOB: 10/11/1951, 66 y.o.   MRN: [id number] Patient ID: [name], female   DOB: 03-03-1951, 66 y.o.   MRN: [id number] Patient ID: [name], female   DOB: [birth year], 66 y.o.   MRN: [id number] Patient ID: [name], female   DOB: 03-May-1951, 66 y.o.   MRN: [id number] Patient ID: [name], female   DOB: 1951-12-19, 66 y.o.   MRN: [id number] Patient ID: [name], female   DOB: 1951-04-21, 66 y.o.   MRN: [id number] Patient ID: [name], female   DOB: [birth year], 66 y.o.   MRN: [id number] [facility] [address] Progress Note Katherine Middleton MRN: [id number] DOB: [birth year] Age: 66 y.o.  Date: 03/07/2017  Chief Complaint  Patient presents with  . Follow-up  . Depression  . Anxiety    History of present illness. Patient is 66 year old Caucasian female who lives with her boyfriend in [place]. Her husband died in 26-Apr-2010. She is on disability for fibromyalgia arthritis and bipolar disorder.  The patient has been seeing a psychiatrist for years. Initially she was diagnosed with depression but eventually she developed some manic symptoms and now is designated as having bipolar disorder. She's often very tired due to her fibromyalgia and has chronic muscle aches and pains. She feels that the medicines she is on now been very helpful. She sleeping well and her mood is stable. She's not using drugs or alcohol denies suicidal ideation. She's never been psychotic but mentions having  "breakdown" about 10 years ago. She's never been hospitalized. She states that her boyfriend is very good to her and "keeps me laughing" which is helped improve her mood dramatically  The patient returns after 3 months. She is still  walking stiffly from the T1 compression fracture. She hasn't seen a doctor regarding this and I urged her to do so. Her therapist here laughed and she still sad about this but she is getting by. Overall her mood is been stable and she is sleeping well and denies suicidal ideation  Current Outpatient Prescriptions  Medication Sig Dispense Refill  . acetaminophen (TYLENOL 8 HOUR) 650 MG CR tablet Take 650 mg by mouth at bedtime.    . ALPRAZolam (XANAX) 0.5 MG tablet Take 1 tablet (0.5 mg total) by mouth 4 (four) times daily as needed. 360 tablet 1  . Calcium Citrate-Vitamin D (CALCIUM + D PO) Take by mouth daily.    . cetirizine (ZYRTEC) 10 MG tablet Take 10 mg by mouth daily.    Marland Kitchen gabapentin (NEURONTIN) 300 MG capsule Taking 2 Tablet in AM, 2 Tablet in Afternoon and 3 Tablets at Night 210 capsule 2  . omeprazole (PRILOSEC) 20 MG capsule Take 20 mg by mouth daily.     Marland Kitchen PARoxetine (PAXIL) 40 MG tablet Take 1 tablet (40 mg total) by mouth every morning. 90 tablet 3  . simvastatin (ZOCOR) 20 MG tablet Take 20 mg by mouth.    . sodium chloride (OCEAN) 0.65 % SOLN nasal spray Place 1 spray into both nostrils as needed for congestion.    . TiZANidine HCl (ZANAFLEX PO) Take 4 mg by mouth 2 (two) times daily.    Marland Kitchen  traMADol (ULTRAM) 50 MG tablet Take by mouth every 12 (twelve) hours as needed. Reported on 12/29/2015    . traZODone (DESYREL) 150 MG tablet Take 1 tablet (150 mg total) by mouth at bedtime. 90 tablet 3   No current facility-administered medications for this visit.     Past psychiatric history Patient has a long history of depression and bipolar disorder started in 2003.  She has been seeing in this office since then.  She denies any history of suicidal attempt or any inpatient psychiatric treatment however endorse history of severe depression with passive suicidal thinking and anger issues.  She is seeing therapist in this office regularly.  Allergies: Allergies  Allergen Reactions  .  Codeine Itching  . Cymbalta [Duloxetine Hcl] Other (See Comments)    GERD  . Oxycodone Itching and Other (See Comments)    "OUT THERE", climbing the walls  . Baclofen Other (See Comments)    Anxiety got much worse and possibly ppted manic episode  . Betadine [Povidone Iodine] Itching    Particularly if in a douche   . Flonase [Fluticasone]     Makes eye pressure to increase  . Penicillins Swelling  . Sulfa Antibiotics Swelling  . Aspirin Hives and Rash  . Elavil [Amitriptyline] Other (See Comments)    Caused bowels to empty quickly and couldn't sleep on it.  . Latex Other (See Comments)    If in mouth her mouth swells  . Nsaids Nausea Only and Other (See Comments)    Almost passing out, bad acid reflux   Medical History: Past Medical History:  Diagnosis Date  . Arthritis   . Bipolar disorder ([place])   . Fibromyalgia   . GERD (gastroesophageal reflux disease) 12/31/2002  . Osteoarthritis   . Sinusitis 12/31/2012   Surgical History: Past Surgical History:  Procedure Laterality Date  . TONSILLECTOMY  04/08/57   some date around then  . TUBAL LIGATION  10/21/1984   Family History family history includes ADD / ADHD in her other and other; Alcohol abuse in her maternal uncle and paternal uncle; Anxiety disorder in her mother and sister; Bipolar disorder in her father and sister; Dementia (age of onset: 33) in her sister; Healthy in her other; Paranoid behavior in her sister; Schizophrenia in her father.  Vitals: BP (!) 116/57 (BP Location: Right Arm, Patient Position: Sitting, Cuff Size: Normal)   Pulse 65   Ht 5\' 4"  (1.626 m)   Wt 144 lb (65.3 kg)   BMI 24.72 kg/m  Wt loss 10 pounds from last visit.   review of systems:  Positive for falling lightheadedness fatigue otherwise negative  Mental status examination Patient is casually dressed and fairly groomed.He is walking very slowly and stiffly She is calm cooperative and maintained good eye contact. Her speech is soft clear  and coherent. Her thought process is logical linear and goal-directed. She denies active or passive suicidal thinking and homicidal thinking. She denies any auditory or visual hallucination. Her attention and concentration is fair. Her memory functions are within normal limits She described her mood as Okay and her affect is a little blunted There no psychotic symptoms present at this time. She's alert and oriented x3. Her insight judgment and impulse control is okay. He has no evidence of akathisia or movement disorder  Lab Results:  No results found for this or any previous visit (from the past 8736 hour(s)). Assessment Axis I bipolar disorder NOS, anxiety, Vitamin D deficiency by history Axis II deferred Axis  III see medical history Axis IV mild to moderate  Plan: I reviewed her medication and psychosocial history.  She'll continue Paxil daily for depression, Neurontin for anxiety and fibromyalgia symptoms trazodone for insomnia and Xanax for anxiety  Recommend to call us back if she is any question or concern if she feels worse to the symptom.  Followup in 3 months.    MEDICATIONS this encounter: Meds ordered this encounter  Medications  . PARoxetine (PAXIL) 40 MG tablet    Sig: Take 1 tablet (40 mg total) by mouth every morning.    Dispense:  90 tablet    Refill:  3  . traZODone (DESYREL) 150 MG tablet    Sig: Take 1 tablet (150 mg total) by mouth at bedtime.    Dispense:  90 tablet    Refill:  3  . gabapentin (NEURONTIN) 300 MG capsule    Sig: Taking 2 Tablet in AM, 2 Tablet in Afternoon and 3 Tablets at Night    Dispense:  210 capsule    Refill:  2  . ALPRAZolam (XANAX) 0.5 MG tablet    Sig: Take 1 tablet (0.5 mg total) by mouth 4 (four) times daily as needed.    Dispense:  360 tablet    Refill:  1    Medical Decision Making Problem Points:  Established problem, stable/improving (1), Review of last therapy session (1) and Review of psycho-social stressors (1) Data Points:   Review or order clinical lab tests (1) Review of medication regiment & side effects (2)  Amor Hyle, MD

## 2017-06-06 ENCOUNTER — Ambulatory Visit (INDEPENDENT_AMBULATORY_CARE_PROVIDER_SITE_OTHER): Payer: Medicare Other | Admitting: Psychiatry

## 2017-06-06 ENCOUNTER — Encounter (HOSPITAL_COMMUNITY): Payer: Self-pay | Admitting: Psychiatry

## 2017-06-06 VITALS — BP 128/98 | HR 59 | Ht 64.0 in | Wt 140.0 lb

## 2017-06-06 DIAGNOSIS — E559 Vitamin D deficiency, unspecified: Secondary | ICD-10-CM | POA: Diagnosis not present

## 2017-06-06 DIAGNOSIS — M797 Fibromyalgia: Secondary | ICD-10-CM | POA: Diagnosis not present

## 2017-06-06 DIAGNOSIS — F319 Bipolar disorder, unspecified: Secondary | ICD-10-CM | POA: Diagnosis not present

## 2017-06-06 DIAGNOSIS — F419 Anxiety disorder, unspecified: Secondary | ICD-10-CM

## 2017-06-06 MED ORDER — ALPRAZOLAM 0.5 MG PO TABS
0.5000 mg | ORAL_TABLET | Freq: Four times a day (QID) | ORAL | 1 refills | Status: DC | PRN
Start: 2017-06-06 — End: 2017-09-04

## 2017-06-06 MED ORDER — PAROXETINE HCL 40 MG PO TABS: 40.0000 mg | ORAL_TABLET | ORAL | 3 refills | Status: DC

## 2017-06-06 MED ORDER — GABAPENTIN 300 MG PO CAPS: ORAL_CAPSULE | ORAL | 2 refills | Status: DC

## 2017-06-06 MED ORDER — TRAZODONE HCL 150 MG PO TABS: 150.0000 mg | ORAL_TABLET | Freq: Every day | ORAL | 3 refills | Status: DC

## 2017-06-06 NOTE — Progress Notes (Signed)
Patient ID: Katherine Middleton, female   DOB: July 31, 1951, 66 y.o.   MRN: [id number] Patient ID: Katherine Middleton, female   DOB: 1951/09/06, 66 y.o.   MRN: [id number] Patient ID: Katherine Middleton, female   DOB: 31-May-1951, 66 y.o.   MRN: [id number] Patient ID: Katherine Middleton, female   DOB: Jun 02, 1951, 66 y.o.   MRN: [id number] Patient ID: Katherine Middleton, female   DOB: 1951/03/27, 66 y.o.   MRN: [id number] Patient ID: Katherine Middleton, female   DOB: 1951/03/26, 66 y.o.   MRN: [id number] Patient ID: Katherine Middleton, female   DOB: [birth year], 66 y.o.   MRN: [id number] Patient ID: Katherine Middleton, female   DOB: 1951-08-11, 66 y.o.   MRN: [id number] Patient ID: Katherine Middleton, female   DOB: 25-May-1951, 66 y.o.   MRN: [id number] Patient ID: Katherine Middleton, female   DOB: April 06, 1951, 66 y.o.   MRN: [id number] [facility] [address]92[address] Progress Note Katherine Middleton MRN: [id number] DOB: January 31, 1951 Age: 66 y.o.  Date: 06/06/2017  Chief Complaint  Patient presents with  . Depression  . Anxiety  . Follow-up    History of present illness. Patient is 66 year old Caucasian female who lives with her boyfriend in [place]. Her husband died in April 04, 2010. She is on disability for fibromyalgia arthritis and bipolar disorder.  The patient has been seeing a psychiatrist for years. Initially she was diagnosed with depression but eventually she developed some manic symptoms and now is designated as having bipolar disorder. She's often very tired due to her fibromyalgia and has chronic muscle aches and pains. She feels that the medicines she is on now been very helpful. She sleeping well and her mood is stable. She's not using drugs or alcohol denies suicidal ideation. She's never been psychotic but mentions having  "breakdown" about 10 years ago. She's never been hospitalized. She states that her boyfriend is very good to her and "keeps me laughing" which is helped improve her mood dramatically  The patient returns after 3 months. She states that  she got married to her boyfriend last month. She is very excited about this and seems to be in a great mood. He has lots of medical issues and has had frequent falls. She tried to catch him one time and she ended up falling herself and got bruised up on her back. Nonetheless her mood is very good she's sleeping well at night and her anxiety is well controlled. She denies suicidal ideation  Current Outpatient Prescriptions  Medication Sig Dispense Refill  . acetaminophen (TYLENOL 8 HOUR) 650 MG CR tablet Take 650 mg by mouth at bedtime.    . ALPRAZolam (XANAX) 0.5 MG tablet Take 1 tablet (0.5 mg total) by mouth 4 (four) times daily as needed. 360 tablet 1  . Calcium Citrate-Vitamin D (CALCIUM + D PO) Take by mouth daily.    . cetirizine (ZYRTEC) 10 MG tablet Take 10 mg by mouth daily.    Marland Kitchen gabapentin (NEURONTIN) 300 MG capsule Taking 2 Tablet in AM, 2 Tablet in Afternoon and 3 Tablets at Night 210 capsule 2  . omeprazole (PRILOSEC) 20 MG capsule Take 20 mg by mouth daily.     Marland Kitchen PARoxetine (PAXIL) 40 MG tablet Take 1 tablet (40 mg total) by mouth every morning. 90 tablet 3  . simvastatin (ZOCOR) 20 MG tablet Take 20 mg by mouth.    . sodium chloride (OCEAN) 0.65 % SOLN nasal spray Place 1 spray into both nostrils as  needed for congestion.    . TiZANidine HCl (ZANAFLEX PO) Take 4 mg by mouth 2 (two) times daily.    . traMADol (ULTRAM) 50 MG tablet Take by mouth every 12 (twelve) hours as needed. Reported on 12/29/2015    . traZODone (DESYREL) 150 MG tablet Take 1 tablet (150 mg total) by mouth at bedtime. 90 tablet 3   No current facility-administered medications for this visit.     Past psychiatric history Patient has a long history of depression and bipolar disorder started in 2003.  She has been seeing in this office since then.  She denies any history of suicidal attempt or any inpatient psychiatric treatment however endorse history of severe depression with passive suicidal thinking and anger  issues.  She is seeing therapist in this office regularly.  Allergies: Allergies  Allergen Reactions  . Codeine Itching  . Cymbalta [Duloxetine Hcl] Other (See Comments)    GERD  . Oxycodone Itching and Other (See Comments)    "OUT THERE", climbing the walls  . Baclofen Other (See Comments)    Anxiety got much worse and possibly ppted manic episode  . Betadine [Povidone Iodine] Itching    Particularly if in a douche   . Flonase [Fluticasone]     Makes eye pressure to increase  . Penicillins Swelling  . Sulfa Antibiotics Swelling  . Aspirin Hives and Rash  . Elavil [Amitriptyline] Other (See Comments)    Caused bowels to empty quickly and couldn't sleep on it.  . Latex Other (See Comments)    If in mouth her mouth swells  . Nsaids Nausea Only and Other (See Comments)    Almost passing out, bad acid reflux   Medical History: Past Medical History:  Diagnosis Date  . Arthritis   . Bipolar disorder ([place])   . Fibromyalgia   . GERD (gastroesophageal reflux disease) 12/31/2002  . Osteoarthritis   . Sinusitis 12/31/2012   Surgical History: Past Surgical History:  Procedure Laterality Date  . TONSILLECTOMY  04/08/57   some date around then  . TUBAL LIGATION  10/21/1984   Family History family history includes ADD / ADHD in her other and other; Alcohol abuse in her maternal uncle and paternal uncle; Anxiety disorder in her mother and sister; Bipolar disorder in her father and sister; Dementia (age of onset: 42) in her sister; Healthy in her other; Paranoid behavior in her sister; Schizophrenia in her father.  Vitals: BP (!) 128/98   Pulse (!) 59   Ht 5\' 4"  (1.626 m)   Wt 140 lb (63.5 kg)   SpO2 93%   BMI 24.03 kg/m  Wt loss 10 pounds from last visit.   review of systems:  Positive for falling lightheadedness fatigue otherwise negative  Mental status examination Patient is casually dressed and fairly groomed. She is calm cooperative and maintained good eye contact. Her  speech is soft clear and coherent. Her thought process is logical linear and goal-directed. She denies active or passive suicidal thinking and homicidal thinking. She denies any auditory or visual hallucination. Her attention and concentration is fair. Her memory functions are within normal limits She described her mood as Okay and her affect is a little blunted There no psychotic symptoms present at this time. She's alert and oriented x3. Her insight judgment and impulse control is okay. He has no evidence of akathisia or movement disorder  Lab Results:  No results found for this or any previous visit (from the past 8736 hour(s)). Assessment Axis I  bipolar disorder NOS, anxiety, Vitamin D deficiency by history Axis II deferred Axis III see medical history Axis IV mild to moderate  Plan: I reviewed her medication and psychosocial history.  She'll continue Paxil daily for depression, Neurontin for anxiety and fibromyalgia symptoms trazodone for insomnia and Xanax for anxiety  Recommend to call us back if she is any question or concern if she feels worse to the symptom.  Followup in 3 months.    MEDICATIONS this encounter: Meds ordered this encounter  Medications  . traZODone (DESYREL) 150 MG tablet    Sig: Take 1 tablet (150 mg total) by mouth at bedtime.    Dispense:  90 tablet    Refill:  3  . PARoxetine (PAXIL) 40 MG tablet    Sig: Take 1 tablet (40 mg total) by mouth every morning.    Dispense:  90 tablet    Refill:  3  . gabapentin (NEURONTIN) 300 MG capsule    Sig: Taking 2 Tablet in AM, 2 Tablet in Afternoon and 3 Tablets at Night    Dispense:  210 capsule    Refill:  2  . ALPRAZolam (XANAX) 0.5 MG tablet    Sig: Take 1 tablet (0.5 mg total) by mouth 4 (four) times daily as needed.    Dispense:  360 tablet    Refill:  1    Medical Decision Making Problem Points:  Established problem, stable/improving (1), Review of last therapy session (1) and Review of psycho-social  stressors (1) Data Points:  Review or order clinical lab tests (1) Review of medication regiment & side effects (2)  Elster Corbello, MD

## 2017-07-17 ENCOUNTER — Ambulatory Visit (HOSPITAL_COMMUNITY): Payer: Self-pay | Admitting: Licensed Clinical Social Worker

## 2017-09-04 ENCOUNTER — Encounter (HOSPITAL_COMMUNITY): Payer: Self-pay | Admitting: Psychiatry

## 2017-09-04 ENCOUNTER — Ambulatory Visit (INDEPENDENT_AMBULATORY_CARE_PROVIDER_SITE_OTHER): Payer: Medicare Other | Admitting: Psychiatry

## 2017-09-04 VITALS — BP 121/57 | HR 48 | Ht 64.0 in | Wt 144.6 lb

## 2017-09-04 DIAGNOSIS — F319 Bipolar disorder, unspecified: Secondary | ICD-10-CM | POA: Diagnosis not present

## 2017-09-04 DIAGNOSIS — Z811 Family history of alcohol abuse and dependence: Secondary | ICD-10-CM | POA: Diagnosis not present

## 2017-09-04 DIAGNOSIS — E559 Vitamin D deficiency, unspecified: Secondary | ICD-10-CM

## 2017-09-04 DIAGNOSIS — Z736 Limitation of activities due to disability: Secondary | ICD-10-CM

## 2017-09-04 DIAGNOSIS — M1380 Other specified arthritis, unspecified site: Secondary | ICD-10-CM

## 2017-09-04 DIAGNOSIS — Z818 Family history of other mental and behavioral disorders: Secondary | ICD-10-CM

## 2017-09-04 DIAGNOSIS — Z810 Family history of intellectual disabilities: Secondary | ICD-10-CM | POA: Diagnosis not present

## 2017-09-04 DIAGNOSIS — M797 Fibromyalgia: Secondary | ICD-10-CM | POA: Diagnosis not present

## 2017-09-04 MED ORDER — GABAPENTIN 300 MG PO CAPS: ORAL_CAPSULE | ORAL | 2 refills | Status: DC

## 2017-09-04 MED ORDER — PAROXETINE HCL 40 MG PO TABS: 40.0000 mg | ORAL_TABLET | ORAL | 3 refills | Status: DC

## 2017-09-04 MED ORDER — ALPRAZOLAM 0.5 MG PO TABS: 0.5000 mg | ORAL_TABLET | Freq: Four times a day (QID) | ORAL | 1 refills | Status: DC | PRN

## 2017-09-04 MED ORDER — TRAZODONE HCL 150 MG PO TABS: 150.0000 mg | ORAL_TABLET | Freq: Every day | ORAL | 3 refills | Status: DC

## 2017-09-04 NOTE — Progress Notes (Signed)
Patient ID: Katherine Middleton, female   DOB: 02-21-1951, 66 y.o.   MRN: [id number] Patient ID: Katherine Middleton, female   DOB: Jun 27, 1951, 66 y.o.   MRN: [id number] Patient ID: Katherine Middleton, female   DOB: 02/27/1951, 66 y.o.   MRN: [id number] Patient ID: Katherine Middleton, female   DOB: April 20, 1951, 66 y.o.   MRN: [id number] Patient ID: Katherine Middleton, female   DOB: Nov 16, 1951, 66 y.o.   MRN: [id number] Patient ID: Katherine Middleton, female   DOB: Aug 09, 1951, 66 y.o.   MRN: [id number] Patient ID: Katherine Middleton, female   DOB: 1951/05/03, 65 y.o.   MRN: [id number] Patient ID: Katherine Middleton, female   DOB: 1951-02-09, 66 y.o.   MRN: [id number] Patient ID: Katherine Middleton, female   DOB: 1951-12-18, 66 y.o.   MRN: [id number] Patient ID: Katherine Middleton, female   DOB: 03-29-1951, 66 y.o.   MRN: [id number] [facility] 99213 Progress Note Katherine Middleton MRN: [id number] DOB: [birth year] Age: 66 y.o.  Date: 09/04/2017  Chief Complaint  Patient presents with  . Depression  . Anxiety  . Follow-up    History of present illness. Patient is 66 year old Caucasian female who lives with her husband in [place]. . She is on disability for fibromyalgia arthritis and bipolar disorder.  The patient has been seeing a psychiatrist for years. Initially she was diagnosed with depression but eventually she developed some manic symptoms and now is designated as having bipolar disorder. She's often very tired due to her fibromyalgia and has chronic muscle aches and pains. She feels that the medicines she is on now been very helpful. She sleeping well and her mood is stable. She's not using drugs or alcohol denies suicidal ideation. She's never been psychotic but mentions having  "breakdown" about 10 years ago. She's never been hospitalized. She states that her boyfriend is very good to her and "keeps me laughing" which is helped improve her mood dramatically  The patient returns after 3 months. She states that she is doing quite well  for the most part. She had 1 more fall when again she try to help her boyfriend to keep him from falling and she fell backwards in the garage and hit her head. Fortunately she was not passed out. Her mood is been good she is sleeping well and denies any manic or depressed symptoms.  Current Outpatient Prescriptions  Medication Sig Dispense Refill  . acetaminophen (TYLENOL 8 HOUR) 650 MG CR tablet Take 650 mg by mouth at bedtime.    . ALPRAZolam (XANAX) 0.5 MG tablet Take 1 tablet (0.5 mg total) by mouth 4 (four) times daily as needed. 360 tablet 1  . Calcium Citrate-Vitamin D (CALCIUM + D PO) Take by mouth daily.    . cetirizine (ZYRTEC) 10 MG tablet Take 10 mg by mouth daily.    Marland Kitchen gabapentin (NEURONTIN) 300 MG capsule Taking 2 Tablet in AM, 2 Tablet in Afternoon and 3 Tablets at Night 210 capsule 2  . omeprazole (PRILOSEC) 20 MG capsule Take 20 mg by mouth daily.     Marland Kitchen PARoxetine (PAXIL) 40 MG tablet Take 1 tablet (40 mg total) by mouth every morning. 90 tablet 3  . simvastatin (ZOCOR) 20 MG tablet Take 20 mg by mouth.    . sodium chloride (OCEAN) 0.65 % SOLN nasal spray Place 1 spray into both nostrils as needed for congestion.    . TiZANidine HCl (ZANAFLEX PO) Take 4 mg by mouth 2 (two)  times daily.    . traMADol (ULTRAM) 50 MG tablet Take by mouth every 12 (twelve) hours as needed. Reported on 12/29/2015    . traZODone (DESYREL) 150 MG tablet Take 1 tablet (150 mg total) by mouth at bedtime. 90 tablet 3   No current facility-administered medications for this visit.     Past psychiatric history Patient has a long history of depression and bipolar disorder started in 2003.  She has been seeing in this office since then.  She denies any history of suicidal attempt or any inpatient psychiatric treatment however endorse history of severe depression with passive suicidal thinking and anger issues.  She is seeing therapist in this office regularly.  Allergies: Allergies  Allergen Reactions  .  Codeine Itching  . Cymbalta [Duloxetine Hcl] Other (See Comments)    GERD  . Oxycodone Itching and Other (See Comments)    "OUT THERE", climbing the walls  . Baclofen Other (See Comments)    Anxiety got much worse and possibly ppted manic episode  . Betadine [Povidone Iodine] Itching    Particularly if in a douche   . Flonase [Fluticasone]     Makes eye pressure to increase  . Penicillins Swelling  . Sulfa Antibiotics Swelling  . Aspirin Hives and Rash  . Elavil [Amitriptyline] Other (See Comments)    Caused bowels to empty quickly and couldn't sleep on it.  . Latex Other (See Comments)    If in mouth her mouth swells  . Nsaids Nausea Only and Other (See Comments)    Almost passing out, bad acid reflux   Medical History: Past Medical History:  Diagnosis Date  . Arthritis   . Bipolar disorder ([place])   . Fibromyalgia   . GERD (gastroesophageal reflux disease) 12/31/2002  . Osteoarthritis   . Sinusitis 12/31/2012   Surgical History: Past Surgical History:  Procedure Laterality Date  . TONSILLECTOMY  04/08/57   some date around then  . TUBAL LIGATION  10/21/1984   Family History family history includes ADD / ADHD in her other and other; Alcohol abuse in her maternal uncle and paternal uncle; Anxiety disorder in her mother and sister; Bipolar disorder in her father and sister; Dementia (age of onset: 22) in her sister; Healthy in her other; Paranoid behavior in her sister; Schizophrenia in her father.  Vitals: BP (!) 121/57 (BP Location: Right Arm, Patient Position: Sitting, Cuff Size: Normal)   Pulse (!) 48   Ht 5\' 4"  (1.626 m)   Wt 144 lb 9.6 oz (65.6 kg)   BMI 24.82 kg/m  Wt loss 10 pounds from last visit.   review of systems:  Positive for falling lightheadedness fatigue otherwise negative  Mental status examination Patient is casually dressed and fairly groomed. She is calm cooperative and maintained good eye contact. Her speech is soft clear and coherent. Her thought  process is logical linear and goal-directed. She denies active or passive suicidal thinking and homicidal thinking. She denies any auditory or visual hallucination. Her attention and concentration is fair. Her memory functions are within normal limits She described her mood as Good and her affect is bright There no psychotic symptoms present at this time. She's alert and oriented x3. Her insight judgment and impulse control is okay. He has no evidence of akathisia or movement disorder  Lab Results:  No results found for this or any previous visit (from the past 8736 hour(s)). Assessment Axis I bipolar disorder NOS, anxiety, Vitamin D deficiency by history Axis II deferred  Axis III see medical history Axis IV mild to moderate  Plan: I reviewed her medication and psychosocial history.  She'll continue Paxil daily for depression, Neurontin for anxiety and fibromyalgia symptoms trazodone for insomnia and Xanax for anxiety  Recommend to call us back if she is any question or concern if she feels worse to the symptom.  Followup in 3 months.    MEDICATIONS this encounter: Meds ordered this encounter  Medications  . PARoxetine (PAXIL) 40 MG tablet    Sig: Take 1 tablet (40 mg total) by mouth every morning.    Dispense:  90 tablet    Refill:  3  . gabapentin (NEURONTIN) 300 MG capsule    Sig: Taking 2 Tablet in AM, 2 Tablet in Afternoon and 3 Tablets at Night    Dispense:  210 capsule    Refill:  2  . traZODone (DESYREL) 150 MG tablet    Sig: Take 1 tablet (150 mg total) by mouth at bedtime.    Dispense:  90 tablet    Refill:  3  . ALPRAZolam (XANAX) 0.5 MG tablet    Sig: Take 1 tablet (0.5 mg total) by mouth 4 (four) times daily as needed.    Dispense:  360 tablet    Refill:  1    Medical Decision Making Problem Points:  Established problem, stable/improving (1), Review of last therapy session (1) and Review of psycho-social stressors (1) Data Points:  Review or order clinical lab tests  (1) Review of medication regiment & side effects (2)  Comfort Iversen, MD

## 2017-12-04 ENCOUNTER — Encounter (HOSPITAL_COMMUNITY): Payer: Self-pay | Admitting: Psychiatry

## 2017-12-04 ENCOUNTER — Ambulatory Visit (HOSPITAL_COMMUNITY): Payer: Medicare Other | Admitting: Psychiatry

## 2017-12-04 DIAGNOSIS — F419 Anxiety disorder, unspecified: Secondary | ICD-10-CM

## 2017-12-04 DIAGNOSIS — Z818 Family history of other mental and behavioral disorders: Secondary | ICD-10-CM | POA: Diagnosis not present

## 2017-12-04 DIAGNOSIS — M797 Fibromyalgia: Secondary | ICD-10-CM

## 2017-12-04 DIAGNOSIS — F319 Bipolar disorder, unspecified: Secondary | ICD-10-CM

## 2017-12-04 DIAGNOSIS — F17210 Nicotine dependence, cigarettes, uncomplicated: Secondary | ICD-10-CM | POA: Diagnosis not present

## 2017-12-04 DIAGNOSIS — M1990 Unspecified osteoarthritis, unspecified site: Secondary | ICD-10-CM

## 2017-12-04 DIAGNOSIS — Z79899 Other long term (current) drug therapy: Secondary | ICD-10-CM

## 2017-12-04 MED ORDER — TRAZODONE HCL 150 MG PO TABS: 150.0000 mg | ORAL_TABLET | Freq: Every day | ORAL | 3 refills | Status: DC

## 2017-12-04 MED ORDER — GABAPENTIN 300 MG PO CAPS: ORAL_CAPSULE | ORAL | 2 refills | Status: DC

## 2017-12-04 MED ORDER — PAROXETINE HCL 40 MG PO TABS: 40.0000 mg | ORAL_TABLET | ORAL | 3 refills | Status: DC

## 2017-12-04 NOTE — Progress Notes (Signed)
[place] MD/PA/NP OP Progress Note  12/04/2017 1:45 PM Katherine Middleton  MRN:  [id number]  Chief Complaint:  Chief Complaint    Depression; Manic Behavior; Anxiety; Follow-up     HPI: Patient is 66 year old Caucasian female who lives with her husband in [place]. . She is on disability for fibromyalgia arthritis and bipolar disorder.  The patient has been seeing a psychiatrist for years. Initially she was diagnosed with depression but eventually she developed some manic symptoms and now is designated as having bipolar disorder. She's often very tired due to her fibromyalgia and has chronic muscle aches and pains. She feels that the medicines she is on now been very helpful. She sleeping well and her mood is stable. She's not using drugs or alcohol denies suicidal ideation. She's never been psychotic but mentions having  "breakdown" about 10 years ago. She's never been hospitalized. She states that her husband is very good to her and "keeps me laughing" which is helped improve her mood dramatically  Patient returns after 3 months.  For the most part she is doing well.  She has not had any more falls.  She still has some body aches and pains and fatigue related to fibromyalgia.  However overall her mood has been good she is sleeping well and has minimal anxiety.  She denies any current manic symptoms.  She still feels that her medications have been helpful Visit Diagnosis:    ICD-10-CM   1. Bipolar 1 disorder (HCC) F31.9 PARoxetine (PAXIL) 40 MG tablet    traZODone (DESYREL) 150 MG tablet    Past Psychiatric History: Long-term past outpatient treatment  Past Medical History:  Past Medical History:  Diagnosis Date  . Arthritis   . Bipolar disorder ([place])   . Fibromyalgia   . GERD (gastroesophageal reflux disease) 12/31/2002  . Osteoarthritis   . Sinusitis 12/31/2012    Past Surgical History:  Procedure Laterality Date  . TONSILLECTOMY  04/08/57   some date around then  . TUBAL LIGATION   10/21/1984    Family Psychiatric History: See below  Family History:  Family History  Problem Relation Age of Onset  . Bipolar disorder Sister   . Anxiety disorder Sister   . Dementia Sister 63  . Paranoid behavior Sister   . Bipolar disorder Father   . Schizophrenia Father   . Alcohol abuse Maternal Uncle   . Alcohol abuse Paternal Uncle   . Anxiety disorder Mother   . OCD Neg Hx   . Seizures Neg Hx   . Sexual abuse Neg Hx   . Physical abuse Neg Hx   . ADD / ADHD Other   . ADD / ADHD Other   . Healthy Other     Social History:  Social History   Socioeconomic History  . Marital status: Married    Spouse name: None  . Number of children: None  . Years of education: None  . Highest education level: None  Social Needs  . Financial resource strain: None  . Food insecurity - worry: None  . Food insecurity - inability: None  . Transportation needs - medical: None  . Transportation needs - non-medical: None  Occupational History  . None  Tobacco Use  . Smoking status: Current Every Day Smoker    Packs/day: 0.50    Years: 30.00    Pack years: 15.00    Types: Cigarettes  . Smokeless tobacco: Never Used  . Tobacco comment: 11-15 cigs a day as of 05/01/2013  Substance  and Sexual Activity  . Alcohol use: No  . Drug use: No  . Sexual activity: Yes    Partners: Male    Birth control/protection: Post-menopausal  Other Topics Concern  . None  Social History Narrative  . None    Allergies:  Allergies  Allergen Reactions  . Codeine Itching  . Cymbalta [Duloxetine Hcl] Other (See Comments)    GERD  . Oxycodone Itching and Other (See Comments)    "OUT THERE", climbing the walls  . Baclofen Other (See Comments)    Anxiety got much worse and possibly ppted manic episode  . Betadine [Povidone Iodine] Itching    Particularly if in a douche   . Flonase [Fluticasone]     Makes eye pressure to increase  . Penicillins Swelling  . Sulfa Antibiotics Swelling  . Aspirin  Hives and Rash  . Elavil [Amitriptyline] Other (See Comments)    Caused bowels to empty quickly and couldn't sleep on it.  . Latex Other (See Comments)    If in mouth her mouth swells  . Nsaids Nausea Only and Other (See Comments)    Almost passing out, bad acid reflux    Metabolic Disorder Labs: No results found for: HGBA1C, MPG No results found for: PROLACTIN No results found for: CHOL, TRIG, HDL, CHOLHDL, VLDL, LDLCALC No results found for: TSH  Therapeutic Level Labs: No results found for: LITHIUM No results found for: VALPROATE No components found for:  CBMZ  Current Medications: Current Outpatient Medications  Medication Sig Dispense Refill  . acetaminophen (TYLENOL 8 HOUR) 650 MG CR tablet Take 650 mg by mouth at bedtime.    . ALPRAZolam (XANAX) 0.5 MG tablet Take 1 tablet (0.5 mg total) by mouth 4 (four) times daily as needed. 360 tablet 1  . Calcium Citrate-Vitamin D (CALCIUM + D PO) Take by mouth daily.    . cetirizine (ZYRTEC) 10 MG tablet Take 10 mg by mouth daily.    Marland Kitchen gabapentin (NEURONTIN) 300 MG capsule Taking 2 Tablet in AM, 2 Tablet in Afternoon and 3 Tablets at Night 210 capsule 2  . omeprazole (PRILOSEC) 20 MG capsule Take 20 mg by mouth daily.     Marland Kitchen PARoxetine (PAXIL) 40 MG tablet Take 1 tablet (40 mg total) by mouth every morning. 90 tablet 3  . simvastatin (ZOCOR) 20 MG tablet Take 20 mg by mouth.    . sodium chloride (OCEAN) 0.65 % SOLN nasal spray Place 1 spray into both nostrils as needed for congestion.    . TiZANidine HCl (ZANAFLEX PO) Take 4 mg by mouth 2 (two) times daily.    . traZODone (DESYREL) 150 MG tablet Take 1 tablet (150 mg total) by mouth at bedtime. 90 tablet 3   No current facility-administered medications for this visit.      Musculoskeletal: Strength & Muscle Tone: within normal limits Gait & Station: normal Patient leans: N/A  Psychiatric Specialty Exam: Review of Systems  Constitutional: Positive for malaise/fatigue.   Musculoskeletal: Positive for joint pain and myalgias.  All other systems reviewed and are negative.   Blood pressure 132/62, pulse (!) 56, height 5\' 4"  (1.626 m), weight 146 lb (66.2 kg), SpO2 93 %.Body mass index is 25.06 kg/m.  General Appearance: Casual, Neat and Well Groomed  Eye Contact:  Good  Speech:  Clear and Coherent  Volume:  Normal  Mood:  Euthymic  Affect:  Congruent  Thought Process:  Goal Directed  Orientation:  Full (Time, Place, and Person)  Thought Content:  Within normal limits  Suicidal Thoughts:  No  Homicidal Thoughts:  No  Memory:  Immediate;   Good Recent;   Good Remote;   Good  Judgement:  Good  Insight:  Good  Psychomotor Activity:  Decreased  Concentration:  Concentration: Good and Attention Span: Good  Recall:  Good  Fund of Knowledge: Good  Language: Good  Akathisia:  No  Handed:  Right  AIMS (if indicated): not done  Assets:  Communication Skills Desire for Improvement Resilience Social Support Talents/Skills  ADL's:  Intact  Cognition: WNL  Sleep:  Good   Screenings:   Assessment and Plan: Patient is a 66 year old female with a history of bipolar disorder.  She has been stable for a long time on her current regimen.  She will continue Paxil 40 mg daily for depression, gabapentin 600 mg every morning and in the afternoon and 900 mg at night for anxiety, trazodone 150 mg at bedtime for sleep and Xanax 0.5 mg as needed 4 times daily for anxiety.  She will return to see me in 3 months   Levonne Spiller, MD 12/04/2017, 1:45 PM

## 2018-03-03 ENCOUNTER — Other Ambulatory Visit (HOSPITAL_COMMUNITY): Payer: Self-pay | Admitting: Psychiatry

## 2018-03-04 ENCOUNTER — Encounter (HOSPITAL_COMMUNITY): Payer: Self-pay | Admitting: Psychiatry

## 2018-03-04 ENCOUNTER — Ambulatory Visit (HOSPITAL_COMMUNITY): Payer: Medicare Other | Admitting: Psychiatry

## 2018-03-04 DIAGNOSIS — M549 Dorsalgia, unspecified: Secondary | ICD-10-CM | POA: Diagnosis not present

## 2018-03-04 DIAGNOSIS — Z818 Family history of other mental and behavioral disorders: Secondary | ICD-10-CM | POA: Diagnosis not present

## 2018-03-04 DIAGNOSIS — F17210 Nicotine dependence, cigarettes, uncomplicated: Secondary | ICD-10-CM | POA: Diagnosis not present

## 2018-03-04 DIAGNOSIS — Z810 Family history of intellectual disabilities: Secondary | ICD-10-CM

## 2018-03-04 DIAGNOSIS — M1990 Unspecified osteoarthritis, unspecified site: Secondary | ICD-10-CM | POA: Diagnosis not present

## 2018-03-04 DIAGNOSIS — M2550 Pain in unspecified joint: Secondary | ICD-10-CM | POA: Diagnosis not present

## 2018-03-04 DIAGNOSIS — F319 Bipolar disorder, unspecified: Secondary | ICD-10-CM | POA: Diagnosis not present

## 2018-03-04 DIAGNOSIS — Z811 Family history of alcohol abuse and dependence: Secondary | ICD-10-CM

## 2018-03-04 DIAGNOSIS — M797 Fibromyalgia: Secondary | ICD-10-CM | POA: Diagnosis not present

## 2018-03-04 MED ORDER — PAROXETINE HCL 40 MG PO TABS: 40.0000 mg | ORAL_TABLET | ORAL | 3 refills | Status: DC

## 2018-03-04 MED ORDER — GABAPENTIN 300 MG PO CAPS: ORAL_CAPSULE | ORAL | 1 refills | Status: DC

## 2018-03-04 MED ORDER — ALPRAZOLAM 0.5 MG PO TABS: 0.5000 mg | ORAL_TABLET | Freq: Four times a day (QID) | ORAL | 1 refills | Status: DC | PRN

## 2018-03-04 MED ORDER — TRAZODONE HCL 150 MG PO TABS: 150.0000 mg | ORAL_TABLET | Freq: Every day | ORAL | 3 refills | Status: DC

## 2018-03-04 NOTE — Progress Notes (Signed)
[facility] MD/PA/NP OP Progress Note  03/04/2018 1:21 PM Katherine Middleton  MRN:  [id number]  Chief Complaint:  Chief Complaint    Depression; Anxiety; Manic Behavior; Follow-up     HPI: Patient is 67 year old Caucasian female who lives with herhusbandin [place]. . She is on disability for fibromyalgia arthritis and bipolar disorder.  The patient has been seeing a psychiatrist for years. Initially she was diagnosed with depression but eventually she developed some manic symptoms and now is designated as having bipolar disorder. She's often very tired due to her fibromyalgia and has chronic muscle aches and pains. She feels that the medicines she is on now been very helpful. She sleeping well and her mood is stable. She's not using drugs or alcohol denies suicidal ideation. She's never been psychotic but mentions having "breakdown" about 10 years ago. She's never been hospitalized. She states that her husband is very good to her and "keeps me laughing" which is helped improve her mood dramatically  The patient returns after 3 months.  She stated that she fell and bruised her ribs last week after she became unsteady on her feet this is after she took trazodone and and go to bed right away.  I explained that she needed to get in the bed and then take it.  Other than that she has been doing well her mood is been stable and she is not had significant anxiety.  She cannot tolerate most anti-inflammatories so I told her to try hot and cold packs on her bruises.  She cannot sleep without the trazodone so she is going to have to be ultra careful about where and when she takes it.  I also suggested she take a yoga class or some other program to help with balance. Visit Diagnosis:    ICD-10-CM   1. Bipolar 1 disorder (HCC) F31.9 PARoxetine (PAXIL) 40 MG tablet    traZODone (DESYREL) 150 MG tablet    Past Psychiatric History: Long-term past outpatient treatment  Past Medical History:  Past Medical History:   Diagnosis Date  . Arthritis   . Bipolar disorder ([place])   . Fibromyalgia   . GERD (gastroesophageal reflux disease) 12/31/2002  . Osteoarthritis   . Sinusitis 12/31/2012    Past Surgical History:  Procedure Laterality Date  . TONSILLECTOMY  04/08/57   some date around then  . TUBAL LIGATION  10/21/1984    Family Psychiatric History: See below  Family History:  Family History  Problem Relation Age of Onset  . Bipolar disorder Sister   . Anxiety disorder Sister   . Dementia Sister 20  . Paranoid behavior Sister   . Bipolar disorder Father   . Schizophrenia Father   . Alcohol abuse Maternal Uncle   . Alcohol abuse Paternal Uncle   . Anxiety disorder Mother   . OCD Neg Hx   . Seizures Neg Hx   . Sexual abuse Neg Hx   . Physical abuse Neg Hx   . ADD / ADHD Other   . ADD / ADHD Other   . Healthy Other     Social History:  Social History   Socioeconomic History  . Marital status: Married    Spouse name: None  . Number of children: None  . Years of education: None  . Highest education level: None  Social Needs  . Financial resource strain: None  . Food insecurity - worry: None  . Food insecurity - inability: None  . Transportation needs - medical: None  .  Transportation needs - non-medical: None  Occupational History  . None  Tobacco Use  . Smoking status: Current Every Day Smoker    Packs/day: 0.50    Years: 30.00    Pack years: 15.00    Types: Cigarettes  . Smokeless tobacco: Never Used  . Tobacco comment: 11-15 cigs a day as of 05/01/2013  Substance and Sexual Activity  . Alcohol use: No  . Drug use: No  . Sexual activity: Yes    Partners: Male    Birth control/protection: Post-menopausal  Other Topics Concern  . None  Social History Narrative  . None    Allergies:  Allergies  Allergen Reactions  . Codeine Itching  . Cymbalta [Duloxetine Hcl] Other (See Comments)    GERD  . Oxycodone Itching and Other (See Comments)    "OUT THERE", climbing the  walls  . Baclofen Other (See Comments)    Anxiety got much worse and possibly ppted manic episode  . Betadine [Povidone Iodine] Itching    Particularly if in a douche   . Flonase [Fluticasone]     Makes eye pressure to increase  . Penicillins Swelling  . Sulfa Antibiotics Swelling  . Aspirin Hives and Rash  . Elavil [Amitriptyline] Other (See Comments)    Caused bowels to empty quickly and couldn't sleep on it.  . Latex Other (See Comments)    If in mouth her mouth swells  . Nsaids Nausea Only and Other (See Comments)    Almost passing out, bad acid reflux    Metabolic Disorder Labs: No results found for: HGBA1C, MPG No results found for: PROLACTIN No results found for: CHOL, TRIG, HDL, CHOLHDL, VLDL, LDLCALC No results found for: TSH  Therapeutic Level Labs: No results found for: LITHIUM No results found for: VALPROATE No components found for:  CBMZ  Current Medications: Current Outpatient Medications  Medication Sig Dispense Refill  . acetaminophen (TYLENOL 8 HOUR) 650 MG CR tablet Take 650 mg by mouth at bedtime.    . ALPRAZolam (XANAX) 0.5 MG tablet Take 1 tablet (0.5 mg total) by mouth 4 (four) times daily as needed. 360 tablet 1  . Calcium Citrate-Vitamin D (CALCIUM + D PO) Take by mouth daily.    . cetirizine (ZYRTEC) 10 MG tablet Take 10 mg by mouth daily.    Marland Kitchen gabapentin (NEURONTIN) 300 MG capsule TAKE 2 CAPSULES BY MOUTH IN THE MORNING, 2 CAPSULES IN  THE AFTERNOON AND 3  CAPSULES AT NIGHT 630 capsule 1  . omeprazole (PRILOSEC) 20 MG capsule Take 20 mg by mouth daily.     Marland Kitchen PARoxetine (PAXIL) 40 MG tablet Take 1 tablet (40 mg total) by mouth every morning. 90 tablet 3  . simvastatin (ZOCOR) 20 MG tablet Take 20 mg by mouth.    . sodium chloride (OCEAN) 0.65 % SOLN nasal spray Place 1 spray into both nostrils as needed for congestion.    . TiZANidine HCl (ZANAFLEX PO) Take 4 mg by mouth 2 (two) times daily.    . traZODone (DESYREL) 150 MG tablet Take 1 tablet (150  mg total) by mouth at bedtime. 90 tablet 3   No current facility-administered medications for this visit.      Musculoskeletal: Strength & Muscle Tone: decreased Gait & Station: unsteady Patient leans: N/A  Psychiatric Specialty Exam: Review of Systems  Musculoskeletal: Positive for back pain, falls and joint pain.  All other systems reviewed and are negative.   Blood pressure 137/71, pulse (!) 56, height 5\' 4"  (  1.626 m), weight 143 lb (64.9 kg), SpO2 96 %.Body mass index is 24.55 kg/m.  General Appearance: Casual, Neat and Well Groomed  Eye Contact:  Good  Speech:  Clear and Coherent  Volume:  Normal  Mood:  Euthymic  Affect:  Congruent  Thought Process:  Goal Directed  Orientation:  Full (Time, Place, and Person)  Thought Content: WDL   Suicidal Thoughts:  No  Homicidal Thoughts:  No  Memory:  Immediate;   Good Recent;   Good Remote;   Fair  Judgement:  Fair  Insight:  Fair  Psychomotor Activity:  Decreased  Concentration:  Concentration: Good and Attention Span: Good  Recall:  Good  Fund of Knowledge: Good  Language: Good  Akathisia:  No  Handed:  Right  AIMS (if indicated): not done  Assets:  Communication Skills Desire for Improvement Resilience Social Support Talents/Skills  ADL's:  Intact  Cognition: WNL  Sleep:  Good   Screenings:   Assessment and Plan: This patient is a 67 year old female with a history of bipolar disorder.  She has been very stable on her current regimen.  However reminded her to be very careful and only take the trazodone when she gets into bed.  She will continue trazodone 150 mg at bedtime for sleep, Paxil 40 mg daily for depression, gabapentin 600 mg twice a day and 900 mg at bedtime for anxiety and Xanax 0.5 mg 4 times daily as needed for anxiety.  She will return to see me in 3 months   Levonne Spiller, MD 03/04/2018, 1:21 PM

## 2018-06-05 ENCOUNTER — Ambulatory Visit (HOSPITAL_COMMUNITY): Payer: Medicare Other | Admitting: Psychiatry

## 2018-06-05 ENCOUNTER — Encounter (HOSPITAL_COMMUNITY): Payer: Self-pay | Admitting: Psychiatry

## 2018-06-05 DIAGNOSIS — Z811 Family history of alcohol abuse and dependence: Secondary | ICD-10-CM

## 2018-06-05 DIAGNOSIS — Z736 Limitation of activities due to disability: Secondary | ICD-10-CM | POA: Diagnosis not present

## 2018-06-05 DIAGNOSIS — Z818 Family history of other mental and behavioral disorders: Secondary | ICD-10-CM

## 2018-06-05 DIAGNOSIS — M2550 Pain in unspecified joint: Secondary | ICD-10-CM | POA: Diagnosis not present

## 2018-06-05 DIAGNOSIS — M797 Fibromyalgia: Secondary | ICD-10-CM

## 2018-06-05 DIAGNOSIS — F17210 Nicotine dependence, cigarettes, uncomplicated: Secondary | ICD-10-CM

## 2018-06-05 DIAGNOSIS — M1990 Unspecified osteoarthritis, unspecified site: Secondary | ICD-10-CM

## 2018-06-05 DIAGNOSIS — Z810 Family history of intellectual disabilities: Secondary | ICD-10-CM

## 2018-06-05 DIAGNOSIS — F319 Bipolar disorder, unspecified: Secondary | ICD-10-CM

## 2018-06-05 MED ORDER — TRAZODONE HCL 150 MG PO TABS: 150.0000 mg | ORAL_TABLET | Freq: Every day | ORAL | 3 refills | Status: DC

## 2018-06-05 MED ORDER — ALPRAZOLAM 0.5 MG PO TABS: 0.5000 mg | ORAL_TABLET | Freq: Four times a day (QID) | ORAL | 1 refills | Status: DC | PRN

## 2018-06-05 MED ORDER — GABAPENTIN 300 MG PO CAPS: ORAL_CAPSULE | ORAL | 2 refills | Status: DC

## 2018-06-05 MED ORDER — PAROXETINE HCL 40 MG PO TABS: 40.0000 mg | ORAL_TABLET | ORAL | 3 refills | Status: DC

## 2018-06-05 NOTE — Progress Notes (Signed)
[facility] MD/PA/NP OP Progress Note  06/05/2018 1:23 PM Katherine Middleton  MRN:  [id number]  Chief Complaint:  Chief Complaint    Depression; Anxiety; Follow-up     HPI: Patient is 67 year-old Caucasian female who lives with herhusbandin [place]. . She is on disability for fibromyalgia arthritis and bipolar disorder.  The patient has been seeing a psychiatrist for years. Initially she was diagnosed with depression but eventually she developed some manic symptoms and now is designated as having bipolar disorder. She's often very tired due to her fibromyalgia and has chronic muscle aches and pains. She feels that the medicines she is on now been very helpful. She sleeping well and her mood is stable. She's not using drugs or alcohol denies suicidal ideation. She's never been psychotic but mentions having "breakdown" about 10 years ago. She's never been hospitalized. She states that herhusbandis very good to her and "keeps me laughing" which is helped improve her mood dramatically  The patient returns after 3 months.  She  states that she is doing well.  She has had no further falling episodes but tends to walk slowly and carefully.  Her mood is good and she denies any significant anxiety or panic attacks or any thoughts of suicide.  Her anxiety is under good control and her sleep is good as well.  She has no specific complaints today in terms of her mental health.  She feels like her medications have been effective.    Visit Diagnosis:    ICD-10-CM   1. Bipolar 1 disorder (HCC) F31.9 traZODone (DESYREL) 150 MG tablet    PARoxetine (PAXIL) 40 MG tablet    Past Psychiatric History: Long-term outpatient treatment  Past Medical History:  Past Medical History:  Diagnosis Date  . Arthritis   . Bipolar disorder ([place])   . Fibromyalgia   . GERD (gastroesophageal reflux disease) 12/31/2002  . Osteoarthritis   . Sinusitis 12/31/2012    Past Surgical History:  Procedure Laterality Date  .  TONSILLECTOMY  04/08/57   some date around then  . TUBAL LIGATION  10/21/1984    Family Psychiatric History: See below  Family History:  Family History  Problem Relation Age of Onset  . Bipolar disorder Sister   . Anxiety disorder Sister   . Dementia Sister 46  . Paranoid behavior Sister   . Bipolar disorder Father   . Schizophrenia Father   . Alcohol abuse Maternal Uncle   . Alcohol abuse Paternal Uncle   . Anxiety disorder Mother   . OCD Neg Hx   . Seizures Neg Hx   . Sexual abuse Neg Hx   . Physical abuse Neg Hx   . ADD / ADHD Other   . ADD / ADHD Other   . Healthy Other     Social History:  Social History   Socioeconomic History  . Marital status: Married    Spouse name: Not on file  . Number of children: Not on file  . Years of education: Not on file  . Highest education level: Not on file  Occupational History  . Not on file  Social Needs  . Financial resource strain: Not on file  . Food insecurity:    Worry: Not on file    Inability: Not on file  . Transportation needs:    Medical: Not on file    Non-medical: Not on file  Tobacco Use  . Smoking status: Current Every Day Smoker    Packs/day: 0.50  Years: 30.00    Pack years: 15.00    Types: Cigarettes  . Smokeless tobacco: Never Used  . Tobacco comment: 11-15 cigs a day as of 05/01/2013  Substance and Sexual Activity  . Alcohol use: No  . Drug use: No  . Sexual activity: Yes    Partners: Male    Birth control/protection: Post-menopausal  Lifestyle  . Physical activity:    Days per week: Not on file    Minutes per session: Not on file  . Stress: Not on file  Relationships  . Social connections:    Talks on phone: Not on file    Gets together: Not on file    Attends religious service: Not on file    Active member of club or organization: Not on file    Attends meetings of clubs or organizations: Not on file    Relationship status: Not on file  Other Topics Concern  . Not on file  Social  History Narrative  . Not on file    Allergies:  Allergies  Allergen Reactions  . Codeine Itching  . Cymbalta [Duloxetine Hcl] Other (See Comments)    GERD  . Oxycodone Itching and Other (See Comments)    "OUT THERE", climbing the walls  . Baclofen Other (See Comments)    Anxiety got much worse and possibly ppted manic episode  . Betadine [Povidone Iodine] Itching    Particularly if in a douche   . Flonase [Fluticasone]     Makes eye pressure to increase  . Penicillins Swelling  . Sulfa Antibiotics Swelling  . Aspirin Hives and Rash  . Elavil [Amitriptyline] Other (See Comments)    Caused bowels to empty quickly and couldn't sleep on it.  . Latex Other (See Comments)    If in mouth her mouth swells  . Nsaids Nausea Only and Other (See Comments)    Almost passing out, bad acid reflux    Metabolic Disorder Labs: No results found for: HGBA1C, MPG No results found for: PROLACTIN No results found for: CHOL, TRIG, HDL, CHOLHDL, VLDL, LDLCALC No results found for: TSH  Therapeutic Level Labs: No results found for: LITHIUM No results found for: VALPROATE No components found for:  CBMZ  Current Medications: Current Outpatient Medications  Medication Sig Dispense Refill  . acetaminophen (TYLENOL 8 HOUR) 650 MG CR tablet Take 650 mg by mouth at bedtime.    . ALPRAZolam (XANAX) 0.5 MG tablet Take 1 tablet (0.5 mg total) by mouth 4 (four) times daily as needed. 360 tablet 1  . Calcium Citrate-Vitamin D (CALCIUM + D PO) Take by mouth daily.    . cetirizine (ZYRTEC) 10 MG tablet Take 10 mg by mouth daily.    Marland Kitchen gabapentin (NEURONTIN) 300 MG capsule TAKE 2 CAPSULES BY MOUTH IN THE MORNING, 2 CAPSULES IN  THE AFTERNOON AND 3  CAPSULES AT NIGHT 630 capsule 2  . omeprazole (PRILOSEC) 20 MG capsule Take 20 mg by mouth daily.     Marland Kitchen PARoxetine (PAXIL) 40 MG tablet Take 1 tablet (40 mg total) by mouth every morning. 90 tablet 3  . simvastatin (ZOCOR) 20 MG tablet Take 20 mg by mouth.    .  sodium chloride (OCEAN) 0.65 % SOLN nasal spray Place 1 spray into both nostrils as needed for congestion.    . TiZANidine HCl (ZANAFLEX PO) Take 4 mg by mouth 2 (two) times daily.    . traZODone (DESYREL) 150 MG tablet Take 1 tablet (150 mg total) by mouth  at bedtime. 90 tablet 3   No current facility-administered medications for this visit.      Musculoskeletal: Strength & Muscle Tone: within normal limits Gait & Station: unsteady Patient leans: N/A  Psychiatric Specialty Exam: Review of Systems  Musculoskeletal: Positive for joint pain and myalgias.  All other systems reviewed and are negative.   Blood pressure 129/72, pulse 65, height 5\' 4"  (1.626 m), weight 146 lb (66.2 kg), SpO2 93 %.Body mass index is 25.06 kg/m.  General Appearance: Casual, Neat and Well Groomed  Eye Contact:  Good  Speech:  Clear and Coherent  Volume:  Normal  Mood:  Euthymic  Affect:  Congruent  Thought Process:  Goal Directed  Orientation:  Full (Time, Place, and Person)  Thought Content: WDL   Suicidal Thoughts:  No  Homicidal Thoughts:  No  Memory:  Immediate;   Good Recent;   Good Remote;   Good  Judgement:  Good  Insight:  Good  Psychomotor Activity:  Decreased  Concentration:  Concentration: Good and Attention Span: Good  Recall:  Good  Fund of Knowledge: Good  Language: Good  Akathisia:  No  Handed:  Right  AIMS (if indicated): not done  Assets:  Communication Skills Desire for Improvement Resilience Social Support Talents/Skills  ADL's:  Intact  Cognition: WNL  Sleep:  Good   Screenings:   Assessment and Plan: This patient is a 67 year old female with a history of presumed bipolar disorder.  She has been quite stable.  She will continue Xanax 0.5 mg 4 times daily as needed for anxiety, gabapentin X 100 mg twice a day and 900 mg at bedtime for anxiety and neuropathy, Paxil 40 mg daily for depression and trazodone 150 mg daily at bedtime for sleep.  I have asked her to consider  having her primary provider prescribed the gabapentin since she is primarily using it for neuropathic pain.  She will return to see me in 4 months or call sooner if needed   Levonne Spiller, MD 06/05/2018, 1:23 PM

## 2018-10-07 ENCOUNTER — Ambulatory Visit (HOSPITAL_COMMUNITY): Payer: Medicare Other | Admitting: Psychiatry

## 2018-10-07 ENCOUNTER — Encounter (HOSPITAL_COMMUNITY): Payer: Self-pay | Admitting: Psychiatry

## 2018-10-07 DIAGNOSIS — F319 Bipolar disorder, unspecified: Secondary | ICD-10-CM

## 2018-10-07 DIAGNOSIS — F17210 Nicotine dependence, cigarettes, uncomplicated: Secondary | ICD-10-CM | POA: Diagnosis not present

## 2018-10-07 MED ORDER — GABAPENTIN 300 MG PO CAPS
ORAL_CAPSULE | ORAL | 2 refills | Status: DC
Start: 2018-10-07 — End: 2019-02-17

## 2018-10-07 MED ORDER — PAROXETINE HCL 40 MG PO TABS: 40.0000 mg | ORAL_TABLET | ORAL | 3 refills | Status: DC

## 2018-10-07 MED ORDER — ALPRAZOLAM 0.5 MG PO TABS: 0.5000 mg | ORAL_TABLET | Freq: Four times a day (QID) | ORAL | 1 refills | Status: DC | PRN

## 2018-10-07 MED ORDER — TRAZODONE HCL 150 MG PO TABS: 150.0000 mg | ORAL_TABLET | Freq: Every day | ORAL | 3 refills | Status: DC

## 2018-10-07 NOTE — Progress Notes (Signed)
[facility] MD/PA/NP OP Progress Note  10/07/2018 1:18 PM Katherine Middleton  MRN:  [id number]  Chief Complaint:  Chief Complaint    Depression; Anxiety; Follow-up     HPI: Patient is 67 year-old Caucasian female who lives [place]in [place]. . She is on disability for fibromyalgia arthritis and bipolar disorder.  The patient has been seeing a psychiatrist for years. Initially she was diagnosed with depression but eventually she developed some manic symptoms and now is designated as having bipolar disorder. She's often very tired due to her fibromyalgia and has chronic muscle aches and pains. She feels that the medicines she is on now been very helpful. She sleeping well and her mood is stable. She's not using drugs or alcohol denies suicidal ideation. She's never been psychotic but mentions having "breakdown" about 10 years ago. She's never been hospitalized. She states that herhusbandis very good to her and "keeps me laughing" which is helped improve her mood dramatically  The patient returns after 4 months.  For the most part she is doing well.  She has had some unsteadiness and falls a couple of times.  She is not sure why this happens but her balance is a bit off.  It may be due to degenerative disc disease and back pain.  She is walking with a cane today.  She is going to discuss this with her primary doctor as she may need physical therapy for balance.  Overall however her mood is good she is sleeping well she denies depressive or manic symptoms and her anxiety is under good control. Visit Diagnosis:    ICD-10-CM   1. Bipolar 1 disorder (HCC) F31.9 traZODone (DESYREL) 150 MG tablet    PARoxetine (PAXIL) 40 MG tablet    Past Psychiatric History: Long-term outpatient treatment  Past Medical History:  Past Medical History:  Diagnosis Date  . Arthritis   . Bipolar disorder ([place])   . Fibromyalgia   . GERD (gastroesophageal reflux disease) 12/31/2002  . Osteoarthritis   . Sinusitis  12/31/2012    Past Surgical History:  Procedure Laterality Date  . TONSILLECTOMY  04/08/57   some date around then  . TUBAL LIGATION  10/21/1984    Family Psychiatric History: See below  Family History:  Family History  Problem Relation Age of Onset  . Bipolar disorder Sister   . Anxiety disorder Sister   . Dementia Sister 44  . Paranoid behavior Sister   . Bipolar disorder Father   . Schizophrenia Father   . Alcohol abuse Maternal Uncle   . Alcohol abuse Paternal Uncle   . Anxiety disorder Mother   . OCD Neg Hx   . Seizures Neg Hx   . Sexual abuse Neg Hx   . Physical abuse Neg Hx   . ADD / ADHD Other   . ADD / ADHD Other   . Healthy Other     Social History:  Social History   Socioeconomic History  . Marital status: Married    Spouse name: Not on file  . Number of children: Not on file  . Years of education: Not on file  . Highest education level: Not on file  Occupational History  . Not on file  Social Needs  . Financial resource strain: Not on file  . Food insecurity:    Worry: Not on file    Inability: Not on file  . Transportation needs:    Medical: Not on file    Non-medical: Not on file  Tobacco  Use  . Smoking status: Current Every Day Smoker    Packs/day: 0.50    Years: 30.00    Pack years: 15.00    Types: Cigarettes  . Smokeless tobacco: Never Used  . Tobacco comment: 11-15 cigs a day as of 05/01/2013  Substance and Sexual Activity  . Alcohol use: No  . Drug use: No  . Sexual activity: Yes    Partners: Male    Birth control/protection: Post-menopausal  Lifestyle  . Physical activity:    Days per week: Not on file    Minutes per session: Not on file  . Stress: Not on file  Relationships  . Social connections:    Talks on phone: Not on file    Gets together: Not on file    Attends religious service: Not on file    Active member of club or organization: Not on file    Attends meetings of clubs or organizations: Not on file    Relationship  status: Not on file  Other Topics Concern  . Not on file  Social History Narrative  . Not on file    Allergies:  Allergies  Allergen Reactions  . Codeine Itching  . Cymbalta [Duloxetine Hcl] Other (See Comments)    GERD  . Oxycodone Itching and Other (See Comments)    "OUT THERE", climbing the walls  . Baclofen Other (See Comments)    Anxiety got much worse and possibly ppted manic episode  . Betadine [Povidone Iodine] Itching    Particularly if in a douche   . Flonase [Fluticasone]     Makes eye pressure to increase  . Penicillins Swelling  . Sulfa Antibiotics Swelling  . Aspirin Hives and Rash  . Elavil [Amitriptyline] Other (See Comments)    Caused bowels to empty quickly and couldn't sleep on it.  . Latex Other (See Comments)    If in mouth her mouth swells  . Nsaids Nausea Only and Other (See Comments)    Almost passing out, bad acid reflux    Metabolic Disorder Labs: No results found for: HGBA1C, MPG No results found for: PROLACTIN No results found for: CHOL, TRIG, HDL, CHOLHDL, VLDL, LDLCALC No results found for: TSH  Therapeutic Level Labs: No results found for: LITHIUM No results found for: VALPROATE No components found for:  CBMZ  Current Medications: Current Outpatient Medications  Medication Sig Dispense Refill  . acetaminophen (TYLENOL 8 HOUR) 650 MG CR tablet Take 650 mg by mouth at bedtime.    . ALPRAZolam (XANAX) 0.5 MG tablet Take 1 tablet (0.5 mg total) by mouth 4 (four) times daily as needed. 360 tablet 1  . Calcium Citrate-Vitamin D (CALCIUM + D PO) Take by mouth daily.    . cetirizine (ZYRTEC) 10 MG tablet Take 10 mg by mouth daily.    Marland Kitchen gabapentin (NEURONTIN) 300 MG capsule TAKE 2 CAPSULES BY MOUTH IN THE MORNING, 2 CAPSULES IN  THE AFTERNOON AND 3  CAPSULES AT NIGHT 630 capsule 2  . omeprazole (PRILOSEC) 20 MG capsule Take 20 mg by mouth daily.     Marland Kitchen PARoxetine (PAXIL) 40 MG tablet Take 1 tablet (40 mg total) by mouth every morning. 90 tablet  3  . simvastatin (ZOCOR) 20 MG tablet Take 20 mg by mouth.    . sodium chloride (OCEAN) 0.65 % SOLN nasal spray Place 1 spray into both nostrils as needed for congestion.    . TiZANidine HCl (ZANAFLEX PO) Take 4 mg by mouth 2 (two) times daily.    Marland Kitchen  traZODone (DESYREL) 150 MG tablet Take 1 tablet (150 mg total) by mouth at bedtime. 90 tablet 3   No current facility-administered medications for this visit.      Musculoskeletal: Strength & Muscle Tone: decreased Gait & Station: unsteady Patient leans: N/A  Psychiatric Specialty Exam: Review of Systems  Musculoskeletal: Positive for back pain, falls and myalgias.  All other systems reviewed and are negative.   Blood pressure 120/79, pulse 72, height 5\' 4"  (1.626 m), weight 152 lb (68.9 kg), SpO2 95 %.Body mass index is 26.09 kg/m.  General Appearance: Casual, Neat and Well Groomed  Eye Contact:  Good  Speech:  Clear and Coherent  Volume:  Normal  Mood:  Euthymic  Affect:  Congruent  Thought Process:  Goal Directed  Orientation:  Full (Time, Place, and Person)  Thought Content: WDL   Suicidal Thoughts:  No  Homicidal Thoughts:  No  Memory:  Immediate;   Good Recent;   Good Remote;   Good  Judgement:  Good  Insight:  Fair  Psychomotor Activity:  Decreased  Concentration:  Concentration: Good and Attention Span: Good  Recall:  Good  Fund of Knowledge: Good  Language: Good  Akathisia:  No  Handed:  Right  AIMS (if indicated): not done  Assets:  Communication Skills Desire for Improvement Resilience Social Support Talents/Skills  ADL's:  Intact  Cognition: WNL  Sleep:  Good   Screenings:   Assessment and Plan: This patient is a 67 year old female with a history of fibromyalgia chronic fatigue and bipolar disorder.  She has been stable on her current medications.  She will continue trazodone 150 mg at bedtime for sleep, Paxil 40 mg daily for depression, gabapentin 600 mg 3 times daily for anxiety and chronic pain and  Xanax 0.5 mg 4 times daily as needed for anxiety.  She will return to see me in 4 months   Levonne Spiller, MD 10/07/2018, 1:18 PM

## 2018-10-16 ENCOUNTER — Other Ambulatory Visit: Payer: Self-pay | Admitting: Family Medicine

## 2018-10-16 DIAGNOSIS — M810 Age-related osteoporosis without current pathological fracture: Secondary | ICD-10-CM

## 2018-10-16 DIAGNOSIS — Z1231 Encounter for screening mammogram for malignant neoplasm of breast: Secondary | ICD-10-CM

## 2018-12-19 ENCOUNTER — Other Ambulatory Visit: Payer: Self-pay

## 2018-12-19 ENCOUNTER — Ambulatory Visit: Payer: Self-pay

## 2019-02-05 ENCOUNTER — Ambulatory Visit
Admission: RE | Admit: 2019-02-05 | Discharge: 2019-02-05 | Disposition: A | Discharge status: Home / Self Care | Payer: Self-pay | Source: Ambulatory Visit | Attending: Family Medicine | Admitting: Family Medicine

## 2019-02-05 DIAGNOSIS — Z1231 Encounter for screening mammogram for malignant neoplasm of breast: Secondary | ICD-10-CM

## 2019-02-05 DIAGNOSIS — M810 Age-related osteoporosis without current pathological fracture: Secondary | ICD-10-CM

## 2019-02-09 ENCOUNTER — Ambulatory Visit (HOSPITAL_COMMUNITY): Payer: Medicare Other | Admitting: Psychiatry

## 2019-02-17 ENCOUNTER — Encounter (HOSPITAL_COMMUNITY): Payer: Self-pay | Admitting: Psychiatry

## 2019-02-17 ENCOUNTER — Ambulatory Visit (HOSPITAL_COMMUNITY): Payer: Medicare Other | Admitting: Psychiatry

## 2019-02-17 DIAGNOSIS — F319 Bipolar disorder, unspecified: Secondary | ICD-10-CM

## 2019-02-17 MED ORDER — TRAZODONE HCL 150 MG PO TABS: 150.0000 mg | ORAL_TABLET | Freq: Every day | ORAL | 3 refills | Status: DC

## 2019-02-17 MED ORDER — GABAPENTIN 300 MG PO CAPS: ORAL_CAPSULE | ORAL | 2 refills | Status: DC

## 2019-02-17 MED ORDER — ALPRAZOLAM 0.5 MG PO TABS: 0.5000 mg | ORAL_TABLET | Freq: Four times a day (QID) | ORAL | 1 refills | Status: DC | PRN

## 2019-02-17 MED ORDER — PAROXETINE HCL 40 MG PO TABS: 40.0000 mg | ORAL_TABLET | ORAL | 3 refills | Status: DC

## 2019-02-17 NOTE — Progress Notes (Signed)
[facility] MD/PA/NP OP Progress Note  02/17/2019 1:26 PM Katherine Middleton  MRN:  [id number]  Chief Complaint:  Chief Complaint    Depression; Anxiety; Follow-up     HPI:  Patient is 68year-old Caucasian female who lives with herhusbandin [place]. . She is on disability for fibromyalgia arthritis and bipolar disorder.  The patient has been seeing a psychiatrist for years. Initially she was diagnosed with depression but eventually she developed some manic symptoms and now is designated as having bipolar disorder. She's often very tired due to her fibromyalgia and has chronic muscle aches and pains. She feels that the medicines she is on now been very helpful. She sleeping well and her mood is stable. She's not using drugs or alcohol denies suicidal ideation. She's never been psychotic but mentions having "breakdown" about 10 years ago. She's never been hospitalized. She states that herhusbandis very good to her and "keeps me laughing" which is helped improve her mood dramatically  The patient returns after 4 months.  For the most part she is doing okay.  Her husband is still chronically ill with COPD and congestive heart failure.  Right now she is having to take him to the emergency room today because he is not urinating.  She is very worried about him.  She is walking slowly and cautiously with a cane and claims that her back hurts.  However overall she states that her mood is been stable she has not been manic or depressed and her anxiety is under good control.  She is sleeping well. Visit Diagnosis:    ICD-10-CM   1. Bipolar 1 disorder (HCC) F31.9 PARoxetine (PAXIL) 40 MG tablet    traZODone (DESYREL) 150 MG tablet    Past Psychiatric History: Long-term outpatient treatment  Past Medical History:  Past Medical History:  Diagnosis Date  . Arthritis   . Bipolar disorder ([place])   . Fibromyalgia   . GERD (gastroesophageal reflux disease) 12/31/2002  . Osteoarthritis   . Sinusitis 12/31/2012    Past Surgical History:  Procedure Laterality Date  . TONSILLECTOMY  04/08/57   some date around then  . TUBAL LIGATION  10/21/1984    Family Psychiatric History: See below  Family History:  Family History  Problem Relation Age of Onset  . Bipolar disorder Sister   . Anxiety disorder Sister   . Dementia Sister 31  . Paranoid behavior Sister   . Bipolar disorder Father   . Schizophrenia Father   . Alcohol abuse Maternal Uncle   . Alcohol abuse Paternal Uncle   . Anxiety disorder Mother   . ADD / ADHD Other   . ADD / ADHD Other   . Healthy Other   . OCD Neg Hx   . Seizures Neg Hx   . Sexual abuse Neg Hx   . Physical abuse Neg Hx     Social History:  Social History   Socioeconomic History  . Marital status: Married    Spouse name: Not on file  . Number of children: Not on file  . Years of education: Not on file  . Highest education level: Not on file  Occupational History  . Not on file  Social Needs  . Financial resource strain: Not on file  . Food insecurity:    Worry: Not on file    Inability: Not on file  . Transportation needs:    Medical: Not on file    Non-medical: Not on file  Tobacco Use  . Smoking status: Current  Every Day Smoker    Packs/day: 0.50    Years: 30.00    Pack years: 15.00    Types: Cigarettes  . Smokeless tobacco: Never Used  . Tobacco comment: 11-15 cigs a day as of 05/01/2013  Substance and Sexual Activity  . Alcohol use: No  . Drug use: No  . Sexual activity: Yes    Partners: Male    Birth control/protection: Post-menopausal  Lifestyle  . Physical activity:    Days per week: Not on file    Minutes per session: Not on file  . Stress: Not on file  Relationships  . Social connections:    Talks on phone: Not on file    Gets together: Not on file    Attends religious service: Not on file    Active member of club or organization: Not on file    Attends meetings of clubs or organizations: Not on file    Relationship status: Not  on file  Other Topics Concern  . Not on file  Social History Narrative  . Not on file    Allergies:  Allergies  Allergen Reactions  . Codeine Itching  . Cymbalta [Duloxetine Hcl] Other (See Comments)    GERD  . Oxycodone Itching and Other (See Comments)    "OUT THERE", climbing the walls  . Baclofen Other (See Comments)    Anxiety got much worse and possibly ppted manic episode  . Betadine [Povidone Iodine] Itching    Particularly if in a douche   . Flonase [Fluticasone]     Makes eye pressure to increase  . Penicillins Swelling  . Sulfa Antibiotics Swelling  . Aspirin Hives and Rash  . Elavil [Amitriptyline] Other (See Comments)    Caused bowels to empty quickly and couldn't sleep on it.  . Latex Other (See Comments)    If in mouth her mouth swells  . Nsaids Nausea Only and Other (See Comments)    Almost passing out, bad acid reflux    Metabolic Disorder Labs: No results found for: HGBA1C, MPG No results found for: PROLACTIN No results found for: CHOL, TRIG, HDL, CHOLHDL, VLDL, LDLCALC No results found for: TSH  Therapeutic Level Labs: No results found for: LITHIUM No results found for: VALPROATE No components found for:  CBMZ  Current Medications: Current Outpatient Medications  Medication Sig Dispense Refill  . acetaminophen (TYLENOL 8 HOUR) 650 MG CR tablet Take 650 mg by mouth at bedtime.    . ALPRAZolam (XANAX) 0.5 MG tablet Take 1 tablet (0.5 mg total) by mouth 4 (four) times daily as needed. 360 tablet 1  . Calcium Citrate-Vitamin D (CALCIUM + D PO) Take by mouth daily.    . cetirizine (ZYRTEC) 10 MG tablet Take 10 mg by mouth daily.    Marland Kitchen gabapentin (NEURONTIN) 300 MG capsule TAKE 2 CAPSULES BY MOUTH IN THE MORNING, 2 CAPSULES IN  THE AFTERNOON AND 3  CAPSULES AT NIGHT 630 capsule 2  . omeprazole (PRILOSEC) 20 MG capsule Take 20 mg by mouth daily.     Marland Kitchen PARoxetine (PAXIL) 40 MG tablet Take 1 tablet (40 mg total) by mouth every morning. 90 tablet 3  .  simvastatin (ZOCOR) 20 MG tablet Take 20 mg by mouth.    . sodium chloride (OCEAN) 0.65 % SOLN nasal spray Place 1 spray into both nostrils as needed for congestion.    . TiZANidine HCl (ZANAFLEX PO) Take 4 mg by mouth 2 (two) times daily.    . traZODone (DESYREL)  150 MG tablet Take 1 tablet (150 mg total) by mouth at bedtime. 90 tablet 3   No current facility-administered medications for this visit.      Musculoskeletal: Strength & Muscle Tone: decreased Gait & Station: unsteady Patient leans: N/A  Psychiatric Specialty Exam: Review of Systems  Musculoskeletal: Positive for back pain and myalgias.  All other systems reviewed and are negative.   Blood pressure 138/72, pulse 78, height 5\' 4"  (1.626 m), weight 149 lb (67.6 kg), SpO2 94 %.Body mass index is 25.58 kg/m.  General Appearance: Casual and Fairly Groomed  Eye Contact:  Good  Speech:  Clear and Coherent  Volume:  Normal  Mood:  Euthymic  Affect:  Appropriate and Congruent  Thought Process:  Goal Directed  Orientation:  Full (Time, Place, and Person)  Thought Content: Rumination   Suicidal Thoughts:  No  Homicidal Thoughts:  No  Memory:  Immediate;   Good Recent;   Good Remote;   Good  Judgement:  Good  Insight:  Fair  Psychomotor Activity:  Decreased  Concentration:  Concentration: Good and Attention Span: Good  Recall:  Good  Fund of Knowledge: Good  Language: Good  Akathisia:  No  Handed:  Right  AIMS (if indicated): not done  Assets:  Communication Skills Desire for Improvement Resilience Social Support Talents/Skills  ADL's:  Intact  Cognition: WNL  Sleep:  Good   Screenings:   Assessment and Plan: This patient is a 68 year old female with a history of bipolar disorder.  She seems to be doing well on her current regimen.  She will continue Xanax 0.5 mg 4 times daily as needed for anxiety, gabapentin 600 mg twice daily 900 mg at bedtime for anxiety, Paxil 40 mg daily for depression and trazodone 150  mg at bedtime for sleep.  She will return to see me in 4 months   Levonne Spiller, MD 02/17/2019, 1:26 PM

## 2019-06-18 ENCOUNTER — Other Ambulatory Visit: Payer: Self-pay

## 2019-06-18 ENCOUNTER — Ambulatory Visit (INDEPENDENT_AMBULATORY_CARE_PROVIDER_SITE_OTHER): Payer: Medicare Other | Admitting: Psychiatry

## 2019-06-18 ENCOUNTER — Encounter (HOSPITAL_COMMUNITY): Payer: Self-pay | Admitting: Psychiatry

## 2019-06-18 DIAGNOSIS — F319 Bipolar disorder, unspecified: Secondary | ICD-10-CM

## 2019-06-18 MED ORDER — ALPRAZOLAM 0.5 MG PO TABS: 0.5000 mg | ORAL_TABLET | Freq: Four times a day (QID) | ORAL | 1 refills | Status: DC | PRN

## 2019-06-18 MED ORDER — PAROXETINE HCL 40 MG PO TABS: 40.0000 mg | ORAL_TABLET | ORAL | 3 refills | Status: DC

## 2019-06-18 MED ORDER — GABAPENTIN 300 MG PO CAPS: ORAL_CAPSULE | ORAL | 2 refills | Status: DC

## 2019-06-18 MED ORDER — TRAZODONE HCL 150 MG PO TABS: 150.0000 mg | ORAL_TABLET | Freq: Every day | ORAL | 3 refills | Status: DC

## 2019-06-18 NOTE — Progress Notes (Signed)
Virtual Visit via Telephone Note  I connected with Katherine Middleton on 06/18/19 at  1:00 PM EDT by telephone and verified that I am speaking with the correct person using two identifiers.   I discussed the limitations, risks, security and privacy concerns of performing an evaluation and management service by telephone and the availability of in person appointments. I also discussed with the patient that there may be a patient responsible charge related to this service. The patient expressed understanding and agreed to proceed.    I discussed the assessment and treatment plan with the patient. The patient was provided an opportunity to ask questions and all were answered. The patient agreed with the plan and demonstrated an understanding of the instructions.   The patient was advised to call back or seek an in-person evaluation if the symptoms worsen or if the condition fails to improve as anticipated.  I provided 15 minutes of non-face-to-face time during this encounter.   Levonne Spiller, MD  [facility] MD/PA/NP OP Progress Note  06/18/2019 1:19 PM Katherine Middleton  MRN:  [id number]  Chief Complaint:  Chief Complaint    Depression; Anxiety; Follow-up     HPI: Patient is 68year-old Caucasian female who lives with herhusbandin [place]. . She is on disability for fibromyalgia arthritis and bipolar disorder.  The patient has been seeing a psychiatrist for years. Initially she was diagnosed with depression but eventually she developed some manic symptoms and now is designated as having bipolar disorder. She's often very tired due to her fibromyalgia and has chronic muscle aches and pains. She feels that the medicines she is on now been very helpful. She sleeping well and her mood is stable. She's not using drugs or alcohol denies suicidal ideation. She's never been psychotic but mentions having "breakdown" about 10 years ago. She's never been hospitalized. She states that herhusbandis very good  to her and "keeps me laughing" which is helped improve her mood dramatically  The patient returns for follow-up after 4 months.  She states that she is doing okay but her husband is not doing so well.  He is currently in the hospital and it sounds as if he has a UTI and became septic and confused.  She states he has been in there about 4 days and a plan to discharge him to a rehab program because of generalized weakness.  She does not sound too distraught about it and I told her this is her time to recharge since she has been taking care of them for so long.  She states that her back still bothers her and she has to walk with a cane but she is enjoying watching some of her shows on TV.  She denies any symptoms of depression serious anxiety difficulty sleeping or manic symptoms. Visit Diagnosis:    ICD-10-CM   1. Bipolar 1 disorder (HCC)  F31.9 traZODone (DESYREL) 150 MG tablet    PARoxetine (PAXIL) 40 MG tablet    Past Psychiatric History: Long-term outpatient treatment  Past Medical History:  Past Medical History:  Diagnosis Date  . Arthritis   . Bipolar disorder ([place])   . Fibromyalgia   . GERD (gastroesophageal reflux disease) 12/31/2002  . Osteoarthritis   . Sinusitis 12/31/2012    Past Surgical History:  Procedure Laterality Date  . TONSILLECTOMY  04/08/57   some date around then  . TUBAL LIGATION  10/21/1984    Family Psychiatric History: See below  Family History:  Family History  Problem Relation Age  of Onset  . Bipolar disorder Sister   . Anxiety disorder Sister   . Dementia Sister 43  . Paranoid behavior Sister   . Bipolar disorder Father   . Schizophrenia Father   . Alcohol abuse Maternal Uncle   . Alcohol abuse Paternal Uncle   . Anxiety disorder Mother   . ADD / ADHD Other   . ADD / ADHD Other   . Healthy Other   . OCD Neg Hx   . Seizures Neg Hx   . Sexual abuse Neg Hx   . Physical abuse Neg Hx     Social History:  Social History   Socioeconomic History  .  Marital status: Married    Spouse name: Not on file  . Number of children: Not on file  . Years of education: Not on file  . Highest education level: Not on file  Occupational History  . Not on file  Social Needs  . Financial resource strain: Not on file  . Food insecurity    Worry: Not on file    Inability: Not on file  . Transportation needs    Medical: Not on file    Non-medical: Not on file  Tobacco Use  . Smoking status: Current Every Day Smoker    Packs/day: 0.50    Years: 30.00    Pack years: 15.00    Types: Cigarettes  . Smokeless tobacco: Never Used  . Tobacco comment: 11-15 cigs a day as of 05/01/2013  Substance and Sexual Activity  . Alcohol use: No  . Drug use: No  . Sexual activity: Yes    Partners: Male    Birth control/protection: Post-menopausal  Lifestyle  . Physical activity    Days per week: Not on file    Minutes per session: Not on file  . Stress: Not on file  Relationships  . Social Herbalist on phone: Not on file    Gets together: Not on file    Attends religious service: Not on file    Active member of club or organization: Not on file    Attends meetings of clubs or organizations: Not on file    Relationship status: Not on file  Other Topics Concern  . Not on file  Social History Narrative  . Not on file    Allergies:  Allergies  Allergen Reactions  . Codeine Itching  . Cymbalta [Duloxetine Hcl] Other (See Comments)    GERD  . Oxycodone Itching and Other (See Comments)    "OUT THERE", climbing the walls  . Baclofen Other (See Comments)    Anxiety got much worse and possibly ppted manic episode  . Betadine [Povidone Iodine] Itching    Particularly if in a douche   . Flonase [Fluticasone]     Makes eye pressure to increase  . Penicillins Swelling  . Sulfa Antibiotics Swelling  . Aspirin Hives and Rash  . Elavil [Amitriptyline] Other (See Comments)    Caused bowels to empty quickly and couldn't sleep on it.  . Latex  Other (See Comments)    If in mouth her mouth swells  . Nsaids Nausea Only and Other (See Comments)    Almost passing out, bad acid reflux    Metabolic Disorder Labs: No results found for: HGBA1C, MPG No results found for: PROLACTIN No results found for: CHOL, TRIG, HDL, CHOLHDL, VLDL, LDLCALC No results found for: TSH  Therapeutic Level Labs: No results found for: LITHIUM No results found for: VALPROATE  No components found for:  CBMZ  Current Medications: Current Outpatient Medications  Medication Sig Dispense Refill  . acetaminophen (TYLENOL 8 HOUR) 650 MG CR tablet Take 650 mg by mouth at bedtime.    . ALPRAZolam (XANAX) 0.5 MG tablet Take 1 tablet (0.5 mg total) by mouth 4 (four) times daily as needed. 360 tablet 1  . Calcium Citrate-Vitamin D (CALCIUM + D PO) Take by mouth daily.    . cetirizine (ZYRTEC) 10 MG tablet Take 10 mg by mouth daily.    Marland Kitchen gabapentin (NEURONTIN) 300 MG capsule TAKE 2 CAPSULES BY MOUTH IN THE MORNING, 2 CAPSULES IN  THE AFTERNOON AND 3  CAPSULES AT NIGHT 630 capsule 2  . omeprazole (PRILOSEC) 20 MG capsule Take 20 mg by mouth daily.     Marland Kitchen PARoxetine (PAXIL) 40 MG tablet Take 1 tablet (40 mg total) by mouth every morning. 90 tablet 3  . simvastatin (ZOCOR) 20 MG tablet Take 20 mg by mouth.    . sodium chloride (OCEAN) 0.65 % SOLN nasal spray Place 1 spray into both nostrils as needed for congestion.    . TiZANidine HCl (ZANAFLEX PO) Take 4 mg by mouth 2 (two) times daily.    . traZODone (DESYREL) 150 MG tablet Take 1 tablet (150 mg total) by mouth at bedtime. 90 tablet 3   No current facility-administered medications for this visit.      Musculoskeletal: Strength & Muscle Tone: within normal limits Gait & Station: unsteady Patient leans: N/A  Psychiatric Specialty Exam: Review of Systems  Musculoskeletal: Positive for back pain and myalgias.  All other systems reviewed and are negative.   There were no vitals taken for this visit.There is no  height or weight on file to calculate BMI.  General Appearance: NA  Eye Contact:  NA  Speech:  Clear and Coherent  Volume:  Normal  Mood:  Euthymic  Affect:  NA  Thought Process:  Goal Directed  Orientation:  Full (Time, Place, and Person)  Thought Content: Rumination   Suicidal Thoughts:  No  Homicidal Thoughts:  No  Memory:  Immediate;   Good Recent;   Good Remote;   Good  Judgement:  Good  Insight:  Good  Psychomotor Activity:  Decreased  Concentration:  Concentration: Good and Attention Span: Good  Recall:  Good  Fund of Knowledge: Good  Language: Good  Akathisia:  No  Handed:  Right  AIMS (if indicated): not done  Assets:  Communication Skills Desire for Improvement Resilience Social Support Talents/Skills  ADL's:  Intact  Cognition: WNL  Sleep:  Good   Screenings:   Assessment and Plan: This patient is a 68 year old female with a history of bipolar disorder.  She is doing well on her current regimen despite her husband's difficulties.  She will continue Xanax 0.5 mg 4 times daily as needed for anxiety, gabapentin 600 mg twice daily at 900 mg at bedtime for anxiety, Paxil 40 mg daily for depression and trazodone 150 mg at bedtime for sleep.  She will return to see me in 4 months   Levonne Spiller, MD 06/18/2019, 1:19 PM

## 2019-09-28 ENCOUNTER — Other Ambulatory Visit (HOSPITAL_COMMUNITY): Payer: Self-pay | Admitting: Psychiatry

## 2019-09-28 ENCOUNTER — Telehealth (HOSPITAL_COMMUNITY): Payer: Self-pay | Admitting: *Deleted

## 2019-09-28 DIAGNOSIS — F319 Bipolar disorder, unspecified: Secondary | ICD-10-CM

## 2019-09-28 MED ORDER — PAROXETINE HCL 40 MG PO TABS: 40.0000 mg | ORAL_TABLET | ORAL | 3 refills | Status: DC

## 2019-09-28 NOTE — Telephone Encounter (Signed)
We can sent it to a local pharmacy. She has [place] listed so I sent it there

## 2019-09-28 NOTE — Telephone Encounter (Signed)
Patient called stating she failed to call in refill for her Paxil to [facility] in time  & that her home delivery is scheduled for 10/02/2019. And that she has  3 pills left & asked what should she do?

## 2019-09-28 NOTE — Telephone Encounter (Signed)
SPOKE WITH PATIENT & INFORMED PER PROVIDER: PAXIL We can sent it to a local pharmacy. She has [place] listed so I sent it there

## 2019-11-10 ENCOUNTER — Other Ambulatory Visit: Payer: Self-pay

## 2019-11-10 ENCOUNTER — Encounter (HOSPITAL_COMMUNITY): Payer: Self-pay | Admitting: Psychiatry

## 2019-11-10 ENCOUNTER — Ambulatory Visit (INDEPENDENT_AMBULATORY_CARE_PROVIDER_SITE_OTHER): Payer: Medicare Other | Admitting: Psychiatry

## 2019-11-10 DIAGNOSIS — F319 Bipolar disorder, unspecified: Secondary | ICD-10-CM

## 2019-11-10 MED ORDER — TRAZODONE HCL 150 MG PO TABS: 150.0000 mg | ORAL_TABLET | Freq: Every day | ORAL | 0 refills | Status: DC

## 2019-11-10 MED ORDER — PAROXETINE HCL 40 MG PO TABS: 40.0000 mg | ORAL_TABLET | ORAL | 3 refills | Status: DC

## 2019-11-10 MED ORDER — GABAPENTIN 300 MG PO CAPS: ORAL_CAPSULE | ORAL | 2 refills | Status: DC

## 2019-11-10 MED ORDER — ALPRAZOLAM 0.5 MG PO TABS: 0.5000 mg | ORAL_TABLET | Freq: Four times a day (QID) | ORAL | 1 refills | Status: DC | PRN

## 2019-11-10 MED ORDER — TRAZODONE HCL 150 MG PO TABS: 150.0000 mg | ORAL_TABLET | Freq: Every day | ORAL | 3 refills | Status: DC

## 2019-11-10 NOTE — Progress Notes (Signed)
Virtual Visit via Telephone Note  I connected with Katherine Middleton on 11/10/19 at  1:00 PM EST by telephone and verified that I am speaking with the correct person using two identifiers.   I discussed the limitations, risks, security and privacy concerns of performing an evaluation and management service by telephone and the availability of in person appointments. I also discussed with the patient that there may be a patient responsible charge related to this service. The patient expressed understanding and agreed to proceed.    I discussed the assessment and treatment plan with the patient. The patient was provided an opportunity to ask questions and all were answered. The patient agreed with the plan and demonstrated an understanding of the instructions.   The patient was advised to call back or seek an in-person evaluation if the symptoms worsen or if the condition fails to improve as anticipated.  I provided 15 minutes of non-face-to-face time during this encounter.   Levonne Spiller, MD  [facility] MD/PA/NP OP Progress Note  11/10/2019 1:23 PM Katherine Middleton  MRN:  [id number]  Chief Complaint:  Chief Complaint    Depression; Manic Behavior; Follow-up     HPI: Patient is 68year-old widowed Caucasian female who lives alone in [place]. . She is on disability for fibromyalgia arthritis and bipolar disorder.  The patient has been seeing a psychiatrist for years. Initially she was diagnosed with depression but eventually she developed some manic symptoms and now is designated as having bipolar disorder. She's often very tired due to her fibromyalgia and has chronic muscle aches and pains. She feels that the medicines she is on now been very helpful. She sleeping well and her mood is stable. She's not using drugs or alcohol denies suicidal ideation. She's never been psychotic but mentions having "breakdown" about 10 years ago. She's never been hospitalized.   The patient returns for follow-up  after 4 months.  She tells me now that her husband died in late 2023-06-30.  At first she was pretty distraught about it but she is come to terms with it.  She states that he had a history of alcohol abuse and had liver cirrhosis and eventually died of congestive heart failure.  Sadly he was only 68 years old.  She did have some disagreements with his family because he had wanted to be cremated but they insisted on a funeral and she ended up having to pay for most of it.  She has now come to terms with this.  She is sleeping and eating well and is actually sounds fairly upbeat and is thinking about the good memories with her husband.  She feels like her medications of gone a long way to help her "stay sane."   Visit Diagnosis:    ICD-10-CM   1. Bipolar 1 disorder (HCC)  F31.9 PARoxetine (PAXIL) 40 MG tablet    traZODone (DESYREL) 150 MG tablet    DISCONTINUED: traZODone (DESYREL) 150 MG tablet    DISCONTINUED: traZODone (DESYREL) 150 MG tablet    Past Psychiatric History: Long-term outpatient treatment  Past Medical History:  Past Medical History:  Diagnosis Date  . Arthritis   . Bipolar disorder ([place])   . Fibromyalgia   . GERD (gastroesophageal reflux disease) 12/31/2002  . Osteoarthritis   . Sinusitis 12/31/2012    Past Surgical History:  Procedure Laterality Date  . TONSILLECTOMY  04/08/57   some date around then  . TUBAL LIGATION  10/21/1984    Family Psychiatric History: see below  Family History:  Family History  Problem Relation Age of Onset  . Bipolar disorder Sister   . Anxiety disorder Sister   . Dementia Sister 45  . Paranoid behavior Sister   . Bipolar disorder Father   . Schizophrenia Father   . Alcohol abuse Maternal Uncle   . Alcohol abuse Paternal Uncle   . Anxiety disorder Mother   . ADD / ADHD Other   . ADD / ADHD Other   . Healthy Other   . OCD Neg Hx   . Seizures Neg Hx   . Sexual abuse Neg Hx   . Physical abuse Neg Hx     Social History:  Social History    Socioeconomic History  . Marital status: Married    Spouse name: Not on file  . Number of children: Not on file  . Years of education: Not on file  . Highest education level: Not on file  Occupational History  . Not on file  Social Needs  . Financial resource strain: Not on file  . Food insecurity    Worry: Not on file    Inability: Not on file  . Transportation needs    Medical: Not on file    Non-medical: Not on file  Tobacco Use  . Smoking status: Current Every Day Smoker    Packs/day: 0.50    Years: 30.00    Pack years: 15.00    Types: Cigarettes  . Smokeless tobacco: Never Used  . Tobacco comment: 11-15 cigs a day as of 05/01/2013  Substance and Sexual Activity  . Alcohol use: No  . Drug use: No  . Sexual activity: Yes    Partners: Male    Birth control/protection: Post-menopausal  Lifestyle  . Physical activity    Days per week: Not on file    Minutes per session: Not on file  . Stress: Not on file  Relationships  . Social Herbalist on phone: Not on file    Gets together: Not on file    Attends religious service: Not on file    Active member of club or organization: Not on file    Attends meetings of clubs or organizations: Not on file    Relationship status: Not on file  Other Topics Concern  . Not on file  Social History Narrative  . Not on file    Allergies:  Allergies  Allergen Reactions  . Codeine Itching  . Cymbalta [Duloxetine Hcl] Other (See Comments)    GERD  . Oxycodone Itching and Other (See Comments)    "OUT THERE", climbing the walls  . Baclofen Other (See Comments)    Anxiety got much worse and possibly ppted manic episode  . Betadine [Povidone Iodine] Itching    Particularly if in a douche   . Flonase [Fluticasone]     Makes eye pressure to increase  . Penicillins Swelling  . Sulfa Antibiotics Swelling  . Aspirin Hives and Rash  . Elavil [Amitriptyline] Other (See Comments)    Caused bowels to empty quickly and  couldn't sleep on it.  . Latex Other (See Comments)    If in mouth her mouth swells  . Nsaids Nausea Only and Other (See Comments)    Almost passing out, bad acid reflux    Metabolic Disorder Labs: No results found for: HGBA1C, MPG No results found for: PROLACTIN No results found for: CHOL, TRIG, HDL, CHOLHDL, VLDL, LDLCALC No results found for: TSH  Therapeutic Level Labs: No  results found for: LITHIUM No results found for: VALPROATE No components found for:  CBMZ  Current Medications: Current Outpatient Medications  Medication Sig Dispense Refill  . acetaminophen (TYLENOL 8 HOUR) 650 MG CR tablet Take 650 mg by mouth at bedtime.    . ALPRAZolam (XANAX) 0.5 MG tablet Take 1 tablet (0.5 mg total) by mouth 4 (four) times daily as needed. 360 tablet 1  . Calcium Citrate-Vitamin D (CALCIUM + D PO) Take by mouth daily.    . cetirizine (ZYRTEC) 10 MG tablet Take 10 mg by mouth daily.    Marland Kitchen gabapentin (NEURONTIN) 300 MG capsule TAKE 2 CAPSULES BY MOUTH IN THE MORNING, 2 CAPSULES IN  THE AFTERNOON AND 3  CAPSULES AT NIGHT 630 capsule 2  . omeprazole (PRILOSEC) 20 MG capsule Take 20 mg by mouth daily.     Marland Kitchen PARoxetine (PAXIL) 40 MG tablet Take 1 tablet (40 mg total) by mouth every morning. 30 tablet 3  . simvastatin (ZOCOR) 20 MG tablet Take 20 mg by mouth.    . sodium chloride (OCEAN) 0.65 % SOLN nasal spray Place 1 spray into both nostrils as needed for congestion.    . TiZANidine HCl (ZANAFLEX PO) Take 4 mg by mouth 2 (two) times daily.    . traZODone (DESYREL) 150 MG tablet Take 1 tablet (150 mg total) by mouth at bedtime. 30 tablet 0   No current facility-administered medications for this visit.      Musculoskeletal: Strength & Muscle Tone: within normal limits Gait & Station: normal Patient leans: N/A  Psychiatric Specialty Exam: Review of Systems  Musculoskeletal: Positive for back pain and joint pain.  All other systems reviewed and are negative.   There were no vitals  taken for this visit.There is no height or weight on file to calculate BMI.  General Appearance: NA  Eye Contact:  NA  Speech:  Clear and Coherent  Volume:  Normal  Mood:  Euthymic  Affect:  NA  Thought Process:  Goal Directed  Orientation:  Full (Time, Place, and Person)  Thought Content: WDL   Suicidal Thoughts:  No  Homicidal Thoughts:  No  Memory:  Immediate;   Good Recent;   Good Remote;   Good  Judgement:  Good  Insight:  Good  Psychomotor Activity:  Normal  Concentration:  Concentration: Good and Attention Span: Good  Recall:  Good  Fund of Knowledge: Good  Language: Good  Akathisia:  No  Handed:  Right  AIMS (if indicated): not done  Assets:  Communication Skills Desire for Improvement Physical Health Resilience Social Support Talents/Skills  ADL's:  Intact  Cognition: WNL  Sleep:  Good   Screenings:   Assessment and Plan:  This patient is a 68 year old female with a history of bipolar disorder.  Despite her husband's recent death she seems to be doing fairly well.  She will continue Xanax 0.5 mg 4 times daily as needed for anxiety, gabapentin 600 mg twice daily and 900 mg at bedtime for anxiety, Paxil 40 mg daily for depression and trazodone 150 mg at bedtime for sleep.  She will return to see me in 4 months  Levonne Spiller, MD 11/10/2019, 1:23 PM

## 2019-12-01 ENCOUNTER — Telehealth (HOSPITAL_COMMUNITY): Payer: Self-pay | Admitting: *Deleted

## 2019-12-01 NOTE — Telephone Encounter (Signed)
[facility]  P.A. NOT REQUIRED  PAROXETINE TAB 40 MG  MEDICATION IS ON PLAN'S LIST OF COVERED DRUGS

## 2020-01-11 ENCOUNTER — Other Ambulatory Visit (HOSPITAL_COMMUNITY): Payer: Self-pay | Admitting: Psychiatry

## 2020-01-11 ENCOUNTER — Telehealth (HOSPITAL_COMMUNITY): Payer: Self-pay | Admitting: *Deleted

## 2020-01-11 DIAGNOSIS — F319 Bipolar disorder, unspecified: Secondary | ICD-10-CM

## 2020-01-11 MED ORDER — PAROXETINE HCL 40 MG PO TABS: 40.0000 mg | ORAL_TABLET | ORAL | 3 refills | Status: DC

## 2020-01-11 MED ORDER — PAROXETINE HCL 40 MG PO TABS: 40.0000 mg | ORAL_TABLET | ORAL | 0 refills | Status: DC

## 2020-01-11 NOTE — Telephone Encounter (Signed)
Sent, plus 15 day supply sent to [facility]

## 2020-01-11 NOTE — Telephone Encounter (Signed)
Patient called & stated that [facility]  Hasn't sent out her script yet because she's  Telling them it a 90 day supply when they are going by script  & only send a 30 day supply with a cost.  And then it will take 10-12 business days before she receives. So patient is requesting 90 day supply be sent & a 2 week supply be sent to [facility] until her med's arrive PARoxetine (PAXIL) 40 MG tablet 30 tablet 3 11/10/2019    Sig - Route: Take 1 tablet (40 mg total) by mouth every morning. - Oral

## 2020-02-22 ENCOUNTER — Other Ambulatory Visit: Payer: Self-pay | Admitting: Family Medicine

## 2020-02-22 DIAGNOSIS — Z1231 Encounter for screening mammogram for malignant neoplasm of breast: Secondary | ICD-10-CM

## 2020-02-24 ENCOUNTER — Ambulatory Visit: Payer: Self-pay

## 2020-03-04 ENCOUNTER — Ambulatory Visit
Admission: RE | Admit: 2020-03-04 | Discharge: 2020-03-04 | Disposition: A | Discharge status: Home / Self Care | Payer: Medicare Other | Source: Ambulatory Visit | Attending: Family Medicine | Admitting: Family Medicine

## 2020-03-04 ENCOUNTER — Other Ambulatory Visit: Payer: Self-pay

## 2020-03-04 DIAGNOSIS — Z1231 Encounter for screening mammogram for malignant neoplasm of breast: Secondary | ICD-10-CM

## 2020-03-10 ENCOUNTER — Ambulatory Visit (HOSPITAL_COMMUNITY): Payer: Self-pay | Admitting: Psychiatry

## 2020-03-10 ENCOUNTER — Other Ambulatory Visit: Payer: Self-pay

## 2020-04-25 ENCOUNTER — Encounter (HOSPITAL_COMMUNITY): Payer: Self-pay | Admitting: Psychiatry

## 2020-04-25 ENCOUNTER — Other Ambulatory Visit: Payer: Self-pay

## 2020-04-25 ENCOUNTER — Telehealth (INDEPENDENT_AMBULATORY_CARE_PROVIDER_SITE_OTHER): Payer: Medicare Other | Admitting: Psychiatry

## 2020-04-25 DIAGNOSIS — F319 Bipolar disorder, unspecified: Secondary | ICD-10-CM | POA: Diagnosis not present

## 2020-04-25 MED ORDER — TRAZODONE HCL 150 MG PO TABS: 150.0000 mg | ORAL_TABLET | Freq: Every day | ORAL | 2 refills | Status: DC

## 2020-04-25 MED ORDER — PAROXETINE HCL 40 MG PO TABS: 40.0000 mg | ORAL_TABLET | ORAL | 2 refills | Status: DC

## 2020-04-25 MED ORDER — GABAPENTIN 300 MG PO CAPS: ORAL_CAPSULE | ORAL | 2 refills | Status: DC

## 2020-04-25 MED ORDER — ALPRAZOLAM 0.5 MG PO TABS: 0.5000 mg | ORAL_TABLET | Freq: Four times a day (QID) | ORAL | 1 refills | Status: DC | PRN

## 2020-04-25 NOTE — Progress Notes (Signed)
Virtual Visit via Telephone Note  I connected with Katherine Middleton on 04/25/20 at  3:00 PM EDT by telephone and verified that I am speaking with the correct person using two identifiers.   I discussed the limitations, risks, security and privacy concerns of performing an evaluation and management service by telephone and the availability of in person appointments. I also discussed with the patient that there may be a patient responsible charge related to this service. The patient expressed understanding and agreed to proceed.    I discussed the assessment and treatment plan with the patient. The patient was provided an opportunity to ask questions and all were answered. The patient agreed with the plan and demonstrated an understanding of the instructions.   The patient was advised to call back or seek an in-person evaluation if the symptoms worsen or if the condition fails to improve as anticipated.  I provided 15 minutes of non-face-to-face time during this encounter.   Levonne Spiller, MD  [facility] MD/PA/NP OP Progress Note  04/25/2020 3:14 PM [place]  MRN:  [id number]  Chief Complaint:  Chief Complaint    Depression; Anxiety; Follow-up     HPI: Patient is 69year-old widowed Caucasian female who lives alone in [place]. . She is on disability for fibromyalgia arthritis and bipolar disorder.  The patient has been seeing a psychiatrist for years. Initially she was diagnosed with depression but eventually she developed some manic symptoms and now is designated as having bipolar disorder. She's often very tired due to her fibromyalgia and has chronic muscle aches and pains. She feels that the medicines she is on now been very helpful. She sleeping well and her mood is stable. She's not using drugs or alcohol denies suicidal ideation. She's never been psychotic but mentions having "breakdown" about 10 years ago. She's never been hospitalized.   The patient returns for follow-up  after 6 months.  We have missed some appointments.  She states however overall she is doing well.  Her husband died last 2023/07/01 but she is coping with it well.  She still talks to friends and family quite often and she does not feel lonely.  She is sleeping well her mood is good and she denies any significant mood swings.  She denies thoughts of self-harm or suicide.  She denies manic symptoms.  She sounds very calm.  She still feels that all of her medications have been helpful. Visit Diagnosis:    ICD-10-CM   1. Bipolar 1 disorder (HCC)  F31.9 traZODone (DESYREL) 150 MG tablet    PARoxetine (PAXIL) 40 MG tablet    Past Psychiatric History: Long-term outpatient treatment  Past Medical History:  Past Medical History:  Diagnosis Date  . Arthritis   . Bipolar disorder ([place])   . Fibromyalgia   . GERD (gastroesophageal reflux disease) 12/31/2002  . Osteoarthritis   . Sinusitis 12/31/2012    Past Surgical History:  Procedure Laterality Date  . TONSILLECTOMY  04/08/57   some date around then  . TUBAL LIGATION  10/21/1984    Family Psychiatric History: see below  Family History:  Family History  Problem Relation Age of Onset  . Bipolar disorder Sister   . Anxiety disorder Sister   . Dementia Sister 60  . Paranoid behavior Sister   . Bipolar disorder Father   . Schizophrenia Father   . Alcohol abuse Maternal Uncle   . Alcohol abuse Paternal Uncle   . Anxiety disorder Mother   . ADD / ADHD  Other   . ADD / ADHD Other   . Healthy Other   . OCD Neg Hx   . Seizures Neg Hx   . Sexual abuse Neg Hx   . Physical abuse Neg Hx     Social History:  Social History   Socioeconomic History  . Marital status: Married    Spouse name: Not on file  . Number of children: Not on file  . Years of education: Not on file  . Highest education level: Not on file  Occupational History  . Not on file  Tobacco Use  . Smoking status: Current Every Day Smoker    Packs/day: 0.50    Years: 30.00     Pack years: 15.00    Types: Cigarettes  . Smokeless tobacco: Never Used  . Tobacco comment: 11-15 cigs a day as of 05/01/2013  Substance and Sexual Activity  . Alcohol use: No  . Drug use: No  . Sexual activity: Yes    Partners: Male    Birth control/protection: Post-menopausal  Other Topics Concern  . Not on file  Social History Narrative  . Not on file   Social Determinants of Health   Financial Resource Strain:   . Difficulty of Paying Living Expenses:   Food Insecurity:   . Worried About Charity fundraiser in the Last Year:   . Arboriculturist in the Last Year:   Transportation Needs:   . Film/video editor (Medical):   Marland Kitchen Lack of Transportation (Non-Medical):   Physical Activity:   . Days of Exercise per Week:   . Minutes of Exercise per Session:   Stress:   . Feeling of Stress :   Social Connections:   . Frequency of Communication with Friends and Family:   . Frequency of Social Gatherings with Friends and Family:   . Attends Religious Services:   . Active Member of Clubs or Organizations:   . Attends Archivist Meetings:   Marland Kitchen Marital Status:     Allergies:  Allergies  Allergen Reactions  . Codeine Itching  . Cymbalta [Duloxetine Hcl] Other (See Comments)    GERD  . Oxycodone Itching and Other (See Comments)    "OUT THERE", climbing the walls  . Baclofen Other (See Comments)    Anxiety got much worse and possibly ppted manic episode  . Betadine [Povidone Iodine] Itching    Particularly if in a douche   . Flonase [Fluticasone]     Makes eye pressure to increase  . Penicillins Swelling  . Sulfa Antibiotics Swelling  . Aspirin Hives and Rash  . Elavil [Amitriptyline] Other (See Comments)    Caused bowels to empty quickly and couldn't sleep on it.  . Latex Other (See Comments)    If in mouth her mouth swells  . Nsaids Nausea Only and Other (See Comments)    Almost passing out, bad acid reflux    Metabolic Disorder Labs: No results found  for: HGBA1C, MPG No results found for: PROLACTIN No results found for: CHOL, TRIG, HDL, CHOLHDL, VLDL, LDLCALC No results found for: TSH  Therapeutic Level Labs: No results found for: LITHIUM No results found for: VALPROATE No components found for:  CBMZ  Current Medications: Current Outpatient Medications  Medication Sig Dispense Refill  . acetaminophen (TYLENOL 8 HOUR) 650 MG CR tablet Take 650 mg by mouth at bedtime.    . ALPRAZolam (XANAX) 0.5 MG tablet Take 1 tablet (0.5 mg total) by mouth 4 (four)  times daily as needed. 360 tablet 1  . Calcium Citrate-Vitamin D (CALCIUM + D PO) Take by mouth daily.    . cetirizine (ZYRTEC) 10 MG tablet Take 10 mg by mouth daily.    Marland Kitchen gabapentin (NEURONTIN) 300 MG capsule TAKE 2 CAPSULES BY MOUTH IN THE MORNING, 2 CAPSULES IN  THE AFTERNOON AND 3  CAPSULES AT NIGHT 630 capsule 2  . omeprazole (PRILOSEC) 20 MG capsule Take 20 mg by mouth daily.     Marland Kitchen PARoxetine (PAXIL) 40 MG tablet Take 1 tablet (40 mg total) by mouth every morning. 90 tablet 2  . simvastatin (ZOCOR) 20 MG tablet Take 20 mg by mouth.    . sodium chloride (OCEAN) 0.65 % SOLN nasal spray Place 1 spray into both nostrils as needed for congestion.    . TiZANidine HCl (ZANAFLEX PO) Take 4 mg by mouth 2 (two) times daily.    . traZODone (DESYREL) 150 MG tablet Take 1 tablet (150 mg total) by mouth at bedtime. 90 tablet 2   No current facility-administered medications for this visit.     Musculoskeletal: Strength & Muscle Tone: within normal limits Gait & Station: unsteady Patient leans: N/A  Psychiatric Specialty Exam: Review of Systems  Musculoskeletal: Positive for arthralgias and myalgias.  All other systems reviewed and are negative.   There were no vitals taken for this visit.There is no height or weight on file to calculate BMI.  General Appearance: NA  Eye Contact:  NA  Speech:  Clear and Coherent  Volume:  Normal  Mood:  Euthymic  Affect:  NA  Thought Process:   Goal Directed  Orientation:  Full (Time, Place, and Person)  Thought Content: WDL   Suicidal Thoughts:  No  Homicidal Thoughts:  No  Memory:  Immediate;   Good Recent;   Good Remote;   Good  Judgement:  Good  Insight:  Good  Psychomotor Activity:  Decreased  Concentration:  Concentration: Good and Attention Span: Good  Recall:  Good  Fund of Knowledge: Good  Language: Good  Akathisia:  No  Handed:  Right  AIMS (if indicated): not done  Assets:  Communication Skills Desire for Improvement Resilience Social Support Talents/Skills  ADL's:  Intact  Cognition: WNL  Sleep:  Good   Screenings:   Assessment and Plan: This patient is a 69 year old female with a history of bipolar disorder.  She seems to continue to do well.  She will continue Xanax 0.5 mg 4 times daily as needed for anxiety, gabapentin 600 mg twice daily and 900 mg at bedtime for anxiety, Paxil 40 mg daily for depression and trazodone 150 mg at bedtime for sleep.  She will return to see me in 4 months   Levonne Spiller, MD 04/25/2020, 3:14 PM

## 2020-09-01 ENCOUNTER — Encounter (HOSPITAL_COMMUNITY): Payer: Self-pay | Admitting: Psychiatry

## 2020-09-01 ENCOUNTER — Other Ambulatory Visit: Payer: Self-pay

## 2020-09-01 ENCOUNTER — Telehealth (INDEPENDENT_AMBULATORY_CARE_PROVIDER_SITE_OTHER): Payer: Medicare Other | Admitting: Psychiatry

## 2020-09-01 DIAGNOSIS — F319 Bipolar disorder, unspecified: Secondary | ICD-10-CM | POA: Diagnosis not present

## 2020-09-01 MED ORDER — TRAZODONE HCL 150 MG PO TABS: 150.0000 mg | ORAL_TABLET | Freq: Every day | ORAL | 2 refills | Status: DC

## 2020-09-01 MED ORDER — GABAPENTIN 300 MG PO CAPS: ORAL_CAPSULE | ORAL | 2 refills | Status: DC

## 2020-09-01 MED ORDER — PAROXETINE HCL 40 MG PO TABS: 40.0000 mg | ORAL_TABLET | ORAL | 2 refills | Status: DC

## 2020-09-01 MED ORDER — ALPRAZOLAM 0.5 MG PO TABS
0.5000 mg | ORAL_TABLET | Freq: Four times a day (QID) | ORAL | 1 refills | Status: DC | PRN
Start: 2020-09-01 — End: 2020-11-30

## 2020-09-01 NOTE — Progress Notes (Signed)
Virtual Visit via Telephone Note  I connected with Katherine Middleton on 09/01/20 at  1:00 PM EDT by telephone and verified that I am speaking with the correct person using two identifiers.   I discussed the limitations, risks, security and privacy concerns of performing an evaluation and management service by telephone and the availability of in person appointments. I also discussed with the patient that there may be a patient responsible charge related to this service. The patient expressed understanding and agreed to proceed    I discussed the assessment and treatment plan with the patient. The patient was provided an opportunity to ask questions and all were answered. The patient agreed with the plan and demonstrated an understanding of the instructions.   The patient was advised to call back or seek an in-person evaluation if the symptoms worsen or if the condition fails to improve as anticipated.  I provided 15 minutes of non-face-to-face time during this encounter. Location: Provider office, patient home  Levonne Spiller, MD  [facility] MD/PA/NP OP Progress Note  09/01/2020 1:21 PM Katherine Middleton  MRN:  [id number]  Chief Complaint:  Chief Complaint    Depression; Anxiety; Follow-up     HPI: Patient is 69year-oldwidowedCaucasian female who livesalonein [place]. . She is on disability for fibromyalgia arthritis and bipolar disorder.  The patient has been seeing a psychiatrist for years. Initially she was diagnosed with depression but eventually she developed some manic symptoms and now is designated as having bipolar disorder. She's often very tired due to her fibromyalgia and has chronic muscle aches and pains. She feels that the medicines she is on now been very helpful. She sleeping well and her mood is stable. She's not using drugs or alcohol denies suicidal ideation. She's never been psychotic but mentions having "breakdown" about 10 years ago. She's never been  hospitalized.  The patient returns for follow-up after about 4 months.  She states she is doing well but I noticed an her last visit with her primary care physician last week she has lost about 40 pounds in the last year.  She states that when her husband was alive they used to cook together but now she does not have any interest.  She may be that eat a muffin in the morning and a sandwich at lunch and nothing else through the day.  I explained that if she kept her calorie content to slow over time she would continue to lose weight and she really cannot afford to now.  She is down to 110 pounds.  She denies being depressed or sad.  She denies significant anxiety or mood swings.  She is sleeping well and is getting out with friends occasionally.  She states that she will definitely work on her diet.  All of her laboratories were normal. Visit Diagnosis:    ICD-10-CM   1. Bipolar 1 disorder (HCC)  F31.9 PARoxetine (PAXIL) 40 MG tablet    traZODone (DESYREL) 150 MG tablet    Past Psychiatric History: Long-term outpatient treatment  Past Medical History:  Past Medical History:  Diagnosis Date  . Arthritis   . Bipolar disorder ([place])   . Fibromyalgia   . GERD (gastroesophageal reflux disease) 12/31/2002  . Osteoarthritis   . Sinusitis 12/31/2012    Past Surgical History:  Procedure Laterality Date  . TONSILLECTOMY  04/08/57   some date around then  . TUBAL LIGATION  10/21/1984    Family Psychiatric History: see below  Family History:  Family History  Problem Relation Age of Onset  . Bipolar disorder Sister   . Anxiety disorder Sister   . Dementia Sister 24  . Paranoid behavior Sister   . Bipolar disorder Father   . Schizophrenia Father   . Alcohol abuse Maternal Uncle   . Alcohol abuse Paternal Uncle   . Anxiety disorder Mother   . ADD / ADHD Other   . ADD / ADHD Other   . Healthy Other   . OCD Neg Hx   . Seizures Neg Hx   . Sexual abuse Neg Hx   . Physical abuse Neg Hx      Social History:  Social History   Socioeconomic History  . Marital status: Married    Spouse name: Not on file  . Number of children: Not on file  . Years of education: Not on file  . Highest education level: Not on file  Occupational History  . Not on file  Tobacco Use  . Smoking status: Current Every Day Smoker    Packs/day: 0.50    Years: 30.00    Pack years: 15.00    Types: Cigarettes  . Smokeless tobacco: Never Used  . Tobacco comment: 11-15 cigs a day as of 05/01/2013  Substance and Sexual Activity  . Alcohol use: No  . Drug use: No  . Sexual activity: Yes    Partners: Male    Birth control/protection: Post-menopausal  Other Topics Concern  . Not on file  Social History Narrative  . Not on file   Social Determinants of Health   Financial Resource Strain:   . Difficulty of Paying Living Expenses: Not on file  Food Insecurity:   . Worried About Charity fundraiser in the Last Year: Not on file  . Ran Out of Food in the Last Year: Not on file  Transportation Needs:   . Lack of Transportation (Medical): Not on file  . Lack of Transportation (Non-Medical): Not on file  Physical Activity:   . Days of Exercise per Week: Not on file  . Minutes of Exercise per Session: Not on file  Stress:   . Feeling of Stress : Not on file  Social Connections:   . Frequency of Communication with Friends and Family: Not on file  . Frequency of Social Gatherings with Friends and Family: Not on file  . Attends Religious Services: Not on file  . Active Member of Clubs or Organizations: Not on file  . Attends Archivist Meetings: Not on file  . Marital Status: Not on file    Allergies:  Allergies  Allergen Reactions  . Codeine Itching  . Cymbalta [Duloxetine Hcl] Other (See Comments)    GERD  . Oxycodone Itching and Other (See Comments)    "OUT THERE", climbing the walls  . Baclofen Other (See Comments)    Anxiety got much worse and possibly ppted manic episode   . Betadine [Povidone Iodine] Itching    Particularly if in a douche   . Flonase [Fluticasone]     Makes eye pressure to increase  . Penicillins Swelling  . Sulfa Antibiotics Swelling  . Aspirin Hives and Rash  . Elavil [Amitriptyline] Other (See Comments)    Caused bowels to empty quickly and couldn't sleep on it.  . Latex Other (See Comments)    If in mouth her mouth swells  . Nsaids Nausea Only and Other (See Comments)    Almost passing out, bad acid reflux    Metabolic Disorder Labs: No  results found for: HGBA1C, MPG No results found for: PROLACTIN No results found for: CHOL, TRIG, HDL, CHOLHDL, VLDL, LDLCALC No results found for: TSH  Therapeutic Level Labs: No results found for: LITHIUM No results found for: VALPROATE No components found for:  CBMZ  Current Medications: Current Outpatient Medications  Medication Sig Dispense Refill  . acetaminophen (TYLENOL 8 HOUR) 650 MG CR tablet Take 650 mg by mouth at bedtime.    . ALPRAZolam (XANAX) 0.5 MG tablet Take 1 tablet (0.5 mg total) by mouth 4 (four) times daily as needed. 360 tablet 1  . Calcium Citrate-Vitamin D (CALCIUM + D PO) Take by mouth daily.    . cetirizine (ZYRTEC) 10 MG tablet Take 10 mg by mouth daily.    Marland Kitchen gabapentin (NEURONTIN) 300 MG capsule TAKE 2 CAPSULES BY MOUTH IN THE MORNING, 2 CAPSULES IN  THE AFTERNOON AND 3  CAPSULES AT NIGHT 630 capsule 2  . omeprazole (PRILOSEC) 20 MG capsule Take 20 mg by mouth daily.     Marland Kitchen PARoxetine (PAXIL) 40 MG tablet Take 1 tablet (40 mg total) by mouth every morning. 90 tablet 2  . simvastatin (ZOCOR) 20 MG tablet Take 20 mg by mouth.    . sodium chloride (OCEAN) 0.65 % SOLN nasal spray Place 1 spray into both nostrils as needed for congestion.    . TiZANidine HCl (ZANAFLEX PO) Take 4 mg by mouth 2 (two) times daily.    . traZODone (DESYREL) 150 MG tablet Take 1 tablet (150 mg total) by mouth at bedtime. 90 tablet 2   No current facility-administered medications for this  visit.     Musculoskeletal: Strength & Muscle Tone: within normal limits Gait & Station: normal Patient leans: N/A  Psychiatric Specialty Exam: Review of Systems  Constitutional: Positive for unexpected weight change.  All other systems reviewed and are negative.   There were no vitals taken for this visit.There is no height or weight on file to calculate BMI.  General Appearance: NA  Eye Contact:  NA  Speech:  Clear and Coherent  Volume:  Normal  Mood:  Euthymic  Affect:  NA  Thought Process:  Goal Directed  Orientation:  Full (Time, Place, and Person)  Thought Content: WDL   Suicidal Thoughts:  No  Homicidal Thoughts:  No  Memory:  Immediate;   Good Recent;   Good Remote;   Good  Judgement:  Fair  Insight:  Fair  Psychomotor Activity:  Decreased  Concentration:  Concentration: Good and Attention Span: Good  Recall:  Good  Fund of Knowledge: Good  Language: Good  Akathisia:  No  Handed:  Right  AIMS (if indicated): not done  Assets:  Communication Skills Desire for Improvement Resilience Social Support Talents/Skills  ADL's:  Intact  Cognition: WNL  Sleep:  Good   Screenings:   Assessment and Plan: This patient 69 year old female with a history of bipolar disorder.  She has lost quite a bit of weight and claims she is just not that interested in food right now.  I urged her to try to do improve meal planning to help with her mental and physical health.  She agrees.  She will continue Xanax 0.5 mg 4 times daily as needed for anxiety, gabapentin 600 mg twice daily 900 mg at bedtime for anxiety, Paxil 40 mg daily for depression and trazodone 150 mg at bedtime for sleep.  She will return to see me in 3 months   Levonne Spiller, MD 09/01/2020, 1:21 PM

## 2020-11-30 ENCOUNTER — Other Ambulatory Visit: Payer: Self-pay

## 2020-11-30 ENCOUNTER — Telehealth (INDEPENDENT_AMBULATORY_CARE_PROVIDER_SITE_OTHER): Payer: Medicare Other | Admitting: Psychiatry

## 2020-11-30 ENCOUNTER — Encounter (HOSPITAL_COMMUNITY): Payer: Self-pay | Admitting: Psychiatry

## 2020-11-30 DIAGNOSIS — F319 Bipolar disorder, unspecified: Secondary | ICD-10-CM | POA: Diagnosis not present

## 2020-11-30 MED ORDER — PAROXETINE HCL 40 MG PO TABS: 40.0000 mg | ORAL_TABLET | ORAL | 2 refills | Status: DC

## 2020-11-30 MED ORDER — TRAZODONE HCL 150 MG PO TABS: 150.0000 mg | ORAL_TABLET | Freq: Every day | ORAL | 2 refills | Status: DC

## 2020-11-30 MED ORDER — GABAPENTIN 300 MG PO CAPS: ORAL_CAPSULE | ORAL | 2 refills | Status: DC

## 2020-11-30 MED ORDER — ALPRAZOLAM 0.5 MG PO TABS
0.5000 mg | ORAL_TABLET | Freq: Four times a day (QID) | ORAL | 1 refills | Status: DC | PRN
Start: 2020-11-30 — End: 2021-01-18

## 2020-11-30 NOTE — Progress Notes (Signed)
Virtual Visit via Telephone Note  I connected with Katherine Middleton on 11/30/20 at  3:00 PM EST by telephone and verified that I am speaking with the correct person using two identifiers.  Location: Patient: home Provider: office   I discussed the limitations, risks, security and privacy concerns of performing an evaluation and management service by telephone and the availability of in person appointments. I also discussed with the patient that there may be a patient responsible charge related to this service. The patient expressed understanding and agreed to proceed.     I discussed the assessment and treatment plan with the patient. The patient was provided an opportunity to ask questions and all were answered. The patient agreed with the plan and demonstrated an understanding of the instructions.   The patient was advised to call back or seek an in-person evaluation if the symptoms worsen or if the condition fails to improve as anticipated.  I provided 15 minutes of non-face-to-face time during this encounter.   Levonne Spiller, MD  [facility] MD/PA/NP OP Progress Note  11/30/2020 3:10 PM [place]  MRN:  [id number]  Chief Complaint:  Chief Complaint    Depression; Anxiety; Follow-up     HPI: Patient is 69year-oldwidowedCaucasian female who livesalonein [place]. . She is on disability for fibromyalgia arthritis and bipolar disorder.  The patient has been seeing a psychiatrist for years. Initially she was diagnosed with depression but eventually she developed some manic symptoms and now is designated as having bipolar disorder. She's often very tired due to her fibromyalgia and has chronic muscle aches and pains. She feels that the medicines she is on now been very helpful. She sleeping well and her mood is stable. She's not using drugs or alcohol denies suicidal ideation. She's never been psychotic but mentions having "breakdown" about 10 years ago. She's never been  hospitalized.  The patient returns after 2 months.  Last time I was very concerned about her weight loss.  She had gotten down to 110 lbs.  She states that she is trying to do better but she does not know the current weight.  Her son has been bringing in food for her and she is trying to eat regular meals.  She is sleeping well denies mood changes anger irritability depression or suicidal ideation.  Her anxiety is under good control. Visit Diagnosis:    ICD-10-CM   1. Bipolar 1 disorder (HCC)  F31.9 PARoxetine (PAXIL) 40 MG tablet    traZODone (DESYREL) 150 MG tablet    Past Psychiatric History: Long-term outpatient treatment  Past Medical History:  Past Medical History:  Diagnosis Date  . Arthritis   . Bipolar disorder ([place])   . Fibromyalgia   . GERD (gastroesophageal reflux disease) 12/31/2002  . Osteoarthritis   . Sinusitis 12/31/2012    Past Surgical History:  Procedure Laterality Date  . TONSILLECTOMY  04/08/57   some date around then  . TUBAL LIGATION  10/21/1984    Family Psychiatric History: see below  Family History:  Family History  Problem Relation Age of Onset  . Bipolar disorder Sister   . Anxiety disorder Sister   . Dementia Sister 92  . Paranoid behavior Sister   . Bipolar disorder Father   . Schizophrenia Father   . Alcohol abuse Maternal Uncle   . Alcohol abuse Paternal Uncle   . Anxiety disorder Mother   . ADD / ADHD Other   . ADD / ADHD Other   . Healthy Other   .  OCD Neg Hx   . Seizures Neg Hx   . Sexual abuse Neg Hx   . Physical abuse Neg Hx     Social History:  Social History   Socioeconomic History  . Marital status: Married    Spouse name: Not on file  . Number of children: Not on file  . Years of education: Not on file  . Highest education level: Not on file  Occupational History  . Not on file  Tobacco Use  . Smoking status: Current Every Day Smoker    Packs/day: 0.50    Years: 30.00    Pack years: 15.00    Types: Cigarettes  .  Smokeless tobacco: Never Used  . Tobacco comment: 11-15 cigs a day as of 05/01/2013  Substance and Sexual Activity  . Alcohol use: No  . Drug use: No  . Sexual activity: Yes    Partners: Male    Birth control/protection: Post-menopausal  Other Topics Concern  . Not on file  Social History Narrative  . Not on file   Social Determinants of Health   Financial Resource Strain:   . Difficulty of Paying Living Expenses: Not on file  Food Insecurity:   . Worried About Charity fundraiser in the Last Year: Not on file  . Ran Out of Food in the Last Year: Not on file  Transportation Needs:   . Lack of Transportation (Medical): Not on file  . Lack of Transportation (Non-Medical): Not on file  Physical Activity:   . Days of Exercise per Week: Not on file  . Minutes of Exercise per Session: Not on file  Stress:   . Feeling of Stress : Not on file  Social Connections:   . Frequency of Communication with Friends and Family: Not on file  . Frequency of Social Gatherings with Friends and Family: Not on file  . Attends Religious Services: Not on file  . Active Member of Clubs or Organizations: Not on file  . Attends Archivist Meetings: Not on file  . Marital Status: Not on file    Allergies:  Allergies  Allergen Reactions  . Codeine Itching  . Cymbalta [Duloxetine Hcl] Other (See Comments)    GERD  . Oxycodone Itching and Other (See Comments)    "OUT THERE", climbing the walls  . Baclofen Other (See Comments)    Anxiety got much worse and possibly ppted manic episode  . Betadine [Povidone Iodine] Itching    Particularly if in a douche   . Flonase [Fluticasone]     Makes eye pressure to increase  . Penicillins Swelling  . Sulfa Antibiotics Swelling  . Aspirin Hives and Rash  . Elavil [Amitriptyline] Other (See Comments)    Caused bowels to empty quickly and couldn't sleep on it.  . Latex Other (See Comments)    If in mouth her mouth swells  . Nsaids Nausea Only and  Other (See Comments)    Almost passing out, bad acid reflux    Metabolic Disorder Labs: No results found for: HGBA1C, MPG No results found for: PROLACTIN No results found for: CHOL, TRIG, HDL, CHOLHDL, VLDL, LDLCALC No results found for: TSH  Therapeutic Level Labs: No results found for: LITHIUM No results found for: VALPROATE No components found for:  CBMZ  Current Medications: Current Outpatient Medications  Medication Sig Dispense Refill  . acetaminophen (TYLENOL 8 HOUR) 650 MG CR tablet Take 650 mg by mouth at bedtime.    . ALPRAZolam (XANAX) 0.5  MG tablet Take 1 tablet (0.5 mg total) by mouth 4 (four) times daily as needed. 360 tablet 1  . Calcium Citrate-Vitamin D (CALCIUM + D PO) Take by mouth daily.    . cetirizine (ZYRTEC) 10 MG tablet Take 10 mg by mouth daily.    Marland Kitchen gabapentin (NEURONTIN) 300 MG capsule TAKE 2 CAPSULES BY MOUTH IN THE MORNING, 2 CAPSULES IN  THE AFTERNOON AND 3  CAPSULES AT NIGHT 630 capsule 2  . omeprazole (PRILOSEC) 20 MG capsule Take 20 mg by mouth daily.     Marland Kitchen PARoxetine (PAXIL) 40 MG tablet Take 1 tablet (40 mg total) by mouth every morning. 90 tablet 2  . simvastatin (ZOCOR) 20 MG tablet Take 20 mg by mouth.    . sodium chloride (OCEAN) 0.65 % SOLN nasal spray Place 1 spray into both nostrils as needed for congestion.    . TiZANidine HCl (ZANAFLEX PO) Take 4 mg by mouth 2 (two) times daily.    . traZODone (DESYREL) 150 MG tablet Take 1 tablet (150 mg total) by mouth at bedtime. 90 tablet 2   No current facility-administered medications for this visit.     Musculoskeletal: Strength & Muscle Tone: within normal limits Gait & Station: normal Patient leans: N/A  Psychiatric Specialty Exam: Review of Systems  All other systems reviewed and are negative.   There were no vitals taken for this visit.There is no height or weight on file to calculate BMI.  General Appearance: Negative  Eye Contact:  NA  Speech:  Clear and Coherent  Volume:  Normal   Mood:  Euthymic  Affect:  NA  Thought Process:  Goal Directed  Orientation:  Full (Time, Place, and Person)  Thought Content: WDL   Suicidal Thoughts:  No  Homicidal Thoughts:  No  Memory:  Immediate;   Good Recent;   Good Remote;   Good  Judgement:  Good  Insight:  Fair  Psychomotor Activity:  Normal  Concentration:  Concentration: Good and Attention Span: Good  Recall:  Good  Fund of Knowledge: Good  Language: Good  Akathisia:  No  Handed:  Right  AIMS (if indicated): not done  Assets:  Communication Skills Desire for Improvement Resilience Social Support Talents/Skills  ADL's:  Intact  Cognition: WNL  Sleep:  Good   Screenings:   Assessment and Plan: This patient is a 69 year old female with a history of bipolar disorder.  She is doing better with her eating.  She states that her mental state has been good and she denies serious depression or manic symptoms.  She will continue Xanax 0.5 mg 4 times daily as needed for anxiety, gabapentin 600 mg twice daily and 900 mg at bedtime for anxiety, Paxil 40 mg daily for depression and trazodone 150 mg at bedtime for sleep.  She will return to see me in 3 months   Levonne Spiller, MD 11/30/2020, 3:10 PM

## 2021-01-17 ENCOUNTER — Telehealth (HOSPITAL_COMMUNITY): Payer: Self-pay | Admitting: Psychiatry

## 2021-01-17 NOTE — Telephone Encounter (Signed)
Called to schedule a f/u appt left vm

## 2021-01-18 ENCOUNTER — Other Ambulatory Visit (HOSPITAL_COMMUNITY): Payer: Self-pay | Admitting: Psychiatry

## 2021-01-18 DIAGNOSIS — F319 Bipolar disorder, unspecified: Secondary | ICD-10-CM

## 2021-01-18 MED ORDER — PAROXETINE HCL 40 MG PO TABS: 40.0000 mg | ORAL_TABLET | ORAL | 2 refills | Status: DC

## 2021-01-18 MED ORDER — ALPRAZOLAM 0.5 MG PO TABS
0.5000 mg | ORAL_TABLET | Freq: Four times a day (QID) | ORAL | 1 refills | Status: DC | PRN
Start: 2021-01-18 — End: 2021-03-01

## 2021-01-18 MED ORDER — TRAZODONE HCL 150 MG PO TABS: 150.0000 mg | ORAL_TABLET | Freq: Every day | ORAL | 2 refills | Status: DC

## 2021-01-18 MED ORDER — GABAPENTIN 300 MG PO CAPS
ORAL_CAPSULE | ORAL | 2 refills | Status: DC
Start: 2021-01-18 — End: 2021-03-01

## 2021-02-28 DIAGNOSIS — R9431 Abnormal electrocardiogram [ECG] [EKG]: Secondary | ICD-10-CM

## 2021-02-28 HISTORY — DX: Abnormal electrocardiogram (ECG) (EKG): R94.31

## 2021-03-01 ENCOUNTER — Other Ambulatory Visit: Payer: Self-pay

## 2021-03-01 ENCOUNTER — Telehealth (INDEPENDENT_AMBULATORY_CARE_PROVIDER_SITE_OTHER): Payer: Medicare HMO | Admitting: Psychiatry

## 2021-03-01 ENCOUNTER — Encounter (HOSPITAL_COMMUNITY): Payer: Self-pay | Admitting: Psychiatry

## 2021-03-01 DIAGNOSIS — F319 Bipolar disorder, unspecified: Secondary | ICD-10-CM | POA: Diagnosis not present

## 2021-03-01 MED ORDER — GABAPENTIN 300 MG PO CAPS: 600.0000 mg | ORAL_CAPSULE | Freq: Two times a day (BID) | ORAL | 2 refills | Status: DC

## 2021-03-01 MED ORDER — PAROXETINE HCL 40 MG PO TABS: 40.0000 mg | ORAL_TABLET | ORAL | 2 refills | Status: DC

## 2021-03-01 MED ORDER — ALPRAZOLAM 0.5 MG PO TABS: 0.5000 mg | ORAL_TABLET | Freq: Four times a day (QID) | ORAL | 1 refills | Status: DC | PRN

## 2021-03-01 MED ORDER — MIRTAZAPINE 15 MG PO TABS: 15.0000 mg | ORAL_TABLET | Freq: Every day | ORAL | 2 refills | Status: DC

## 2021-03-01 NOTE — Progress Notes (Signed)
Virtual Visit via Telephone Note  I connected with Katherine Middleton on 03/01/21 at  1:00 PM EST by telephone and verified that I am speaking with the correct person using two identifiers.  Location: Patient: home Provider: home   I discussed the limitations, risks, security and privacy concerns of performing an evaluation and management service by telephone and the availability of in person appointments. I also discussed with the patient that there may be a patient responsible charge related to this service. The patient expressed understanding and agreed to proceed.    I discussed the assessment and treatment plan with the patient. The patient was provided an opportunity to ask questions and all were answered. The patient agreed with the plan and demonstrated an understanding of the instructions.   The patient was advised to call back or seek an in-person evaluation if the symptoms worsen or if the condition fails to improve as anticipated.  I provided 15 minutes of non-face-to-face time during this encounter.   Levonne Spiller, MD  [facility] MD/PA/NP OP Progress Note  03/01/2021 1:24 PM Katherine Middleton  MRN:  [id number]  Chief Complaint:  Chief Complaint    Anxiety; Depression; Follow-up     HPI: Patient is 70year-oldwidowedCaucasian female who livesalonein [place]. . She is on disability for fibromyalgia arthritis and bipolar disorder.  The patient has been seeing a psychiatrist for years. Initially she was diagnosed with depression but eventually she developed some manic symptoms and now is designated as having bipolar disorder. She's often very tired due to her fibromyalgia and has chronic muscle aches and pains. She feels that the medicines she is on now been very helpful. She sleeping well and her mood is stable. She's not using drugs or alcohol denies suicidal ideation. She's never been psychotic but mentions having "breakdown" about 10 years ago. She's never been  hospitalized.  The patient returns for follow-up after 3 months.  She recently saw her primary doctor because of continued weight loss.  She is now down to 101 pounds.  She is also gotten progressively weaker and has to walk with a cane.  Her PCP has ordered mirtazapine to take at bedtime instead of trazodone although the patient has not picked it up yet this should help her sleep and appetite.  She also has been instructed to drink Ensure and she has also been referred to physical therapy to improve her strength and balance.  All of her laboratories including CBC chemistry panel and thyroid are normal although she may have a little bit of anemia.  She could do a colonoscopy because of the severe weight loss but is going to have an abdominal CT scan.  Interestingly she claims that she is eating well.  A lot of the weight loss started to happen after her husband died but she states she is eating more now than when she lived with him.  She denies being depressed or anxious and she is sleeping well.  She denies any manic symptoms.  I would like to get her off some of the more sedating medicines because of risks of fall.  She is already cut back on gabapentin to 600 mg twice daily.  I have urged her to cut back on tizanidine as well and We will look at cutting down the Xanax Visit Diagnosis:    ICD-10-CM   1. Bipolar 1 disorder (HCC)  F31.9 PARoxetine (PAXIL) 40 MG tablet    Past Psychiatric History: Long term outpatient treatment  Past Medical History:  Past Medical History:  Diagnosis Date  . Arthritis   . Bipolar disorder ([place])   . Fibromyalgia   . GERD (gastroesophageal reflux disease) 12/31/2002  . Osteoarthritis   . Sinusitis 12/31/2012    Past Surgical History:  Procedure Laterality Date  . TONSILLECTOMY  04/08/57   some date around then  . TUBAL LIGATION  10/21/1984    Family Psychiatric History: see below  Family History:  Family History  Problem Relation Age of Onset  . Bipolar  disorder Sister   . Anxiety disorder Sister   . Dementia Sister 70  . Paranoid behavior Sister   . Bipolar disorder Father   . Schizophrenia Father   . Alcohol abuse Maternal Uncle   . Alcohol abuse Paternal Uncle   . Anxiety disorder Mother   . ADD / ADHD Other   . ADD / ADHD Other   . Healthy Other   . OCD Neg Hx   . Seizures Neg Hx   . Sexual abuse Neg Hx   . Physical abuse Neg Hx     Social History:  Social History   Socioeconomic History  . Marital status: Married    Spouse name: Not on file  . Number of children: Not on file  . Years of education: Not on file  . Highest education level: Not on file  Occupational History  . Not on file  Tobacco Use  . Smoking status: Current Every Day Smoker    Packs/day: 0.50    Years: 30.00    Pack years: 15.00    Types: Cigarettes  . Smokeless tobacco: Never Used  . Tobacco comment: 11-15 cigs a day as of 05/01/2013  Substance and Sexual Activity  . Alcohol use: No  . Drug use: No  . Sexual activity: Yes    Partners: Male    Birth control/protection: Post-menopausal  Other Topics Concern  . Not on file  Social History Narrative  . Not on file   Social Determinants of Health   Financial Resource Strain: Not on file  Food Insecurity: Not on file  Transportation Needs: Not on file  Physical Activity: Not on file  Stress: Not on file  Social Connections: Not on file    Allergies:  Allergies  Allergen Reactions  . Codeine Itching  . Cymbalta [Duloxetine Hcl] Other (See Comments)    GERD  . Oxycodone Itching and Other (See Comments)    "OUT THERE", climbing the walls  . Baclofen Other (See Comments)    Anxiety got much worse and possibly ppted manic episode  . Betadine [Povidone Iodine] Itching    Particularly if in a douche   . Flonase [Fluticasone]     Makes eye pressure to increase  . Penicillins Swelling  . Sulfa Antibiotics Swelling  . Aspirin Hives and Rash  . Elavil [Amitriptyline] Other (See Comments)     Caused bowels to empty quickly and couldn't sleep on it.  . Latex Other (See Comments)    If in mouth her mouth swells  . Nsaids Nausea Only and Other (See Comments)    Almost passing out, bad acid reflux    Metabolic Disorder Labs: No results found for: HGBA1C, MPG No results found for: PROLACTIN No results found for: CHOL, TRIG, HDL, CHOLHDL, VLDL, LDLCALC No results found for: TSH  Therapeutic Level Labs: No results found for: LITHIUM No results found for: VALPROATE No components found for:  CBMZ  Current Medications: Current Outpatient Medications  Medication Sig Dispense Refill  .  mirtazapine (REMERON) 15 MG tablet Take 1 tablet (15 mg total) by mouth at bedtime. 30 tablet 2  . acetaminophen (TYLENOL 8 HOUR) 650 MG CR tablet Take 650 mg by mouth at bedtime.    . ALPRAZolam (XANAX) 0.5 MG tablet Take 1 tablet (0.5 mg total) by mouth 4 (four) times daily as needed. 360 tablet 1  . Calcium Citrate-Vitamin D (CALCIUM + D PO) Take by mouth daily.    . cetirizine (ZYRTEC) 10 MG tablet Take 10 mg by mouth daily.    Marland Kitchen gabapentin (NEURONTIN) 300 MG capsule Take 2 capsules (600 mg total) by mouth 2 (two) times daily. 480 capsule 2  . omeprazole (PRILOSEC) 20 MG capsule Take 20 mg by mouth daily.     Marland Kitchen PARoxetine (PAXIL) 40 MG tablet Take 1 tablet (40 mg total) by mouth every morning. 90 tablet 2  . simvastatin (ZOCOR) 20 MG tablet Take 20 mg by mouth.    . sodium chloride (OCEAN) 0.65 % SOLN nasal spray Place 1 spray into both nostrils as needed for congestion.    . TiZANidine HCl (ZANAFLEX PO) Take 4 mg by mouth 2 (two) times daily.     No current facility-administered medications for this visit.     Musculoskeletal: Strength & Muscle Tone: decreased Gait & Station: unsteady Patient leans: N/A  Psychiatric Specialty Exam: Review of Systems  Constitutional: Positive for unexpected weight change.  Musculoskeletal: Positive for gait problem.    There were no vitals taken  for this visit.There is no height or weight on file to calculate BMI.  General Appearance: NA  Eye Contact:  NA  Speech:  Clear and Coherent  Volume:  Normal  Mood:  Euthymic  Affect:  Appropriate and Congruent  Thought Process:  Goal Directed  Orientation:  Full (Time, Place, and Person)  Thought Content: WDL   Suicidal Thoughts:  No  Homicidal Thoughts:  No  Memory:  Immediate;   Good Recent;   Good Remote;   Good  Judgement:  Good  Insight:  Fair  Psychomotor Activity:  Decreased  Concentration:  Concentration: Good and Attention Span: Good  Recall:  Good  Fund of Knowledge: Fair  Language: Good  Akathisia:  No  Handed:  Right  AIMS (if indicated): not done  Assets:  Communication Skills Desire for Improvement Resilience Social Support Talents/Skills  ADL's:  Intact  Cognition: WNL  Sleep:  Good   Screenings: PHQ2-9   Flowsheet Row Video Visit from 03/01/2021 in [facility]  PHQ-2 Total Score 0       Assessment and Plan: This patient is a 70 year old female with a history of bipolar disorder at least presumably.  To my knowledge she has had more symptoms of depression and anxiety.  She is still losing weight and her primary physician is trying to get to the root cause of this.  I agree with adding mirtazapine at night at the 50 mg dose.  She will continue Xanax 0.5 mg 4 times daily only as needed for anxiety gabapentin 60 mg twice daily for anxiety and fibromyalgia, Paxil 40 mg daily for depression.  She will return to see me in 2 months.   Levonne Spiller, MD 03/01/2021, 1:24 PM

## 2021-03-13 ENCOUNTER — Ambulatory Visit (HOSPITAL_COMMUNITY): Payer: Medicare HMO | Admitting: Physical Therapy

## 2021-03-13 ENCOUNTER — Other Ambulatory Visit (HOSPITAL_COMMUNITY): Payer: Self-pay | Admitting: Family Medicine

## 2021-03-13 DIAGNOSIS — R29898 Other symptoms and signs involving the musculoskeletal system: Secondary | ICD-10-CM

## 2021-03-13 DIAGNOSIS — R634 Abnormal weight loss: Secondary | ICD-10-CM

## 2021-03-16 ENCOUNTER — Other Ambulatory Visit (HOSPITAL_COMMUNITY): Payer: Medicare HMO

## 2021-03-22 ENCOUNTER — Emergency Department (HOSPITAL_COMMUNITY)
Admission: EM | Admit: 2021-03-22 | Discharge: 2021-03-22 | Disposition: A | Discharge status: Home / Self Care | Payer: Medicare HMO | Attending: Emergency Medicine | Admitting: Emergency Medicine

## 2021-03-22 ENCOUNTER — Encounter (HOSPITAL_COMMUNITY): Payer: Self-pay

## 2021-03-22 ENCOUNTER — Other Ambulatory Visit: Payer: Self-pay

## 2021-03-22 ENCOUNTER — Ambulatory Visit (HOSPITAL_COMMUNITY): Payer: Medicare HMO

## 2021-03-22 ENCOUNTER — Emergency Department (HOSPITAL_COMMUNITY): Payer: Medicare HMO

## 2021-03-22 DIAGNOSIS — Z91040 Latex allergy status: Secondary | ICD-10-CM | POA: Insufficient documentation

## 2021-03-22 DIAGNOSIS — R202 Paresthesia of skin: Secondary | ICD-10-CM | POA: Diagnosis not present

## 2021-03-22 DIAGNOSIS — M549 Dorsalgia, unspecified: Secondary | ICD-10-CM | POA: Diagnosis not present

## 2021-03-22 DIAGNOSIS — G8929 Other chronic pain: Secondary | ICD-10-CM | POA: Diagnosis not present

## 2021-03-22 DIAGNOSIS — R410 Disorientation, unspecified: Secondary | ICD-10-CM | POA: Insufficient documentation

## 2021-03-22 DIAGNOSIS — R55 Syncope and collapse: Secondary | ICD-10-CM | POA: Insufficient documentation

## 2021-03-22 DIAGNOSIS — R479 Unspecified speech disturbances: Secondary | ICD-10-CM | POA: Insufficient documentation

## 2021-03-22 DIAGNOSIS — R4182 Altered mental status, unspecified: Secondary | ICD-10-CM

## 2021-03-22 DIAGNOSIS — F17210 Nicotine dependence, cigarettes, uncomplicated: Secondary | ICD-10-CM | POA: Insufficient documentation

## 2021-03-22 LAB — DIFFERENTIAL
Abs Immature Granulocytes: 0.04 10*3/uL (ref 0.00–0.07)
Basophils Absolute: 0 10*3/uL (ref 0.0–0.1)
Basophils Relative: 0 %
Eosinophils Absolute: 0 10*3/uL (ref 0.0–0.5)
Eosinophils Relative: 0 %
Immature Granulocytes: 1 %
Lymphocytes Relative: 8 %
Lymphs Abs: 0.6 10*3/uL — ABNORMAL LOW (ref 0.7–4.0)
Monocytes Absolute: 0.3 10*3/uL (ref 0.1–1.0)
Monocytes Relative: 3 %
Neutro Abs: 7.1 10*3/uL (ref 1.7–7.7)
Neutrophils Relative %: 88 %

## 2021-03-22 LAB — CBC
HCT: 41.4 % (ref 36.0–46.0)
Hemoglobin: 13.9 g/dL (ref 12.0–15.0)
MCH: 34.5 pg — ABNORMAL HIGH (ref 26.0–34.0)
MCHC: 33.6 g/dL (ref 30.0–36.0)
MCV: 102.7 fL — ABNORMAL HIGH (ref 80.0–100.0)
Platelets: 428 10*3/uL — ABNORMAL HIGH (ref 150–400)
RBC: 4.03 MIL/uL (ref 3.87–5.11)
RDW: 13.6 % (ref 11.5–15.5)
WBC: 8 10*3/uL (ref 4.0–10.5)
nRBC: 0 % (ref 0.0–0.2)

## 2021-03-22 LAB — COMPREHENSIVE METABOLIC PANEL
ALT: 12 U/L (ref 0–44)
AST: 30 U/L (ref 15–41)
Albumin: 3.2 g/dL — ABNORMAL LOW (ref 3.5–5.0)
Alkaline Phosphatase: 100 U/L (ref 38–126)
Anion gap: 7 (ref 5–15)
BUN: <5 mg/dL — ABNORMAL LOW (ref 8–23)
CO2: 30 mmol/L (ref 22–32)
Calcium: 9.9 mg/dL (ref 8.9–10.3)
Chloride: 102 mmol/L (ref 98–111)
Creatinine, Ser: 0.66 mg/dL (ref 0.44–1.00)
GFR, Estimated: >60 mL/min (ref >60)
Glucose, Bld: 152 mg/dL — ABNORMAL HIGH (ref 70–99)
Potassium: 4.3 mmol/L (ref 3.5–5.1)
Sodium: 139 mmol/L (ref 135–145)
Total Bilirubin: 0.6 mg/dL (ref 0.3–1.2)
Total Protein: 6.4 g/dL — ABNORMAL LOW (ref 6.5–8.1)

## 2021-03-22 LAB — I-STAT CHEM 8, ED
BUN: 5 mg/dL — ABNORMAL LOW (ref 8–23)
Calcium, Ion: 1.2 mmol/L (ref 1.15–1.40)
Chloride: 102 mmol/L (ref 98–111)
Creatinine, Ser: 0.4 mg/dL — ABNORMAL LOW (ref 0.44–1.00)
Glucose, Bld: 146 mg/dL — ABNORMAL HIGH (ref 70–99)
HCT: 43 % (ref 36.0–46.0)
Hemoglobin: 14.6 g/dL (ref 12.0–15.0)
Potassium: 4.5 mmol/L (ref 3.5–5.1)
Sodium: 138 mmol/L (ref 135–145)
TCO2: 31 mmol/L (ref 22–32)

## 2021-03-22 LAB — CBG MONITORING, ED: Glucose-Capillary: 128 mg/dL — ABNORMAL HIGH (ref 70–99)

## 2021-03-22 LAB — PROTIME-INR
INR: 0.9 (ref 0.8–1.2)
Prothrombin Time: 11.4 seconds (ref 11.4–15.2)

## 2021-03-22 LAB — APTT: aPTT: 32 seconds (ref 24–36)

## 2021-03-22 MED ORDER — SODIUM CHLORIDE 0.9% FLUSH: 3.0000 mL | Freq: Once | INTRAVENOUS | Status: DC

## 2021-03-22 MED ORDER — LORAZEPAM 2 MG/ML IJ SOLN
0.5000 mg | Freq: Once | INTRAMUSCULAR | Status: AC
  Administered 2021-03-22: 0.5 mg via INTRAVENOUS
  Filled 2021-03-22: qty 1

## 2021-03-22 NOTE — Discharge Instructions (Addendum)
You have been seen and discharged from the emergency department.  You were evaluated for change in mental status.  Your MRI showed no signs of stroke.  This could be related to medications.  Follow-up with your primary provider for reevaluation, medication adjustment and further care.  Follow-up as an outpatient with neurology for further evaluation. If you have any worsening symptoms, facial droop, vision changes, numbness or weakness in an arm/leg, or further concerns for health please return to an emergency department for further evaluation.

## 2021-03-22 NOTE — ED Notes (Signed)
Pt is ambulatory to the bathroom with a steady gait. She is able to identify herself and date of birth, but not where she is at the moment.

## 2021-03-22 NOTE — ED Provider Notes (Signed)
[facility] DEPARTMENT Provider Note   CSN: [id number] Arrival date & time: 03/22/21  1553     History No chief complaint on file.   Katherine Middleton is a 70 y.o. female.  HPI   70 year old female with past medical history of fibromyalgia, bipolar disorder, GERD presents the emergency department with confusion and change in speech.  The son is bedside.  The patient lives home alone independent.  He last spoke to her before 5 PM yesterday.  He states that her speech sounded normal and she was in her baseline health.  This morning he went to pick her up around 11 AM for a CAT scan and noticed that she seemed "off".  She was hesitating when speaking and taking a long time to answer questions.  He had to run to work but when he came back to pick her up for doctor's appointment he states that the patient walked out of the house wearing only her coat.  When he brought the patient in side she was still very slow to answer questions and at times would not answer at all.  There was never noted facial droop or unilateral weakness.  Since arriving the son states that her symptoms have improved.  The patient is alert and oriented, seems to have appropriate answers, does at times hesitate but is following commands.  She is complaining of chronic back pain and numbness to the fourth and fifth finger on the left but no other acute complaints.  They state that she has been compliant with her medication, no possible overmedication.  Past Medical History:  Diagnosis Date  . Arthritis   . Bipolar disorder ([place])   . Fibromyalgia   . GERD (gastroesophageal reflux disease) 12/31/2002  . Osteoarthritis   . Sinusitis 12/31/2012    Patient Active Problem List   Diagnosis Date Noted  . Back pain, chronic 04/03/2013  . Anxiety 12/17/2012  . Vitamin D insufficiency 12/17/2012  . GERD (gastroesophageal reflux disease) 11/18/2012  . Bipolar 1 disorder ([place]) 11/18/2012  . Fibromyalgia  11/18/2012    Past Surgical History:  Procedure Laterality Date  . TONSILLECTOMY  04/08/57   some date around then  . TUBAL LIGATION  10/21/1984     OB History   No obstetric history on file.     Family History  Problem Relation Age of Onset  . Bipolar disorder Sister   . Anxiety disorder Sister   . Dementia Sister 46  . Paranoid behavior Sister   . Bipolar disorder Father   . Schizophrenia Father   . Alcohol abuse Maternal Uncle   . Alcohol abuse Paternal Uncle   . Anxiety disorder Mother   . ADD / ADHD Other   . ADD / ADHD Other   . Healthy Other   . OCD Neg Hx   . Seizures Neg Hx   . Sexual abuse Neg Hx   . Physical abuse Neg Hx     Social History   Tobacco Use  . Smoking status: Current Every Day Smoker    Packs/day: 0.50    Years: 30.00    Pack years: 15.00    Types: Cigarettes  . Smokeless tobacco: Never Used  . Tobacco comment: 11-15 cigs a day as of 05/01/2013  Substance Use Topics  . Alcohol use: No  . Drug use: No    Home Medications Prior to Admission medications   Medication Sig Start Date End Date Taking? Authorizing Provider  acetaminophen (TYLENOL 8  HOUR) 650 MG CR tablet Take 650 mg by mouth at bedtime.    [provider]  ALPRAZolam Duanne Moron) 0.5 MG tablet Take 1 tablet (0.5 mg total) by mouth 4 (four) times daily as needed. 03/01/21   Cloria Spring, MD  Calcium Citrate-Vitamin D (CALCIUM + D PO) Take by mouth daily.    [provider]  cetirizine (ZYRTEC) 10 MG tablet Take 10 mg by mouth daily.    [provider]  gabapentin (NEURONTIN) 300 MG capsule Take 2 capsules (600 mg total) by mouth 2 (two) times daily. 03/01/21   Cloria Spring, MD  mirtazapine (REMERON) 15 MG tablet Take 1 tablet (15 mg total) by mouth at bedtime. 03/01/21   Cloria Spring, MD  omeprazole (PRILOSEC) 20 MG capsule Take 20 mg by mouth daily.  09/04/13   [provider]  PARoxetine (PAXIL) 40 MG tablet Take 1 tablet (40 mg total) by mouth  every morning. 03/01/21   Cloria Spring, MD  simvastatin (ZOCOR) 20 MG tablet Take 20 mg by mouth. 05/28/13   Aletha Halim., PA-C  sodium chloride (OCEAN) 0.65 % SOLN nasal spray Place 1 spray into both nostrils as needed for congestion.    [provider]  TiZANidine HCl (ZANAFLEX PO) Take 4 mg by mouth 2 (two) times daily.    [provider]    Allergies    Codeine, Cymbalta [duloxetine hcl], Oxycodone, Baclofen, Betadine [povidone iodine], Flonase [fluticasone], Penicillins, Sulfa antibiotics, Aspirin, Elavil [amitriptyline], Latex, and Nsaids  Review of Systems   Review of Systems  Constitutional: Negative for chills and fever.  HENT: Negative for congestion.   Eyes: Negative for visual disturbance.  Respiratory: Negative for shortness of breath.   Cardiovascular: Negative for chest pain.  Gastrointestinal: Negative for abdominal pain, diarrhea and vomiting.  Genitourinary: Negative for dysuria.  Musculoskeletal: Positive for back pain.  Skin: Negative for rash.  Neurological: Positive for syncope and speech difficulty. Negative for seizures, facial asymmetry, weakness and headaches.    Physical Exam Updated Vital Signs BP (!) 136/53   Pulse 67   Temp 98.3 F (36.8 C) (Oral)   Resp (!) 21   SpO2 98%   Physical Exam Vitals and nursing note reviewed.  Constitutional:      Appearance: Normal appearance.  HENT:     Head: Normocephalic.     Mouth/Throat:     Mouth: Mucous membranes are moist.  Cardiovascular:     Rate and Rhythm: Normal rate.  Pulmonary:     Effort: Pulmonary effort is normal. No respiratory distress.  Abdominal:     Palpations: Abdomen is soft.     Tenderness: There is no abdominal tenderness.  Skin:    General: Skin is warm.  Neurological:     Mental Status: She is alert and oriented to person, place, and time.     Comments: Still hesitates with some questions but is almost back to baseline, no focal weakness or sensory  change, NIH of 0 at this time  Psychiatric:        Mood and Affect: Mood normal.     ED Results / Procedures / Treatments   Labs (all labs ordered are listed, but only abnormal results are displayed) Labs Reviewed  CBC - Abnormal; Notable for the following components:      Result Value   MCV 102.7 (*)    MCH 34.5 (*)    Platelets 428 (*)    All other components within normal  limits  DIFFERENTIAL - Abnormal; Notable for the following components:   Lymphs Abs 0.6 (*)    All other components within normal limits  COMPREHENSIVE METABOLIC PANEL - Abnormal; Notable for the following components:   Glucose, Bld 152 (*)    BUN <5 (*)    Total Protein 6.4 (*)    Albumin 3.2 (*)    All other components within normal limits  I-STAT CHEM 8, ED - Abnormal; Notable for the following components:   BUN 5 (*)    Creatinine, Ser 0.40 (*)    Glucose, Bld 146 (*)    All other components within normal limits  CBG MONITORING, ED - Abnormal; Notable for the following components:   Glucose-Capillary 128 (*)    All other components within normal limits  PROTIME-INR  APTT    EKG EKG Interpretation  Date/Time:  Wednesday March 22 2021 16:09:51 EDT Ventricular Rate:  69 PR Interval:  154 QRS Duration: 70 QT Interval:  570 QTC Calculation: 610 R Axis:   44 Text Interpretation: Normal sinus rhythm with sinus arrhythmia Nonspecific ST and T wave abnormality Prolonged QT Abnormal ECG NSR, wavering baseline Confirmed by Lavenia Atlas [phone]) on 03/22/2021 5:20:52 PM   Radiology No results found.  Procedures Procedures   Medications Ordered in ED Medications  sodium chloride flush (NS) 0.9 % injection 3 mL (3 mLs Intravenous Not Given 03/22/21 1711)    ED Course  I have reviewed the triage vital signs and the nursing notes.  Pertinent labs & imaging results that were available during my care of the patient were reviewed by me and considered in my medical decision making (see chart for  details).    MDM Rules/Calculators/A&P                          70 year old female presents the emergency department accompanied by her son for concern of slow speech and confusion.  Last known normal was 24 hours ago.  Patient is an NIH of 0, no aphasia or slurred speech but does at times hesitate to answer or miss answering questions.  She follows commands.  Will evaluate for metabolic cause and an MRI of the brain.  MRI is negative for acute stroke.  On reevaluation patient symptoms have resolved.  She has now fluent, conversational, offers no new complaints.  She states before when she had slow and at times confused speech that she felt "generally unwell" but did not have any headache or any other acute symptoms.  She now feels back to baseline.  There is concern for polypharmacy, she is on multiple medications that would mixed could cause symptoms similar to this.  This does not appear to be stroke related.  She had no genuine aphasia or slurred speech, mostly confusion.  She is a low ABCD2 score.  Consulted with neurology, Dr. Rory Percy agrees that this can be further evaluated as an outpatient, especially now that she is back to baseline.  We recommend that she follows up with neurology as an outpatient her primary doctor for medication adjustment.  Son is at bedside who agrees with discharge plan, is comfortable with taking her home.  Patient will be discharged and treated as an outpatient.  Discharge plan and strict return to ED precautions discussed, patient verbalizes understanding and agreement.  Final Clinical Impression(s) / ED Diagnoses Final diagnoses:  None    Rx / DC Orders ED Discharge Orders    None  Lorelle Gibbs, DO 03/22/21 2348

## 2021-03-22 NOTE — ED Notes (Signed)
Pt in MRI.

## 2021-03-22 NOTE — ED Triage Notes (Signed)
Patient reports that she was sent by her doctor to be evaluated for a stroke. Patient complains of dizziness since yesterday. Patient slow to answer questions but alert. Son reports that patient was to have CT abdomen today due to weight loss the past few months. Patient with equal grips and moving all 4 extremities. Son states that patient came out her home today with a coat on and no clothing.

## 2021-03-22 NOTE — ED Notes (Signed)
Pt returned from MRI, family at bedside

## 2021-03-22 NOTE — ED Notes (Signed)
Patient transported to CT 

## 2021-03-27 ENCOUNTER — Encounter: Payer: Self-pay | Admitting: Neurology

## 2021-03-29 ENCOUNTER — Ambulatory Visit (HOSPITAL_COMMUNITY): Payer: Medicare HMO | Attending: Family Medicine | Admitting: Physical Therapy

## 2021-04-12 ENCOUNTER — Encounter (HOSPITAL_COMMUNITY): Payer: Self-pay | Admitting: Physical Therapy

## 2021-04-12 ENCOUNTER — Other Ambulatory Visit: Payer: Self-pay

## 2021-04-12 ENCOUNTER — Ambulatory Visit (HOSPITAL_COMMUNITY): Payer: Medicare HMO | Attending: Family Medicine | Admitting: Physical Therapy

## 2021-04-12 DIAGNOSIS — R262 Difficulty in walking, not elsewhere classified: Secondary | ICD-10-CM | POA: Diagnosis not present

## 2021-04-12 DIAGNOSIS — M6281 Muscle weakness (generalized): Secondary | ICD-10-CM

## 2021-04-12 DIAGNOSIS — Z9181 History of falling: Secondary | ICD-10-CM

## 2021-04-12 NOTE — Therapy (Signed)
[facility] [place], [place], [address] Phone: [phone]   Fax:  [phone]  Physical Therapy Evaluation  Patient Details  Name: Katherine Middleton MRN: [id number] Date of Birth: 1951/09/15 Referring Provider (PT): Bing Matter   Encounter Date: 04/12/2021   PT End of Session - 04/12/21 1522    Visit Number 1    Number of Visits 12    Date for PT Re-Evaluation 05/24/21    Authorization Type [employer] : Josem Kaufmann put in for 12 visits    Progress Note Due on Visit 10    PT Start Time 1440    PT Stop Time 1520    PT Time Calculation (min) 40 min    Behavior During Therapy [facility] for tasks assessed/performed           Past Medical History:  Diagnosis Date  . Arthritis   . Bipolar disorder ([place])   . Fibromyalgia   . GERD (gastroesophageal reflux disease) 12/31/2002  . Osteoarthritis   . Sinusitis 12/31/2012    Past Surgical History:  Procedure Laterality Date  . TONSILLECTOMY  04/08/57   some date around then  . TUBAL LIGATION  10/21/1984    There were no vitals filed for this visit.    Subjective Assessment - 04/12/21 1448    Subjective Ms. Holcom states that she has lost a lot of weight, she is not sure why, but she is now very weak.  She lives alone and has noticed that she is more unsteady on her feet. In the house she uses a rollator and outside she uses a quad cane.  She started using the rollator after she started falling.    Pertinent History fibromyalgia, bipolar, polypharmacy    Limitations Walking;House hold activities;Standing;Lifting    How long can you sit comfortably? no problem    How long can you stand comfortably? tested 2:25;    How long can you walk comfortably? Uses a rollator the most she walks is in her home to the kitchen and back to her bedroom.    Patient Stated Goals to be more stable    Currently in Pain? Yes    Pain Score 6     Pain Location Back    Pain Orientation Lower    Pain Descriptors  / Indicators Aching    Pain Type Chronic pain   we will not be addressing this   Pain Onset More than a month ago    Pain Frequency Constant    Aggravating Factors  standing /walkind    Pain Relieving Factors sitting    Effect of Pain on Daily Activities limits              [facility] PT Assessment - 04/12/21 0001      Assessment   Medical Diagnosis History of falls    Referring Provider (PT) Bing Matter    Onset Date/Surgical Date 04/26/21    Next MD Visit not scheduled    Prior Therapy none      Precautions   Precautions Fall      Restrictions   Weight Bearing Restrictions No      Balance Screen   Has the patient fallen in the past 6 months --   multiple unable to state exactlty how many   Has the patient had a decrease in activity level because of a fear of falling?  Yes    Is the patient reluctant to leave their home because of a fear  of falling?  Yes      Prior Function   Level of Independence Independent with household mobility with device    Vocation Retired    Leisure watch TV, use to make [employer]   Overall Cognitive Status Within Functional Limits for tasks assessed      Functional Tests   Functional tests Single leg stance;Sit to Stand      Single Leg Stance   Comments Rt: 5" Lt : 4 "      Sit to Stand   Comments must use hands :  5 in 30 seconds      ROM / Strength   AROM / PROM / Strength Strength;AROM      Strength   Strength Assessment Site Hip;Knee;Ankle    Right/Left Hip Right;Left    Right Hip Flexion 4-/5    Right Hip Extension 3/5    Right Hip ABduction 3+/5    Left Hip Flexion 3/5    Left Hip Extension 3/5    Left Hip ABduction 4/5    Right/Left Knee Right;Left    Right Knee Extension 4+/5    Left Knee Extension 4+/5    Right/Left Ankle Right;Left    Right Ankle Dorsiflexion 3/5    Left Ankle Dorsiflexion 3/5      Ambulation/Gait   Ambulation Distance (Feet) 113 Feet    Assistive device Small based quad cane     Gait Comments 2 minute walk test with one loss of balance                      Objective measurements completed on examination: See above findings.       [facility] PT Treatment/Exercise - 04/12/21 0001      Exercises   Exercises Knee/Hip      Knee/Hip Exercises: Standing   SLS x 3 B      Knee/Hip Exercises: Seated   Sit to Sand 10 reps      Knee/Hip Exercises: Supine   Bridges 5 reps    Straight Leg Raises Right;Left;5 reps      Knee/Hip Exercises: Sidelying   Hip ABduction Both;5 reps                  PT Education - 04/12/21 1521    Education Details HEP    Person(s) Educated Patient    Methods Explanation;Handout;Verbal cues    Comprehension Verbalized understanding;Returned demonstration            PT Short Term Goals - 04/12/21 1530      PT SHORT TERM GOAL #1   Title PT to be I in a HEp to improve balance and strength to be able to state that she has not fallen in the past 2 weeks.    Time 3    Period Weeks    Status New    Target Date 05/03/21      PT SHORT TERM GOAL #2   Title Pt to increase in core and LE strength as evident by being able to come sit to stand 8 x in 30 seconds    Time 3    Period Weeks    Status New      PT SHORT TERM GOAL #3   Title Balance to improve to be able to ambulate with single point cane 210 ft in a 2 mintues period of time    Time 3    Period Weeks  Status New             PT Long Term Goals - 04/12/21 1533      PT LONG TERM GOAL #1   Title Pt to be I in an advance HEP to be able to clean her house for 15 mintues without having to rest    Time 6    Period Weeks    Status New    Target Date 05/24/21      PT LONG TERM GOAL #2   Title Pt to be able to single leg stance for 15 seconds on each leg to reduce risk of falling    Time 6    Period Weeks    Status New      PT LONG TERM GOAL #3   Title PT to be able to ambulate 250' in 2 mintues to demonstrate improved balance an functional  activity tolerance.    Time 6    Period Weeks    Status New      PT LONG TERM GOAL #4   Title PT core and LE strength increased one grade to allow pt to be able to come sit to stand from couches without use of UE assist    Time 6    Period Weeks    Status New                  Plan - 04/12/21 1523    Clinical Impression Statement Ms. Gotschall is a 70 yo female who states that she has been losing weight for some unknown reason for the past six months.  Along with the weight loss she has noted that she is becoming weaker and had started falling therefore she started using her late husbands rollator.    Personal Factors and Comorbidities Fitness;Past/Current Experience;Time since onset of injury/illness/exacerbation;Social Background;Comorbidity 2    Comorbidities fibromyalgia, frequent falls, bipolar,    Examination-Activity Limitations Locomotion Level;Reach Overhead;Lift;Stairs;Stand    Examination-Participation Restrictions Cleaning;Community Activity;Occupation;Shop    Stability/Clinical Decision Making Evolving/Moderate complexity    Clinical Decision Making Moderate    Rehab Potential Good    PT Frequency 2x / week    PT Duration 6 weeks    PT Treatment/Interventions Patient/family education;Gait training;Stair training;Functional mobility training;Therapeutic activities;Therapeutic exercise;Balance training;Manual techniques    PT Next Visit Plan begin heel/toe  raises, functional squat, step ups , side stepping progress strength and balance as able    PT Home Exercise Plan bridge; all 4 SLR    Consulted and Agree with Plan of Care Patient           Patient will benefit from skilled therapeutic intervention in order to improve the following deficits and impairments:  Pain,Abnormal gait,Decreased activity tolerance,Decreased balance,Decreased strength,Difficulty walking  Visit Diagnosis: Difficulty in walking, not elsewhere classified - Plan: PT plan of care  cert/re-cert  History of falling - Plan: PT plan of care cert/re-cert  Muscle weakness (generalized) - Plan: PT plan of care cert/re-cert     Problem List Patient Active Problem List   Diagnosis Date Noted  . Back pain, chronic 04/03/2013  . Anxiety 12/17/2012  . Vitamin D insufficiency 12/17/2012  . GERD (gastroesophageal reflux disease) 11/18/2012  . Bipolar 1 disorder ([place]) 11/18/2012  . Fibromyalgia 11/18/2012   Rayetta Humphrey, PT CLT [phone] 04/12/2021, 3:39 PM  [facility] [address] [place], [place], [address] Phone: [phone]   Fax:  [phone]  Name: Coila Wardell MRN: [id number] Date of  Birth: 12/13/1951

## 2021-04-18 ENCOUNTER — Encounter (HOSPITAL_COMMUNITY): Payer: Self-pay

## 2021-04-18 ENCOUNTER — Telehealth (HOSPITAL_COMMUNITY): Payer: Self-pay

## 2021-04-18 ENCOUNTER — Other Ambulatory Visit: Payer: Self-pay

## 2021-04-18 ENCOUNTER — Ambulatory Visit (HOSPITAL_COMMUNITY): Payer: Medicare HMO

## 2021-04-18 DIAGNOSIS — Z9181 History of falling: Secondary | ICD-10-CM

## 2021-04-18 DIAGNOSIS — M6281 Muscle weakness (generalized): Secondary | ICD-10-CM

## 2021-04-18 DIAGNOSIS — R262 Difficulty in walking, not elsewhere classified: Secondary | ICD-10-CM

## 2021-04-18 NOTE — Telephone Encounter (Signed)
No show, called and left message concerning missed apt today.  Reminded next apt date and time wiht contact number included if needs to cancel or reschedule further apts.  Ihor Austin, LPTA/CLT; Delana Meyer [phone]

## 2021-04-18 NOTE — Patient Instructions (Signed)
Functional Quadriceps: Sit to Stand    Sit on edge of chair, feet flat on floor. Stand upright, extending knees fully. Repeat 10 times per set. Do 1-2 sets per session. http://orth.exer.us/735   Copyright  VHI. All rights reserved.

## 2021-04-18 NOTE — Therapy (Signed)
[facility] [address] [place], [place], [address] Phone: [phone]   Fax:  [phone]  Physical Therapy Treatment  Patient Details  Name: Katherine Middleton MRN: [id number] Date of Birth: 12-Mar-1951 Referring Provider (PT): Bing Matter   Encounter Date: 04/18/2021   PT End of Session - 04/18/21 1624    Visit Number 2    Number of Visits 12    Date for PT Re-Evaluation 05/24/21    Authorization Type [employer] : Josem Kaufmann put in for 12 visits    Progress Note Due on Visit 10    PT Start Time 1610   pt late for apt   PT Stop Time 1648    PT Time Calculation (min) 38 min    Equipment Utilized During Treatment Gait belt    Activity Tolerance Patient limited by fatigue;Patient tolerated treatment well    Behavior During Therapy [facility] for tasks assessed/performed           Past Medical History:  Diagnosis Date  . Arthritis   . Bipolar disorder ([place])   . Fibromyalgia   . GERD (gastroesophageal reflux disease) 12/31/2002  . Osteoarthritis   . Sinusitis 12/31/2012    Past Surgical History:  Procedure Laterality Date  . TONSILLECTOMY  04/08/57   some date around then  . TUBAL LIGATION  10/21/1984    There were no vitals filed for this visit.   Subjective Assessment - 04/18/21 1611    Subjective Pt late for apt today, reports she is tired upon arrival.  No reoprts of recent fall since last apt.  Reports she has intermittent LBP, pain scale 8/10 dull pain.    Pertinent History fibromyalgia, bipolar, polypharmacy    Patient Stated Goals to be more stable    Currently in Pain? Yes    Pain Score 8     Pain Location Back    Pain Orientation Lower    Pain Descriptors / Indicators Aching;Dull    Pain Type Chronic pain    Pain Onset More than a month ago    Pain Frequency Constant    Aggravating Factors  stanidng, walking    Pain Relieving Factors sitting, pain meds    Effect of Pain on Daily Activities limits                              [facility] Adult PT Treatment/Exercise - 04/18/21 0001      Exercises   Exercises Knee/Hip      Knee/Hip Exercises: Standing   Heel Raises Both;3 seconds;10 reps    Heel Raises Limitations Toe raises    Functional Squat 10 reps    Functional Squat Limitations front of chair, cueing for mechanics    Other Standing Knee Exercises sidestep 62ft both direction      Knee/Hip Exercises: Seated   Sit to Sand 5 reps;without UE support   eccentric control; 2 sets                 PT Education - 04/18/21 1618    Education Details Reviewed goals, educated importance of HEP compliance for maximal benefits, pt able to recall exercises though admits to not being compliance-    Person(s) Educated Patient    Methods Explanation    Comprehension Verbalized understanding            PT Short Term Goals - 04/12/21 1530      PT SHORT TERM GOAL #  1   Title PT to be I in a HEp to improve balance and strength to be able to state that she has not fallen in the past 2 weeks.    Time 3    Period Weeks    Status New    Target Date 05/03/21      PT SHORT TERM GOAL #2   Title Pt to increase in core and LE strength as evident by being able to come sit to stand 8 x in 30 seconds    Time 3    Period Weeks    Status New      PT SHORT TERM GOAL #3   Title Balance to improve to be able to ambulate with single point cane 210 ft in a 2 mintues period of time    Time 3    Period Weeks    Status New             PT Long Term Goals - 04/12/21 1533      PT LONG TERM GOAL #1   Title Pt to be I in an advance HEP to be able to clean her house for 15 mintues without having to rest    Time 6    Period Weeks    Status New    Target Date 05/24/21      PT LONG TERM GOAL #2   Title Pt to be able to single leg stance for 15 seconds on each leg to reduce risk of falling    Time 6    Period Weeks    Status New      PT LONG TERM GOAL #3   Title PT to be able to  ambulate 250' in 2 mintues to demonstrate improved balance an functional activity tolerance.    Time 6    Period Weeks    Status New      PT LONG TERM GOAL #4   Title PT core and LE strength increased one grade to allow pt to be able to come sit to stand from couches without use of UE assist    Time 6    Period Weeks    Status New                 Plan - 04/18/21 1650    Clinical Impression Statement Pt late for apt, reviewed importance of arriving on time for maximal benefits with therapy.  Reviewed goals and educated importance of HEP compliance for maximal benefits, pt admits to not beginning exercises though does recall what she is supposed to do at home.  Session focus on LE strengthening and balance training.  Pt limited wiht fatigue with activiites and required periodic short duration seated rest breaks.  Added STS to HEP, encouraged to complete on regular basis with verbalized understanding.    Personal Factors and Comorbidities Fitness;Past/Current Experience;Time since onset of injury/illness/exacerbation;Social Background;Comorbidity 2    Comorbidities fibromyalgia, frequent falls, bipolar,    Examination-Activity Limitations Locomotion Level;Reach Overhead;Lift;Stairs;Stand    Examination-Participation Restrictions Cleaning;Community Activity;Occupation;Shop    Stability/Clinical Decision Making Evolving/Moderate complexity    Clinical Decision Making Moderate    Rehab Potential Good    PT Frequency 2x / week    PT Duration 6 weeks    PT Treatment/Interventions Patient/family education;Gait training;Stair training;Functional mobility training;Therapeutic activities;Therapeutic exercise;Balance training;Manual techniques    PT Next Visit Plan Continue heel/toe  raises, functional squat, step ups , side stepping progress strength and balance as able    PT  Home Exercise Plan bridge; all 4 SLR; 4/19: STS    Consulted and Agree with Plan of Care Patient           Patient  will benefit from skilled therapeutic intervention in order to improve the following deficits and impairments:  Pain,Abnormal gait,Decreased activity tolerance,Decreased balance,Decreased strength,Difficulty walking  Visit Diagnosis: Difficulty in walking, not elsewhere classified  History of falling  Muscle weakness (generalized)     Problem List Patient Active Problem List   Diagnosis Date Noted  . Back pain, chronic 04/03/2013  . Anxiety 12/17/2012  . Vitamin D insufficiency 12/17/2012  . GERD (gastroesophageal reflux disease) 11/18/2012  . Bipolar 1 disorder ([place]) 11/18/2012  . Fibromyalgia 11/18/2012   Ihor Austin, LPTA/CLT; CBIS [phone]  Aldona Lento 04/18/2021, 4:54 PM  [facility] [address] [place], [place], [address] Phone: [phone]   Fax:  [phone]  Name: Katherine Middleton MRN: [id number] Date of Birth: 06-06-1951

## 2021-04-20 ENCOUNTER — Ambulatory Visit (HOSPITAL_COMMUNITY): Payer: Medicare HMO | Admitting: Physical Therapy

## 2021-04-25 ENCOUNTER — Other Ambulatory Visit: Payer: Self-pay

## 2021-04-25 ENCOUNTER — Encounter (HOSPITAL_COMMUNITY): Payer: Self-pay | Admitting: Physical Therapy

## 2021-04-25 ENCOUNTER — Ambulatory Visit (HOSPITAL_COMMUNITY): Payer: Medicare HMO | Admitting: Physical Therapy

## 2021-04-25 DIAGNOSIS — M6281 Muscle weakness (generalized): Secondary | ICD-10-CM

## 2021-04-25 DIAGNOSIS — R262 Difficulty in walking, not elsewhere classified: Secondary | ICD-10-CM | POA: Diagnosis not present

## 2021-04-25 DIAGNOSIS — Z9181 History of falling: Secondary | ICD-10-CM

## 2021-04-25 NOTE — Therapy (Signed)
[facility] [address] [place], [place], [address] Phone: [phone]   Fax:  [phone]  Physical Therapy Treatment  Patient Details  Name: Katherine Middleton MRN: [id number] Date of Birth: 1951-09-30 Referring Provider (PT): Bing Matter   Encounter Date: 04/25/2021   PT End of Session - 04/25/21 1358    Visit Number 3    Number of Visits 12    Date for PT Re-Evaluation 05/24/21    Authorization Type [employer] : Josem Kaufmann put in for 12 visits    Progress Note Due on Visit 10    PT Start Time 1400    PT Stop Time 1440    PT Time Calculation (min) 40 min    Equipment Utilized During Treatment Gait belt    Activity Tolerance Patient limited by fatigue;Patient tolerated treatment well    Behavior During Therapy [facility] for tasks assessed/performed           Past Medical History:  Diagnosis Date  . Arthritis   . Bipolar disorder ([place])   . Fibromyalgia   . GERD (gastroesophageal reflux disease) 12/31/2002  . Osteoarthritis   . Sinusitis 12/31/2012    Past Surgical History:  Procedure Laterality Date  . TONSILLECTOMY  04/08/57   some date around then  . TUBAL LIGATION  10/21/1984    There were no vitals filed for this visit.   Subjective Assessment - 04/25/21 1400    Subjective Patient is having back pain in the middle of her back. She has been doing some of her home exercises.    Pertinent History fibromyalgia, bipolar, polypharmacy    Patient Stated Goals to be more stable    Currently in Pain? Yes    Pain Score 8     Pain Location Back    Pain Orientation Mid    Pain Descriptors / Indicators Aching    Pain Type Chronic pain    Pain Onset More than a month ago                             [facility] Adult PT Treatment/Exercise - 04/25/21 0001      Knee/Hip Exercises: Standing   Heel Raises Both;3 seconds;10 reps;2 sets    Heel Raises Limitations toe raises 2x 10    Hip Flexion Both;10 reps;1 set    Hip  Abduction Both;2 sets;10 reps    Forward Step Up Both;10 reps;Hand Hold: 1;Step Height: 4";1 set      Knee/Hip Exercises: Seated   Long Arc Quad Both;2 sets;5 reps    Long [employer] Limitations 5 second holds    Sit to [employer] 5 reps;without UE support                  PT Education - 04/25/21 1400    Education Details HEP, exercise mechanics    Person(s) Educated Patient    Methods Explanation;Demonstration    Comprehension Verbalized understanding;Returned demonstration            PT Short Term Goals - 04/12/21 1530      PT SHORT TERM GOAL #1   Title PT to be I in a HEp to improve balance and strength to be able to state that she has not fallen in the past 2 weeks.    Time 3    Period Weeks    Status New    Target Date 05/03/21      PT SHORT TERM  GOAL #2   Title Pt to increase in core and LE strength as evident by being able to come sit to stand 8 x in 30 seconds    Time 3    Period Weeks    Status New      PT SHORT TERM GOAL #3   Title Balance to improve to be able to ambulate with single point cane 210 ft in a 2 mintues period of time    Time 3    Period Weeks    Status New             PT Long Term Goals - 04/12/21 1533      PT LONG TERM GOAL #1   Title Pt to be I in an advance HEP to be able to clean her house for 15 mintues without having to rest    Time 6    Period Weeks    Status New    Target Date 05/24/21      PT LONG TERM GOAL #2   Title Pt to be able to single leg stance for 15 seconds on each leg to reduce risk of falling    Time 6    Period Weeks    Status New      PT LONG TERM GOAL #3   Title PT to be able to ambulate 250' in 2 mintues to demonstrate improved balance an functional activity tolerance.    Time 6    Period Weeks    Status New      PT LONG TERM GOAL #4   Title PT core and LE strength increased one grade to allow pt to be able to come sit to stand from couches without use of UE assist    Time 6    Period Weeks     Status New                 Plan - 04/25/21 1359    Clinical Impression Statement Patient unable to recall HEP at this time. Patient tolerates increased reps of heel and toe raises. Added additional LE strengthening which patient tolerates well with fatigue noted requiring several seated rest breaks. Patient requires unilateral UE support for balance with standing exercises. Patient states difficulty with getting up after falls, discussed working on floor<> standing transfers which patient would really like to work on. Patient with very labored sit to stand requiring min assist on several reps to transfer to full standing. Patient will continue to benefit from skilled physical therapy in order to reduce impairment and improve function.    Personal Factors and Comorbidities Fitness;Past/Current Experience;Time since onset of injury/illness/exacerbation;Social Background;Comorbidity 2    Comorbidities fibromyalgia, frequent falls, bipolar,    Examination-Activity Limitations Locomotion Level;Reach Overhead;Lift;Stairs;Stand    Examination-Participation Restrictions Cleaning;Community Activity;Occupation;Shop    Stability/Clinical Decision Making Evolving/Moderate complexity    Rehab Potential Good    PT Frequency 2x / week    PT Duration 6 weeks    PT Treatment/Interventions Patient/family education;Gait training;Stair training;Functional mobility training;Therapeutic activities;Therapeutic exercise;Balance training;Manual techniques    PT Next Visit Plan Continue heel/toe  raises, functional squat, step ups , side stepping progress strength and balance as able; work on transfers from floor    PT [place] bridge; all 4 SLR; 4/19: STS    Consulted and Agree with Plan of Care Patient           Patient will benefit from skilled therapeutic intervention in order to improve the following deficits  and impairments:  Pain,Abnormal gait,Decreased activity tolerance,Decreased  balance,Decreased strength,Difficulty walking  Visit Diagnosis: Difficulty in walking, not elsewhere classified  History of falling  Muscle weakness (generalized)     Problem List Patient Active Problem List   Diagnosis Date Noted  . Back pain, chronic 04/03/2013  . Anxiety 12/17/2012  . Vitamin D insufficiency 12/17/2012  . GERD (gastroesophageal reflux disease) 11/18/2012  . Bipolar 1 disorder ([place]) 11/18/2012  . Fibromyalgia 11/18/2012    2:39 PM, 04/25/21 Mearl Latin PT, DPT Physical Therapist at [facility] [place], [place], [address] Phone: [phone]   Fax:  [phone]  Name: Pattye Meda MRN: [id number] Date of Birth: February 26, 1951

## 2021-04-27 ENCOUNTER — Other Ambulatory Visit: Payer: Self-pay

## 2021-04-27 ENCOUNTER — Encounter (HOSPITAL_COMMUNITY): Payer: Self-pay

## 2021-04-27 ENCOUNTER — Ambulatory Visit (HOSPITAL_COMMUNITY): Payer: Medicare HMO

## 2021-04-27 DIAGNOSIS — R262 Difficulty in walking, not elsewhere classified: Secondary | ICD-10-CM | POA: Diagnosis not present

## 2021-04-27 DIAGNOSIS — M6281 Muscle weakness (generalized): Secondary | ICD-10-CM

## 2021-04-27 DIAGNOSIS — Z9181 History of falling: Secondary | ICD-10-CM

## 2021-04-27 NOTE — Therapy (Signed)
[facility] [place], [place], [address] Phone: [phone]   Fax:  [phone]  Physical Therapy Treatment  Patient Details  Name: Katherine Middleton MRN: [id number] Date of Birth: 15-Nov-1951 Referring Provider (PT): Bing Matter   Encounter Date: 04/27/2021   PT End of Session - 04/27/21 1538    Visit Number 4    Number of Visits 12    Date for PT Re-Evaluation 05/24/21    Authorization Type [employer] : Josem Kaufmann put in for 12 visits    Progress Note Due on Visit 10    PT Start Time 1535    PT Stop Time 1615    PT Time Calculation (min) 40 min    Equipment Utilized During Treatment Gait belt    Activity Tolerance Patient limited by fatigue;Patient tolerated treatment well    Behavior During Therapy [facility] for tasks assessed/performed           Past Medical History:  Diagnosis Date  . Arthritis   . Bipolar disorder ([place])   . Fibromyalgia   . GERD (gastroesophageal reflux disease) 12/31/2002  . Osteoarthritis   . Sinusitis 12/31/2012    Past Surgical History:  Procedure Laterality Date  . TONSILLECTOMY  04/08/57   some date around then  . TUBAL LIGATION  10/21/1984    There were no vitals filed for this visit.   Subjective Assessment - 04/27/21 1536    Subjective Pt reports normal pain, all the time. Pt reports exercises are going "pretty good". Pt denies any falls since last session.    Pertinent History fibromyalgia, bipolar, polypharmacy    Patient Stated Goals to be more stable    Currently in Pain? Yes    Pain Score 6     Pain Location Other (Comment)   generalized all over   Pain Descriptors / Indicators Aching    Pain Onset More than a month ago               [facility] Adult PT Treatment/Exercise - 04/27/21 0001      Ambulation/Gait   Assistive device Small based quad cane    Gait Comments gait training with L quad cane, cervical head turns laterally and flex/ext, speed changes and sudden stop and turns,  min G for safety, 3-4 minor LOB but able to recover without physical assist      Knee/Hip Exercises: Standing   Heel Raises 10 reps    Heel Raises Limitations no HHA    Hip Flexion Both;10 reps    Hip Flexion Limitations standing marching, no HHA    Hip Abduction Both;15 reps    Abduction Limitations therapist providing initial resistance at heel for form    Forward Step Up Both;10 reps;Step Height: 6"    Other Standing Knee Exercises weighted walking, 20#, backward and forward, 6 RT    Other Standing Knee Exercises sidestepping, 83ft both directions, min G and no AD      Knee/Hip Exercises: Seated   Sit to Sand 5 reps;2 sets;without UE support   eccentric lowering                 PT Education - 04/27/21 1537    Education Details continue HEP, exercise technique    Person(s) Educated Patient    Methods Explanation;Demonstration;Verbal cues    Comprehension Verbalized understanding;Returned demonstration            PT Short Term Goals - 04/27/21 1620      PT  SHORT TERM GOAL #1   Title PT to be I in a HEp to improve balance and strength to be able to state that she has not fallen in the past 2 weeks.    Time 3    Period Weeks    Status On-going    Target Date 05/03/21      PT SHORT TERM GOAL #2   Title Pt to increase in core and LE strength as evident by being able to come sit to stand 8 x in 30 seconds    Time 3    Period Weeks    Status On-going      PT SHORT TERM GOAL #3   Title Balance to improve to be able to ambulate with single point cane 210 ft in a 2 mintues period of time    Time 3    Period Weeks    Status On-going             PT Long Term Goals - 04/27/21 1620      PT LONG TERM GOAL #1   Title Pt to be I in an advance HEP to be able to clean her house for 15 mintues without having to rest    Time 6    Period Weeks    Status On-going      PT LONG TERM GOAL #2   Title Pt to be able to single leg stance for 15 seconds on each leg to reduce  risk of falling    Time 6    Period Weeks    Status On-going      PT LONG TERM GOAL #3   Title PT to be able to ambulate 250' in 2 mintues to demonstrate improved balance an functional activity tolerance.    Time 6    Period Weeks    Status On-going      PT LONG TERM GOAL #4   Title PT core and LE strength increased one grade to allow pt to be able to come sit to stand from couches without use of UE assist    Time 6    Period Weeks    Status On-going                 Plan - 04/27/21 1550    Clinical Impression Statement Pt instructed on BLE strengthening exercises in weight-bearing with occasional balance challenges. Added weighted walking forward and backwards with increased difficulty, requiring min A to min guard for steadying. Cued pt for quad, glute and HS activation with weight walking to decrease UE use with fair carryover. Pt with fatigue noted with eccentric lowering to sitting. Added gait training using quad cane with cervical head turns, speed changes and stop and turn with minor LOB instance, able to recover without physical assist, min guard for safety. Continue to progress as able.    Personal Factors and Comorbidities Fitness;Past/Current Experience;Time since onset of injury/illness/exacerbation;Social Background;Comorbidity 2    Comorbidities fibromyalgia, frequent falls, bipolar,    Examination-Activity Limitations Locomotion Level;Reach Overhead;Lift;Stairs;Stand    Examination-Participation Restrictions Cleaning;Community Activity;Occupation;Shop    Stability/Clinical Decision Making Evolving/Moderate complexity    Rehab Potential Good    PT Frequency 2x / week    PT Duration 6 weeks    PT Treatment/Interventions Patient/family education;Gait training;Stair training;Functional mobility training;Therapeutic activities;Therapeutic exercise;Balance training;Manual techniques    PT Next Visit Plan Continue strength training and add balance challenges as able; work  on transfers from floor when appropriate    PT Home  Exercise Plan bridge; all 4 SLR; 4/19: STS    Consulted and Agree with Plan of Care Patient           Patient will benefit from skilled therapeutic intervention in order to improve the following deficits and impairments:  Pain,Abnormal gait,Decreased activity tolerance,Decreased balance,Decreased strength,Difficulty walking  Visit Diagnosis: Difficulty in walking, not elsewhere classified  History of falling  Muscle weakness (generalized)     Problem List Patient Active Problem List   Diagnosis Date Noted  . Back pain, chronic 04/03/2013  . Anxiety 12/17/2012  . Vitamin D insufficiency 12/17/2012  . GERD (gastroesophageal reflux disease) 11/18/2012  . Bipolar 1 disorder ([place]) 11/18/2012  . Fibromyalgia 11/18/2012     Talbot Grumbling PT, DPT 04/27/21, 4:24 PM  [facility] [address] [place], [place], [address] Phone: [phone]   Fax:  [phone]  Name: Katherine Middleton MRN: [id number] Date of Birth: 27-Aug-1951

## 2021-05-01 ENCOUNTER — Telehealth (HOSPITAL_COMMUNITY): Payer: Self-pay | Admitting: Physical Therapy

## 2021-05-01 NOTE — Telephone Encounter (Signed)
pt cancelled appt because she has another appt with her MD at the same time

## 2021-05-02 ENCOUNTER — Telehealth (INDEPENDENT_AMBULATORY_CARE_PROVIDER_SITE_OTHER): Payer: Medicare HMO | Admitting: Psychiatry

## 2021-05-02 ENCOUNTER — Other Ambulatory Visit: Payer: Self-pay

## 2021-05-02 ENCOUNTER — Ambulatory Visit (HOSPITAL_COMMUNITY): Payer: Medicare HMO | Admitting: Physical Therapy

## 2021-05-02 ENCOUNTER — Encounter (HOSPITAL_COMMUNITY): Payer: Self-pay | Admitting: Psychiatry

## 2021-05-02 DIAGNOSIS — F319 Bipolar disorder, unspecified: Secondary | ICD-10-CM | POA: Diagnosis not present

## 2021-05-02 MED ORDER — ALPRAZOLAM 0.5 MG PO TABS: 0.5000 mg | ORAL_TABLET | Freq: Four times a day (QID) | ORAL | 1 refills | Status: DC | PRN

## 2021-05-02 MED ORDER — GABAPENTIN 300 MG PO CAPS: 600.0000 mg | ORAL_CAPSULE | Freq: Two times a day (BID) | ORAL | 2 refills | Status: DC

## 2021-05-02 MED ORDER — TRAZODONE HCL 150 MG PO TABS: 150.0000 mg | ORAL_TABLET | Freq: Every day | ORAL | 2 refills | Status: DC

## 2021-05-02 MED ORDER — PAROXETINE HCL 40 MG PO TABS: 40.0000 mg | ORAL_TABLET | ORAL | 2 refills | Status: DC

## 2021-05-02 NOTE — Progress Notes (Signed)
Virtual Visit via Telephone Note  I connected with Katherine Middleton on 05/02/21 at  1:40 PM EDT by telephone and verified that I am speaking with the correct person using two identifiers.  Location: Patient: home Provider: office   I discussed the limitations, risks, security and privacy concerns of performing an evaluation and management service by telephone and the availability of in person appointments. I also discussed with the patient that there may be a patient responsible charge related to this service. The patient expressed understanding and agreed to proceed.    I discussed the assessment and treatment plan with the patient. The patient was provided an opportunity to ask questions and all were answered. The patient agreed with the plan and demonstrated an understanding of the instructions.   The patient was advised to call back or seek an in-person evaluation if the symptoms worsen or if the condition fails to improve as anticipated.  I provided 15 minutes of non-face-to-face time during this encounter.   Levonne Spiller, MD  [facility] MD/PA/NP OP Progress Note  05/02/2021 1:58 PM Katherine Middleton  MRN:  [id number]  Chief Complaint:  Chief Complaint    Depression; Anxiety     HPI: Patient is 70year-oldwidowedCaucasian female who livesalonein [place]. . She is on disability for fibromyalgia arthritis and bipolar disorder.  The patient has been seeing a psychiatrist for years. Initially she was diagnosed with depression but eventually she developed some manic symptoms and now is designated as having bipolar disorder. She's often very tired due to her fibromyalgia and has chronic muscle aches and pains. She feels that the medicines she is on now been very helpful. She sleeping well and her mood is stable. She's not using drugs or alcohol denies suicidal ideation. She's never been psychotic but mentions having "breakdown" about 10 years ago. She's never been hospitalized.  The  patient returns for follow-up after 3 months.  Since I last saw her she was seen in the emergency room in March because of acute confusion.  It was thought that perhaps she had a stroke but she had a normal MRI of the brain as well as fairly normal laboratories.  She was supposed to have a CT of the chest and abdomen that day and had taken prednisone as a precaution because she is allergic to contrast.  She states that the prednisone "made me crazy."  She still has not done the CT.  She still has not regained any weight and is at 99 pounds.  She claims that she eats but it sounds fairly sporadic.  She is still smoking 10 cigarettes a day and this is concerning and I told her that she absolutely needs to go through with the CT scans to see what is going on here.  The patient was prescribed mirtazapine by her PCP but could not take it because "it made me feel manic and anxious."  She has gone back to trazodone 150 mg at bedtime.  She denies being depressed or anxious now she is.  She states her mood is good.  She is making good sense and is clear in her thinking but I am concerned about her weight loss as is her PCP. Visit Diagnosis:    ICD-10-CM   1. Bipolar 1 disorder (HCC)  F31.9 PARoxetine (PAXIL) 40 MG tablet    traZODone (DESYREL) 150 MG tablet    Past Psychiatric History: Long-term outpatient treatment  Past Medical History:  Past Medical History:  Diagnosis Date  . Arthritis   .  Bipolar disorder ([place])   . Fibromyalgia   . GERD (gastroesophageal reflux disease) 12/31/2002  . Osteoarthritis   . Sinusitis 12/31/2012    Past Surgical History:  Procedure Laterality Date  . TONSILLECTOMY  04/08/57   some date around then  . TUBAL LIGATION  10/21/1984    Family Psychiatric History: see below  Family History:  Family History  Problem Relation Age of Onset  . Bipolar disorder Sister   . Anxiety disorder Sister   . Dementia Sister 93  . Paranoid behavior Sister   . Bipolar disorder Father    . Schizophrenia Father   . Alcohol abuse Maternal Uncle   . Alcohol abuse Paternal Uncle   . Anxiety disorder Mother   . ADD / ADHD Other   . ADD / ADHD Other   . Healthy Other   . OCD Neg Hx   . Seizures Neg Hx   . Sexual abuse Neg Hx   . Physical abuse Neg Hx     Social History:  Social History   Socioeconomic History  . Marital status: Married    Spouse name: Not on file  . Number of children: Not on file  . Years of education: Not on file  . Highest education level: Not on file  Occupational History  . Not on file  Tobacco Use  . Smoking status: Current Every Day Smoker    Packs/day: 0.50    Years: 30.00    Pack years: 15.00    Types: Cigarettes  . Smokeless tobacco: Never Used  . Tobacco comment: 11-15 cigs a day as of 05/01/2013  Substance and Sexual Activity  . Alcohol use: No  . Drug use: No  . Sexual activity: Yes    Partners: Male    Birth control/protection: Post-menopausal  Other Topics Concern  . Not on file  Social History Narrative  . Not on file   Social Determinants of Health   Financial Resource Strain: Not on file  Food Insecurity: Not on file  Transportation Needs: Not on file  Physical Activity: Not on file  Stress: Not on file  Social Connections: Not on file    Allergies:  Allergies  Allergen Reactions  . Codeine Itching  . Cymbalta [Duloxetine Hcl] Other (See Comments)    GERD  . Duloxetine     Other reaction(s): Other (See Comments), Unknown GERD   . Oxycodone Itching and Other (See Comments)    "OUT THERE", climbing the walls  . Baclofen Other (See Comments)    Anxiety got much worse and possibly ppted manic episode Other reaction(s): Unknown  . Povidone Iodine Itching and Other (See Comments)    Particularly if in a douche  Other reaction(s): Unknown  . Amoxicillin-Pot Clavulanate     Other reaction(s): Other (See Comments), Unknown other   . Cephalosporins     Other reaction(s): Unknown  . Flonase [Fluticasone]      Makes eye pressure to increase  . Penicillins Swelling  . Propoxyphene     Other reaction(s): Other (See Comments), Unknown other   . Sulfa Antibiotics Swelling  . Sulfasalazine Swelling    Other reaction(s): Unknown  . Tramadol     Other reaction(s): Other (See Comments), Unknown Patient states that this makes her nervous   . Amitriptyline Other (See Comments)    Caused bowels to empty quickly and couldn't sleep on it. Other reaction(s): Other (See Comments), Unknown Caused bowels to empty quickly and couldn't sleep on it. Caused bowels to empty  quickly and couldn't sleep on it. Caused bowels to empty quickly and couldn't sleep on it.   . Aspirin Hives and Rash  . Latex Swelling and Other (See Comments)    If in the mouth, it swells  . Nsaids Nausea Only and Other (See Comments)    Almost passing out, bad acid reflux Other reaction(s): Other (See Comments) other  . Tolmetin Nausea Only    Other reaction(s): Other (See Comments), Unknown Almost passing out, bad acid reflux Almost passing out, bad acid reflux     Metabolic Disorder Labs: No results found for: HGBA1C, MPG No results found for: PROLACTIN No results found for: CHOL, TRIG, HDL, CHOLHDL, VLDL, LDLCALC No results found for: TSH  Therapeutic Level Labs: No results found for: LITHIUM No results found for: VALPROATE No components found for:  CBMZ  Current Medications: Current Outpatient Medications  Medication Sig Dispense Refill  . acetaminophen (TYLENOL 8 HOUR) 650 MG CR tablet Take 650 mg by mouth at bedtime.    . ALPRAZolam (XANAX) 0.5 MG tablet Take 1 tablet (0.5 mg total) by mouth 4 (four) times daily as needed. 360 tablet 1  . Calcium Citrate-Vitamin D (CALCIUM + D PO) Take by mouth daily.    . cetirizine (ZYRTEC) 10 MG tablet Take 10 mg by mouth daily.    Marland Kitchen gabapentin (NEURONTIN) 300 MG capsule Take 2 capsules (600 mg total) by mouth 2 (two) times daily. 480 capsule 2  . omeprazole (PRILOSEC) 20  MG capsule Take 20 mg by mouth daily.     Marland Kitchen PARoxetine (PAXIL) 40 MG tablet Take 1 tablet (40 mg total) by mouth every morning. 90 tablet 2  . simvastatin (ZOCOR) 20 MG tablet Take 20 mg by mouth.    . sodium chloride (OCEAN) 0.65 % SOLN nasal spray Place 1 spray into both nostrils as needed for congestion.    . TiZANidine HCl (ZANAFLEX PO) Take 4 mg by mouth 2 (two) times daily.    . traZODone (DESYREL) 150 MG tablet Take 1 tablet (150 mg total) by mouth at bedtime. 90 tablet 2   No current facility-administered medications for this visit.     Musculoskeletal: Strength & Muscle Tone: decreased Gait & Station: unsteady Patient leans: N/A  Psychiatric Specialty Exam: Review of Systems  Musculoskeletal: Positive for arthralgias and myalgias.  Neurological: Positive for weakness.  All other systems reviewed and are negative.   There were no vitals taken for this visit.There is no height or weight on file to calculate BMI.  General Appearance: NA  Eye Contact:  NA  Speech:  Clear and Coherent  Volume:  Normal  Mood:  Euthymic  Affect:  NA  Thought Process:  Goal Directed  Orientation:  Full (Time, Place, and Person)  Thought Content: WDL   Suicidal Thoughts:  No  Homicidal Thoughts:  No  Memory:  Immediate;   Good Recent;   Good Remote;   Fair  Judgement:  Fair  Insight:  Fair  Psychomotor Activity:  Decreased  Concentration:  Concentration: Good and Attention Span: Good  Recall:  Good  Fund of Knowledge: Good  Language: Good  Akathisia:  No  Handed:  Right  AIMS (if indicated): not done  Assets:  Communication Skills Desire for Improvement Resilience Social Support  ADL's:  Intact  Cognition: WNL  Sleep:  Good   Screenings: PHQ2-9   Flowsheet Row Video Visit from 05/02/2021 in [facility] Video Visit from 03/01/2021 in [facility] [place]  [facility]  PHQ-2 Total Score 0 0    Flowsheet Row Video  Visit from 05/02/2021 in [facility] ED from 03/22/2021 in [place] No Risk No Risk       Assessment and Plan: This patient is a 70 year old female with a history of bipolar disorder.  She has still not regained any weight and her PCP is trying to get to the cause of this.  She still needs to get the scans done to make sure there is nothing occult going on.  She could not tolerate mirtazapine so that she is gone back to trazodone 150 mg at bedtime and is sleeping well.  She will continue Xanax 0.5 mg 4 times daily only as needed for anxiety, gabapentin 600 mg twice daily for anxiety and fibromyalgia, Paxil 40 mg daily for depression, she will return to see me in 3 months   Levonne Spiller, MD 05/02/2021, 1:58 PM

## 2021-05-04 ENCOUNTER — Other Ambulatory Visit: Payer: Self-pay

## 2021-05-04 ENCOUNTER — Encounter (HOSPITAL_COMMUNITY): Payer: Self-pay

## 2021-05-04 ENCOUNTER — Ambulatory Visit (HOSPITAL_COMMUNITY): Payer: Medicare HMO | Attending: Family Medicine

## 2021-05-04 DIAGNOSIS — Z9181 History of falling: Secondary | ICD-10-CM | POA: Insufficient documentation

## 2021-05-04 DIAGNOSIS — M6281 Muscle weakness (generalized): Secondary | ICD-10-CM | POA: Insufficient documentation

## 2021-05-04 DIAGNOSIS — R262 Difficulty in walking, not elsewhere classified: Secondary | ICD-10-CM | POA: Insufficient documentation

## 2021-05-04 NOTE — Therapy (Signed)
[facility] [address] [place], [place], [address] Phone: [phone]   Fax:  [phone]  Physical Therapy Treatment  Patient Details  Name: Katherine Middleton MRN: [id number] Date of Birth: 29-Oct-1951 Referring Provider (PT): Bing Matter   Encounter Date: 05/04/2021   PT End of Session - 05/04/21 1527    Visit Number 5    Number of Visits 12    Date for PT Re-Evaluation 05/24/21    Authorization Type [employer] :12 visits approved    Authorization Time Period 4/13-->05/24/21    Authorization - Visit Number 5    Authorization - Number of Visits 12    Progress Note Due on Visit 10    PT Start Time 1450    PT Stop Time 1528    PT Time Calculation (min) 38 min    Equipment Utilized During Treatment Gait belt    Activity Tolerance Patient limited by fatigue;Patient tolerated treatment well    Behavior During Therapy [facility] for tasks assessed/performed           Past Medical History:  Diagnosis Date  . Arthritis   . Bipolar disorder ([place])   . Fibromyalgia   . GERD (gastroesophageal reflux disease) 12/31/2002  . Osteoarthritis   . Sinusitis 12/31/2012    Past Surgical History:  Procedure Laterality Date  . TONSILLECTOMY  04/08/57   some date around then  . TUBAL LIGATION  10/21/1984    There were no vitals filed for this visit.   Subjective Assessment - 05/04/21 1456    Subjective Pt reports fall onto Bil knee last week with increased pain Bil knees today.  Stated she required assistance to stand.    Patient Stated Goals to be more stable    Currently in Pain? Yes    Pain Score 8     Pain Location Knee    Pain Orientation Right;Left    Pain Descriptors / Indicators Aching;Sore    Pain Type Chronic pain    Pain Onset More than a month ago    Pain Frequency Constant    Aggravating Factors  standing, walking    Pain Relieving Factors sitting, pain meds    Effect of Pain on Daily Activities limits                              [facility] Adult PT Treatment/Exercise - 05/04/21 0001      Ambulation/Gait   Assistive device Small based quad cane      Knee/Hip Exercises: Machines for Strengthening   Other Machine bodycraft 3RT retro and forward walking with 20#      Knee/Hip Exercises: Standing   Hip Flexion Both;10 reps    Hip Flexion Limitations standing marching, no HHA    Forward Lunges 5 reps    Forward Lunges Limitations Posterior lunge with HHA    Hip Abduction Both;15 reps    Abduction Limitations therapist providing initial resistance at heel for form    SLS 5x Rt 5", Lt 3"    Gait Training SBQC cueing for sequence and encoruagedc to increase stride length    Other Standing Knee Exercises weighted walking, 20#, backward and forward, 6 RT      Knee/Hip Exercises: Prone   Other Prone Exercises quadruped hip extension then flex for ab set    Other Prone Exercises attempted quadruped to half kneeling- unable to bring foot between hands  PT Short Term Goals - 04/27/21 1620      PT SHORT TERM GOAL #1   Title PT to be I in a HEp to improve balance and strength to be able to state that she has not fallen in the past 2 weeks.    Time 3    Period Weeks    Status On-going    Target Date 05/03/21      PT SHORT TERM GOAL #2   Title Pt to increase in core and LE strength as evident by being able to come sit to stand 8 x in 30 seconds    Time 3    Period Weeks    Status On-going      PT SHORT TERM GOAL #3   Title Balance to improve to be able to ambulate with single point cane 210 ft in a 2 mintues period of time    Time 3    Period Weeks    Status On-going             PT Long Term Goals - 04/27/21 1620      PT LONG TERM GOAL #1   Title Pt to be I in an advance HEP to be able to clean her house for 15 mintues without having to rest    Time 6    Period Weeks    Status On-going      PT LONG TERM GOAL #2   Title Pt to be able to  single leg stance for 15 seconds on each leg to reduce risk of falling    Time 6    Period Weeks    Status On-going      PT LONG TERM GOAL #3   Title PT to be able to ambulate 250' in 2 mintues to demonstrate improved balance an functional activity tolerance.    Time 6    Period Weeks    Status On-going      PT LONG TERM GOAL #4   Title PT core and LE strength increased one grade to allow pt to be able to come sit to stand from couches without use of UE assist    Time 6    Period Weeks    Status On-going                 Plan - 05/04/21 1534    Clinical Impression Statement Educated mechanics to assist with floor to standing following reports of recent falls.  Pt demonstrated with significant LE weakness and unable to complete quadruped to half kneeling position.  Therex focus wiht hip strengthening and balance training, min A for safety and required HHA or stepping outside BOS.  Pt limited by fatigue wiht activities, required short duration seated rest breaks through session.    Personal Factors and Comorbidities Fitness;Past/Current Experience;Time since onset of injury/illness/exacerbation;Social Background;Comorbidity 2    Comorbidities fibromyalgia, frequent falls, bipolar,    Examination-Activity Limitations Locomotion Level;Reach Overhead;Lift;Stairs;Stand    Examination-Participation Restrictions Cleaning;Community Activity;Occupation;Shop    Stability/Clinical Decision Making Evolving/Moderate complexity    Clinical Decision Making Moderate    Rehab Potential Good    PT Frequency 2x / week    PT Duration 6 weeks    PT Treatment/Interventions Patient/family education;Gait training;Stair training;Functional mobility training;Therapeutic activities;Therapeutic exercise;Balance training;Manual techniques    PT Next Visit Plan Advance HEP next session.  Continue strength training and add balance challenges as able; work on transfers from floor when appropriate    PT [place]  bridge; all 4 SLR; 4/19: STS    Consulted and Agree with Plan of Care Patient           Patient will benefit from skilled therapeutic intervention in order to improve the following deficits and impairments:  Pain,Abnormal gait,Decreased activity tolerance,Decreased balance,Decreased strength,Difficulty walking  Visit Diagnosis: Muscle weakness (generalized)  History of falling  Difficulty in walking, not elsewhere classified     Problem List Patient Active Problem List   Diagnosis Date Noted  . Back pain, chronic 04/03/2013  . Anxiety 12/17/2012  . Vitamin D insufficiency 12/17/2012  . GERD (gastroesophageal reflux disease) 11/18/2012  . Bipolar 1 disorder ([place]) 11/18/2012  . Fibromyalgia 11/18/2012   Ihor Austin, LPTA/CLT; CBIS [phone]  Aldona Lento 05/04/2021, 4:17 PM  [facility] [address] [place], [place], [address] Phone: [phone]   Fax:  [phone]  Name: Nickolette Espinola MRN: [id number] Date of Birth: 09-18-1951

## 2021-05-09 ENCOUNTER — Ambulatory Visit (HOSPITAL_COMMUNITY): Payer: Medicare HMO | Admitting: Physical Therapy

## 2021-05-09 ENCOUNTER — Other Ambulatory Visit: Payer: Self-pay

## 2021-05-09 DIAGNOSIS — M6281 Muscle weakness (generalized): Secondary | ICD-10-CM | POA: Diagnosis not present

## 2021-05-09 DIAGNOSIS — Z9181 History of falling: Secondary | ICD-10-CM

## 2021-05-09 DIAGNOSIS — R262 Difficulty in walking, not elsewhere classified: Secondary | ICD-10-CM

## 2021-05-09 NOTE — Therapy (Signed)
[facility] [address] [place], [place], [address] Phone: [phone]   Fax:  [phone]  Physical Therapy Treatment  Patient Details  Name: Katherine Middleton MRN: [id number] Date of Birth: 29-Aug-1951 Referring Provider (PT): Bing Matter   Encounter Date: 05/09/2021   PT End of Session - 05/09/21 1512    Visit Number 6    Number of Visits 12    Date for PT Re-Evaluation 05/24/21    Authorization Type [employer] :12 visits approved    Authorization Time Period 4/13-->05/24/21    Authorization - Visit Number 6    Authorization - Number of Visits 12    Progress Note Due on Visit 10    PT Start Time 1450    PT Stop Time 1530    PT Time Calculation (min) 40 min    Equipment Utilized During Treatment Gait belt    Activity Tolerance Patient limited by fatigue;Patient tolerated treatment well    Behavior During Therapy [facility] for tasks assessed/performed           Past Medical History:  Diagnosis Date  . Arthritis   . Bipolar disorder ([place])   . Fibromyalgia   . GERD (gastroesophageal reflux disease) 12/31/2002  . Osteoarthritis   . Sinusitis 12/31/2012    Past Surgical History:  Procedure Laterality Date  . TONSILLECTOMY  04/08/57   some date around then  . TUBAL LIGATION  10/21/1984    There were no vitals filed for this visit.   Subjective Assessment - 05/09/21 1454    Subjective Pt states that her knees are bothering her    Pertinent History fibromyalgia, bipolar, polypharmacy    Patient Stated Goals to be more stable    Currently in Pain? Yes    Pain Score 8     Pain Location Knee    Pain Orientation Right;Left    Pain Descriptors / Indicators Aching    Pain Type Acute pain   pt fell on them   Pain Onset More than a month ago    Pain Frequency Constant                             [facility] Adult PT Treatment/Exercise - 05/09/21 0001      Exercises   Exercises Knee/Hip      Knee/Hip Exercises: Standing    Heel Raises Both;10 reps    Hip Flexion Both;10 reps    Hip Flexion Limitations standing marching, no HHA    Forward Lunges 5 reps    Hip Abduction Both;10 reps    Forward Step Up Both;Step Height: 6";5 reps    Functional Squat 10 reps    SLS 5x Rt 5", Lt 3"    Gait Training SBQC cueing for sequence and encoruagedc to increase stride length      Knee/Hip Exercises: Seated   Long Arc Quad Both;10 reps    Long Arc Quad Weight 3 lbs.    Sit to Sand 5 reps;2 sets;without UE support   eccentric lowering     Knee/Hip Exercises: Supine   Bridges Both;10 reps   10"   Straight Leg Raises Both;10 reps    Other Supine Knee/Hip Exercises abduction to painful to complete sidelyng      Knee/Hip Exercises: Sidelying   Hip ABduction --                    PT Short Term Goals -  04/27/21 1620      PT SHORT TERM GOAL #1   Title PT to be I in a HEp to improve balance and strength to be able to state that she has not fallen in the past 2 weeks.    Time 3    Period Weeks    Status On-going    Target Date 05/03/21      PT SHORT TERM GOAL #2   Title Pt to increase in core and LE strength as evident by being able to come sit to stand 8 x in 30 seconds    Time 3    Period Weeks    Status On-going      PT SHORT TERM GOAL #3   Title Balance to improve to be able to ambulate with single point cane 210 ft in a 2 mintues period of time    Time 3    Period Weeks    Status On-going             PT Long Term Goals - 04/27/21 1620      PT LONG TERM GOAL #1   Title Pt to be I in an advance HEP to be able to clean her house for 15 mintues without having to rest    Time 6    Period Weeks    Status On-going      PT LONG TERM GOAL #2   Title Pt to be able to single leg stance for 15 seconds on each leg to reduce risk of falling    Time 6    Period Weeks    Status On-going      PT LONG TERM GOAL #3   Title PT to be able to ambulate 250' in 2 mintues to demonstrate improved balance  an functional activity tolerance.    Time 6    Period Weeks    Status On-going      PT LONG TERM GOAL #4   Title PT core and LE strength increased one grade to allow pt to be able to come sit to stand from couches without use of UE assist    Time 6    Period Weeks    Status On-going                 Plan - 05/09/21 1513    Clinical Impression Statement PT continues to have knee pain from her fall.  Treatment adjusted to reduce knee pain.  Added heel raises and side step to HEP.  PT still having diffculty increasing her stride length when walking and with balance.    Personal Factors and Comorbidities Fitness;Past/Current Experience;Time since onset of injury/illness/exacerbation;Social Background;Comorbidity 2    Comorbidities fibromyalgia, frequent falls, bipolar,    Examination-Activity Limitations Locomotion Level;Reach Overhead;Lift;Stairs;Stand    Examination-Participation Restrictions Cleaning;Community Activity;Occupation;Shop    Stability/Clinical Decision Making Evolving/Moderate complexity    Rehab Potential Good    PT Frequency 2x / week    PT Duration 6 weeks    PT Treatment/Interventions Patient/family education;Gait training;Stair training;Functional mobility training;Therapeutic activities;Therapeutic exercise;Balance training;Manual techniques    PT Next Visit Plan Advance HEP next session.  Continue strength training and add balance challenges as able; work on transfers from floor when appropriate    PT Home Exercise Plan bridge; all 4 SLR; 4/19: STS/ 5/10: heel raises, side stepping.    Consulted and Agree with Plan of Care Patient           Patient will benefit from skilled therapeutic  intervention in order to improve the following deficits and impairments:  Pain,Abnormal gait,Decreased activity tolerance,Decreased balance,Decreased strength,Difficulty walking  Visit Diagnosis: Muscle weakness (generalized)  History of falling  Difficulty in walking,  not elsewhere classified     Problem List Patient Active Problem List   Diagnosis Date Noted  . Back pain, chronic 04/03/2013  . Anxiety 12/17/2012  . Vitamin D insufficiency 12/17/2012  . GERD (gastroesophageal reflux disease) 11/18/2012  . Bipolar 1 disorder ([place]) 11/18/2012  . Fibromyalgia 11/18/2012    Rayetta Humphrey, PT CLT [phone] 05/09/2021, 3:30 PM  [facility] [address] [place], [place], [address] Phone: [phone]   Fax:  [phone]  Name: Katherine Middleton MRN: [id number] Date of Birth: 1951-12-25

## 2021-05-11 ENCOUNTER — Ambulatory Visit (HOSPITAL_COMMUNITY): Payer: Medicare HMO | Admitting: Physical Therapy

## 2021-05-11 ENCOUNTER — Encounter (HOSPITAL_COMMUNITY): Payer: Self-pay | Admitting: Physical Therapy

## 2021-05-11 ENCOUNTER — Other Ambulatory Visit: Payer: Self-pay

## 2021-05-11 DIAGNOSIS — R262 Difficulty in walking, not elsewhere classified: Secondary | ICD-10-CM

## 2021-05-11 DIAGNOSIS — M6281 Muscle weakness (generalized): Secondary | ICD-10-CM

## 2021-05-11 DIAGNOSIS — Z9181 History of falling: Secondary | ICD-10-CM

## 2021-05-11 NOTE — Therapy (Signed)
[facility] [address] [place], [place], [address] Phone: [phone]   Fax:  [phone]  Physical Therapy Treatment  Patient Details  Name: Katherine Middleton MRN: [id number] Date of Birth: [birth year] Referring Provider (PT): Bing Matter   Encounter Date: 05/11/2021   PT End of Session - 05/11/21 1454    Visit Number 7    Number of Visits 12    Date for PT Re-Evaluation 05/24/21    Authorization Type [employer] :12 visits approved    Authorization Time Period 4/13-->05/24/21    Authorization - Visit Number 7    Authorization - Number of Visits 12    Progress Note Due on Visit 10    PT Start Time 1455    PT Stop Time 1533    PT Time Calculation (min) 38 min    Equipment Utilized During Treatment Gait belt    Activity Tolerance Patient limited by fatigue;Patient tolerated treatment well    Behavior During Therapy [facility] for tasks assessed/performed           Past Medical History:  Diagnosis Date  . Arthritis   . Bipolar disorder ([place])   . Fibromyalgia   . GERD (gastroesophageal reflux disease) 12/31/2002  . Osteoarthritis   . Sinusitis 12/31/2012    Past Surgical History:  Procedure Laterality Date  . TONSILLECTOMY  04/08/57   some date around then  . TUBAL LIGATION  10/21/1984    There were no vitals filed for this visit.   Subjective Assessment - 05/11/21 1455    Subjective Patient states no new falls. Her knees are still bothering her.    Pertinent History fibromyalgia, bipolar, polypharmacy    Patient Stated Goals to be more stable    Currently in Pain? Yes    Pain Score 7     Pain Location Back    Pain Orientation Lower    Pain Descriptors / Indicators Aching    Pain Type Chronic pain    Pain Onset More than a month ago    Pain Frequency Constant                             [facility] Adult PT Treatment/Exercise - 05/11/21 0001      Knee/Hip Exercises: Standing   Hip Flexion Both;2 sets;15 reps     Hip Flexion Limitations standing marching, unilateral HHA    Hip Abduction Both;1 set;15 reps    Forward Step Up Both;Step Height: 6";10 reps    Gait Training SBQC cueing for sequence and encoruaged to increase stride length and improve foot clearance    Other Standing Knee Exercises hurdles 6 inch 4RT    Other Standing Knee Exercises lateral stepping 4 x 15 feet bilateral      Knee/Hip Exercises: Seated   Sit to Sand 5 reps;2 sets                  PT Education - 05/11/21 1455    Education Details HEP, exercise mechanics, walking mechanics    Person(s) Educated Patient    Methods Explanation;Demonstration    Comprehension Verbalized understanding;Returned demonstration            PT Short Term Goals - 04/27/21 1620      PT SHORT TERM GOAL #1   Title PT to be I in a HEp to improve balance and strength to be able to state that she has not fallen in the past  2 weeks.    Time 3    Period Weeks    Status On-going    Target Date 05/03/21      PT SHORT TERM GOAL #2   Title Pt to increase in core and LE strength as evident by being able to come sit to stand 8 x in 30 seconds    Time 3    Period Weeks    Status On-going      PT SHORT TERM GOAL #3   Title Balance to improve to be able to ambulate with single point cane 210 ft in a 2 mintues period of time    Time 3    Period Weeks    Status On-going             PT Long Term Goals - 04/27/21 1620      PT LONG TERM GOAL #1   Title Pt to be I in an advance HEP to be able to clean her house for 15 mintues without having to rest    Time 6    Period Weeks    Status On-going      PT LONG TERM GOAL #2   Title Pt to be able to single leg stance for 15 seconds on each leg to reduce risk of falling    Time 6    Period Weeks    Status On-going      PT LONG TERM GOAL #3   Title PT to be able to ambulate 250' in 2 mintues to demonstrate improved balance an functional activity tolerance.    Time 6    Period Weeks     Status On-going      PT LONG TERM GOAL #4   Title PT core and LE strength increased one grade to allow pt to be able to come sit to stand from couches without use of UE assist    Time 6    Period Weeks    Status On-going                 Plan - 05/11/21 1455    Clinical Impression Statement Patient requires intermittent unilateral HHA for balance with alternating march. She tolerates increased reps of most prior completed exercises. Patient ambulates with good/fair mechanics with cueing for increased step length and foot clearance. Patient with increased unsteadiness with stepping over with L leg vs. R leg with hurdles. Patient completes hurdles without UE use.  Patient continues to fatigue quickly with STS secondary to impaired LE strength. Patient fatigued at end of session. Patient will continue to benefit from skilled physical therapy in order to reduce impairment and improve function.    Personal Factors and Comorbidities Fitness;Past/Current Experience;Time since onset of injury/illness/exacerbation;Social Background;Comorbidity 2    Comorbidities fibromyalgia, frequent falls, bipolar,    Examination-Activity Limitations Locomotion Level;Reach Overhead;Lift;Stairs;Stand    Examination-Participation Restrictions Cleaning;Community Activity;Occupation;Shop    Stability/Clinical Decision Making Evolving/Moderate complexity    Rehab Potential Good    PT Frequency 2x / week    PT Duration 6 weeks    PT Treatment/Interventions Patient/family education;Gait training;Stair training;Functional mobility training;Therapeutic activities;Therapeutic exercise;Balance training;Manual techniques    PT Next Visit Plan Advance HEP next session.  Continue strength training and add balance challenges as able; work on transfers from floor when appropriate    PT Home Exercise Plan bridge; all 4 SLR; 4/19: STS/ 5/10: heel raises, side stepping.    Consulted and Agree with Plan of Care Patient  Patient will benefit from skilled therapeutic intervention in order to improve the following deficits and impairments:  Pain,Abnormal gait,Decreased activity tolerance,Decreased balance,Decreased strength,Difficulty walking  Visit Diagnosis: Muscle weakness (generalized)  History of falling  Difficulty in walking, not elsewhere classified     Problem List Patient Active Problem List   Diagnosis Date Noted  . Back pain, chronic 04/03/2013  . Anxiety 12/17/2012  . Vitamin D insufficiency 12/17/2012  . GERD (gastroesophageal reflux disease) 11/18/2012  . Bipolar 1 disorder ([place]) 11/18/2012  . Fibromyalgia 11/18/2012   3:40 PM, 05/11/21 Mearl Latin PT, DPT Physical Therapist at [facility] [place], [place], [address] Phone: [phone]   Fax:  [phone]  Name: Katherine Middleton MRN: [id number] Date of Birth: June 23, 1951

## 2021-05-16 ENCOUNTER — Other Ambulatory Visit: Payer: Self-pay

## 2021-05-16 ENCOUNTER — Encounter (HOSPITAL_COMMUNITY): Payer: Self-pay

## 2021-05-16 ENCOUNTER — Ambulatory Visit (HOSPITAL_COMMUNITY): Payer: Medicare HMO

## 2021-05-16 DIAGNOSIS — M6281 Muscle weakness (generalized): Secondary | ICD-10-CM

## 2021-05-16 DIAGNOSIS — R262 Difficulty in walking, not elsewhere classified: Secondary | ICD-10-CM

## 2021-05-16 DIAGNOSIS — Z9181 History of falling: Secondary | ICD-10-CM

## 2021-05-16 NOTE — Therapy (Signed)
[facility] [address] [place], [place], [address] Phone: [phone]   Fax:  [phone]  Physical Therapy Treatment  Patient Details  Name: Katherine Middleton MRN: [id number] Date of Birth: [birth year] Referring Provider (PT): Bing Matter   Encounter Date: 05/16/2021   PT End of Session - 05/16/21 1518    Visit Number 8    Number of Visits 12    Date for PT Re-Evaluation 05/24/21    Authorization Type [employer] :12 visits approved    Authorization Time Period 4/13-->05/24/21    Authorization - Visit Number 8    Authorization - Number of Visits 12    Progress Note Due on Visit 10    PT Start Time 1315    PT Stop Time 1400    PT Time Calculation (min) 45 min    Equipment Utilized During Treatment Gait belt   SBA   Activity Tolerance Patient limited by fatigue;Patient tolerated treatment well    Behavior During Therapy [facility] for tasks assessed/performed           Past Medical History:  Diagnosis Date  . Arthritis   . Bipolar disorder ([place])   . Fibromyalgia   . GERD (gastroesophageal reflux disease) 12/31/2002  . Osteoarthritis   . Sinusitis 12/31/2012    Past Surgical History:  Procedure Laterality Date  . TONSILLECTOMY  04/08/57   some date around then  . TUBAL LIGATION  10/21/1984    There were no vitals filed for this visit.   Subjective Assessment - 05/16/21 1329    Subjective Pt stated her LBP is bothering her today, 7/10 pain scale.    Patient Stated Goals to be more stable    Currently in Pain? Yes    Pain Score 7     Pain Location Back    Pain Orientation Lower    Pain Descriptors / Indicators Aching;Sore    Pain Type Chronic pain    Pain Onset More than a month ago    Pain Frequency Constant    Aggravating Factors  standing, walking    Pain Relieving Factors sitting,pain meds    Effect of Pain on Daily Activities limits                             [facility] Adult PT Treatment/Exercise -  05/16/21 0001      Ambulation/Gait   Ambulation Distance (Feet) 400 Feet    Assistive device Small based quad cane    Gait Comments Cueing for sequence; reduced cadence with 2 point sequence      Knee/Hip Exercises: Standing   Hip Flexion 15 reps;Knee bent    Hip Flexion Limitations standing marching (alternating), unilateral HHA    Forward Lunges 10 reps    Forward Lunges Limitations minimal HHA    Other Standing Knee Exercises 12in hurdlers forward alternating x 2 RT, sidestep over hurdles      Knee/Hip Exercises: Seated   Sit to Sand 5 reps;2 sets   5 STS no HHA standard height 45"; 2nd set 38"                   PT Short Term Goals - 04/27/21 1620      PT SHORT TERM GOAL #1   Title PT to be I in a HEp to improve balance and strength to be able to state that she has not fallen in the past 2 weeks.  Time 3    Period Weeks    Status On-going    Target Date 05/03/21      PT SHORT TERM GOAL #2   Title Pt to increase in core and LE strength as evident by being able to come sit to stand 8 x in 30 seconds    Time 3    Period Weeks    Status On-going      PT SHORT TERM GOAL #3   Title Balance to improve to be able to ambulate with single point cane 210 ft in a 2 mintues period of time    Time 3    Period Weeks    Status On-going             PT Long Term Goals - 04/27/21 1620      PT LONG TERM GOAL #1   Title Pt to be I in an advance HEP to be able to clean her house for 15 mintues without having to rest    Time 6    Period Weeks    Status On-going      PT LONG TERM GOAL #2   Title Pt to be able to single leg stance for 15 seconds on each leg to reduce risk of falling    Time 6    Period Weeks    Status On-going      PT LONG TERM GOAL #3   Title PT to be able to ambulate 250' in 2 mintues to demonstrate improved balance an functional activity tolerance.    Time 6    Period Weeks    Status On-going      PT LONG TERM GOAL #4   Title PT core and LE  strength increased one grade to allow pt to be able to come sit to stand from couches without use of UE assist    Time 6    Period Weeks    Status On-going                 Plan - 05/16/21 1519    Clinical Impression Statement Gait training to improve sequence with [facility], improved sequence with 2 point sequence though demonstrates decreased cadence with change in sequence.  Continued hip strengthening and balance training.  Able to progress to 12in hurdles during forward and sidestepping wiht good presentatin, minimal HHA required.  Pt continues to demonstrate fatigue requiring seated rest breaks.  Pt with 1 LOB required therapist intervention to prevent fall, stated her ankle gave out.  Pt stated she has increased edema Bil ankle following standing or gait, educated on benefits with compression garments, measurements taken and paperwork given.  Due to fatigue not given additional HEP, add next session.    Personal Factors and Comorbidities Fitness;Past/Current Experience;Time since onset of injury/illness/exacerbation;Social Background;Comorbidity 2    Comorbidities fibromyalgia, frequent falls, bipolar,    Examination-Activity Limitations Locomotion Level;Reach Overhead;Lift;Stairs;Stand    Examination-Participation Restrictions Cleaning;Community Activity;Occupation;Shop    Stability/Clinical Decision Making Evolving/Moderate complexity    Clinical Decision Making Moderate    Rehab Potential Good    PT Frequency 2x / week    PT Duration 6 weeks    PT Treatment/Interventions Patient/family education;Gait training;Stair training;Functional mobility training;Therapeutic activities;Therapeutic exercise;Balance training;Manual techniques    PT Next Visit Plan Advance HEP next session.  Continue strength training and add balance challenges as able; work on transfers from floor when appropriate    PT Home Exercise Plan bridge; all 4 SLR; 4/19: STS/ 5/10: heel raises,  side stepping.    Consulted  and Agree with Plan of Care Patient           Patient will benefit from skilled therapeutic intervention in order to improve the following deficits and impairments:  Pain,Abnormal gait,Decreased activity tolerance,Decreased balance,Decreased strength,Difficulty walking  Visit Diagnosis: Muscle weakness (generalized)  History of falling  Difficulty in walking, not elsewhere classified     Problem List Patient Active Problem List   Diagnosis Date Noted  . Back pain, chronic 04/03/2013  . Anxiety 12/17/2012  . Vitamin D insufficiency 12/17/2012  . GERD (gastroesophageal reflux disease) 11/18/2012  . Bipolar 1 disorder ([place]) 11/18/2012  . Fibromyalgia 11/18/2012   Ihor Austin, LPTA/CLT; CBIS [phone]  Aldona Lento 05/16/2021, 3:25 PM  [facility] [address] [place], [place], [address] Phone: [phone]   Fax:  [phone]  Name: Shantal Roan MRN: [id number] Date of Birth: 06-11-1951

## 2021-05-18 ENCOUNTER — Encounter (HOSPITAL_COMMUNITY): Payer: Self-pay

## 2021-05-18 ENCOUNTER — Other Ambulatory Visit: Payer: Self-pay

## 2021-05-18 ENCOUNTER — Ambulatory Visit (HOSPITAL_COMMUNITY): Payer: Medicare HMO

## 2021-05-18 DIAGNOSIS — M6281 Muscle weakness (generalized): Secondary | ICD-10-CM

## 2021-05-18 DIAGNOSIS — Z9181 History of falling: Secondary | ICD-10-CM

## 2021-05-18 DIAGNOSIS — R262 Difficulty in walking, not elsewhere classified: Secondary | ICD-10-CM

## 2021-05-18 NOTE — Patient Instructions (Addendum)
HIP: Flexion / KNEE: Extension, Heel Strike - Standing            Standing tall holding on to solid surface.    Begin with hip flexed and hold for 5", without touching down bring leg to your side and hold for 5", then extend hip backwards and hold for 5"  FUNCTIONAL MOBILITY: Squat    Stance: shoulder-width on floor. Bend hips and knees. Keep back straight. Do not allow knees to bend past toes. Squeeze glutes and quads to stand. 10 reps per set, 1 sets per day, 5 days per week  Copyright  VHI. All rights reserved.   Tandem Stance    Standing close to counter/sink. Right foot in front of left, heel touching toe both feet "straight ahead". Stand on Foot Triangle of Support with both feet. Balance in this position 30 seconds. Do with left foot in front of right.  Copyright  VHI. All rights reserved.

## 2021-05-18 NOTE — Therapy (Signed)
[facility] [address] [place], [place], [address] Phone: [phone]   Fax:  [phone]  Physical Therapy Treatment  Patient Details  Name: Katherine Middleton MRN: [id number] Date of Birth: 1951/02/24 Referring Provider (PT): Bing Matter   Encounter Date: 05/18/2021   PT End of Session - 05/18/21 1503    Visit Number 9    Number of Visits 12    Date for PT Re-Evaluation 05/24/21    Authorization Type [employer] :12 visits approved    Authorization Time Period 4/13-->05/24/21    Authorization - Visit Number 9    Authorization - Number of Visits 12    Progress Note Due on Visit 10    PT Start Time 1450    PT Stop Time 1534    PT Time Calculation (min) 44 min    Equipment Utilized During Treatment Gait belt   SBA   Activity Tolerance Patient limited by fatigue;Patient tolerated treatment well    Behavior During Therapy [facility] for tasks assessed/performed           Past Medical History:  Diagnosis Date  . Arthritis   . Bipolar disorder ([place])   . Fibromyalgia   . GERD (gastroesophageal reflux disease) 12/31/2002  . Osteoarthritis   . Sinusitis 12/31/2012    Past Surgical History:  Procedure Laterality Date  . TONSILLECTOMY  04/08/57   some date around then  . TUBAL LIGATION  10/21/1984    There were no vitals filed for this visit.   Subjective Assessment - 05/18/21 1501    Subjective Pt stated her back continues to bother her, pain scale 8/10.    Patient Stated Goals to be more stable    Currently in Pain? Yes    Pain Score 8     Pain Location Back    Pain Orientation Lower    Pain Descriptors / Indicators Dull    Pain Type Chronic pain    Pain Onset More than a month ago    Pain Frequency Constant    Aggravating Factors  standing, walking    Pain Relieving Factors sitting, pain meds                             [facility] Adult PT Treatment/Exercise - 05/18/21 0001      Ambulation/Gait   Ambulation  Distance (Feet) 226 Feet    Assistive device Small based quad cane;None    Gait Comments 75% correct sequence 2 point; slow safe mechanics wihtout AD      Knee/Hip Exercises: Standing   Hip Flexion 20 reps    Hip Flexion Limitations standing marching (alternating), unilateral HHA    Forward Lunges 10 reps    Forward Lunges Limitations minimal HHA    Functional Squat 10 reps    Functional Squat Limitations front of chair, cueing for mechanics    SLS with Vectors 3x5" with HHA    Gait Training none x 226    Other Standing Knee Exercises partial tandem stance paloff 4x 15 woith GTB    Other Standing Knee Exercises minisquat side step 2RT                    PT Short Term Goals - 04/27/21 1620      PT SHORT TERM GOAL #1   Title PT to be I in a HEp to improve balance and strength to be able to state that she has  not fallen in the past 2 weeks.    Time 3    Period Weeks    Status On-going    Target Date 05/03/21      PT SHORT TERM GOAL #2   Title Pt to increase in core and LE strength as evident by being able to come sit to stand 8 x in 30 seconds    Time 3    Period Weeks    Status On-going      PT SHORT TERM GOAL #3   Title Balance to improve to be able to ambulate with single point cane 210 ft in a 2 mintues period of time    Time 3    Period Weeks    Status On-going             PT Long Term Goals - 04/27/21 1620      PT LONG TERM GOAL #1   Title Pt to be I in an advance HEP to be able to clean her house for 15 mintues without having to rest    Time 6    Period Weeks    Status On-going      PT LONG TERM GOAL #2   Title Pt to be able to single leg stance for 15 seconds on each leg to reduce risk of falling    Time 6    Period Weeks    Status On-going      PT LONG TERM GOAL #3   Title PT to be able to ambulate 250' in 2 mintues to demonstrate improved balance an functional activity tolerance.    Time 6    Period Weeks    Status On-going      PT LONG  TERM GOAL #4   Title PT core and LE strength increased one grade to allow pt to be able to come sit to stand from couches without use of UE assist    Time 6    Period Weeks    Status On-going                 Plan - 05/18/21 1543    Clinical Impression Statement Pt continues to have difficulty with sequence  though improving wiht 75% good sequence wiht cane, trial this session without AD and safe mechanics, just decreased cadence without AD.  Progressed core and proximal strengthening this session with good tolerance.  Added vector stance, squats and tandem stance to HEP, encouraged to complete infront of counter/sink for safety.  No reoprts of pain, was limited by fatigue.    Personal Factors and Comorbidities Fitness;Past/Current Experience;Time since onset of injury/illness/exacerbation;Social Background;Comorbidity 2    Comorbidities fibromyalgia, frequent falls, bipolar,    Examination-Activity Limitations Locomotion Level;Reach Overhead;Lift;Stairs;Stand    Examination-Participation Restrictions Cleaning;Community Activity;Occupation;Shop    Stability/Clinical Decision Making Evolving/Moderate complexity    Clinical Decision Making Moderate    Rehab Potential Good    PT Frequency 2x / week    PT Duration 6 weeks    PT Treatment/Interventions Patient/family education;Gait training;Stair training;Functional mobility training;Therapeutic activities;Therapeutic exercise;Balance training;Manual techniques    PT Next Visit Plan 10th visit progress note next session.  Advance HEP as appropriate.  Continue strength training and add balance challenges as able; work on transfers from floor when appropriate    PT Home Exercise Plan bridge; all 4 SLR; 4/19: STS/ 5/10: heel raises, side stepping.; 5/19: vector stance, tandem stance, squats with HHA    Consulted and Agree with Plan of Care Patient  Patient will benefit from skilled therapeutic intervention in order to improve the  following deficits and impairments:  Pain,Abnormal gait,Decreased activity tolerance,Decreased balance,Decreased strength,Difficulty walking  Visit Diagnosis: Muscle weakness (generalized)  History of falling  Difficulty in walking, not elsewhere classified     Problem List Patient Active Problem List   Diagnosis Date Noted  . Back pain, chronic 04/03/2013  . Anxiety 12/17/2012  . Vitamin D insufficiency 12/17/2012  . GERD (gastroesophageal reflux disease) 11/18/2012  . Bipolar 1 disorder ([place]) 11/18/2012  . Fibromyalgia 11/18/2012   Ihor Austin, LPTA/CLT; CBIS [phone]  Aldona Lento 05/18/2021, 6:25 PM  [facility] [address] [place], [place], [address] Phone: [phone]   Fax:  [phone]  Name: Katherine Middleton MRN: [id number] Date of Birth: [birth year]

## 2021-05-22 ENCOUNTER — Ambulatory Visit (HOSPITAL_COMMUNITY): Payer: Medicare HMO | Admitting: Physical Therapy

## 2021-05-22 ENCOUNTER — Other Ambulatory Visit: Payer: Self-pay

## 2021-05-22 DIAGNOSIS — M6281 Muscle weakness (generalized): Secondary | ICD-10-CM

## 2021-05-22 DIAGNOSIS — R262 Difficulty in walking, not elsewhere classified: Secondary | ICD-10-CM

## 2021-05-22 DIAGNOSIS — Z9181 History of falling: Secondary | ICD-10-CM

## 2021-05-22 NOTE — Therapy (Addendum)
[facility] [address] [place], [place], [address] Phone: [phone]   Fax:  [phone]  Physical Therapy Treatment  Patient Details  Name: Katherine Middleton MRN: [id number] Date of Birth: 1951-04-28 Referring Provider (PT): Bing Matter  Progress Note Reporting Period 04/12/2021  To 05/22/2021  See note below for Objective Data and Assessment of Progress/Goals.      Encounter Date: 05/22/2021   PT End of Session - 05/22/21 1521    Visit Number 10    Number of Visits 18    Date for PT Re-Evaluation 06/21/21    Authorization Type [employer] :12 visits approved; 8 more visists requested on 5/23    Authorization Time Period 4/13-->05/24/21    Authorization - Visit Number 10    Authorization - Number of Visits 12    Progress Note Due on Visit 18    PT Start Time 1453   PT late due to rain   PT Stop Time 1526    PT Time Calculation (min) 33 min    Equipment Utilized During Treatment --   SBA   Activity Tolerance Patient limited by fatigue;Patient tolerated treatment well    Behavior During Therapy WFL for tasks assessed/performed           Past Medical History:  Diagnosis Date  . Arthritis   . Bipolar disorder ([place])   . Fibromyalgia   . GERD (gastroesophageal reflux disease) 12/31/2002  . Osteoarthritis   . Sinusitis 12/31/2012    Past Surgical History:  Procedure Laterality Date  . TONSILLECTOMY  04/08/57   some date around then  . TUBAL LIGATION  10/21/1984    There were no vitals filed for this visit.   Subjective Assessment - 05/22/21 1457    Subjective PT states that she has made great improvement; she has been walking by herself.    Pertinent History fibromyalgia, bipolar, polypharmacy    Patient Stated Goals to be more stable    Currently in Pain? Yes    Pain Score 5     Pain Location Back    Pain Orientation Lower    Pain Descriptors / Indicators Aching;Dull    Pain Onset More than a month ago    Pain  Frequency Constant    Aggravating Factors  standing, walking    Pain Relieving Factors sitting and pain meds              [facility] PT Assessment - 05/22/21 0001      Assessment   Medical Diagnosis History of falls    Referring Provider (PT) Bing Matter    Onset Date/Surgical Date 04/26/21    Next MD Visit not scheduled    Prior Therapy none      Precautions   Precautions Fall      Restrictions   Weight Bearing Restrictions No      Prior Function   Level of Independence Independent with household mobility with device    Vocation Retired    Leisure watch TV, use to make [employer]   Overall Cognitive Status Within Functional Limits for tasks assessed      Functional Tests   Functional tests Single leg stance;Sit to Stand      Single Leg Stance   Comments Rt:14" was  5" Lt :14" was  4 "      Sit to Stand   Comments no hands; 6 in 30"; at eval must use hands :  5  in 30 seconds      Strength   Right Hip Flexion 5/5   was 4-   Right Hip Extension 4+/5   was 3/5   Right Hip ABduction 5/5   was 3+   Left Hip Flexion 5/5   was 4-   Left Hip Extension 4/5    Left Hip ABduction 4/5    Right Knee Flexion 4+/5    Right Knee Extension 5/5   was 4+   Left Knee Flexion 4+/5    Left Knee Extension 5/5   was 4+   Right Ankle Dorsiflexion 4/5   was 3/5   Left Ankle Dorsiflexion 4/5   was 3/5     Ambulation/Gait   Ambulation Distance (Feet) 210 Feet   was 113   Assistive device Small based quad cane    Gait Comments 2 minute walk test with one loss of balance                                   PT Short Term Goals - 05/22/21 1518      PT SHORT TERM GOAL #1   Title PT to be I in a HEp to improve balance and strength to be able to state that she has not fallen in the past 2 weeks.    Period Weeks    Status Achieved      PT SHORT TERM GOAL #2   Title Pt to increase in core and LE strength as evident by being able to come sit to stand 8  x in 30 seconds    Time 3    Period Weeks    Status Partially Met      PT SHORT TERM GOAL #3   Title Balance to improve to be able to ambulate with single point cane 210 ft in a 2 mintues period of time    Time 3    Period Weeks    Status Achieved             PT Long Term Goals - 05/22/21 1520      PT LONG TERM GOAL #1   Title Pt to be I in an advance HEP to be able to clean her house for 15 mintues without having to rest    Time 6    Period Weeks    Status Achieved      PT LONG TERM GOAL #2   Title Pt to be able to single leg stance for 15 seconds on each leg to reduce risk of falling    Time 6    Status Partially Met      PT LONG TERM GOAL #3   Title PT to be able to ambulate 250' in 2 mintues to demonstrate improved balance an functional activity tolerance.    Time 6    Period Weeks    Status Not Met      PT LONG TERM GOAL #4   Title PT core and LE strength increased one grade to allow pt to be able to come sit to stand from couches without use of UE assist    Time 6    Period Weeks    Status Partially Met   needs hands to push up from                Plan - 05/22/21 1531    Clinical Impression Statement Pt reassessed with noted improvement  in all aspects.  Pt much safer walking but continues to have decreased balance and decreased power in LE>  PT will continue to benefit from skilled PT focusing on these issues.    Personal Factors and Comorbidities Fitness;Past/Current Experience;Time since onset of injury/illness/exacerbation;Social Background;Comorbidity 2    Comorbidities fibromyalgia, frequent falls, bipolar,    Examination-Activity Limitations Locomotion Level;Reach Overhead;Lift;Stairs;Stand    Examination-Participation Restrictions Cleaning;Community Activity;Occupation;Shop    Stability/Clinical Decision Making Evolving/Moderate complexity    Rehab Potential Good    PT Frequency 2x / week    PT Duration 6 weeks   4 more weeks for a totoal of 10  weeks   PT Treatment/Interventions Patient/family education;Gait training;Stair training;Functional mobility training;Therapeutic activities;Therapeutic exercise;Balance training;Manual techniques    PT Next Visit Plan   Focus on  balance challenges and power  as able; work on transfers from floor when appropriate    PT Home Exercise Plan bridge; all 4 SLR; 4/19: STS/ 5/10: heel raises, side stepping.; 5/19: vector stance, tandem stance, squats with HHA    Consulted and Agree with Plan of Care Patient           Patient will benefit from skilled therapeutic intervention in order to improve the following deficits and impairments:  Pain,Abnormal gait,Decreased activity tolerance,Decreased balance,Decreased strength,Difficulty walking  Visit Diagnosis: History of falling  Muscle weakness (generalized)  Difficulty in walking, not elsewhere classified     Problem List Patient Active Problem List   Diagnosis Date Noted  . Back pain, chronic 04/03/2013  . Anxiety 12/17/2012  . Vitamin D insufficiency 12/17/2012  . GERD (gastroesophageal reflux disease) 11/18/2012  . Bipolar 1 disorder ([place]) 11/18/2012  . Fibromyalgia 11/18/2012    Rayetta Humphrey, PT CLT [phone] 05/22/2021, 3:35 PM  [facility] [address] [place], [place], [address] Phone: [phone]   Fax:  [phone]  Name: Katherine Middleton MRN: [id number] Date of Birth: [birth year]

## 2021-05-24 ENCOUNTER — Ambulatory Visit (HOSPITAL_COMMUNITY): Payer: Medicare HMO

## 2021-05-24 ENCOUNTER — Encounter (HOSPITAL_COMMUNITY): Payer: Self-pay

## 2021-05-24 ENCOUNTER — Other Ambulatory Visit: Payer: Self-pay

## 2021-05-24 VITALS — BP 128/64 | HR 65

## 2021-05-24 DIAGNOSIS — M6281 Muscle weakness (generalized): Secondary | ICD-10-CM | POA: Diagnosis not present

## 2021-05-24 DIAGNOSIS — R262 Difficulty in walking, not elsewhere classified: Secondary | ICD-10-CM

## 2021-05-24 DIAGNOSIS — Z9181 History of falling: Secondary | ICD-10-CM

## 2021-05-24 NOTE — Therapy (Signed)
[facility] [address] [place], [place], [address] Phone: [phone]   Fax:  [phone]  Physical Therapy Treatment  Patient Details  Name: Katherine Middleton MRN: [id number] Date of Birth: December 26, 1951 Referring Provider (PT): Bing Matter   Encounter Date: 05/24/2021   PT End of Session - 05/24/21 1547    Visit Number 11    Number of Visits 18    Date for PT Re-Evaluation 06/21/21    Authorization Type [employer] :12 visits approved; 8 more visists requested on 5/23    Authorization Time Period 4/13-->05/24/21    Progress Note Due on Visit 18    PT Start Time 1453    PT Stop Time 1535    PT Time Calculation (min) 42 min    Equipment Utilized During Treatment --   SBA; // bars within reach during balance activiites   Activity Tolerance Patient limited by fatigue;Patient tolerated treatment well    Behavior During Therapy [facility] for tasks assessed/performed           Past Medical History:  Diagnosis Date  . Arthritis   . Bipolar disorder ([place])   . Fibromyalgia   . GERD (gastroesophageal reflux disease) 12/31/2002  . Osteoarthritis   . Sinusitis 12/31/2012    Past Surgical History:  Procedure Laterality Date  . TONSILLECTOMY  04/08/57   some date around then  . TUBAL LIGATION  10/21/1984    Vitals:   05/24/21 1551  BP: 128/64  Pulse: 65     Subjective Assessment - 05/24/21 1458    Subjective Pt stated she is feeling good today, no reports of pain.  Has been walking around the house wihtout AD, no reports of recent fall of close call    Pertinent History fibromyalgia, bipolar, polypharmacy    Patient Stated Goals to be more stable    Currently in Pain? No/denies                             [facility] Adult PT Treatment/Exercise - 05/24/21 0001      Ambulation/Gait   Ambulation Distance (Feet) 200 Feet    Assistive device None    Gait Comments slow cadence, proper arm swing, no LOB      Knee/Hip Exercises:  Machines for Strengthening   Cybex Leg Press 1Pl 2x 10 slow and controlled      Knee/Hip Exercises: Standing   Forward Lunges 2 sets;5 reps    Forward Lunges Limitations posterior lunge with HHA    Functional Squat 10 reps    Functional Squat Limitations front of chair, cueing for mechanics    SLS Rt 7", Lt 13"    SLS with Vectors 3x5" with HHA    Other Standing Knee Exercises tandem stance with foam able to complete without HHA x5-7" 5 reps    Other Standing Knee Exercises minisquat side step 2RT                    PT Short Term Goals - 05/22/21 1518      PT SHORT TERM GOAL #1   Title PT to be I in a HEp to improve balance and strength to be able to state that she has not fallen in the past 2 weeks.    Period Weeks    Status Achieved      PT SHORT TERM GOAL #2   Title Pt to increase in core and LE strength  as evident by being able to come sit to stand 8 x in 30 seconds    Time 3    Period Weeks    Status Partially Met      PT SHORT TERM GOAL #3   Title Balance to improve to be able to ambulate with single point cane 210 ft in a 2 mintues period of time    Time 3    Period Weeks    Status Achieved             PT Long Term Goals - 05/22/21 1520      PT LONG TERM GOAL #1   Title Pt to be I in an advance HEP to be able to clean her house for 15 mintues without having to rest    Time 6    Period Weeks    Status Achieved      PT LONG TERM GOAL #2   Title Pt to be able to single leg stance for 15 seconds on each leg to reduce risk of falling    Time 6    Status Partially Met      PT LONG TERM GOAL #3   Title PT to be able to ambulate 250' in 2 mintues to demonstrate improved balance an functional activity tolerance.    Time 6    Period Weeks    Status Not Met      PT LONG TERM GOAL #4   Title PT core and LE strength increased one grade to allow pt to be able to come sit to stand from couches without use of UE assist    Time 6    Period Weeks    Status  Partially Met   needs hands to push up from                Plan - 05/24/21 1548    Clinical Impression Statement Session focus on advanced balance and functional strengthening to address weak gluteal mm.  Added foam wiht tandem stance that pt. found very challengening, able to hold stance for 5-7" top prior LOB requiring min A from therapist or HHA.  Pt reports some dizziness following dynamic surface, vitals checked WNL.    Personal Factors and Comorbidities Fitness;Past/Current Experience;Time since onset of injury/illness/exacerbation;Social Background;Comorbidity 2    Comorbidities fibromyalgia, frequent falls, bipolar,    Examination-Activity Limitations Locomotion Level;Reach Overhead;Lift;Stairs;Stand    Examination-Participation Restrictions Cleaning;Community Activity;Occupation;Shop    Stability/Clinical Decision Making Evolving/Moderate complexity    Clinical Decision Making Moderate    Rehab Potential Good    PT Frequency 2x / week    PT Duration 6 weeks   4 more weeks for total of 10 weeks   PT Treatment/Interventions Patient/family education;Gait training;Stair training;Functional mobility training;Therapeutic activities;Therapeutic exercise;Balance training;Manual techniques    PT Next Visit Plan Advance HEP as appropriate.  Focus on  balance challenges and power  as able; work on transfers from floor when appropriate    PT Home Exercise Plan bridge; all 4 SLR; 4/19: STS/ 5/10: heel raises, side stepping.; 5/19: vector stance, tandem stance, squats with HHA    Consulted and Agree with Plan of Care Patient           Patient will benefit from skilled therapeutic intervention in order to improve the following deficits and impairments:  Pain,Abnormal gait,Decreased activity tolerance,Decreased balance,Decreased strength,Difficulty walking  Visit Diagnosis: Muscle weakness (generalized)  History of falling  Difficulty in walking, not elsewhere  classified     Problem  List Patient Active Problem List   Diagnosis Date Noted  . Back pain, chronic 04/03/2013  . Anxiety 12/17/2012  . Vitamin D insufficiency 12/17/2012  . GERD (gastroesophageal reflux disease) 11/18/2012  . Bipolar 1 disorder ([place]) 11/18/2012  . Fibromyalgia 11/18/2012   Ihor Austin, LPTA/CLT; CBIS [phone]  Aldona Lento 05/24/2021, 3:53 PM  [facility] [address] [place], [place], [address] Phone: [phone]   Fax:  [phone]  Name: Katherine Middleton MRN: [id number] Date of Birth: Mar 31, 1951

## 2021-06-06 ENCOUNTER — Encounter (HOSPITAL_COMMUNITY): Payer: Self-pay | Admitting: Physical Therapy

## 2021-06-06 ENCOUNTER — Ambulatory Visit (HOSPITAL_COMMUNITY): Payer: Medicare HMO | Attending: Family Medicine | Admitting: Physical Therapy

## 2021-06-06 ENCOUNTER — Other Ambulatory Visit: Payer: Self-pay

## 2021-06-06 DIAGNOSIS — M6281 Muscle weakness (generalized): Secondary | ICD-10-CM | POA: Diagnosis not present

## 2021-06-06 DIAGNOSIS — Z9181 History of falling: Secondary | ICD-10-CM

## 2021-06-06 DIAGNOSIS — R262 Difficulty in walking, not elsewhere classified: Secondary | ICD-10-CM

## 2021-06-06 NOTE — Therapy (Signed)
[facility] [address] [place], [place], [address] Phone: [phone]   Fax:  [phone]  Physical Therapy Treatment  Patient Details  Name: Katherine Middleton MRN: [id number] Date of Birth: [birth year] Referring Provider (PT): Bing Matter   Encounter Date: 06/06/2021   PT End of Session - 06/06/21 1532    Visit Number 12    Number of Visits 18    Date for PT Re-Evaluation 06/21/21    Authorization Type [employer] :12 visits approved; 8 more visists requested on 5/23    Authorization Time Period 4/13-->05/24/21    Authorization - Visit Number 11    Authorization - Number of Visits 12    Progress Note Due on Visit 18    PT Start Time 1520    PT Stop Time 1545    PT Time Calculation (min) 25 min    Equipment Utilized During Treatment --   SBA; // bars within reach during balance activiites   Activity Tolerance Patient limited by fatigue;Patient tolerated treatment well    Behavior During Therapy [facility] for tasks assessed/performed           Past Medical History:  Diagnosis Date  . Arthritis   . Bipolar disorder ([place])   . Fibromyalgia   . GERD (gastroesophageal reflux disease) 12/31/2002  . Osteoarthritis   . Sinusitis 12/31/2012    Past Surgical History:  Procedure Laterality Date  . TONSILLECTOMY  04/08/57   some date around then  . TUBAL LIGATION  10/21/1984    There were no vitals filed for this visit.   Subjective Assessment - 06/06/21 1522    Subjective Pt reports that she has 7/10 pain in her back, no reports of falls. She reports having GI issues this morning, which she reports may have contributed to her fatigue.    Pertinent History fibromyalgia, bipolar, polypharmacy    Patient Stated Goals to be more stable    Pain Score 7     Pain Location Back    Pain Orientation Lower    Pain Descriptors / Indicators Aching;Dull    Pain Type Chronic pain                             [facility] Adult PT  Treatment/Exercise - 06/06/21 0001      Ambulation/Gait   Ambulation Distance (Feet) 224 Feet   in 2 minutes   Assistive device None      Knee/Hip Exercises: Standing   SLS decreased vs previous session, adjust to step stance on bosu x30 sec B.    Other Standing Knee Exercises toe taps on 6" box.      Knee/Hip Exercises: Seated   Other Seated Knee/Hip Exercises sitting with UE abduction and flexion while resting from increase in LBP.    Marching Strengthening;Both;3 sets;5 reps    Marching Limitations no back support                  PT Education - 06/06/21 1551    Education Details discussion regarding listening to her body for pain and fatigue and taking rest breaks when needed.    Person(s) Educated Patient    Methods Explanation    Comprehension Verbalized understanding            PT Short Term Goals - 05/22/21 1518      PT SHORT TERM GOAL #1   Title PT to be I in a HEp to  improve balance and strength to be able to state that she has not fallen in the past 2 weeks.    Period Weeks    Status Achieved      PT SHORT TERM GOAL #2   Title Pt to increase in core and LE strength as evident by being able to come sit to stand 8 x in 30 seconds    Time 3    Period Weeks    Status Partially Met      PT SHORT TERM GOAL #3   Title Balance to improve to be able to ambulate with single point cane 210 ft in a 2 mintues period of time    Time 3    Period Weeks    Status Achieved             PT Long Term Goals - 05/22/21 1520      PT LONG TERM GOAL #1   Title Pt to be I in an advance HEP to be able to clean her house for 15 mintues without having to rest    Time 6    Period Weeks    Status Achieved      PT LONG TERM GOAL #2   Title Pt to be able to single leg stance for 15 seconds on each leg to reduce risk of falling    Time 6    Status Partially Met      PT LONG TERM GOAL #3   Title PT to be able to ambulate 250' in 2 mintues to demonstrate improved  balance an functional activity tolerance.    Time 6    Period Weeks    Status Not Met      PT LONG TERM GOAL #4   Title PT core and LE strength increased one grade to allow pt to be able to come sit to stand from couches without use of UE assist    Time 6    Period Weeks    Status Partially Met   needs hands to push up from                Plan - 06/06/21 1552    Clinical Impression Statement Pt had a good session, especially initially with ambulating full loop around clinic without AD and CloseS in 2 minutes exactly, progressing toward her goal of 250 ft in 2 minutes. Demo increased pain in back during long duration step stance resulting in sitting rest break and some sitting exercises, where discuss listening to her body and pt reports GI distress this morning. Discussed food and electrolyte imbalances that may also contribute to fatigue secondary to GI distress. Able to complete toe taps on 6" box before increase fatigue and request to finish session early.    Personal Factors and Comorbidities Fitness;Past/Current Experience;Time since onset of injury/illness/exacerbation;Social Background;Comorbidity 2    Comorbidities fibromyalgia, frequent falls, bipolar,    Examination-Activity Limitations Locomotion Level;Reach Overhead;Lift;Stairs;Stand    Examination-Participation Restrictions Cleaning;Community Activity;Occupation;Shop    Stability/Clinical Decision Making Evolving/Moderate complexity    Rehab Potential Good    PT Frequency 2x / week    PT Duration 6 weeks   4 more weeks for a totoal of 10 weeks   PT Treatment/Interventions Patient/family education;Gait training;Stair training;Functional mobility training;Therapeutic activities;Therapeutic exercise;Balance training;Manual techniques    PT Next Visit Plan Advance HEP as appropriate.  Focus on  balance challenges and power  as able; work on transfers from floor when appropriate    PT Home Exercise  Plan bridge; all 4 SLR;  4/19: STS/ 5/10: heel raises, side stepping.; 5/19: vector stance, tandem stance, squats with HHA    Consulted and Agree with Plan of Care Patient           Patient will benefit from skilled therapeutic intervention in order to improve the following deficits and impairments:  Pain,Abnormal gait,Decreased activity tolerance,Decreased balance,Decreased strength,Difficulty walking  Visit Diagnosis: Muscle weakness (generalized)  History of falling  Difficulty in walking, not elsewhere classified     Problem List Patient Active Problem List   Diagnosis Date Noted  . Back pain, chronic 04/03/2013  . Anxiety 12/17/2012  . Vitamin D insufficiency 12/17/2012  . GERD (gastroesophageal reflux disease) 11/18/2012  . Bipolar 1 disorder ([place]) 11/18/2012  . Fibromyalgia 11/18/2012    3:56 PM,06/06/21 Domenic Moras, PT, DPT Physical Therapist at [facility] [place], [place], [address] Phone: [phone]   Fax:  [phone]  Name: Katherine Middleton MRN: [id number] Date of Birth: 06/03/1951

## 2021-06-08 ENCOUNTER — Ambulatory Visit (HOSPITAL_COMMUNITY): Payer: Medicare HMO

## 2021-06-08 ENCOUNTER — Encounter (HOSPITAL_COMMUNITY): Payer: Self-pay

## 2021-06-08 ENCOUNTER — Other Ambulatory Visit: Payer: Self-pay

## 2021-06-08 DIAGNOSIS — Z9181 History of falling: Secondary | ICD-10-CM

## 2021-06-08 DIAGNOSIS — M6281 Muscle weakness (generalized): Secondary | ICD-10-CM | POA: Diagnosis not present

## 2021-06-08 DIAGNOSIS — R262 Difficulty in walking, not elsewhere classified: Secondary | ICD-10-CM

## 2021-06-08 NOTE — Therapy (Signed)
[place], [place], [address] Phone: [phone]   Fax:  [phone]  Physical Therapy Treatment  Patient Details  Name: Katherine Middleton MRN: [id number] Date of Birth: [birth year] Referring Provider (PT): Bing Matter   Encounter Date: 06/08/2021   PT End of Session - 06/08/21 1451     Visit Number 13    Number of Visits 18    Date for PT Re-Evaluation 06/21/21    Authorization Type [employer] :12 visits approved; 9 visists approved    Authorization Time Period 5/24-->06/29/21    Authorization - Visit Number 3    Authorization - Number of Visits 9    Progress Note Due on Visit 18    PT Start Time 1448    PT Stop Time 1526    PT Time Calculation (min) 38 min    Equipment Utilized During Treatment --   SBA; // within reach as needed for balance activiites   Activity Tolerance Patient limited by fatigue;Patient tolerated treatment well    Behavior During Therapy [facility] for tasks assessed/performed             Past Medical History:  Diagnosis Date   Arthritis    Bipolar disorder ([place])    Fibromyalgia    GERD (gastroesophageal reflux disease) 12/31/2002   Osteoarthritis    Sinusitis 12/31/2012    Past Surgical History:  Procedure Laterality Date   TONSILLECTOMY  04/08/57   some date around then   TUBAL LIGATION  10/21/1984    There were no vitals filed for this visit.   Subjective Assessment - 06/08/21 1450     Subjective Pt stated she continues to have some LBP 7/10, reports she has began walking more without AD.    Pertinent History fibromyalgia, bipolar, polypharmacy    Patient Stated Goals to be more stable    Currently in Pain? Yes    Pain Score 7     Pain Location Back    Pain Orientation Lower    Pain Descriptors / Indicators Dull    Pain Type Chronic pain    Pain Onset More than a month ago    Pain Frequency Constant    Aggravating Factors  standing, walking    Pain Relieving Factors sitting  and pain meds    Effect of Pain on Daily Activities limits                               [facility] Adult PT Treatment/Exercise - 06/08/21 0001       Ambulation/Gait   Ambulation Distance (Feet) 272 Feet   2MWT   Assistive device None    Gait Comments 2MWT; slow and safe cadence      Knee/Hip Exercises: Machines for Strengthening   Other Machine Retro/forward gait; sidestep with 1 Pl 1x each      Knee/Hip Exercises: Standing   Heel Raises Both;2 sets;10 reps    Heel Raises Limitations squat then heel raise wiht intermittent HHA    Forward Lunges 10 reps    Functional Squat 2 sets;10 reps    Functional Squat Limitations squat then heel raise wiht intermittent HHA    SLS 5x max    Other Standing Knee Exercises toe tapping forward and lateral on 6in step    Other Standing Knee Exercises tandem stance with one foot on BOSU 3x 30"  PT Short Term Goals - 05/22/21 1518       PT SHORT TERM GOAL #1   Title PT to be I in a HEp to improve balance and strength to be able to state that she has not fallen in the past 2 weeks.    Period Weeks    Status Achieved      PT SHORT TERM GOAL #2   Title Pt to increase in core and LE strength as evident by being able to come sit to stand 8 x in 30 seconds    Time 3    Period Weeks    Status Partially Met      PT SHORT TERM GOAL #3   Title Balance to improve to be able to ambulate with single point cane 210 ft in a 2 mintues period of time    Time 3    Period Weeks    Status Achieved               PT Long Term Goals - 05/22/21 1520       PT LONG TERM GOAL #1   Title Pt to be I in an advance HEP to be able to clean her house for 15 mintues without having to rest    Time 6    Period Weeks    Status Achieved      PT LONG TERM GOAL #2   Title Pt to be able to single leg stance for 15 seconds on each leg to reduce risk of falling    Time 6    Status Partially Met      PT LONG TERM  GOAL #3   Title PT to be able to ambulate 250' in 2 mintues to demonstrate improved balance an functional activity tolerance.    Time 6    Period Weeks    Status Not Met      PT LONG TERM GOAL #4   Title PT core and LE strength increased one grade to allow pt to be able to come sit to stand from couches without use of UE assist    Time 6    Period Weeks    Status Partially Met   needs hands to push up from                  Plan - 06/08/21 1531     Clinical Impression Statement Session focus with functional strengthening, balance training and improving activity tolerance.  Began session with 2MWT wiht ability to ambulate 272 ft, no AD and no LOB during.  Continues to ambulate slow safe cadence.  Pt continues to fatigue quickly required seated rest breaks through session.  Added combo squat then heel raise for power and balance wiht intermittent HHA required with LOB during heel raise.  No reports of increased pain.  Pt stated she continues to loose weight, may benefit from discussion with healthy nutrition diet.    Personal Factors and Comorbidities Fitness;Past/Current Experience;Time since onset of injury/illness/exacerbation;Social Background;Comorbidity 2    Comorbidities fibromyalgia, frequent falls, bipolar,    Examination-Activity Limitations Locomotion Level;Reach Overhead;Lift;Stairs;Stand    Examination-Participation Restrictions Cleaning;Community Activity;Occupation;Shop    Stability/Clinical Decision Making Evolving/Moderate complexity    Clinical Decision Making Moderate    Rehab Potential Good    PT Frequency 2x / week    PT Duration 6 weeks   4 more week wiht total of 10 weeks   PT Treatment/Interventions Patient/family education;Gait training;Stair training;Functional mobility training;Therapeutic activities;Therapeutic exercise;Balance training;Manual techniques  PT Next Visit Plan Advance HEP as appropriate.  Focus on  balance challenges and power  as able;  work on transfers from floor when appropriate.  Possibly discuss healthy diet as pt continues to reports weight loss.    PT Home Exercise Plan bridge; all 4 SLR; 4/19: STS/ 5/10: heel raises, side stepping.; 5/19: vector stance, tandem stance, squats with HHA    Consulted and Agree with Plan of Care Patient             Patient will benefit from skilled therapeutic intervention in order to improve the following deficits and impairments:  Pain, Abnormal gait, Decreased activity tolerance, Decreased balance, Decreased strength, Difficulty walking  Visit Diagnosis: Muscle weakness (generalized)  History of falling  Difficulty in walking, not elsewhere classified     Problem List Patient Active Problem List   Diagnosis Date Noted   Back pain, chronic 04/03/2013   Anxiety 12/17/2012   Vitamin D insufficiency 12/17/2012   GERD (gastroesophageal reflux disease) 11/18/2012   Bipolar 1 disorder ([place]) 11/18/2012   Fibromyalgia 11/18/2012   Ihor Austin, LPTA/CLT; CBIS [phone]  Aldona Lento 06/08/2021, 3:37 PM  [place], [place], [address] Phone: [phone]   Fax:  [phone]  Name: Katherine Middleton MRN: [id number] Date of Birth: [birth year]

## 2021-06-13 ENCOUNTER — Other Ambulatory Visit: Payer: Self-pay

## 2021-06-13 ENCOUNTER — Ambulatory Visit (HOSPITAL_COMMUNITY): Payer: Medicare HMO | Admitting: Physical Therapy

## 2021-06-13 ENCOUNTER — Encounter (HOSPITAL_COMMUNITY): Payer: Self-pay | Admitting: Physical Therapy

## 2021-06-13 DIAGNOSIS — M6281 Muscle weakness (generalized): Secondary | ICD-10-CM | POA: Diagnosis not present

## 2021-06-13 DIAGNOSIS — R262 Difficulty in walking, not elsewhere classified: Secondary | ICD-10-CM

## 2021-06-13 DIAGNOSIS — Z9181 History of falling: Secondary | ICD-10-CM

## 2021-06-13 NOTE — Therapy (Signed)
[place], [place], [address] Phone: [phone]   Fax:  [phone]  Physical Therapy Treatment  Patient Details  Name: Katherine Middleton MRN: [id number] Date of Birth: 1951-10-18 Referring Provider (PT): Bing Matter   Encounter Date: 06/13/2021   PT End of Session - 06/13/21 1428     Visit Number 14    Number of Visits 18    Date for PT Re-Evaluation 06/21/21    Authorization Type [employer] :12 visits approved; 9 visists approved    Authorization Time Period 5/24-->06/29/21    Authorization - Visit Number 4    Authorization - Number of Visits 9    Progress Note Due on Visit 18    PT Start Time 1435    PT Stop Time 1515    PT Time Calculation (min) 40 min    Equipment Utilized During Treatment --   SBA; // within reach as needed for balance activiites   Activity Tolerance Patient limited by fatigue;Patient tolerated treatment well    Behavior During Therapy [facility] for tasks assessed/performed             Past Medical History:  Diagnosis Date   Arthritis    Bipolar disorder ([place])    Fibromyalgia    GERD (gastroesophageal reflux disease) 12/31/2002   Osteoarthritis    Sinusitis 12/31/2012    Past Surgical History:  Procedure Laterality Date   TONSILLECTOMY  04/08/57   some date around then   TUBAL LIGATION  10/21/1984    There were no vitals filed for this visit.   Subjective Assessment - 06/13/21 1444     Subjective States that  her balance is getting better and is getting stronger. States she has pain in her back and ribs and it is rated as 6/10. States her exercises are going well at home.    Pertinent History fibromyalgia, bipolar, polypharmacy    Patient Stated Goals to be more stable    Currently in Pain? Yes    Pain Score 6     Pain Location Back    Pain Orientation Right    Pain Descriptors / Indicators Aching    Pain Onset More than a month ago                [facility] PT Assessment -  06/13/21 0001       Assessment   Medical Diagnosis History of falls    Referring Provider (PT) Bing Matter                           [facility] Adult PT Treatment/Exercise - 06/13/21 0001       Self-Care   Self-Care Other Self-Care Comments    Other Self-Care Comments  current diet review with suggestions: 2 pop-tarts, jimmy dean sausage biscuit with cheese, special K bars, drinks ensure 2x/day with goal of 3x/day, eating meals at lunch and dinner.      Knee/Hip Exercises: Standing   Other Standing Knee Exercises narrow base of support on floor with cervical Rotation x5 bilateral --> then on blue foam 3x5 bilaterally; tandem walking in // bars x4 laps in bars with occasional min guard assist      Knee/Hip Exercises: Seated   Long Arc Quad Both;10 reps;3 sets   as active rest break between sit to stands   Sit to [employer] 10 reps;without UE support   holdig onto red ball 3 sets  PT Education - 06/13/21 1445     Education Details about nutrition and current dietary needs, about following up with MD secondary to weight loss, never having CT scan and making sure she is still not unintentionally losing weight.    Person(s) Educated Patient    Methods Explanation    Comprehension Verbalized understanding              PT Short Term Goals - 05/22/21 1518       PT SHORT TERM GOAL #1   Title PT to be I in a HEp to improve balance and strength to be able to state that she has not fallen in the past 2 weeks.    Period Weeks    Status Achieved      PT SHORT TERM GOAL #2   Title Pt to increase in core and LE strength as evident by being able to come sit to stand 8 x in 30 seconds    Time 3    Period Weeks    Status Partially Met      PT SHORT TERM GOAL #3   Title Balance to improve to be able to ambulate with single point cane 210 ft in a 2 mintues period of time    Time 3    Period Weeks    Status Achieved               PT Long  Term Goals - 05/22/21 1520       PT LONG TERM GOAL #1   Title Pt to be I in an advance HEP to be able to clean her house for 15 mintues without having to rest    Time 6    Period Weeks    Status Achieved      PT LONG TERM GOAL #2   Title Pt to be able to single leg stance for 15 seconds on each leg to reduce risk of falling    Time 6    Status Partially Met      PT LONG TERM GOAL #3   Title PT to be able to ambulate 250' in 2 mintues to demonstrate improved balance an functional activity tolerance.    Time 6    Period Weeks    Status Not Met      PT LONG TERM GOAL #4   Title PT core and LE strength increased one grade to allow pt to be able to come sit to stand from couches without use of UE assist    Time 6    Period Weeks    Status Partially Met   needs hands to push up from                  Plan - 06/13/21 1459     Clinical Impression Statement Discussed current diet and possible ways to increase overall calories to reduce unintentional weight loss (MD aware of weight loss). Long rest breaks taken between exercise sand with history as talking patient easily became out of breath. Suggested patient to follow up with MD secondary to never having CT scan and concerns about continued weight loss. Fatigue noted in legs end of session. Will continue with current POC as tolerated.    Personal Factors and Comorbidities Fitness;Past/Current Experience;Time since onset of injury/illness/exacerbation;Social Background;Comorbidity 2    Comorbidities fibromyalgia, frequent falls, bipolar,    Examination-Activity Limitations Locomotion Level;Reach Overhead;Lift;Stairs;Stand    Examination-Participation Restrictions Cleaning;Community Activity;Occupation;Shop    Stability/Clinical Decision Making Evolving/Moderate complexity  Rehab Potential Good    PT Frequency 2x / week    PT Duration 6 weeks   4 more week wiht total of 10 weeks   PT Treatment/Interventions Patient/family  education;Gait training;Stair training;Functional mobility training;Therapeutic activities;Therapeutic exercise;Balance training;Manual techniques;ADLs/Self Care Home Management    PT Next Visit Plan Advance HEP as appropriate.  Focus on  balance challenges and power  as able; work on transfers from floor when appropriate.    PT Home Exercise Plan bridge; all 4 SLR; 4/19: STS/ 5/10: heel raises, side stepping.; 5/19: vector stance, tandem stance, squats with HHA    Consulted and Agree with Plan of Care Patient             Patient will benefit from skilled therapeutic intervention in order to improve the following deficits and impairments:  Pain, Abnormal gait, Decreased activity tolerance, Decreased balance, Decreased strength, Difficulty walking  Visit Diagnosis: Muscle weakness (generalized)  History of falling  Difficulty in walking, not elsewhere classified     Problem List Patient Active Problem List   Diagnosis Date Noted   Back pain, chronic 04/03/2013   Anxiety 12/17/2012   Vitamin D insufficiency 12/17/2012   GERD (gastroesophageal reflux disease) 11/18/2012   Bipolar 1 disorder ([place]) 11/18/2012   Fibromyalgia 11/18/2012    3:22 PM, 06/13/21 Jerene Pitch, DPT Physical Therapy with [facility]  [phone] office   [place], [place], [address] Phone: [phone]   Fax:  [phone]  Name: Katherine Middleton MRN: [id number] Date of Birth: [birth year]

## 2021-06-15 ENCOUNTER — Other Ambulatory Visit: Payer: Self-pay

## 2021-06-15 ENCOUNTER — Ambulatory Visit (HOSPITAL_COMMUNITY): Payer: Medicare HMO | Admitting: Physical Therapy

## 2021-06-15 ENCOUNTER — Encounter (HOSPITAL_COMMUNITY): Payer: Self-pay | Admitting: Physical Therapy

## 2021-06-15 DIAGNOSIS — R262 Difficulty in walking, not elsewhere classified: Secondary | ICD-10-CM

## 2021-06-15 DIAGNOSIS — Z9181 History of falling: Secondary | ICD-10-CM

## 2021-06-15 DIAGNOSIS — M6281 Muscle weakness (generalized): Secondary | ICD-10-CM | POA: Diagnosis not present

## 2021-06-15 NOTE — Therapy (Signed)
[place], [place], [address] Phone: [phone]   Fax:  [phone]  Physical Therapy Treatment  Patient Details  Name: Katherine Middleton MRN: [id number] Date of Birth: September 13, 1951 Referring Provider (PT): Bing Matter   Encounter Date: 06/15/2021   PT End of Session - 06/15/21 1444     Visit Number 15    Number of Visits 18    Date for PT Re-Evaluation 06/21/21    Authorization Type [employer] :12 visits approved; 9 visists approved    Authorization Time Period 5/24-->06/29/21    Authorization - Visit Number 5    Authorization - Number of Visits 9    Progress Note Due on Visit 18    PT Start Time 3875    PT Stop Time 1525    PT Time Calculation (min) 40 min    Equipment Utilized During Treatment --   SBA; // within reach as needed for balance activiites   Activity Tolerance Patient limited by fatigue;Patient tolerated treatment well    Behavior During Therapy [facility] for tasks assessed/performed             Past Medical History:  Diagnosis Date   Arthritis    Bipolar disorder ([place])    Fibromyalgia    GERD (gastroesophageal reflux disease) 12/31/2002   Osteoarthritis    Sinusitis 12/31/2012    Past Surgical History:  Procedure Laterality Date   TONSILLECTOMY  04/08/57   some date around then   TUBAL LIGATION  10/21/1984    There were no vitals filed for this visit.   Subjective Assessment - 06/15/21 1451     Subjective States heel has been sore since last session. Hasn't called Md about f/u apt yet.    Pertinent History fibromyalgia, bipolar, polypharmacy    Patient Stated Goals to be more stable    Currently in Pain? Yes    Pain Score 4     Pain Location Heel    Pain Orientation Right    Pain Descriptors / Indicators Sore    Pain Onset More than a month ago                [facility] PT Assessment - 06/15/21 0001       Assessment   Medical Diagnosis History of falls    Referring Provider  (PT) Bing Matter                           [facility] Adult PT Treatment/Exercise - 06/15/21 0001       Knee/Hip Exercises: Standing   Forward Step Up 4 sets;5 reps;Hand Hold: 2;Both;Step Height: 6"    Other Standing Knee Exercises in // bars marching forward then backwards walking - 2x5 with SBA and occassional assist from bars.    Other Standing Knee Exercises staggered stance on step 4" with head turns 4x5 bilateral      Knee/Hip Exercises: Seated   Sit to Sand 10 reps;without UE support   holding red ball 3 sets                     PT Short Term Goals - 05/22/21 1518       PT SHORT TERM GOAL #1   Title PT to be I in a HEp to improve balance and strength to be able to state that she has not fallen in the past 2 weeks.    Period Weeks  Status Achieved      PT SHORT TERM GOAL #2   Title Pt to increase in core and LE strength as evident by being able to come sit to stand 8 x in 30 seconds    Time 3    Period Weeks    Status Partially Met      PT SHORT TERM GOAL #3   Title Balance to improve to be able to ambulate with single point cane 210 ft in a 2 mintues period of time    Time 3    Period Weeks    Status Achieved               PT Long Term Goals - 05/22/21 1520       PT LONG TERM GOAL #1   Title Pt to be I in an advance HEP to be able to clean her house for 15 mintues without having to rest    Time 6    Period Weeks    Status Achieved      PT LONG TERM GOAL #2   Title Pt to be able to single leg stance for 15 seconds on each leg to reduce risk of falling    Time 6    Status Partially Met      PT LONG TERM GOAL #3   Title PT to be able to ambulate 250' in 2 mintues to demonstrate improved balance an functional activity tolerance.    Time 6    Period Weeks    Status Not Met      PT LONG TERM GOAL #4   Title PT core and LE strength increased one grade to allow pt to be able to come sit to stand from couches without use of UE  assist    Time 6    Period Weeks    Status Partially Met   needs hands to push up from                  Plan - 06/15/21 1524     Clinical Impression Statement Reassured patient about typical soreness noted in legs/feet after exercises. Continued with functional strengthening which was tolerated moderately well but patient did require longer rest breaks secondary to fatigue. Added marching forward today which was challenging for patient and she required min assist from PT and bars to regain balance at times. Reported dizziness after second set on 4th rep so patient was cued to sit and rest. Ended session secondary to fatigue and dizziness.    Personal Factors and Comorbidities Fitness;Past/Current Experience;Time since onset of injury/illness/exacerbation;Social Background;Comorbidity 2    Comorbidities fibromyalgia, frequent falls, bipolar,    Examination-Activity Limitations Locomotion Level;Reach Overhead;Lift;Stairs;Stand    Examination-Participation Restrictions Cleaning;Community Activity;Occupation;Shop    Stability/Clinical Decision Making Evolving/Moderate complexity    Rehab Potential Good    PT Frequency 2x / week    PT Duration 6 weeks   4 more week wiht total of 10 weeks   PT Treatment/Interventions Patient/family education;Gait training;Stair training;Functional mobility training;Therapeutic activities;Therapeutic exercise;Balance training;Manual techniques;ADLs/Self Care Home Management    PT Next Visit Plan Advance HEP as appropriate.  Focus on  balance challenges and power  as able; work on transfers from floor when appropriate.    PT Home Exercise Plan bridge; all 4 SLR; 4/19: STS/ 5/10: heel raises, side stepping.; 5/19: vector stance, tandem stance, squats with HHA    Consulted and Agree with Plan of Care Patient  Patient will benefit from skilled therapeutic intervention in order to improve the following deficits and impairments:  Pain, Abnormal  gait, Decreased activity tolerance, Decreased balance, Decreased strength, Difficulty walking  Visit Diagnosis: Muscle weakness (generalized)  History of falling  Difficulty in walking, not elsewhere classified     Problem List Patient Active Problem List   Diagnosis Date Noted   Back pain, chronic 04/03/2013   Anxiety 12/17/2012   Vitamin D insufficiency 12/17/2012   GERD (gastroesophageal reflux disease) 11/18/2012   Bipolar 1 disorder ([place]) 11/18/2012   Fibromyalgia 11/18/2012   3:25 PM, 06/15/21 Jerene Pitch, DPT Physical Therapy with [facility]  [phone] office   [place], [place], [address] Phone: [phone]   Fax:  [phone]  Name: Katherine Middleton MRN: [id number] Date of Birth: [birth year]

## 2021-06-20 ENCOUNTER — Ambulatory Visit (HOSPITAL_COMMUNITY): Payer: Medicare HMO | Admitting: Physical Therapy

## 2021-06-20 ENCOUNTER — Encounter (HOSPITAL_COMMUNITY): Payer: Self-pay | Admitting: Physical Therapy

## 2021-06-20 ENCOUNTER — Other Ambulatory Visit: Payer: Self-pay

## 2021-06-20 DIAGNOSIS — Z9181 History of falling: Secondary | ICD-10-CM

## 2021-06-20 DIAGNOSIS — M6281 Muscle weakness (generalized): Secondary | ICD-10-CM | POA: Diagnosis not present

## 2021-06-20 DIAGNOSIS — R262 Difficulty in walking, not elsewhere classified: Secondary | ICD-10-CM

## 2021-06-20 NOTE — Therapy (Signed)
[facility] [address] [place], [place], [address] Phone: [phone]   Fax:  [phone]  Physical Therapy Treatment and Progress Note and Discharge note  Patient Details  Name: Katherine Middleton MRN: [id number] Date of Birth: [birth year] Referring Provider (PT): Bing Matter   PHYSICAL THERAPY DISCHARGE SUMMARY  Visits from Start of Care: 16  Current functional level related to goals / functional outcomes: See below   Remaining deficits: See below   Education / Equipment: See below   Patient agrees to discharge. Patient goals were partially met. Patient is being discharged due to being pleased with the current functional level.     Progress Note Reporting Period 05/22/21 to 06/01/21  See note below for Objective Data and Assessment of Progress/Goals.      Encounter Date: 06/20/2021   PT End of Session - 06/20/21 1446     Visit Number 16    Number of Visits 18    Date for PT Re-Evaluation 06/21/21    Authorization Type [employer] :12 visits approved; 9 visists approved    Authorization Time Period 5/24-->06/29/21    Authorization - Visit Number 6    Authorization - Number of Visits 9    Progress Note Due on Visit 18    PT Start Time 1446    PT Stop Time 1510    PT Time Calculation (min) 24 min    Equipment Utilized During Treatment --   SBA; // within reach as needed for balance activiites   Activity Tolerance Patient limited by fatigue;Patient tolerated treatment well    Behavior During Therapy [facility] for tasks assessed/performed             Past Medical History:  Diagnosis Date   Arthritis    Bipolar disorder ([place])    Fibromyalgia    GERD (gastroesophageal reflux disease) 12/31/2002   Osteoarthritis    Sinusitis 12/31/2012    Past Surgical History:  Procedure Laterality Date   TONSILLECTOMY  04/08/57   some date around then   TUBAL LIGATION  10/21/1984    There were no vitals filed for this visit.    Subjective Assessment - 06/20/21 1451     Subjective Reports her right knee is a little painful today reporting 6/10 pain currently.    Pertinent History fibromyalgia, bipolar, polypharmacy    Patient Stated Goals to be more stable    Currently in Pain? Yes    Pain Score 6     Pain Location Knee    Pain Orientation Right    Pain Descriptors / Indicators Throbbing    Pain Onset More than a month ago                [facility] PT Assessment - 06/20/21 0001       Assessment   Medical Diagnosis History of falls    Referring Provider (PT) Bing Matter      Single Leg Stance   Comments 8 seconds on either leg no UE support      Strength   Right Hip Flexion 5/5    Left Hip Flexion 5/5    Right Knee Flexion 4+/5    Right Knee Extension 5/5    Left Knee Flexion 4+/5    Left Knee Extension 5/5    Right Ankle Dorsiflexion 4/5    Left Ankle Dorsiflexion 4/5      Ambulation/Gait   Ambulation Distance (Feet) 314 Feet    Assistive device None    Ambulation  Surface Level;Indoor    Gait Comments 2MW                                   PT Education - 06/20/21 1517     Education Details on progress made, HEP, current functional presention and plan moving forward    Person(s) Educated Patient    Methods Explanation    Comprehension Verbalized understanding              PT Short Term Goals - 06/20/21 1453       PT SHORT TERM GOAL #1   Title PT to be I in a HEp to improve balance and strength to be able to state that she has not fallen in the past 2 weeks.    Period Weeks    Status Achieved      PT SHORT TERM GOAL #2   Title Pt to increase in core and LE strength as evident by being able to come sit to stand 8 x in 30 seconds    Baseline 9 in 30 seconds    Time 3    Period Weeks    Status Achieved      PT SHORT TERM GOAL #3   Title Balance to improve to be able to ambulate with single point cane 210 ft in a 2 mintues period of time    Time 3     Period Weeks    Status Achieved               PT Long Term Goals - 06/20/21 1453       PT LONG TERM GOAL #1   Title Pt to be I in an advance HEP to be able to clean her house for 15 minutes without having to rest    Baseline 15 minutes    Time 6    Period Weeks    Status Achieved      PT LONG TERM GOAL #2   Title Pt to be able to single leg stance for 15 seconds on each leg to reduce risk of falling    Baseline 8 seconds on either leg no UE support    Time 6    Status On-going      PT LONG TERM GOAL #3   Title PT to be able to ambulate 250' in 2 mintues to demonstrate improved balance an functional activity tolerance.    Baseline 314 no AD    Time 6    Period Weeks    Status Achieved      PT LONG TERM GOAL #4   Title PT core and LE strength increased one grade to allow pt to be able to come sit to stand from couches without use of UE assist    Baseline still having difficulty rising from couch but increased MMT    Time 6    Period Weeks    Status Partially Met   needs hands to push up from                  Plan - 06/20/21 1507     Clinical Impression Statement All short term goals met a this time. All but 2 long term goals met at this time. Reviewed HEP and continued balance deficits. Discussed using cane when she feels week and when she is out in the community. Answered all questions, patient to discharge from PT to HEP at  this time secondary to progress made and independence in HEP.    Personal Factors and Comorbidities Fitness;Past/Current Experience;Time since onset of injury/illness/exacerbation;Social Background;Comorbidity 2    Comorbidities fibromyalgia, frequent falls, bipolar,    Examination-Activity Limitations Locomotion Level;Reach Overhead;Lift;Stairs;Stand    Examination-Participation Restrictions Cleaning;Community Activity;Occupation;Shop    Stability/Clinical Decision Making Evolving/Moderate complexity    Rehab Potential Good    PT  Frequency 2x / week    PT Duration 6 weeks   4 more week wiht total of 10 weeks   PT Treatment/Interventions Patient/family education;Gait training;Stair training;Functional mobility training;Therapeutic activities;Therapeutic exercise;Balance training;Manual techniques;ADLs/Self Care Home Management    PT Next Visit Plan DC to HEP    PT Home Exercise Plan bridge; all 4 SLR; 4/19: STS/ 5/10: heel raises, side stepping.; 5/19: vector stance, tandem stance, squats with HHA    Consulted and Agree with Plan of Care Patient             Patient will benefit from skilled therapeutic intervention in order to improve the following deficits and impairments:  Pain, Abnormal gait, Decreased activity tolerance, Decreased balance, Decreased strength, Difficulty walking  Visit Diagnosis: Muscle weakness (generalized)  History of falling  Difficulty in walking, not elsewhere classified     Problem List Patient Active Problem List   Diagnosis Date Noted   Back pain, chronic 04/03/2013   Anxiety 12/17/2012   Vitamin D insufficiency 12/17/2012   GERD (gastroesophageal reflux disease) 11/18/2012   Bipolar 1 disorder ([place]) 11/18/2012   Fibromyalgia 11/18/2012    3:20 PM, 06/20/21 Jerene Pitch, DPT Physical Therapy with [facility]  [phone] office   [place], [place], [address] Phone: [phone]   Fax:  [phone]  Name: Albana Saperstein MRN: [id number] Date of Birth: December 26, 1951

## 2021-06-22 ENCOUNTER — Ambulatory Visit (HOSPITAL_COMMUNITY): Payer: Medicare HMO | Admitting: Physical Therapy

## 2021-06-26 ENCOUNTER — Ambulatory Visit: Payer: Medicare HMO | Admitting: Neurology

## 2021-06-27 ENCOUNTER — Encounter (HOSPITAL_COMMUNITY): Payer: Medicare HMO | Admitting: Physical Therapy

## 2021-07-04 ENCOUNTER — Ambulatory Visit: Payer: Medicare HMO | Admitting: Neurology

## 2021-07-04 ENCOUNTER — Other Ambulatory Visit: Payer: Self-pay

## 2021-07-04 ENCOUNTER — Encounter: Payer: Self-pay | Admitting: Neurology

## 2021-07-04 VITALS — BP 116/37 | HR 50 | Wt 97.4 lb

## 2021-07-04 DIAGNOSIS — G3184 Mild cognitive impairment, so stated: Secondary | ICD-10-CM | POA: Diagnosis not present
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
## 2021-07-04 NOTE — Patient Instructions (Signed)
Schedule Neurocognitive testing  2. Discuss thyroid concerns and CT chest/abdomen with your PCP  3. Follow-up in 6 months, call for any changes    FALL PRECAUTIONS: Be cautious when walking. Scan the area for obstacles that may increase the risk of trips and falls. When getting up in the mornings, sit up at the edge of the bed for a few minutes before getting out of bed. Consider elevating the bed at the head end to avoid drop of blood pressure when getting up. Walk always in a well-lit room (use night lights in the walls). Avoid area rugs or power cords from appliances in the middle of the walkways. Use a walker or a cane if necessary and consider physical therapy for balance exercise. Get your eyesight checked regularly.  FINANCIAL OVERSIGHT: Supervision, especially oversight when making financial decisions or transactions is also recommended.  HOME SAFETY: Consider the safety of the kitchen when operating appliances like stoves, microwave oven, and blender. Consider having supervision and share cooking responsibilities until no longer able to participate in those. Accidents with firearms and other hazards in the house should be identified and addressed as well.  DRIVING: Regarding driving, in patients with progressive memory problems, driving will be impaired. We advise to have someone else do the driving if trouble finding directions or if minor accidents are reported. Independent driving assessment is available to determine safety of driving.  ABILITY TO BE LEFT ALONE: If patient is unable to contact 911 operator, consider using LifeLine, or when the need is there, arrange for someone to stay with patients. Smoking is a fire hazard, consider supervision or cessation. Risk of wandering should be assessed by caregiver and if detected at any point, supervision and safe proof recommendations should be instituted.  MEDICATION SUPERVISION: Inability to self-administer medication needs to be  constantly addressed. Implement a mechanism to ensure safe administration of the medications.  RECOMMENDATIONS FOR ALL PATIENTS WITH MEMORY PROBLEMS: 1. Continue to exercise (Recommend 30 minutes of walking everyday, or 3 hours every week) 2. Increase social interactions - continue going to [place] and enjoy social gatherings with friends and family 3. Eat healthy, avoid fried foods and eat more fruits and vegetables 4. Maintain adequate blood pressure, blood sugar, and blood cholesterol level. Reducing the risk of stroke and cardiovascular disease also helps promoting better memory. 5. Avoid stressful situations. Live a simple life and avoid aggravations. Organize your time and prepare for the next day in anticipation. 6. Sleep well, avoid any interruptions of sleep and avoid any distractions in the bedroom that may interfere with adequate sleep quality 7. Avoid sugar, avoid sweets as there is a strong link between excessive sugar intake, diabetes, and cognitive impairment We discussed the Mediterranean diet, which has been shown to help patients reduce the risk of progressive memory disorders and reduces cardiovascular risk. This includes eating fish, eat fruits and green leafy vegetables, nuts like almonds and hazelnuts, walnuts, and also use olive oil. Avoid fast foods and fried foods as much as possible. Avoid sweets and sugar as sugar use has been linked to worsening of memory function.        Mediterranean Diet  Why follow it? Research shows. Those who follow the Mediterranean diet have a reduced risk of heart disease  The diet is associated with a reduced incidence of Parkinson's and Alzheimer's diseases People following the diet may have longer life expectancies and lower rates of chronic diseases  The Dietary Guidelines for Americans recommends the Mediterranean diet as  an eating plan to promote health and prevent disease  What Is the Mediterranean Diet?  Healthy eating plan based on  typical foods and recipes of Mediterranean-style cooking The diet is primarily a plant based diet; these foods should make up a majority of meals   Starches - Plant based foods should make up a majority of meals - They are an important sources of vitamins, minerals, energy, antioxidants, and fiber - Choose whole grains, foods high in fiber and minimally processed items  - Typical grain sources include wheat, oats, barley, corn, brown rice, bulgar, farro, millet, polenta, couscous  - Various types of beans include chickpeas, lentils, fava beans, black beans, white beans   Fruits  Veggies - Large quantities of antioxidant rich fruits & veggies; 6 or more servings  - Vegetables can be eaten raw or lightly drizzled with oil and cooked  - Vegetables common to the traditional Mediterranean Diet include: artichokes, arugula, beets, broccoli, brussel sprouts, cabbage, carrots, celery, collard greens, cucumbers, eggplant, kale, leeks, lemons, lettuce, mushrooms, okra, onions, peas, peppers, potatoes, pumpkin, radishes, rutabaga, shallots, spinach, sweet potatoes, turnips, zucchini - Fruits common to the Mediterranean Diet include: apples, apricots, avocados, cherries, clementines, dates, figs, grapefruits, grapes, melons, nectarines, oranges, peaches, pears, pomegranates, strawberries, tangerines  Fats - Replace butter and margarine with healthy oils, such as olive oil, canola oil, and tahini  - Limit nuts to no more than a handful a day  - Nuts include walnuts, almonds, pecans, pistachios, pine nuts  - Limit or avoid candied, honey roasted or heavily salted nuts - Olives are central to the [employer] - can be eaten whole or used in a variety of dishes   Meats Protein - Limiting red meat: no more than a few times a month - When eating red meat: choose lean cuts and keep the portion to the size of deck of cards - Eggs: approx. 0 to 4 times a week  - Fish and lean poultry: at least 2 a week  -  Healthy protein sources include, chicken, [place], lean beef, lamb - Increase intake of seafood such as tuna, salmon, trout, mackerel, shrimp, scallops - Avoid or limit high fat processed meats such as sausage and bacon  Dairy - Include moderate amounts of low fat dairy products  - Focus on healthy dairy such as fat free yogurt, skim milk, low or reduced fat cheese - Limit dairy products higher in fat such as whole or 2% milk, cheese, ice cream  Alcohol - Moderate amounts of red wine is ok  - No more than 5 oz daily for women (all ages) and men older than age 43  - No more than 10 oz of wine daily for men younger than 66  Other - Limit sweets and other desserts  - Use herbs and spices instead of salt to flavor foods  - Herbs and spices common to the traditional Mediterranean Diet include: basil, bay leaves, chives, cloves, cumin, fennel, garlic, lavender, marjoram, mint, oregano, parsley, pepper, rosemary, sage, savory, sumac, tarragon, thyme   It's not just a diet, it's a lifestyle:  The Mediterranean diet includes lifestyle factors typical of those in the region  Foods, drinks and meals are best eaten with others and savored Daily physical activity is important for overall good health This could be strenuous exercise like running and aerobics This could also be more leisurely activities such as walking, housework, yard-work, or taking the stairs Moderation is the key; a balanced and healthy diet accommodates  most foods and drinks Consider portion sizes and frequency of consumption of certain foods   Meal Ideas & Options:  Breakfast:  Whole wheat toast or whole wheat English muffins with peanut butter & hard boiled egg Steel cut oats topped with apples & cinnamon and skim milk  Fresh fruit: banana, strawberries, melon, berries, peaches  Smoothies: strawberries, bananas, greek yogurt, peanut butter Low fat greek yogurt with blueberries and granola  Egg white omelet with spinach and  mushrooms Breakfast couscous: whole wheat couscous, apricots, skim milk, cranberries  Sandwiches:  Hummus and grilled vegetables (peppers, zucchini, squash) on whole wheat bread   Grilled chicken on whole wheat pita with lettuce, tomatoes, cucumbers or tzatziki  [place] salad on whole wheat bread: tuna salad made with greek yogurt, olives, red peppers, capers, green onions Garlic rosemary lamb pita: lamb sauted with garlic, rosemary, salt & pepper; add lettuce, cucumber, greek yogurt to pita - flavor with lemon juice and black pepper  Seafood:  Mediterranean grilled salmon, seasoned with garlic, basil, parsley, lemon juice and black pepper Shrimp, lemon, and spinach whole-grain pasta salad made with low fat greek yogurt  Seared scallops with lemon orzo  Seared tuna steaks seasoned salt, pepper, coriander topped with tomato mixture of olives, tomatoes, olive oil, minced garlic, parsley, green onions and cappers  Meats:  Herbed greek chicken salad with kalamata olives, cucumber, feta  Red bell peppers stuffed with spinach, bulgur, lean ground beef (or lentils) & topped with feta   Kebabs: skewers of chicken, tomatoes, onions, zucchini, squash  [place] burgers: made with red onions, mint, dill, lemon juice, feta cheese topped with roasted red peppers Vegetarian Cucumber salad: cucumbers, artichoke hearts, celery, red onion, feta cheese, tossed in olive oil & lemon juice  Hummus and whole grain pita points with a greek salad (lettuce, tomato, feta, olives, cucumbers, red onion) Lentil soup with celery, carrots made with vegetable broth, garlic, salt and pepper  Tabouli salad: parsley, bulgur, mint, scallions, cucumbers, tomato, radishes, lemon juice, olive oil, salt and pepper.

## 2021-07-13 ENCOUNTER — Other Ambulatory Visit (HOSPITAL_COMMUNITY): Payer: Self-pay | Admitting: Family Medicine

## 2021-07-13 DIAGNOSIS — R29898 Other symptoms and signs involving the musculoskeletal system: Secondary | ICD-10-CM

## 2021-07-13 DIAGNOSIS — R634 Abnormal weight loss: Secondary | ICD-10-CM

## 2021-07-17 ENCOUNTER — Ambulatory Visit (HOSPITAL_COMMUNITY)
Admission: RE | Admit: 2021-07-17 | Discharge: 2021-07-17 | Disposition: A | Discharge status: Home / Self Care | Payer: Medicare HMO | Source: Ambulatory Visit | Attending: Family Medicine | Admitting: Family Medicine

## 2021-07-17 ENCOUNTER — Other Ambulatory Visit: Payer: Self-pay

## 2021-07-17 DIAGNOSIS — R29898 Other symptoms and signs involving the musculoskeletal system: Secondary | ICD-10-CM | POA: Diagnosis not present

## 2021-07-17 DIAGNOSIS — R634 Abnormal weight loss: Secondary | ICD-10-CM | POA: Diagnosis present

## 2021-07-17 DIAGNOSIS — I700 Atherosclerosis of aorta: Secondary | ICD-10-CM | POA: Diagnosis not present

## 2021-07-25 ENCOUNTER — Telehealth (INDEPENDENT_AMBULATORY_CARE_PROVIDER_SITE_OTHER): Payer: Medicare HMO | Admitting: Psychiatry

## 2021-07-25 ENCOUNTER — Encounter (HOSPITAL_COMMUNITY): Payer: Self-pay | Admitting: Psychiatry

## 2021-07-25 ENCOUNTER — Other Ambulatory Visit: Payer: Self-pay | Admitting: Gastroenterology

## 2021-07-25 ENCOUNTER — Other Ambulatory Visit: Payer: Self-pay

## 2021-07-25 ENCOUNTER — Other Ambulatory Visit (HOSPITAL_COMMUNITY): Payer: Self-pay | Admitting: Gastroenterology

## 2021-07-25 DIAGNOSIS — F319 Bipolar disorder, unspecified: Secondary | ICD-10-CM

## 2021-07-25 DIAGNOSIS — R160 Hepatomegaly, not elsewhere classified: Secondary | ICD-10-CM

## 2021-07-25 DIAGNOSIS — R932 Abnormal findings on diagnostic imaging of liver and biliary tract: Secondary | ICD-10-CM

## 2021-07-25 MED ORDER — PAROXETINE HCL 40 MG PO TABS: 40.0000 mg | ORAL_TABLET | ORAL | 2 refills | Status: DC

## 2021-07-25 MED ORDER — TRAZODONE HCL 150 MG PO TABS: 150.0000 mg | ORAL_TABLET | Freq: Every day | ORAL | 2 refills | Status: DC

## 2021-07-25 MED ORDER — GABAPENTIN 300 MG PO CAPS: 600.0000 mg | ORAL_CAPSULE | Freq: Two times a day (BID) | ORAL | 2 refills | Status: DC

## 2021-07-25 MED ORDER — ALPRAZOLAM 0.5 MG PO TABS: 0.5000 mg | ORAL_TABLET | Freq: Four times a day (QID) | ORAL | 1 refills | Status: DC | PRN

## 2021-07-25 NOTE — Progress Notes (Signed)
Virtual Visit via Telephone Note  I connected with Katherine Middleton on 07/25/21 at  1:40 PM EDT by telephone and verified that I am speaking with the correct person using two identifiers.  Location: Patient: home Provider: home office   I discussed the limitations, risks, security and privacy concerns of performing an evaluation and management service by telephone and the availability of in person appointments. I also discussed with the patient that there may be a patient responsible charge related to this service. The patient expressed understanding and agreed to proceed.       I discussed the assessment and treatment plan with the patient. The patient was provided an opportunity to ask questions and all were answered. The patient agreed with the plan and demonstrated an understanding of the instructions.   The patient was advised to call back or seek an in-person evaluation if the symptoms worsen or if the condition fails to improve as anticipated.  I provided 15 minutes of non-face-to-face time during this encounter.   Levonne Spiller, MD  [facility] MD/PA/NP OP Progress Note  07/25/2021 1:47 PM Katherine Middleton  MRN:  [id number]  Chief Complaint:  Chief Complaint   Anxiety; Depression; Follow-up    HPI: Patient is 70 year-old widowed Caucasian female who lives alone in [place]. . She is on disability for fibromyalgia arthritis and bipolar disorder.   The patient has been seeing a psychiatrist for years. Initially she was diagnosed with depression but eventually she developed some manic symptoms and now is designated as having bipolar disorder. She's often very tired due to her fibromyalgia and has chronic muscle aches and pains. She feels that the medicines she is on now been very helpful. She sleeping well and her mood is stable. She's not using drugs or alcohol denies suicidal ideation. She's never been psychotic but mentions having  "breakdown" about 10 years ago. She's never been  hospitalized.  The patient returns for follow-up after 3 months.  She is continuing to have sustained weight loss.  She is now down to 97 pounds.  She finally did the abdominal CT which revealed a small lesion in her liver.  She is being worked up by GI.  She she claims that she eats 3 meals a day and is not drinking alcohol.  She states prior to a few weeks ago she was drinking 1 or 2 beers a day.  She states that her spirits are good and she denies significant depression anxiety or difficulty sleeping.  Her mood has been stable.  She denies any manic symptoms. Visit Diagnosis:    ICD-10-CM   1. Bipolar 1 disorder (HCC)  F31.9 PARoxetine (PAXIL) 40 MG tablet    traZODone (DESYREL) 150 MG tablet      Past Psychiatric History long-term outpatient treatment  Past Medical History:  Past Medical History:  Diagnosis Date   Arthritis    Bipolar disorder ([place])    Fibromyalgia    GERD (gastroesophageal reflux disease) 12/31/2002   Osteoarthritis    Sinusitis 12/31/2012    Past Surgical History:  Procedure Laterality Date   TONSILLECTOMY  04/08/57   some date around then   TUBAL LIGATION  10/21/1984    Family Psychiatric History see below  Family History:  Family History  Problem Relation Age of Onset   Bipolar disorder Sister    Anxiety disorder Sister    Dementia Sister 6   Paranoid behavior Sister    Bipolar disorder Father    Schizophrenia Father  Alcohol abuse Maternal Uncle    Alcohol abuse Paternal Uncle    Anxiety disorder Mother    ADD / ADHD Other    ADD / ADHD Other    Healthy Other    OCD Neg Hx    Seizures Neg Hx    Sexual abuse Neg Hx    Physical abuse Neg Hx     Social History:  Social History   Socioeconomic History   Marital status: Married    Spouse name: Not on file   Number of children: Not on file   Years of education: Not on file   Highest education level: Not on file  Occupational History   Not on file  Tobacco Use   Smoking status: Every Day     Packs/day: 0.50    Years: 30.00    Pack years: 15.00    Types: Cigarettes   Smokeless tobacco: Never   Tobacco comments:    11-15 cigs a day as of 05/01/2013  Vaping Use   Vaping Use: Never used  Substance and Sexual Activity   Alcohol use: No   Drug use: No   Sexual activity: Yes    Partners: Male    Birth control/protection: Post-menopausal  Other Topics Concern   Not on file  Social History Narrative   Left handed    Lives alone    Social Determinants of Health   Financial Resource Strain: Not on file  Food Insecurity: Not on file  Transportation Needs: Not on file  Physical Activity: Not on file  Stress: Not on file  Social Connections: Not on file    Allergies:  Allergies  Allergen Reactions   Codeine Itching   Cymbalta [Duloxetine Hcl] Other (See Comments)    GERD   Duloxetine     Other reaction(s): Other (See Comments), Unknown GERD    Oxycodone Itching and Other (See Comments)    "OUT THERE", climbing the walls   Baclofen Other (See Comments)    Anxiety got much worse and possibly ppted manic episode Other reaction(s): Unknown   Povidone Iodine Itching and Other (See Comments)    Particularly if in a douche  Other reaction(s): Unknown   Amoxicillin-Pot Clavulanate     Other reaction(s): Other (See Comments), Unknown other    Cephalosporins     Other reaction(s): Unknown   Flonase [Fluticasone]     Makes eye pressure to increase   Penicillins Swelling   Propoxyphene     Other reaction(s): Other (See Comments), Unknown other    Sulfa Antibiotics Swelling   Sulfasalazine Swelling    Other reaction(s): Unknown   Tramadol     Other reaction(s): Other (See Comments), Unknown Patient states that this makes her nervous    Amitriptyline Other (See Comments)    Caused bowels to empty quickly and couldn't sleep on it. Other reaction(s): Other (See Comments), Unknown Caused bowels to empty quickly and couldn't sleep on it. Caused bowels to empty  quickly and couldn't sleep on it. Caused bowels to empty quickly and couldn't sleep on it.    Aspirin Hives and Rash   Latex Swelling and Other (See Comments)    If in the mouth, it swells   Nsaids Nausea Only and Other (See Comments)    Almost passing out, bad acid reflux Other reaction(s): Other (See Comments) other   Tolmetin Nausea Only    Other reaction(s): Other (See Comments), Unknown Almost passing out, bad acid reflux Almost passing out, bad acid reflux  Metabolic Disorder Labs: No results found for: HGBA1C, MPG No results found for: PROLACTIN No results found for: CHOL, TRIG, HDL, CHOLHDL, VLDL, LDLCALC No results found for: TSH  Therapeutic Level Labs: No results found for: LITHIUM No results found for: VALPROATE No components found for:  CBMZ  Current Medications: Current Outpatient Medications  Medication Sig Dispense Refill   acetaminophen (TYLENOL) 650 MG CR tablet Take 650 mg by mouth at bedtime.     ALPRAZolam (XANAX) 0.5 MG tablet Take 1 tablet (0.5 mg total) by mouth 4 (four) times daily as needed. 360 tablet 1   Calcium Citrate-Vitamin D (CALCIUM + D PO) Take by mouth daily.     fexofenadine (ALLEGRA) 30 MG/5ML suspension Take 30 mg by mouth daily.     gabapentin (NEURONTIN) 300 MG capsule Take 2 capsules (600 mg total) by mouth 2 (two) times daily. 480 capsule 2   omeprazole (PRILOSEC) 20 MG capsule Take 20 mg by mouth daily.      PARoxetine (PAXIL) 40 MG tablet Take 1 tablet (40 mg total) by mouth every morning. 90 tablet 2   simvastatin (ZOCOR) 20 MG tablet Take 20 mg by mouth.     sodium chloride (OCEAN) 0.65 % SOLN nasal spray Place 1 spray into both nostrils as needed for congestion.     TiZANidine HCl (ZANAFLEX PO) Take 4 mg by mouth 2 (two) times daily.     traZODone (DESYREL) 150 MG tablet Take 1 tablet (150 mg total) by mouth at bedtime. 90 tablet 2   No current facility-administered medications for this visit.      Musculoskeletal: Strength & Muscle Tone: decreased Gait & Station: unsteady Patient leans: N/A  Psychiatric Specialty Exam: Review of Systems  There were no vitals taken for this visit.There is no height or weight on file to calculate BMI.  General Appearance: NA  Eye Contact:  NA  Speech:  Clear and Coherent  Volume:  Normal  Mood:  Euthymic  Affect:  NA  Thought Process:  Goal Directed  Orientation:  Full (Time, Place, and Person)  Thought Content: WDL   Suicidal Thoughts:  No  Homicidal Thoughts:  No  Memory:  Immediate;   Good Recent;   Good Remote;   Good  Judgement:  Good  Insight:  Good  Psychomotor Activity:  Decreased  Concentration:  Concentration: Good and Attention Span: Good  Recall:  Good  Fund of Knowledge: Good  Language: Good  Akathisia:  No  Handed:  Right  AIMS (if indicated): not done  Assets:  Communication Skills Desire for Improvement Resilience Social Support  ADL's:  Intact  Cognition: WNL  Sleep:  Good   Screenings: PHQ2-9    Flowsheet Row Video Visit from 07/25/2021 in [facility] Video Visit from 05/02/2021 in [facility] Video Visit from 03/01/2021 in [facility]  PHQ-2 Total Score 0 0 0      Flowsheet Row Video Visit from 07/25/2021 in [facility] Video Visit from 05/02/2021 in [facility] from 03/22/2021 in [facility] No Risk No Risk No Risk        Assessment and Plan: This patient is a 70 year old female with a history of bipolar disorder.  She is still struggling with weight loss and the recent lesion found in her liver is concerning.  This is being worked up.  In terms of her  psychiatric symptoms she has been stable.  She will continue trazodone 150 mg at  bedtime for sleep, Xanax 0.5 mg 4 times daily as needed for anxiety, gabapentin 600 mg twice daily for anxiety and fibromyalgia and Paxil 40 mg daily for depression.  She will return to see me in 3 months   Levonne Spiller, MD 07/25/2021, 1:47 PM

## 2021-07-31 DIAGNOSIS — R160 Hepatomegaly, not elsewhere classified: Secondary | ICD-10-CM

## 2021-07-31 HISTORY — DX: Hepatomegaly, not elsewhere classified: R16.0

## 2021-08-04 ENCOUNTER — Other Ambulatory Visit: Payer: Self-pay

## 2021-08-04 ENCOUNTER — Ambulatory Visit (HOSPITAL_COMMUNITY)
Admission: RE | Admit: 2021-08-04 | Discharge: 2021-08-04 | Disposition: A | Discharge status: Home / Self Care | Payer: Medicare HMO | Source: Ambulatory Visit | Attending: Gastroenterology | Admitting: Gastroenterology

## 2021-08-04 DIAGNOSIS — R160 Hepatomegaly, not elsewhere classified: Secondary | ICD-10-CM | POA: Diagnosis present

## 2021-08-04 DIAGNOSIS — R932 Abnormal findings on diagnostic imaging of liver and biliary tract: Secondary | ICD-10-CM

## 2021-08-04 MED ORDER — GADOBUTROL 1 MMOL/ML IV SOLN
5.0000 mL | Freq: Once | INTRAVENOUS | Status: AC | PRN
  Administered 2021-08-04: 5 mL via INTRAVENOUS

## 2021-08-09 ENCOUNTER — Other Ambulatory Visit: Payer: Self-pay | Admitting: Gastroenterology

## 2021-08-09 ENCOUNTER — Encounter (HOSPITAL_COMMUNITY): Payer: Self-pay | Admitting: Gastroenterology

## 2021-08-09 ENCOUNTER — Other Ambulatory Visit: Payer: Self-pay

## 2021-08-10 NOTE — Anesthesia Preprocedure Evaluation (Addendum)
Anesthesia Evaluation  Patient identified by MRN, date of birth, ID band Patient awake    Reviewed: Allergy & Precautions, H&P , NPO status , Patient's Chart, lab work & pertinent test results  Airway Mallampati: II  TM Distance: >3 FB Neck ROM: Full    Dental no notable dental hx.    Pulmonary neg pulmonary ROS, Current Smoker,    Pulmonary exam normal breath sounds clear to auscultation       Cardiovascular Exercise Tolerance: Good negative cardio ROS Normal cardiovascular exam Rhythm:Regular Rate:Normal     Neuro/Psych PSYCHIATRIC DISORDERS Anxiety Bipolar Disorder  Neuromuscular disease negative neurological ROS  negative psych ROS   GI/Hepatic negative GI ROS, Neg liver ROS, GERD  ,  Endo/Other  negative endocrine ROS  Renal/GU negative Renal ROS  negative genitourinary   Musculoskeletal negative musculoskeletal ROS (+) Arthritis , Osteoarthritis,  Fibromyalgia -  Abdominal   Peds negative pediatric ROS (+)  Hematology negative hematology ROS (+)   Anesthesia Other Findings   Reproductive/Obstetrics negative OB ROS                            Anesthesia Physical Anesthesia Plan  ASA: 2  Anesthesia Plan: MAC   Post-op Pain Management:    Induction: Intravenous  PONV Risk Score and Plan: 2 and Treatment may vary due to age or medical condition  Airway Management Planned: Mask, Natural Airway, Nasal Cannula and Simple Face Mask  Additional Equipment: None  Intra-op Plan:   Post-operative Plan:   Informed Consent: I have reviewed the patients History and Physical, chart, labs and discussed the procedure including the risks, benefits and alternatives for the proposed anesthesia with the patient or authorized representative who has indicated his/her understanding and acceptance.       Plan Discussed with: CRNA and Anesthesiologist  Anesthesia Plan Comments:          Anesthesia Quick Evaluation

## 2021-08-11 ENCOUNTER — Encounter (HOSPITAL_COMMUNITY)
Admission: RE | Disposition: A | Discharge status: Home / Self Care | Payer: Self-pay | Source: Home / Self Care | Attending: Gastroenterology

## 2021-08-11 ENCOUNTER — Ambulatory Visit (HOSPITAL_COMMUNITY)
Admission: RE | Admit: 2021-08-11 | Discharge: 2021-08-11 | Disposition: A | Discharge status: Home / Self Care | Payer: Medicare HMO | Attending: Gastroenterology | Admitting: Gastroenterology

## 2021-08-11 ENCOUNTER — Ambulatory Visit (HOSPITAL_COMMUNITY): Payer: Medicare HMO | Admitting: Certified Registered Nurse Anesthetist

## 2021-08-11 ENCOUNTER — Encounter (HOSPITAL_COMMUNITY): Payer: Self-pay | Admitting: Gastroenterology

## 2021-08-11 DIAGNOSIS — I700 Atherosclerosis of aorta: Secondary | ICD-10-CM | POA: Diagnosis not present

## 2021-08-11 DIAGNOSIS — Z888 Allergy status to other drugs, medicaments and biological substances status: Secondary | ICD-10-CM | POA: Diagnosis not present

## 2021-08-11 DIAGNOSIS — Z886 Allergy status to analgesic agent status: Secondary | ICD-10-CM | POA: Diagnosis not present

## 2021-08-11 DIAGNOSIS — K8689 Other specified diseases of pancreas: Secondary | ICD-10-CM | POA: Diagnosis not present

## 2021-08-11 DIAGNOSIS — D490 Neoplasm of unspecified behavior of digestive system: Secondary | ICD-10-CM | POA: Insufficient documentation

## 2021-08-11 DIAGNOSIS — Z882 Allergy status to sulfonamides status: Secondary | ICD-10-CM | POA: Diagnosis not present

## 2021-08-11 DIAGNOSIS — Z800 Family history of malignant neoplasm of digestive organs: Secondary | ICD-10-CM | POA: Insufficient documentation

## 2021-08-11 DIAGNOSIS — Z881 Allergy status to other antibiotic agents status: Secondary | ICD-10-CM | POA: Insufficient documentation

## 2021-08-11 DIAGNOSIS — Z885 Allergy status to narcotic agent status: Secondary | ICD-10-CM | POA: Insufficient documentation

## 2021-08-11 DIAGNOSIS — Z880 Allergy status to penicillin: Secondary | ICD-10-CM | POA: Insufficient documentation

## 2021-08-11 DIAGNOSIS — F17210 Nicotine dependence, cigarettes, uncomplicated: Secondary | ICD-10-CM | POA: Insufficient documentation

## 2021-08-11 HISTORY — PX: UPPER ESOPHAGEAL ENDOSCOPIC ULTRASOUND (EUS): SHX6562

## 2021-08-11 HISTORY — DX: Presence of spectacles and contact lenses: Z97.3

## 2021-08-11 HISTORY — PX: ESOPHAGOGASTRODUODENOSCOPY (EGD) WITH PROPOFOL: SHX5813

## 2021-08-11 SURGERY — UPPER ESOPHAGEAL ENDOSCOPIC ULTRASOUND (EUS)
Anesthesia: Monitor Anesthesia Care

## 2021-08-11 MED ORDER — SODIUM CHLORIDE 0.9 % IV SOLN: INTRAVENOUS | Status: DC

## 2021-08-11 MED ORDER — PROPOFOL 500 MG/50ML IV EMUL
INTRAVENOUS | Status: AC
  Filled 2021-08-11: qty 50

## 2021-08-11 MED ORDER — GLYCOPYRROLATE 0.2 MG/ML IJ SOLN
INTRAMUSCULAR | Status: DC | PRN
  Administered 2021-08-11: .2 mg via INTRAVENOUS

## 2021-08-11 MED ORDER — PROPOFOL 500 MG/50ML IV EMUL
INTRAVENOUS | Status: DC | PRN
  Administered 2021-08-11: 100 ug/kg/min via INTRAVENOUS

## 2021-08-11 MED ORDER — LACTATED RINGERS IV SOLN: INTRAVENOUS | Status: DC

## 2021-08-11 MED ORDER — LIDOCAINE 2% (20 MG/ML) 5 ML SYRINGE
INTRAMUSCULAR | Status: DC | PRN
  Administered 2021-08-11: 40 mg via INTRAVENOUS

## 2021-08-11 MED ORDER — PROPOFOL 10 MG/ML IV BOLUS
INTRAVENOUS | Status: DC | PRN
  Administered 2021-08-11 (×2): 10 mg via INTRAVENOUS

## 2021-08-11 NOTE — Op Note (Addendum)
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 Estimated Blood Loss:     Estimated blood loss: none. Procedure:                Pre-Anesthesia Assessment:                           - Prior to the procedure, a History and Physical                            was performed, and patient medications and                            allergies were reviewed. The patient's tolerance of                            previous anesthesia was also reviewed. The risks                            and benefits of the procedure and the sedation                            options and risks were discussed with the patient.                            All questions were answered, and informed consent                            was obtained. Prior Anticoagulants: The patient has                            taken no previous anticoagulant or antiplatelet                            agents. ASA Grade Assessment: III - A patient with                            severe systemic disease. After reviewing the risks  and benefits, the patient was deemed in                            satisfactory condition to undergo the procedure.                           - Sedation was administered by an anesthesia                            professional. Deep sedation was attained.                           After obtaining informed consent, the endoscope was                            passed under direct vision.  Throughout the                            procedure, the patient's blood pressure, pulse, and                            oxygen saturations were monitored continuously. The                            [id number] Olympus linear ultrasound scope                            was introduced through the mouth, and advanced to                            the second part of duodenum. The upper EUS was                            accomplished without difficulty. The patient                            tolerated the procedure well. Scope In: Scope Out: Findings:      ENDOSONOGRAPHIC FINDING: :      A round mass was identified in the pancreatic head and uncinate process       of the pancreas. The mass was hypoechoic. The mass measured 39 mm by 21       mm in maximal cross-sectional diameter. The outer margins were       irregular. The remainder of the pancreas was examined. The       endosonographic appearance of parenchyma and the upstream pancreatic       duct indicated a maximum duct diameter of 6 mm and parenchymal atrophy.      In the head/uncinate process of the pancreas a large 39 x 21 mm       hyopoechoic irregularly bordered mass was identified. In this area there       was suggestion of some cysts versus side branch ductal dilation. FNA was       not possible as large or multiple vessels were in the path of the FNA       approach. The pancreas did  exhibits atrophy in the body and tail and the       PD in that region was dilated at 6 mm. Atherosclerosis was noted in the       abdominal aorta. The rest of the other upper GI organs appeared normal. Impression:               - A mass was identified in the pancreatic head and                            uncinate process of the pancreas.                           - No specimens collected. Moderate Sedation:      Not Applicable - Patient had care per Anesthesia. Recommendation:           - Patient has a contact number available for                             emergencies. The signs and symptoms of potential                            delayed complications were discussed with the                            patient. Return to normal activities tomorrow.                            Written discharge instructions were provided to the                            patient.                           - Resume previous diet.                           - ? repeat EUS versus IR attempt with biopsy. Procedure Code(s):        --- Professional ---                           [phone], Esophagogastroduodenoscopy, flexible,                            transoral; with endoscopic ultrasound examination                            limited to the esophagus, stomach or duodenum, and                            adjacent structures Diagnosis Code(s):        --- Professional ---                           K86.89, Other specified diseases of pancreas CPT copyright 2019 [employer]. All rights reserved. The codes documented in this report are preliminary and upon coder review may  be revised to meet current compliance requirements. Carol Ada, MD Carol Ada, MD 08/11/2021 8:34:38 AM This report has been signed electronically. Number of Addenda: 0

## 2021-08-11 NOTE — Transfer of Care (Signed)
Immediate Anesthesia Transfer of Care Note  Patient: Katherine Middleton  Procedure(s) Performed: UPPER ESOPHAGEAL ENDOSCOPIC ULTRASOUND (EUS) ESOPHAGOGASTRODUODENOSCOPY (EGD) WITH PROPOFOL  Patient Location: PACU  Anesthesia Type:MAC  Level of Consciousness: awake, drowsy and patient cooperative  Airway & Oxygen Therapy: Patient Spontanous Breathing and Patient connected to face mask oxygen  Post-op Assessment: Report given to RN and Post -op Vital signs reviewed and stable  Post vital signs: Reviewed and stable  Last Vitals:  Vitals Value Taken Time  BP    Temp    Pulse    Resp    SpO2      Last Pain:  Vitals:   08/11/21 0720  TempSrc: Temporal  PainSc: 0-No pain         Complications: No notable events documented.

## 2021-08-11 NOTE — Discharge Instructions (Signed)

## 2021-08-11 NOTE — Anesthesia Procedure Notes (Addendum)
Procedure Name: MAC Date/Time: 08/11/2021 7:36 AM Performed by: West Pugh, CRNA Pre-anesthesia Checklist: Patient identified, Emergency Drugs available, Suction available, Patient being monitored and Timeout performed Patient Re-evaluated:Patient Re-evaluated prior to induction Oxygen Delivery Method: Simple face mask Preoxygenation: Pre-oxygenation with 100% oxygen Placement Confirmation: positive ETCO2 Dental Injury: Teeth and Oropharynx as per pre-operative assessment  Comments: Biteblock gently placed by RN.

## 2021-08-11 NOTE — H&P (Signed)
Katherine Middleton HPI: During a work up for a liver lesion and weight loss a pancreatic head mass was identified.  The pancreatic head mass measured 3.1 x 2.7 cm.  The liver lesion was simply a minimally complex cyst on the MRI.  Past Medical History:  Diagnosis Date   Arthritis    Bipolar disorder ([place])    Fibromyalgia    GERD (gastroesophageal reflux disease) 12/31/2002   Osteoarthritis    Prolonged Q-T interval on ECG 02/2021   Sinusitis 12/31/2012   Wears glasses     Past Surgical History:  Procedure Laterality Date   TONSILLECTOMY  04/08/57   some date around then   TUBAL LIGATION  10/21/1984    Family History  Problem Relation Age of Onset   Bipolar disorder Sister    Anxiety disorder Sister    Dementia Sister 63   Paranoid behavior Sister    Bipolar disorder Father    Schizophrenia Father    Alcohol abuse Maternal Uncle    Alcohol abuse Paternal Uncle    Anxiety disorder Mother    ADD / ADHD Other    ADD / ADHD Other    Healthy Other    OCD Neg Hx    Seizures Neg Hx    Sexual abuse Neg Hx    Physical abuse Neg Hx     Social History:  reports that she has been smoking cigarettes. She has a 15.00 pack-year smoking history. She has never used smokeless tobacco. She reports that she does not drink alcohol and does not use drugs.  Allergies:  Allergies  Allergen Reactions   Codeine Itching   Cymbalta [Duloxetine Hcl] Other (See Comments)    GERD   Duloxetine     Other reaction(s): Other (See Comments), Unknown GERD    Oxycodone Itching and Other (See Comments)    "OUT THERE", climbing the walls   Baclofen Other (See Comments)    Anxiety got much worse and possibly ppted manic episode Other reaction(s): Unknown   Povidone Iodine Itching and Other (See Comments)    Particularly if in a douche  Other reaction(s): Unknown   Amoxicillin-Pot Clavulanate Other (See Comments)     Unknown     Cephalosporins     Other reaction(s): Unknown   Flonase  [Fluticasone]     Makes eye pressure to increase   Penicillins Swelling    Throat swelling Reaction: 8-10 years ago   Propoxyphene     Other reaction(s): Other (See Comments), Unknown other    Sulfa Antibiotics Swelling   Sulfasalazine Swelling    Other reaction(s): Unknown   Tramadol     Other reaction(s): Other (See Comments), Unknown Patient states that this makes her nervous    Amitriptyline Other (See Comments)    Caused bowels to empty quickly and couldn't sleep on it. Other reaction(s): Other (See Comments), Unknown Caused bowels to empty quickly and couldn't sleep on it. Caused bowels to empty quickly and couldn't sleep on it. Caused bowels to empty quickly and couldn't sleep on it.    Aspirin Hives and Rash   Latex Swelling and Other (See Comments)    If in the mouth, it swells   Nsaids Nausea Only and Other (See Comments)    Almost passing out, bad acid reflux Other reaction(s): Other (See Comments) other   Tolmetin Nausea Only    Other reaction(s): Other (See Comments), Unknown Almost passing out, bad acid reflux Almost passing out, bad acid reflux  Medications: Scheduled: Continuous:  sodium chloride      No results found for this or any previous visit (from the past 24 hour(s)).   No results found.  ROS:  As stated above in the HPI otherwise negative.  Height '5\' 4"'$  (1.626 m), weight 44 kg.    PE: Gen: NAD, Alert and Oriented HEENT:  [place]/AT, EOMI Neck: Supple, no LAD Lungs: CTA Bilaterally CV: RRR without M/G/R ABD: Soft, NTND, +BS Ext: No C/C/E  Assessment/Plan: 1) Pancreatic head mass - She is here for an EUS with FNA.  The patient's mother died from pancreatic cancer and she is a smoker.  Katherine Middleton D 08/11/2021, 7:10 AM

## 2021-08-13 NOTE — Anesthesia Postprocedure Evaluation (Signed)
Anesthesia Post Note  Patient: Laia Wiley  Procedure(s) Performed: UPPER ESOPHAGEAL ENDOSCOPIC ULTRASOUND (EUS) ESOPHAGOGASTRODUODENOSCOPY (EGD) WITH PROPOFOL     Patient location during evaluation: PACU Anesthesia Type: MAC Level of consciousness: awake and alert Pain management: pain level controlled Vital Signs Assessment: post-procedure vital signs reviewed and stable Respiratory status: spontaneous breathing, nonlabored ventilation, respiratory function stable and patient connected to nasal cannula oxygen Cardiovascular status: stable and blood pressure returned to baseline Postop Assessment: no apparent nausea or vomiting Anesthetic complications: no   No notable events documented.  Last Vitals:  Vitals:   08/11/21 0820 08/11/21 0830  BP: (!) 132/45 (!) 129/48  Pulse: (!) 50 (!) 46  Resp: 16 18  Temp:    SpO2: 100% 98%    Last Pain:  Vitals:   08/11/21 0830  TempSrc:   PainSc: 0-No pain   Pain Goal:                   Hosteen Kienast

## 2021-08-14 ENCOUNTER — Telehealth: Payer: Medicare HMO

## 2021-08-14 NOTE — Telephone Encounter (Signed)
Dr Rush Landmark I want to make sure we are on the same page.  Do you want this pt scheduled the week of 8/22 or your hospital week in October?

## 2021-08-14 NOTE — Telephone Encounter (Signed)
Week of 8/22.  Thanks. GM

## 2021-08-14 NOTE — Telephone Encounter (Signed)
-----   Message from Irving Copas., MD sent at 08/13/2021  8:54 PM EDT ----- PH, Thanks for sending me this. Katherine Middleton, this is a patient of Dr. Ulyses Amor who needs a repeat EUS attempt for pancreatic lesion and potential fine-needle biopsy. Please move forward with scheduling this patient on my hospital week for a 60-minute EUS linear at attempt of fine-needle biopsy. Also, please make sure that we except her insurance such that we can do this procedure and it is not out of network. Please let Dr. Benson Norway and I know once she is scheduled (try to do on Tuesday/Wednesday/Thursday). Thanks. GM ----- Message ----- From: Carol Ada, MD Sent: 08/11/2021   2:16 PM EDT To: Irving Copas., MD  Gabe,   This is the EUS.  I spoke with the patient's son and his number is listed in [place].  He is the contact person.  They are agreeable to a repeat EUS.  Thanks!  [name]

## 2021-08-15 ENCOUNTER — Encounter (HOSPITAL_COMMUNITY): Payer: Self-pay | Admitting: Gastroenterology

## 2021-08-15 ENCOUNTER — Other Ambulatory Visit: Payer: Self-pay

## 2021-08-15 DIAGNOSIS — K869 Disease of pancreas, unspecified: Secondary | ICD-10-CM

## 2021-08-15 NOTE — Telephone Encounter (Signed)
Thanks for update Patty. FYI PH  GM

## 2021-08-15 NOTE — Telephone Encounter (Signed)
Patient son Katherine Middleton called in to reschedule appt on 8/25 due to him being out of town. Best contact number [phone]

## 2021-08-15 NOTE — Telephone Encounter (Signed)
I spoke with the pt son and advised that dates for the procedure are limited.  I also advised that Dr Rush Landmark had asked for the case to be scheduled during the week she is scheduled.  He tells me to leave the case as scheduled and he will try and make arrangements for someone to be with the pt. He will call back if he can not get someone to be with the pt and reschedule.

## 2021-08-15 NOTE — Telephone Encounter (Signed)
The pt has been scheduled for 08/24/21 at 1130 am at [facility] with GM.    EUS scheduled, pt instructed and medications reviewed.  Patient instructions mailed to home and sent to My Chart.  Patient to call with any questions or concerns.

## 2021-08-23 ENCOUNTER — Encounter (HOSPITAL_COMMUNITY): Payer: Self-pay | Admitting: Gastroenterology

## 2021-08-23 NOTE — Progress Notes (Signed)
DUE TO COVID-19 ONLY ONE VISITOR IS ALLOWED TO COME WITH YOU AND STAY IN THE WAITING ROOM ONLY DURING PRE OP AND PROCEDURE DAY OF SURGERY.   PCP - Bing Matter, PA-C Cardiologist - n/a [facility] - Dr Levonne Spiller  CT Chest x-ray - 07/17/21 EKG - 03/03/21 Stress Test - n/a ECHO - n/a Cardiac Cath - n/a  Sleep Study -  n/a CPAP - none  Anesthesia review: Yes  STOP now taking any Aspirin (unless otherwise instructed by your surgeon), Aleve, Naproxen, Ibuprofen, Motrin, Advil, Goody's, BC's, all herbal medications, fish oil, and all vitamins.   Coronavirus Screening Covid test n/a Ambulatory Surgery   Do you have any of the following symptoms:  Cough yes/no: No Fever (>100.66F)  yes/no: No Runny nose yes/no: No Sore throat yes/no: No Difficulty breathing/shortness of breath  yes/no: No  Have you traveled in the last 14 days and where? yes/no: No  Patient verbalized understanding of instructions that were given via phone.

## 2021-08-24 ENCOUNTER — Ambulatory Visit (HOSPITAL_COMMUNITY)
Admission: RE | Admit: 2021-08-24 | Discharge: 2021-08-24 | Disposition: A | Discharge status: Home / Self Care | Payer: Medicare HMO | Attending: Gastroenterology | Admitting: Gastroenterology

## 2021-08-24 ENCOUNTER — Encounter (HOSPITAL_COMMUNITY): Payer: Self-pay | Admitting: Gastroenterology

## 2021-08-24 ENCOUNTER — Other Ambulatory Visit: Payer: Self-pay

## 2021-08-24 ENCOUNTER — Encounter (HOSPITAL_COMMUNITY)
Admission: RE | Disposition: A | Discharge status: Home / Self Care | Payer: Self-pay | Source: Home / Self Care | Attending: Gastroenterology

## 2021-08-24 ENCOUNTER — Ambulatory Visit (HOSPITAL_COMMUNITY): Payer: Medicare HMO | Admitting: Physician Assistant

## 2021-08-24 DIAGNOSIS — Z886 Allergy status to analgesic agent status: Secondary | ICD-10-CM | POA: Diagnosis not present

## 2021-08-24 DIAGNOSIS — Z880 Allergy status to penicillin: Secondary | ICD-10-CM | POA: Insufficient documentation

## 2021-08-24 DIAGNOSIS — K449 Diaphragmatic hernia without obstruction or gangrene: Secondary | ICD-10-CM | POA: Insufficient documentation

## 2021-08-24 DIAGNOSIS — K8689 Other specified diseases of pancreas: Secondary | ICD-10-CM

## 2021-08-24 DIAGNOSIS — Z885 Allergy status to narcotic agent status: Secondary | ICD-10-CM | POA: Diagnosis not present

## 2021-08-24 DIAGNOSIS — Z882 Allergy status to sulfonamides status: Secondary | ICD-10-CM | POA: Diagnosis not present

## 2021-08-24 DIAGNOSIS — R933 Abnormal findings on diagnostic imaging of other parts of digestive tract: Secondary | ICD-10-CM | POA: Diagnosis present

## 2021-08-24 DIAGNOSIS — K869 Disease of pancreas, unspecified: Secondary | ICD-10-CM

## 2021-08-24 DIAGNOSIS — K7689 Other specified diseases of liver: Secondary | ICD-10-CM

## 2021-08-24 DIAGNOSIS — K2970 Gastritis, unspecified, without bleeding: Secondary | ICD-10-CM | POA: Diagnosis not present

## 2021-08-24 DIAGNOSIS — Z888 Allergy status to other drugs, medicaments and biological substances status: Secondary | ICD-10-CM | POA: Diagnosis not present

## 2021-08-24 DIAGNOSIS — Z881 Allergy status to other antibiotic agents status: Secondary | ICD-10-CM | POA: Diagnosis not present

## 2021-08-24 DIAGNOSIS — K2289 Other specified disease of esophagus: Secondary | ICD-10-CM | POA: Insufficient documentation

## 2021-08-24 DIAGNOSIS — I899 Noninfective disorder of lymphatic vessels and lymph nodes, unspecified: Secondary | ICD-10-CM | POA: Insufficient documentation

## 2021-08-24 HISTORY — PX: EUS: SHX5427

## 2021-08-24 HISTORY — DX: Anxiety disorder, unspecified: F41.9

## 2021-08-24 HISTORY — PX: ESOPHAGOGASTRODUODENOSCOPY (EGD) WITH PROPOFOL: SHX5813

## 2021-08-24 HISTORY — DX: Depression, unspecified: F32.A

## 2021-08-24 HISTORY — DX: Hyperlipidemia, unspecified: E78.5

## 2021-08-24 HISTORY — PX: FINE NEEDLE ASPIRATION: SHX6590

## 2021-08-24 HISTORY — PX: BIOPSY: SHX5522

## 2021-08-24 HISTORY — DX: Herpesviral infection, unspecified: B00.9

## 2021-08-24 SURGERY — ESOPHAGOGASTRODUODENOSCOPY (EGD) WITH PROPOFOL
Anesthesia: Monitor Anesthesia Care

## 2021-08-24 MED ORDER — CIPROFLOXACIN HCL 500 MG PO TABS: 500.0000 mg | ORAL_TABLET | Freq: Two times a day (BID) | ORAL | 0 refills | Status: AC

## 2021-08-24 MED ORDER — CIPROFLOXACIN IN D5W 400 MG/200ML IV SOLN
INTRAVENOUS | Status: AC
  Filled 2021-08-24: qty 200

## 2021-08-24 MED ORDER — CIPROFLOXACIN IN D5W 400 MG/200ML IV SOLN
INTRAVENOUS | Status: DC | PRN
  Administered 2021-08-24: 400 mg via INTRAVENOUS

## 2021-08-24 MED ORDER — PROPOFOL 500 MG/50ML IV EMUL
INTRAVENOUS | Status: DC | PRN
  Administered 2021-08-24: 125 ug/kg/min via INTRAVENOUS

## 2021-08-24 MED ORDER — SODIUM CHLORIDE 0.9 % IV SOLN: INTRAVENOUS | Status: DC

## 2021-08-24 MED ORDER — LACTATED RINGERS IV SOLN
INTRAVENOUS | Status: AC | PRN
  Administered 2021-08-24: 1000 mL via INTRAVENOUS

## 2021-08-24 MED ORDER — PROPOFOL 10 MG/ML IV BOLUS
INTRAVENOUS | Status: DC | PRN
  Administered 2021-08-24 (×2): 20 mg via INTRAVENOUS

## 2021-08-24 SURGICAL SUPPLY — 15 items

## 2021-08-24 NOTE — H&P (Signed)
GASTROENTEROLOGY PROCEDURE H&P NOTE   Primary Care Physician: Aletha Halim., PA-C  HPI: Katherine Middleton is a 70 y.o. female who presents for repeat EGD/EUS attempt at [facility] mass-like area where there is a large blood vessel present - to see if able to find window to biopsy.  Past Medical History:  Diagnosis Date   Anxiety    Arthritis    Bipolar disorder ([place])    Depression    Fibromyalgia    GERD (gastroesophageal reflux disease) 12/31/2002   HLD (hyperlipidemia)    diet controlled - no meds   HSV infection    Liver mass 07/2021   Osteoarthritis    Prolonged Q-T interval on ECG 02/2021   Sinusitis 12/31/2012   Wears glasses    Past Surgical History:  Procedure Laterality Date   ESOPHAGOGASTRODUODENOSCOPY (EGD) WITH PROPOFOL N/A 08/11/2021   Procedure: ESOPHAGOGASTRODUODENOSCOPY (EGD) WITH PROPOFOL;  Surgeon: Carol Ada, MD;  Location: [facility];  Service: Endoscopy;  Laterality: N/A;   TONSILLECTOMY  04/08/57   some date around then   TUBAL LIGATION  10/21/1984   UPPER ESOPHAGEAL ENDOSCOPIC ULTRASOUND (EUS) N/A 08/11/2021   Procedure: UPPER ESOPHAGEAL ENDOSCOPIC ULTRASOUND (EUS);  Surgeon: Carol Ada, MD;  Location: [facility];  Service: Endoscopy;  Laterality: N/A;   No current facility-administered medications for this encounter.   No current facility-administered medications for this encounter. Allergies  Allergen Reactions   Cephalosporins Anaphylaxis    Other reaction(s): Unknown   Codeine Itching   Cymbalta [Duloxetine Hcl] Other (See Comments)    GERD   Duloxetine     Other reaction(s): Other (See Comments), Unknown GERD    Oxycodone Itching and Other (See Comments)    "OUT THERE", climbing the walls   Penicillins Anaphylaxis and Swelling    Throat swelling Reaction: 8-10 years ago   Baclofen Other (See Comments)    Anxiety got much worse and possibly ppted manic episode Other reaction(s): Unknown   Povidone Iodine Itching and Other  (See Comments)    Particularly if in a douche  Other reaction(s): Unknown   Amoxicillin-Pot Clavulanate Other (See Comments)     Unknown     Flonase [Fluticasone]     Makes eye pressure to increase   Propoxyphene     Other reaction(s): Other (See Comments), Unknown other    Sulfa Antibiotics Swelling   Sulfasalazine Swelling    Other reaction(s): Unknown   Tramadol     Other reaction(s): Other (See Comments), Unknown Patient states that this makes her nervous    Amitriptyline Other (See Comments)    Caused bowels to empty quickly and couldn't sleep on it. Other reaction(s): Other (See Comments), Unknown Caused bowels to empty quickly and couldn't sleep on it. Caused bowels to empty quickly and couldn't sleep on it. Caused bowels to empty quickly and couldn't sleep on it.    Aspirin Hives and Rash   Latex Swelling and Other (See Comments)    If in the mouth, it swells   Nsaids Nausea Only and Other (See Comments)    Almost passing out, bad acid reflux Other reaction(s): Other (See Comments) other   Tolmetin Nausea Only    Other reaction(s): Other (See Comments), Unknown Almost passing out, bad acid reflux Almost passing out, bad acid reflux    Family History  Problem Relation Age of Onset   Bipolar disorder Sister    Anxiety disorder Sister    Dementia Sister 32   Paranoid behavior Sister    Bipolar  disorder Father    Schizophrenia Father    Alcohol abuse Maternal Uncle    Alcohol abuse Paternal Uncle    Anxiety disorder Mother    ADD / ADHD Other    ADD / ADHD Other    Healthy Other    OCD Neg Hx    Seizures Neg Hx    Sexual abuse Neg Hx    Physical abuse Neg Hx    Social History   Socioeconomic History   Marital status: Married    Spouse name: Not on file   Number of children: Not on file   Years of education: Not on file   Highest education level: Not on file  Occupational History   Not on file  Tobacco Use   Smoking status: Every Day     Packs/day: 0.50    Years: 30.00    Pack years: 15.00    Types: Cigarettes   Smokeless tobacco: Never   Tobacco comments:    11-15 cigs a day as of 05/01/2013  Vaping Use   Vaping Use: Never used  Substance and Sexual Activity   Alcohol use: No   Drug use: No   Sexual activity: Yes    Partners: Male    Birth control/protection: Post-menopausal  Other Topics Concern   Not on file  Social History Narrative   Left handed    Lives alone    Social Determinants of Health   Financial Resource Strain: Not on file  Food Insecurity: Not on file  Transportation Needs: Not on file  Physical Activity: Not on file  Stress: Not on file  Social Connections: Not on file  Intimate Partner Violence: Not on file    Physical Exam: Vital signs in last 24 hours: Weight:  [44 kg] 44 kg (08/24 1054)   GEN: NAD EYE: Sclerae anicteric ENT: MMM CV: Non-tachycardic GI: Soft, NT/ND NEURO:  Alert & Oriented x 3  Lab Results: No results for input(s): WBC, HGB, HCT, PLT in the last 72 hours. BMET No results for input(s): NA, K, CL, CO2, GLUCOSE, BUN, CREATININE, CALCIUM in the last 72 hours. LFT No results for input(s): PROT, ALBUMIN, AST, ALT, ALKPHOS, BILITOT, BILIDIR, IBILI in the last 72 hours. PT/INR No results for input(s): LABPROT, INR in the last 72 hours.   Impression / Plan: This is a 70 y.o.female who presents for repeat EGD/EUS attempt at [facility] mass-like area where there is a large blood vessel present - to see if able to find window to biopsy.  The risks of an EUS including intestinal perforation, bleeding, infection, aspiration, and medication effects were discussed as was the possibility it may not give a definitive diagnosis if a biopsy is performed.  When a biopsy of the pancreas is done as part of the EUS, there is an additional risk of pancreatitis at the rate of about 1-2%.  It was explained that procedure related pancreatitis is typically mild, although it can be severe and  even life threatening, which is why we do not perform random pancreatic biopsies and only biopsy a lesion/area we feel is concerning enough to warrant the risk.   The risks and benefits of endoscopic evaluation/treatment were discussed with the patient and/or family; these include but are not limited to the risk of perforation, infection, bleeding, missed lesions, lack of diagnosis, severe illness requiring hospitalization, as well as anesthesia and sedation related illnesses.  The patient's history has been reviewed, patient examined, no change in status, and deemed stable for procedure.  The patient and/or family is agreeable to proceed.    Justice Britain, MD [facility] Advanced [facility]oscopy Office # [phone]

## 2021-08-24 NOTE — Op Note (Addendum)
[facility] Patient Name: Katherine Middleton Procedure Date : 08/24/2021 MRN: [id number] Attending MD: Justice Britain , MD Date of Birth: [birth year] CSN: [id number] Age: 70 Admit Type: Outpatient Procedure:                Upper EUS Indications:              Suspected mass in pancreas on MRCP, Weight loss Providers:                Justice Britain, MD, Jeanella Cara, RN,                            Elspeth Cho Tech., Technician, Claybon Jabs                            CRNA, CRNA Referring MD:             Carol Ada, MD, Aletha Halim PA-C, PA-C Medicines:                Monitored Anesthesia Care, Cipro 782 mg IV Complications:            No immediate complications. Estimated Blood Loss:     Estimated blood loss was minimal. Procedure:                Pre-Anesthesia Assessment:                           - Prior to the procedure, a History and Physical                            was performed, and patient medications and                            allergies were reviewed. The patient's tolerance of                            previous anesthesia was also reviewed. The risks                            and benefits of the procedure and the sedation                            options and risks were discussed with the patient.                            All questions were answered, and informed consent                            was obtained. Prior Anticoagulants: The patient has                            taken no previous anticoagulant or antiplatelet                            agents. ASA Grade Assessment: III - A patient with  severe systemic disease. After reviewing the risks                            and benefits, the patient was deemed in                            satisfactory condition to undergo the procedure.                           After obtaining informed consent, the endoscope was                            passed under  direct vision. Throughout the                            procedure, the patient's blood pressure, pulse, and                            oxygen saturations were monitored continuously. The                            [id number]-H190 ([id number]) Olympus endoscope was introduced                            through the mouth, and advanced to the second part                            of duodenum. The [id number]-Q180V ([id number]) Olympus                            duodenoscope was introduced through the mouth, and                            advanced to the area of papilla. The GF-[id number]                            ([id number]) Olympus linear ultrasound scope was                            introduced through the mouth, and advanced to the                            duodenum for ultrasound examination from the                            stomach and duodenum. The upper EUS was technically                            difficult and complex due to unusual anatomy.                            Successful completion of the procedure was aided by  performing the maneuvers documented (below) in this                            report. The patient tolerated the procedure. Scope In: Scope Out: Findings:      ENDOSCOPIC FINDING: :      White nummular lesions were noted in the entire esophagus. Biopsies were       taken with a cold forceps for histology to rule out Candida.      The Z-line was regular and was found 36 cm from the incisors.      A 2 cm hiatal hernia was present.      Patchy mild inflammation characterized by erosions and erythema was       found in the gastric body and in the gastric antrum.      No other gross lesions were noted in the entire examined stomach.       Biopsies were taken with a cold forceps for histology and Helicobacter       pylori testing.      No gross lesions were noted in the duodenal bulb, in the first portion       of the duodenum and in the second portion of the  duodenum.      The major papilla was normal.      ENDOSONOGRAPHIC FINDING: :      An irregular appearing mass-like area was identified in the pancreatic       head/uncinate process of the pancreas. The area was noted to be near       completely blocked by the SMV. The region was slightly hypoechoic       compared to the rest of the atrophic pancreas. This area was       approximately 25 mm by 20 mm in maximal cross-sectional diameter. The       endosonographic borders were poorly-defined. There was sonographic       evidence suggesting abutment of the portal vein as well. An intact       interface was seen between the mass and the superior mesenteric artery       and celiac trunk suggesting a lack of invasion. The remainder of the       pancreas was examined. The endosonographic appearance of parenchyma and       some mild upstream pancreatic duct indicated duct dilation and       significant parenchymal atrophy. After over torquing laterally to the       left, I found a small window to attempt sampling of a more concerning       region, but overall amount of tissue/window was felt to be small. Fine       needle biopsy was performed. Color Doppler imaging was utilized prior to       needle puncture to confirm a lack of significant vascular structures       within the needle path. Seven passes were made with the Acquire 22 gauge       ultrasound core biopsy needle using a transduodenal approach. Small       visible cores of tissue were obtained. Touch preps were performed. Final       cytology results are pending.      There was no sign of significant endosonographic abnormality in the       common bile duct. No stones and no biliary sludge were identified.  No malignant-appearing lymph nodes were visualized in the celiac region       (level 20), peripancreatic region and porta hepatis region.      A cyst was found in the visualized portion of the liver and measured 14       mm by 11  mm in maximal cross-sectional diameter. The cyst was anechoic.       It was without septae. The outer wall of the lesion was not seen.      Endosonographic imaging in the visualized portion of the liver showed no       mass.      The celiac region was visualized. Impression:               EGD Impression:                           - White nummular lesions in esophageal mucosa.                            Biopsied.                           - Z-line regular, 36 cm from the incisors.                           - 2 cm hiatal hernia.                           - Gastritis in the distal stomach. No other gross                            lesions in the stomach. Biopsied.                           - No gross lesions in the duodenal bulb, in the                            first portion of the duodenum and in the second                            portion of the duodenum.                           - Normal major papilla.                           EUS Impression:                           - A mass-like area was identified in the pancreatic                            head/uncinate region. Finding an appropriate window                            for attempt at EUS sampling was not easy. Cytology  results are pending. However, the endosonographic                            appearance is concerning as well as the clinical                            picture for possible adenocarcinoma. Fine needle                            biopsy performed.                           This was staged T2 N0 Mx by endosonographic                            criteria if malignancy is confirmed.                           - There was no sign of significant pathology in the                            common bile duct.                           - No malignant-appearing lymph nodes were                            visualized in the celiac region (level 20),                            peripancreatic region  and porta hepatis region.                           - A cyst was found in the visualized portion of the                            liver but no other masses/lesions. Recommendation:           - The patient will be observed post-procedure,                            until all discharge criteria are met.                           - Discharge patient to home.                           - Patient has a contact number available for                            emergencies. The signs and symptoms of potential                            delayed complications were discussed with the  patient. Return to normal activities tomorrow.                            Written discharge instructions were provided to the                            patient.                           - Low fat diet for 1 week.                           - Observe patient's clinical course.                           - Ciprofloxacin 500 mg twice daily for 3-days.                           - Await cytology results and await path results.                           - Return to referring physician as previously                            scheduled.                           - The findings and recommendations were discussed                            with the patient.                           - The findings and recommendations were discussed                            with the patient's family. Procedure Code(s):        --- Professional ---                           [phone], Esophagogastroduodenoscopy, flexible,                            transoral; with transendoscopic ultrasound-guided                            intramural or transmural fine needle                            aspiration/biopsy(s), (includes endoscopic                            ultrasound examination limited to the esophagus,                            stomach or duodenum, and adjacent structures) Diagnosis Code(s):        --- Professional ---  K22.8, Other specified diseases of esophagus                           K44.9, Diaphragmatic hernia without obstruction or                            gangrene                           K29.70, Gastritis, unspecified, without bleeding                           K86.89, Other specified diseases of pancreas                           I89.9, Noninfective disorder of lymphatic vessels                            and lymph nodes, unspecified                           K76.89, Other specified diseases of liver                           R63.4, Abnormal weight loss                           R93.3, Abnormal findings on diagnostic imaging of                            other parts of digestive tract CPT copyright 2019 [employer]. All rights reserved. The codes documented in this report are preliminary and upon coder review may  be revised to meet current compliance requirements. Justice Britain, MD 08/24/2021 11:46:10 AM Number of Addenda: 0

## 2021-08-24 NOTE — Discharge Instructions (Signed)
YOU HAD AN ENDOSCOPIC PROCEDURE TODAY: Refer to the procedure report and other information in the discharge instructions given to you for any specific questions about what was found during the examination. If this information does not answer your questions, please call [place] office at [phone] to clarify.   YOU SHOULD EXPECT: Some feelings of bloating in the abdomen. Passage of more gas than usual. Walking can help get rid of the air that was put into your GI tract during the procedure and reduce the bloating. If you had a lower endoscopy (such as a colonoscopy or flexible sigmoidoscopy) you may notice spotting of blood in your stool or on the toilet paper. Some abdominal soreness may be present for a day or two, also.  DIET: Your first meal following the procedure should be a light meal and then it is ok to progress to your normal diet. A half-sandwich or bowl of soup is an example of a good first meal. Heavy or fried foods are harder to digest and may make you feel nauseous or bloated. Drink plenty of fluids but you should avoid alcoholic beverages for 24 hours. If you had a esophageal dilation, please see attached instructions for diet.    ACTIVITY: Your care partner should take you home directly after the procedure. You should plan to take it easy, moving slowly for the rest of the day. You can resume normal activity the day after the procedure however YOU SHOULD NOT DRIVE, use power tools, machinery or perform tasks that involve climbing or major physical exertion for 24 hours (because of the sedation medicines used during the test).   SYMPTOMS TO REPORT IMMEDIATELY: A gastroenterologist can be reached at any hour. Please call [phone]  for any of the following symptoms:   Following upper endoscopy (EGD, EUS, ERCP, esophageal dilation) Vomiting of blood or coffee ground material  New, significant abdominal pain  New, significant chest pain or pain under the shoulder blades  Painful or  persistently difficult swallowing  New shortness of breath  Black, tarry-looking or red, bloody stools  FOLLOW UP:  If any biopsies were taken you will be contacted by phone or by letter within the next 1-3 weeks. Call [phone]  if you have not heard about the biopsies in 3 weeks.  Please also call with any specific questions about appointments or follow up tests.  

## 2021-08-24 NOTE — Anesthesia Postprocedure Evaluation (Signed)
Anesthesia Post Note  Patient: Katherine Middleton  Procedure(s) Performed: ESOPHAGOGASTRODUODENOSCOPY (EGD) WITH PROPOFOL UPPER ENDOSCOPIC ULTRASOUND (EUS) LINEAR BIOPSY FINE NEEDLE ASPIRATION     Patient location during evaluation: Endoscopy Anesthesia Type: MAC Level of consciousness: awake and alert Pain management: pain level controlled Vital Signs Assessment: post-procedure vital signs reviewed and stable Respiratory status: spontaneous breathing, nonlabored ventilation, respiratory function stable and patient connected to nasal cannula oxygen Cardiovascular status: stable and blood pressure returned to baseline Postop Assessment: no apparent nausea or vomiting Anesthetic complications: no   No notable events documented.  Last Vitals:  Vitals:   08/24/21 1140 08/24/21 1144  BP: (!) 165/53 (!) (P) 165/58  Pulse: (!) 50 (!) (P) 52  Resp: 18 (P) 14  Temp:    SpO2: 97% (P) 96%    Last Pain:  Vitals:   08/24/21 1140  TempSrc:   PainSc: 0-No pain                 Belenda Cruise P Donyell Ding

## 2021-08-24 NOTE — Anesthesia Preprocedure Evaluation (Signed)
Anesthesia Evaluation  Patient identified by MRN, date of birth, ID band Patient awake    Reviewed: Allergy & Precautions, NPO status , Patient's Chart, lab work & pertinent test results  Airway Mallampati: II  TM Distance: >3 FB Neck ROM: Full    Dental  (+) Teeth Intact   Pulmonary neg pulmonary ROS, Current Smoker,    Pulmonary exam normal        Cardiovascular negative cardio ROS   Rhythm:Regular Rate:Normal     Neuro/Psych Anxiety Depression Bipolar Disorder negative neurological ROS     GI/Hepatic Neg liver ROS, GERD  Medicated,Pancreatic lesion    Endo/Other  negative endocrine ROS  Renal/GU negative Renal ROS  negative genitourinary   Musculoskeletal  (+) Arthritis , Osteoarthritis,  Fibromyalgia -  Abdominal (+)  Abdomen: soft. Bowel sounds: normal.  Peds  Hematology negative hematology ROS (+)   Anesthesia Other Findings   Reproductive/Obstetrics                             Anesthesia Physical Anesthesia Plan  ASA: 2  Anesthesia Plan: MAC   Post-op Pain Management:    Induction: Intravenous  PONV Risk Score and Plan: 1 and Propofol infusion and Treatment may vary due to age or medical condition  Airway Management Planned: Simple Face Mask, Natural Airway and Nasal Cannula  Additional Equipment: None  Intra-op Plan:   Post-operative Plan:   Informed Consent: I have reviewed the patients History and Physical, chart, labs and discussed the procedure including the risks, benefits and alternatives for the proposed anesthesia with the patient or authorized representative who has indicated his/her understanding and acceptance.     Dental advisory given  Plan Discussed with: CRNA  Anesthesia Plan Comments:         Anesthesia Quick Evaluation

## 2021-08-24 NOTE — Transfer of Care (Signed)
Immediate Anesthesia Transfer of Care Note  Patient: Katherine Middleton  Procedure(s) Performed: ESOPHAGOGASTRODUODENOSCOPY (EGD) WITH PROPOFOL UPPER ENDOSCOPIC ULTRASOUND (EUS) LINEAR BIOPSY FINE NEEDLE ASPIRATION  Patient Location: Endoscopy Unit  Anesthesia Type:MAC  Level of Consciousness: drowsy and patient cooperative  Airway & Oxygen Therapy: Patient Spontanous Breathing and Patient connected to nasal cannula oxygen  Post-op Assessment: Report given to RN and Post -op Vital signs reviewed and stable  Post vital signs: Reviewed and stable  Last Vitals:  Vitals Value Taken Time  BP    Temp    Pulse    Resp    SpO2      Last Pain:  Vitals:   08/24/21 1000  TempSrc: Temporal  PainSc: 0-No pain         Complications: No notable events documented.

## 2021-08-25 ENCOUNTER — Encounter (HOSPITAL_COMMUNITY): Payer: Self-pay | Admitting: Gastroenterology

## 2021-08-25 ENCOUNTER — Encounter: Payer: Self-pay | Admitting: Gastroenterology

## 2021-08-25 LAB — SURGICAL PATHOLOGY

## 2021-08-29 ENCOUNTER — Telehealth: Payer: Self-pay | Admitting: Gastroenterology

## 2021-08-29 LAB — CYTOLOGY - NON PAP

## 2021-08-29 NOTE — Telephone Encounter (Signed)
Dr Rush Landmark the pt son called and states that he was told to call and remind you that you were going to call him to discuss his moms pathology results.

## 2021-08-29 NOTE — Telephone Encounter (Signed)
Pt would like to speak with Dr. Rush Landmark regarding his mother's EUS results.

## 2021-08-30 NOTE — Telephone Encounter (Signed)
Will review these results as they return and make recommendations thereafter. Thanks. GM

## 2021-08-31 ENCOUNTER — Other Ambulatory Visit: Payer: Self-pay

## 2021-08-31 DIAGNOSIS — K869 Disease of pancreas, unspecified: Secondary | ICD-10-CM

## 2021-09-05 ENCOUNTER — Telehealth: Payer: Self-pay | Admitting: Gastroenterology

## 2021-09-05 ENCOUNTER — Other Ambulatory Visit: Payer: Self-pay

## 2021-09-05 DIAGNOSIS — K869 Disease of pancreas, unspecified: Secondary | ICD-10-CM

## 2021-09-06 NOTE — Telephone Encounter (Signed)
I have advised the pt and son that no appt is needed for labs. The orders have been entered and pt can come at her convenience.

## 2021-09-08 ENCOUNTER — Other Ambulatory Visit: Payer: Self-pay

## 2021-09-11 ENCOUNTER — Ambulatory Visit (HOSPITAL_COMMUNITY): Payer: Medicare HMO | Admitting: Registered Nurse

## 2021-09-11 ENCOUNTER — Encounter (HOSPITAL_COMMUNITY)
Admission: RE | Disposition: A | Discharge status: Home / Self Care | Payer: Self-pay | Source: Home / Self Care | Attending: Gastroenterology

## 2021-09-11 ENCOUNTER — Ambulatory Visit (HOSPITAL_COMMUNITY)
Admission: RE | Admit: 2021-09-11 | Discharge: 2021-09-11 | Disposition: A | Discharge status: Home / Self Care | Payer: Medicare HMO | Attending: Gastroenterology | Admitting: Gastroenterology

## 2021-09-11 ENCOUNTER — Encounter (HOSPITAL_COMMUNITY): Payer: Self-pay | Admitting: Gastroenterology

## 2021-09-11 ENCOUNTER — Other Ambulatory Visit (INDEPENDENT_AMBULATORY_CARE_PROVIDER_SITE_OTHER): Payer: Medicare HMO

## 2021-09-11 ENCOUNTER — Other Ambulatory Visit: Payer: Self-pay

## 2021-09-11 DIAGNOSIS — F17210 Nicotine dependence, cigarettes, uncomplicated: Secondary | ICD-10-CM | POA: Diagnosis not present

## 2021-09-11 DIAGNOSIS — R8569 Abnormal cytological findings in specimens from other digestive organs and abdominal cavity: Secondary | ICD-10-CM | POA: Insufficient documentation

## 2021-09-11 DIAGNOSIS — K3189 Other diseases of stomach and duodenum: Secondary | ICD-10-CM | POA: Diagnosis not present

## 2021-09-11 DIAGNOSIS — K31A0 Gastric intestinal metaplasia, unspecified: Secondary | ICD-10-CM | POA: Insufficient documentation

## 2021-09-11 DIAGNOSIS — Z880 Allergy status to penicillin: Secondary | ICD-10-CM | POA: Diagnosis not present

## 2021-09-11 DIAGNOSIS — K2289 Other specified disease of esophagus: Secondary | ICD-10-CM | POA: Diagnosis not present

## 2021-09-11 DIAGNOSIS — K2980 Duodenitis without bleeding: Secondary | ICD-10-CM | POA: Diagnosis not present

## 2021-09-11 DIAGNOSIS — Z91040 Latex allergy status: Secondary | ICD-10-CM | POA: Diagnosis not present

## 2021-09-11 DIAGNOSIS — Z881 Allergy status to other antibiotic agents status: Secondary | ICD-10-CM | POA: Insufficient documentation

## 2021-09-11 DIAGNOSIS — K8689 Other specified diseases of pancreas: Secondary | ICD-10-CM | POA: Diagnosis not present

## 2021-09-11 DIAGNOSIS — R933 Abnormal findings on diagnostic imaging of other parts of digestive tract: Secondary | ICD-10-CM | POA: Insufficient documentation

## 2021-09-11 DIAGNOSIS — E785 Hyperlipidemia, unspecified: Secondary | ICD-10-CM | POA: Insufficient documentation

## 2021-09-11 DIAGNOSIS — Z882 Allergy status to sulfonamides status: Secondary | ICD-10-CM | POA: Diagnosis not present

## 2021-09-11 DIAGNOSIS — I899 Noninfective disorder of lymphatic vessels and lymph nodes, unspecified: Secondary | ICD-10-CM | POA: Diagnosis not present

## 2021-09-11 DIAGNOSIS — Z885 Allergy status to narcotic agent status: Secondary | ICD-10-CM | POA: Insufficient documentation

## 2021-09-11 DIAGNOSIS — Z888 Allergy status to other drugs, medicaments and biological substances status: Secondary | ICD-10-CM | POA: Diagnosis not present

## 2021-09-11 DIAGNOSIS — Z886 Allergy status to analgesic agent status: Secondary | ICD-10-CM | POA: Insufficient documentation

## 2021-09-11 DIAGNOSIS — K869 Disease of pancreas, unspecified: Secondary | ICD-10-CM

## 2021-09-11 HISTORY — PX: EUS: SHX5427

## 2021-09-11 HISTORY — PX: FINE NEEDLE ASPIRATION: SHX5430

## 2021-09-11 HISTORY — PX: ESOPHAGOGASTRODUODENOSCOPY (EGD) WITH PROPOFOL: SHX5813

## 2021-09-11 HISTORY — PX: BIOPSY: SHX5522

## 2021-09-11 LAB — COMPREHENSIVE METABOLIC PANEL
ALT: 9 U/L (ref 0–35)
AST: 17 U/L (ref 0–37)
Albumin: 3.1 g/dL — ABNORMAL LOW (ref 3.5–5.2)
Alkaline Phosphatase: 79 U/L (ref 39–117)
BUN: 7 mg/dL (ref 6–23)
CO2: 34 mEq/L — ABNORMAL HIGH (ref 19–32)
Calcium: 9.3 mg/dL (ref 8.4–10.5)
Chloride: 106 mEq/L (ref 96–112)
Creatinine, Ser: 0.5 mg/dL (ref 0.40–1.20)
GFR: 95.02 mL/min (ref >60.00)
Glucose, Bld: 90 mg/dL (ref 70–99)
Potassium: 3.9 mEq/L (ref 3.5–5.1)
Sodium: 144 mEq/L (ref 135–145)
Total Bilirubin: 0.4 mg/dL (ref 0.2–1.2)
Total Protein: 5.6 g/dL — ABNORMAL LOW (ref 6.0–8.3)

## 2021-09-11 SURGERY — ESOPHAGOGASTRODUODENOSCOPY (EGD) WITH PROPOFOL
Anesthesia: Monitor Anesthesia Care

## 2021-09-11 MED ORDER — PROPOFOL 500 MG/50ML IV EMUL
INTRAVENOUS | Status: DC | PRN
  Administered 2021-09-11: 120 ug/kg/min via INTRAVENOUS

## 2021-09-11 MED ORDER — SODIUM CHLORIDE 0.9 % IV SOLN: INTRAVENOUS | Status: DC

## 2021-09-11 MED ORDER — LACTATED RINGERS IV SOLN
INTRAVENOUS | Status: AC | PRN
  Administered 2021-09-11: 1000 mL via INTRAVENOUS

## 2021-09-11 MED ORDER — CIPROFLOXACIN IN D5W 400 MG/200ML IV SOLN
INTRAVENOUS | Status: AC
  Filled 2021-09-11: qty 200

## 2021-09-11 MED ORDER — PROPOFOL 1000 MG/100ML IV EMUL
INTRAVENOUS | Status: AC
  Filled 2021-09-11: qty 100

## 2021-09-11 MED ORDER — PROPOFOL 10 MG/ML IV BOLUS
INTRAVENOUS | Status: DC | PRN
  Administered 2021-09-11 (×2): 20 mg via INTRAVENOUS

## 2021-09-11 MED ORDER — CIPROFLOXACIN IN D5W 400 MG/200ML IV SOLN
INTRAVENOUS | Status: DC | PRN
  Administered 2021-09-11: 400 mg via INTRAVENOUS

## 2021-09-11 SURGICAL SUPPLY — 15 items

## 2021-09-11 NOTE — H&P (Signed)
GASTROENTEROLOGY PROCEDURE H&P NOTE   Primary Care Physician: Aletha Halim., PA-C  HPI: Katherine Middleton is a 70 y.o. female who presents for EGD/EUS to re-evaluate pancreatic head abnormality with atypical cells, concerning for potentially still hiding malignancy.  Past Medical History:  Diagnosis Date   Anxiety    Arthritis    Bipolar disorder ([place])    Depression    Fibromyalgia    GERD (gastroesophageal reflux disease) 12/31/2002   HLD (hyperlipidemia)    diet controlled - no meds   HSV infection    Liver mass 07/2021   Osteoarthritis    Prolonged Q-T interval on ECG 02/2021   Sinusitis 12/31/2012   Wears glasses    Past Surgical History:  Procedure Laterality Date   BIOPSY  08/24/2021   Procedure: BIOPSY;  Surgeon: Irving Copas., MD;  Location: [facility] ENDOSCOPY;  Service: Gastroenterology;;   ESOPHAGOGASTRODUODENOSCOPY (EGD) WITH PROPOFOL N/A 08/11/2021   Procedure: ESOPHAGOGASTRODUODENOSCOPY (EGD) WITH PROPOFOL;  Surgeon: Carol Ada, MD;  Location: [facility] ENDOSCOPY;  Service: Endoscopy;  Laterality: N/A;   ESOPHAGOGASTRODUODENOSCOPY (EGD) WITH PROPOFOL N/A 08/24/2021   Procedure: ESOPHAGOGASTRODUODENOSCOPY (EGD) WITH PROPOFOL;  Surgeon: Rush Landmark Telford Nab., MD;  Location: [place];  Service: Gastroenterology;  Laterality: N/A;   EUS N/A 08/24/2021   Procedure: UPPER ENDOSCOPIC ULTRASOUND (EUS) LINEAR;  Surgeon: Irving Copas., MD;  Location: [place];  Service: Gastroenterology;  Laterality: N/A;   FINE NEEDLE ASPIRATION  08/24/2021   Procedure: FINE NEEDLE ASPIRATION;  Surgeon: Rush Landmark Telford Nab., MD;  Location: [place];  Service: Gastroenterology;;   TONSILLECTOMY  04/08/57   some date around then   TUBAL LIGATION  10/21/1984   UPPER ESOPHAGEAL ENDOSCOPIC ULTRASOUND (EUS) N/A 08/11/2021   Procedure: UPPER ESOPHAGEAL ENDOSCOPIC ULTRASOUND (EUS);  Surgeon: Carol Ada, MD;  Location: [facility]OS[facility];  Service: Endoscopy;  Laterality:  N/A;   Current Facility-Administered Medications  Medication Dose Route Frequency Provider Last Rate Last Admin   0.9 %  sodium chloride infusion   Intravenous Continuous Mansouraty, Telford Nab., MD        Current Facility-Administered Medications:    0.9 %  sodium chloride infusion, , Intravenous, Continuous, Mansouraty, Telford Nab., MD Allergies  Allergen Reactions   Amoxicillin-Pot Clavulanate Anaphylaxis and Swelling   Cephalosporins Anaphylaxis    Other reaction(s): Unknown   Codeine Itching   Cymbalta [Duloxetine Hcl] Other (See Comments)    GERD   Duloxetine     Other reaction(s): Other (See Comments), Unknown GERD    Oxycodone Itching and Other (See Comments)    "OUT THERE", climbing the walls   Penicillins Anaphylaxis and Swelling    Throat swelling Reaction: 8-10 years ago   Baclofen Other (See Comments)    Anxiety got much worse and possibly ppted manic episode Other reaction(s): Unknown   Povidone Iodine Itching and Other (See Comments)    Particularly if in a douche  Other reaction(s): Unknown   Flonase [Fluticasone]     Makes eye pressure to increase   Propoxyphene Itching    Anxiety/jittery    Sulfa Antibiotics Swelling   Sulfasalazine Swelling    Other reaction(s): Unknown   Tramadol     Other reaction(s): Other (See Comments), Unknown Patient states that this makes her nervous    Amitriptyline Other (See Comments)    Caused bowels to empty quickly and couldn't sleep on it. Other reaction(s): Other (See Comments), Unknown Caused bowels to empty quickly and couldn't sleep on it. Caused bowels to empty quickly and couldn't  sleep on it. Caused bowels to empty quickly and couldn't sleep on it.    Aspirin Hives and Rash   Latex Swelling and Other (See Comments)    If in the mouth, it swells   Nsaids Nausea Only and Other (See Comments)    Almost passing out, bad acid reflux Other reaction(s): Other (See Comments) other   Tolmetin Nausea Only     Other reaction(s): Other (See Comments), Unknown Almost passing out, bad acid reflux Almost passing out, bad acid reflux    Family History  Problem Relation Age of Onset   Bipolar disorder Sister    Anxiety disorder Sister    Dementia Sister 72   Paranoid behavior Sister    Bipolar disorder Father    Schizophrenia Father    Alcohol abuse Maternal Uncle    Alcohol abuse Paternal Uncle    Anxiety disorder Mother    ADD / ADHD Other    ADD / ADHD Other    Healthy Other    OCD Neg Hx    Seizures Neg Hx    Sexual abuse Neg Hx    Physical abuse Neg Hx    Social History   Socioeconomic History   Marital status: Married    Spouse name: Not on file   Number of children: Not on file   Years of education: Not on file   Highest education level: Not on file  Occupational History   Not on file  Tobacco Use   Smoking status: Every Day    Packs/day: 0.50    Years: 30.00    Pack years: 15.00    Types: Cigarettes   Smokeless tobacco: Never   Tobacco comments:    11-15 cigs a day as of 05/01/2013  Vaping Use   Vaping Use: Never used  Substance and Sexual Activity   Alcohol use: No   Drug use: No   Sexual activity: Yes    Partners: Male    Birth control/protection: Post-menopausal  Other Topics Concern   Not on file  Social History Narrative   Left handed    Lives alone    Social Determinants of Health   Financial Resource Strain: Not on file  Food Insecurity: Not on file  Transportation Needs: Not on file  Physical Activity: Not on file  Stress: Not on file  Social Connections: Not on file  Intimate Partner Violence: Not on file    Physical Exam: Vital signs in last 24 hours:     GEN: NAD EYE: Sclerae anicteric ENT: MMM CV: Non-tachycardic GI: Soft, NT/ND NEURO:  Alert & Oriented x 3  Lab Results: No results for input(s): WBC, HGB, HCT, PLT in the last 72 hours. BMET No results for input(s): NA, K, CL, CO2, GLUCOSE, BUN, CREATININE, CALCIUM in the last 72  hours. LFT No results for input(s): PROT, ALBUMIN, AST, ALT, ALKPHOS, BILITOT, BILIDIR, IBILI in the last 72 hours. PT/INR No results for input(s): LABPROT, INR in the last 72 hours.   Impression / Plan: This is a 70 y.o.female who presents for EGD/EUS to re-evaluate pancreatic head abnormality with atypical cells, concerning for potentially still hiding malignancy.  The risks of an EUS including intestinal perforation, bleeding, infection, aspiration, and medication effects were discussed as was the possibility it may not give a definitive diagnosis if a biopsy is performed.  When a biopsy of the pancreas is done as part of the EUS, there is an additional risk of pancreatitis at the rate of about 1-2%.  It was explained that procedure related pancreatitis is typically mild, although it can be severe and even life threatening, which is why we do not perform random pancreatic biopsies and only biopsy a lesion/area we feel is concerning enough to warrant the risk.  The risks and benefits of endoscopic evaluation/treatment were discussed with the patient and/or family; these include but are not limited to the risk of perforation, infection, bleeding, missed lesions, lack of diagnosis, severe illness requiring hospitalization, as well as anesthesia and sedation related illnesses.  The patient's history has been reviewed, patient examined, no change in status, and deemed stable for procedure.  The patient and/or family is agreeable to proceed.    Justice Britain, MD [facility] Office # [phone]

## 2021-09-11 NOTE — Transfer of Care (Signed)
Immediate Anesthesia Transfer of Care Note  Patient: Katherine Middleton  Procedure(s) Performed: ESOPHAGOGASTRODUODENOSCOPY (EGD) WITH PROPOFOL UPPER ENDOSCOPIC ULTRASOUND (EUS) LINEAR FINE NEEDLE ASPIRATION (FNA) LINEAR BIOPSY  Patient Location: PACU and Endoscopy Unit  Anesthesia Type:MAC  Level of Consciousness: awake, alert , oriented and patient cooperative  Airway & Oxygen Therapy: Patient Spontanous Breathing and Patient connected to face mask oxygen  Post-op Assessment: Report given to RN, Post -op Vital signs reviewed and stable and Patient moving all extremities  Post vital signs: Reviewed and stable  Last Vitals:  Vitals Value Taken Time  BP    Temp    Pulse    Resp    SpO2      Last Pain:  Vitals:   09/11/21 1046  TempSrc: Oral  PainSc: 0-No pain         Complications: No notable events documented.

## 2021-09-11 NOTE — Anesthesia Preprocedure Evaluation (Signed)
Anesthesia Evaluation  Patient identified by MRN, date of birth, ID band Patient awake    Reviewed: Allergy & Precautions, NPO status , Patient's Chart, lab work & pertinent test results  Airway Mallampati: II  TM Distance: >3 FB Neck ROM: Full    Dental no notable dental hx.    Pulmonary Current Smoker,    Pulmonary exam normal breath sounds clear to auscultation       Cardiovascular Normal cardiovascular exam Rhythm:Regular Rate:Normal  Prolonged QT   Neuro/Psych Bipolar Disorder negative neurological ROS     GI/Hepatic Neg liver ROS, GERD  ,  Endo/Other  negative endocrine ROS  Renal/GU negative Renal ROS  negative genitourinary   Musculoskeletal negative musculoskeletal ROS (+)   Abdominal   Peds negative pediatric ROS (+)  Hematology negative hematology ROS (+)   Anesthesia Other Findings   Reproductive/Obstetrics negative OB ROS                             Anesthesia Physical Anesthesia Plan  ASA: 2  Anesthesia Plan: MAC   Post-op Pain Management:    Induction: Intravenous  PONV Risk Score and Plan: 2 and Propofol infusion and Treatment may vary due to age or medical condition  Airway Management Planned: Simple Face Mask  Additional Equipment:   Intra-op Plan:   Post-operative Plan:   Informed Consent: I have reviewed the patients History and Physical, chart, labs and discussed the procedure including the risks, benefits and alternatives for the proposed anesthesia with the patient or authorized representative who has indicated his/her understanding and acceptance.     Dental advisory given  Plan Discussed with: CRNA and Surgeon  Anesthesia Plan Comments:         Anesthesia Quick Evaluation

## 2021-09-11 NOTE — Discharge Instructions (Signed)
YOU HAD AN ENDOSCOPIC PROCEDURE TODAY: Refer to the procedure report and other information in the discharge instructions given to you for any specific questions about what was found during the examination. If this information does not answer your questions, please call [place] office at [phone] to clarify.  ° °YOU SHOULD EXPECT: Some feelings of bloating in the abdomen. Passage of more gas than usual. Walking can help get rid of the air that was put into your GI tract during the procedure and reduce the bloating.  ° °DIET: Your first meal following the procedure should be a light meal and then it is ok to progress to your normal diet. A half-sandwich or bowl of soup is an example of a good first meal. Heavy or fried foods are harder to digest and may make you feel nauseous or bloated. Drink plenty of fluids but you should avoid alcoholic beverages for 24 hours. ° °ACTIVITY: Your care partner should take you home directly after the procedure. You should plan to take it easy, moving slowly for the rest of the day. You can resume normal activity the day after the procedure however YOU SHOULD NOT DRIVE, use power tools, machinery or perform tasks that involve climbing or major physical exertion for 24 hours (because of the sedation medicines used during the test).  ° °SYMPTOMS TO REPORT IMMEDIATELY: °A gastroenterologist can be reached at any hour. Please call [phone]  for any of the following symptoms:  ° °Following upper endoscopy (EGD, EUS, ERCP, esophageal dilation) °Vomiting of blood or coffee ground material  °New, significant abdominal pain  °New, significant chest pain or pain under the shoulder blades  °Painful or persistently difficult swallowing  °New shortness of breath  °Black, tarry-looking or red, bloody stools ° °FOLLOW UP:  °If any biopsies were taken you will be contacted by phone or by letter within the next 1-3 weeks. Call [phone]  if you have not heard about the biopsies in 3 weeks.    °Please also call with any specific questions about appointments or follow up tests. ° °

## 2021-09-11 NOTE — Anesthesia Postprocedure Evaluation (Signed)
Anesthesia Post Note  Patient: Reniah Cottingham  Procedure(s) Performed: ESOPHAGOGASTRODUODENOSCOPY (EGD) WITH PROPOFOL UPPER ENDOSCOPIC ULTRASOUND (EUS) LINEAR FINE NEEDLE ASPIRATION (FNA) LINEAR BIOPSY     Patient location during evaluation: PACU Anesthesia Type: MAC Level of consciousness: awake and alert Pain management: pain level controlled Vital Signs Assessment: post-procedure vital signs reviewed and stable Respiratory status: spontaneous breathing, nonlabored ventilation, respiratory function stable and patient connected to nasal cannula oxygen Cardiovascular status: stable and blood pressure returned to baseline Postop Assessment: no apparent nausea or vomiting Anesthetic complications: no   No notable events documented.  Last Vitals:  Vitals:   09/11/21 1308 09/11/21 1318  BP: (!) 138/49 (!) 120/55  Pulse: (!) 56 (!) 43  Resp: (!) 23 15  Temp:    SpO2: 100% 96%    Last Pain:  Vitals:   09/11/21 1318  TempSrc:   PainSc: 0-No pain                 Dezyrae Kensinger S

## 2021-09-11 NOTE — Op Note (Signed)
[facility] Patient Name: Katherine Middleton Procedure Date: 09/11/2021 MRN: [id number] Attending MD: Justice Britain , MD Date of Birth: [birth year] CSN: [id number] Age: 70 Admit Type: Outpatient Procedure:                Upper EUS Indications:              Suspected mass in pancreas on MRCP Providers:                Justice Britain, MD, Elmer Ramp. Tilden Dome, RN,                            Laverda Sorenson, Technician, Courtney Heys. [place], CRNA Referring MD:             Carol Ada, MD Medicines:                Monitored Anesthesia Care, Cipro 276 mg IV Complications:            No immediate complications. Estimated Blood Loss:     Estimated blood loss was minimal. Procedure:                Pre-Anesthesia Assessment:                           - Prior to the procedure, a History and Physical                            was performed, and patient medications and                            allergies were reviewed. The patient's tolerance of                            previous anesthesia was also reviewed. The risks                            and benefits of the procedure and the sedation                            options and risks were discussed with the patient.                            All questions were answered, and informed consent                            was obtained. Prior Anticoagulants: The patient has                            taken no previous anticoagulant or antiplatelet                            agents. ASA Grade Assessment: III - A patient with                            severe systemic disease. After reviewing the risks  and benefits, the patient was deemed in                            satisfactory condition to undergo the procedure.                           After obtaining informed consent, the endoscope was                            passed under direct vision. Throughout the                            procedure, the patient's  blood pressure, pulse, and                            oxygen saturations were monitored continuously. The                            [id number]-H190 ([id number]) Olympus endoscope was introduced                            through the mouth, and advanced to the second part                            of duodenum. The [id number]-Q180V ([id number]) Olympus                            duodenoscope was introduced through the mouth, and                            advanced to the area of papilla. The GF-UCT180                            ([id number]) Olympus linear ultrasound scope was                            introduced through the mouth, and advanced to the                            duodenum for ultrasound examination from the                            stomach and duodenum. The upper EUS was technically                            difficult and complex. The patient tolerated the                            procedure. The TGF-[id number] ([id number]) Olympus Forward                            View EUS was introduced through the mouth, and  advanced to the duodenum for ultrasound examination. Scope In: Scope Out: Findings:      ENDOSCOPIC FINDING: :      No gross lesions were noted in the entire esophagus.      The Z-line was irregular and was found 40 cm from the incisors.      Patchy mildly erythematous mucosa without bleeding was found in the       entire examined stomach.      A single 12 mm nodule was found just proximal to the minor papilla.       Biopsies were taken with a cold forceps for histology to rule out       adenomatous change.      The minor papilla was otherwise normal.      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 Once it had drained, the appearance of the       ampulla was otherwise normal.      ENDOSONOGRAPHIC FINDING: :      An irregular region was identified in the pancreatic head. The area was       heterogenous with  some hypoechogenicity present. The area measured 18 mm       by 8 mm in maximal cross-sectional diameter. The endosonographic borders       were poorly-defined. There was sonographic evidence suggesting abutment       of the portal vein and the superior mesenteric vein. An intact interface       was seen between the celiac trunk suggesting a lack of invasion. The       remainder of the pancreas was examined. The endosonographic appearance       of parenchyma and the upstream pancreatic duct indicated duct dilation       and parenchymal atrophy. Fine needle biopsy was performed of the region       that could be reached while over-torquing to not be in line with the       SMV. Color Doppler imaging was utilized prior to needle puncture to       confirm a lack of significant vascular structures within the needle       path. Six passes were made with the 22 gauge SharkCore biopsy needle       using a transduodenal approach. Visible cores of tissue were obtained.       Preliminary cytologic examination and touch preps were performed. Final       cytology results are pending.      Endosonographic imaging in the visualized portion of the liver showed no       mass.      No malignant-appearing lymph nodes were visualized in the celiac region       (level 20), peripancreatic region and porta hepatis region.      The celiac region was visualized. Impression:               EGD Impression:                           - No gross lesions in esophagus. Z-line irregular,                            40 cm from the incisors.                           -  Erythematous mucosa in the stomach - previously                            biopsied.                           - Nodule found proximal/superior to the minor                            ampulla. Biopsied.                           - Normal minor papilla otherwise.                           - Fish-mouth noted, pancreatic mucous production                             noted but after release of fluid, normal major                            papilla otherwise.                           EUS Impression:                           - An abnormal region of                            heterogeneity/hypoechogenicity was identified in                            the pancreatic head. Fine needle biopsy performed                            due to previous atypical cells noted on last EUS                            attempt.                           - No malignant-appearing lymph nodes were                            visualized in the celiac region (level 20),                            peripancreatic region and porta hepatis region. Moderate Sedation:      Not Applicable - Patient had care per Anesthesia. Recommendation:           - The patient will be observed post-procedure,                            until all discharge criteria are met.                           -  Discharge patient to home.                           - Patient has a contact number available for                            emergencies. The signs and symptoms of potential                            delayed complications were discussed with the                            patient. Return to normal activities tomorrow.                            Written discharge instructions were provided to the                            patient.                           - Low fat diet.                           - Observe patient's clinical course.                           - Await cytology results.                           - If cytology remains indeterminate, recommend                            repeat Pancreas Protocol CT and likely referral to                            [facility] to see if repeat EUS may be                            needed.                           - The findings and recommendations were discussed                            with the patient.                           - The  findings and recommendations were discussed                            with the patient's family. Procedure Code(s):        --- Professional ---                           [phone], Esophagogastroduodenoscopy, flexible,  transoral; with transendoscopic ultrasound-guided                            intramural or transmural fine needle                            aspiration/biopsy(s), (includes endoscopic                            ultrasound examination limited to the esophagus,                            stomach or duodenum, and adjacent structures) Diagnosis Code(s):        --- Professional ---                           K22.8, Other specified diseases of esophagus                           K31.89, Other diseases of stomach and duodenum                           K86.89, Other specified diseases of pancreas                           I89.9, Noninfective disorder of lymphatic vessels                            and lymph nodes, unspecified                           R93.3, Abnormal findings on diagnostic imaging of                            other parts of digestive tract CPT copyright 2019 [employer]. All rights reserved. The codes documented in this report are preliminary and upon coder review may  be revised to meet current compliance requirements. Justice Britain, MD 09/11/2021 1:27:07 PM This report has been signed electronically. Number of Addenda: 0

## 2021-09-12 ENCOUNTER — Encounter: Payer: Self-pay | Admitting: Counselor

## 2021-09-12 ENCOUNTER — Encounter (HOSPITAL_COMMUNITY): Payer: Self-pay | Admitting: Gastroenterology

## 2021-09-12 LAB — SURGICAL PATHOLOGY

## 2021-09-12 LAB — CANCER ANTIGEN 19-9: CA 19-9: 48 U/mL — ABNORMAL HIGH (ref <34)

## 2021-09-13 ENCOUNTER — Telehealth: Payer: Self-pay | Admitting: Gastroenterology

## 2021-09-13 ENCOUNTER — Encounter: Payer: Self-pay | Admitting: Gastroenterology

## 2021-09-13 LAB — CYTOLOGY - NON PAP

## 2021-09-13 NOTE — Telephone Encounter (Signed)
Left message on machine to call back    [facility] [address] [place] [facility] [address]   Dear Ms. Momon,   I am writing to inform you that the biopsies taken during your recent endoscopic examination showed:     FINAL MICROSCOPIC DIAGNOSIS:  A. 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 The duodenal lesion, that was noted on your report to be superior to the minor papilla returned showing evidence of chronic inflammation and foveolar metaplasia.  No evidence of dysplasia, precancerous changes, were noted.  This is something that can be considered for removal at some point in the future but does not need to be at this time.         Please call us at Dept: [phone] if you have persistent problems or have questions about your condition that have not been fully answered at this time.   Sincerely,   Irving Copas., MD            [facility]                       [place], [id number]                                1

## 2021-09-13 NOTE — Telephone Encounter (Signed)
Inbound call from pt's son requesting a call back stating that he wants to get an update regarding the pt's blood work and also wanted to talk to Dr. Rush Landmark about the procedure the pt recently had. Please advise. Thank you

## 2021-09-14 ENCOUNTER — Ambulatory Visit (HOSPITAL_COMMUNITY): Payer: Medicare HMO | Attending: Family Medicine | Admitting: Physical Therapy

## 2021-09-14 ENCOUNTER — Other Ambulatory Visit: Payer: Self-pay

## 2021-09-14 ENCOUNTER — Encounter (HOSPITAL_COMMUNITY): Payer: Self-pay | Admitting: Physical Therapy

## 2021-09-14 DIAGNOSIS — M545 Low back pain, unspecified: Secondary | ICD-10-CM | POA: Insufficient documentation

## 2021-09-14 DIAGNOSIS — M6281 Muscle weakness (generalized): Secondary | ICD-10-CM | POA: Diagnosis present

## 2021-09-14 DIAGNOSIS — Z9181 History of falling: Secondary | ICD-10-CM | POA: Diagnosis present

## 2021-09-14 DIAGNOSIS — R262 Difficulty in walking, not elsewhere classified: Secondary | ICD-10-CM | POA: Insufficient documentation

## 2021-09-14 NOTE — Patient Instructions (Signed)
Access Code: [email or web address] URL: [email or web address] Date: 09/14/2021 Prepared by: Josue Hector  Exercises Supine Posterior Pelvic Tilt - 2-3 x daily - 7 x weekly - 2 sets - 10 reps Small Range Straight Leg Raise - 2-3 x daily - 7 x weekly - 2 sets - 5 reps Heel Raises with Counter Support - 2-3 x daily - 7 x weekly - 2 sets - 10 reps

## 2021-09-14 NOTE — Therapy (Signed)
[place], [place], [address] Phone: [phone]   Fax:  [phone]  Physical Therapy Evaluation  Patient Details  Name: Katherine Middleton MRN: [id number] Date of Birth: 02/13/1951 Referring Provider (PT): Bing Matter Pa-C   Encounter Date: 09/14/2021   PT End of Session - 09/14/21 1606     Visit Number 1    Number of Visits 12    Date for PT Re-Evaluation 10/26/21    Authorization Type [employer]    Authorization Time Period Check auth    PT Start Time [id number]    PT Stop Time 1605    PT Time Calculation (min) 50 min    Equipment Utilized During Treatment Gait belt    Activity Tolerance Patient tolerated treatment well;Patient limited by fatigue    Behavior During Therapy [facility] for tasks assessed/performed             Past Medical History:  Diagnosis Date   Anxiety    Arthritis    Bipolar disorder ([place])    Depression    Fibromyalgia    GERD (gastroesophageal reflux disease) 12/31/2002   HLD (hyperlipidemia)    diet controlled - no meds   HSV infection    Liver mass 07/2021   Osteoarthritis    Prolonged Q-T interval on ECG 02/2021   Sinusitis 12/31/2012   Wears glasses     Past Surgical History:  Procedure Laterality Date   BIOPSY  08/24/2021   Procedure: BIOPSY;  Surgeon: Irving Copas., MD;  Location: [place];  Service: Gastroenterology;;   BIOPSY  09/11/2021   Procedure: BIOPSY;  Surgeon: Irving Copas., MD;  [facility];  Service: Gastroenterology;;   ESOPHAGOGASTRODUODENOSCOPY (EGD) WITH PROPOFOL N/A 08/11/2021   Procedure: ESOPHAGOGASTRODUODENOSCOPY (EGD) WITH PROPOFOL;  Surgeon: Carol Ada, MD;  [facility];  Service: Endoscopy;  Laterality: N/A;   ESOPHAGOGASTRODUODENOSCOPY (EGD) WITH PROPOFOL N/A 08/24/2021   Procedure: ESOPHAGOGASTRODUODENOSCOPY (EGD) WITH PROPOFOL;  Surgeon: Rush Landmark Telford Nab., MD;  Location: [place];   Service: Gastroenterology;  Laterality: N/A;   ESOPHAGOGASTRODUODENOSCOPY (EGD) WITH PROPOFOL N/A 09/11/2021   Procedure: ESOPHAGOGASTRODUODENOSCOPY (EGD) WITH PROPOFOL;  Surgeon: Rush Landmark Telford Nab., MD;  [facility];  Service: Gastroenterology;  Laterality: N/A;   EUS N/A 08/24/2021   Procedure: UPPER ENDOSCOPIC ULTRASOUND (EUS) LINEAR;  Surgeon: Irving Copas., MD;  Location: [place];  Service: Gastroenterology;  Laterality: N/A;   EUS N/A 09/11/2021   Procedure: UPPER ENDOSCOPIC ULTRASOUND (EUS) LINEAR;  Surgeon: Irving Copas., MD;  [facility];  Service: Gastroenterology;  Laterality: N/A;   FINE NEEDLE ASPIRATION  08/24/2021   Procedure: FINE NEEDLE ASPIRATION;  Surgeon: Rush Landmark Telford Nab., MD;  Location: [place];  Service: Gastroenterology;;   FINE NEEDLE ASPIRATION  09/11/2021   Procedure: FINE NEEDLE ASPIRATION (FNA) LINEAR;  Surgeon: Irving Copas., MD;  [facility];  Service: Gastroenterology;;  Delaine Lame. not in system to charge   TONSILLECTOMY  04/08/57   some date around then   TUBAL LIGATION  10/21/1984   UPPER ESOPHAGEAL ENDOSCOPIC ULTRASOUND (EUS) N/A 08/11/2021   Procedure: UPPER ESOPHAGEAL ENDOSCOPIC ULTRASOUND (EUS);  Surgeon: Carol Ada, MD;  [facility];  Service: Endoscopy;  Laterality: N/A;    There were no vitals filed for this visit.    Subjective Assessment - 09/14/21 1527     Subjective Patient presents to therapy with complaint of low back and knee pain. She has been dealing with some other health  issues including her stomach and currently undergoing screening for pancreatic cancer. She has some imaging scheduled for clarification on these matters. She is previously known to this clinic. She was seen for balance and falls and had good results. She reports she has regressed and is having more trouble with back pain and leg weakness. She does not that she "stumbles" at times.  Denies recent falls since last therapy episode. Reports history of fibro, osteoporosis, arthritis.    Pertinent History fibromyalgia, bipolar, arthritis, osteoporosis    Limitations Walking;House hold activities;Standing;Lifting    How long can you walk comfortably? <10 minutes    Patient Stated Goals improve strength    Currently in Pain? Yes    Pain Score 5     Pain Location Back    Pain Orientation Posterior    Pain Descriptors / Indicators Aching;Dull    Pain Type Chronic pain    Pain Onset More than a month ago    Pain Frequency Constant    Aggravating Factors  bending    Pain Relieving Factors Tylenol, heating pad    Effect of Pain on Daily Activities Limit                [facility] PT Assessment - 09/14/21 0001       Assessment   Medical Diagnosis LBP/ LE weakness    Referring Provider (PT) Bing Matter Pa-C    Onset Date/Surgical Date --   Chronic   Prior Therapy Yes      Precautions   Precautions Fall      Restrictions   Weight Bearing Restrictions No      Balance Screen   Has the patient fallen in the past 6 months No      [place] residence    Living Arrangements Alone      Prior Function   Level of Independence Independent      Cognition   Overall Cognitive Status Within Functional Limits for tasks assessed      Observation/Other Assessments   Focus on Therapeutic Outcomes (FOTO)  32% function      Strength   Right Hip Flexion 4/5    Right Hip Extension 4/5    Right Hip ABduction 4/5    Left Hip Flexion 4/5    Left Hip Extension 4-/5    Left Hip ABduction 3+/5    Right Knee Flexion 4/5    Right Knee Extension 4/5    Left Knee Flexion 4/5    Left Knee Extension 4/5    Right Ankle Dorsiflexion 4/5    Left Ankle Dorsiflexion 4/5      Ambulation/Gait   Ambulation Distance (Feet) 150 Feet    Assistive device Small based quad cane    Gait Pattern Decreased step length - right;Decreased step length -  left;Decreased stride length    Ambulation Surface Level;Indoor    Gait Comments 2MWT                        Objective measurements completed on examination: See above findings.       [facility] PT Treatment/Exercise - 09/14/21 0001       Exercises   Exercises Lumbar      Lumbar Exercises: Supine   Pelvic Tilt 5 reps;5 seconds      Knee/Hip Exercises: Standing   Heel Raises 1 set;10 reps      Knee/Hip Exercises: Supine   Straight Leg Raises Both;1  set;5 reps                     PT Education - 09/14/21 1530     Education Details on evaluation findings, POC and HEP    Person(s) Educated Patient    Methods Explanation;Handout    Comprehension Verbalized understanding              PT Short Term Goals - 09/14/21 1633       PT SHORT TERM GOAL #1   Title Patient will be independent with initial HEP and self-management strategies to improve functional outcomes    Time 3    Period Weeks    Status New    Target Date 10/05/21               PT Long Term Goals - 09/14/21 1634       PT LONG TERM GOAL #1   Title Patient will be independent with advance HEP and self-management strategies to improve functional outcomes    Time 6    Period Weeks    Status New    Target Date 10/26/21      PT LONG TERM GOAL #2   Title Patient will improve FOTO score to predicted value to indicate improvement in functional outcomes    Time 6    Period Weeks    Status New    Target Date 10/26/21      PT LONG TERM GOAL #3   Title Patient will be able to ambulate at least 275 feet during 2MWT with LRAD to demonstrate improved ability to perform functional mobility and associated tasks.    Time 6    Period Weeks    Status New    Target Date 10/26/21      PT LONG TERM GOAL #4   Title Patient will have equal to or > 4+/5 MMT throughout BLE to improve ability to perform functional mobility, stair ambulation and ADLs.    Time 6    Period Weeks     Status New    Target Date 10/26/21                    Plan - 09/14/21 1606     Clinical Impression Statement Patient is a 70 y.o. female who presents to physical therapy with complaint of LBP and LE weakness. Patient demonstrates decreased strength, reduced activity tolerance, balance deficits and gait abnormalities which are likely contributing to symptoms of pain and are negatively impacting patient ability to perform ADLs and functional mobility tasks. Patient will benefit from skilled physical therapy services to address these deficits to reduce pain, improve level of function with ADLs, functional mobility tasks, and reduce risk for falls.    Personal Factors and Comorbidities Past/Current Experience;Comorbidity 3+    Comorbidities fibromyalgia, frequent falls, osteoporosis, arthritis    Examination-Activity Limitations Locomotion Level;Bed Mobility;Bend;Transfers;Lift;Bathing;Stand;Stairs;Squat;Dressing    Examination-Participation Restrictions Cleaning;Laundry;Shop;Community Activity;Yard Work    Stability/Clinical Decision Making Stable/Uncomplicated    Designer, jewellery Low    Rehab Potential Good    PT Frequency 2x / week    PT Duration 6 weeks    PT Treatment/Interventions Patient/family education;Gait training;Stair training;Functional mobility training;Therapeutic activities;Therapeutic exercise;Balance training;Manual techniques;ADLs/Self Care Home Management;Biofeedback;Cryotherapy;Fluidtherapy;Parrafin;Ultrasound;Traction;Moist Heat;Iontophoresis '4mg'$ /ml Dexamethasone;DME Instruction;Vestibular;Visual/perceptual remediation/compensation;Passive range of motion;Dry needling;Neuromuscular re-education;Energy conservation;Spinal Manipulations;Manual lymph drainage;Splinting;Joint Manipulations;Orthotic Fit/Training;Compression bandaging;Taping;Vasopneumatic Device;Electrical Stimulation;Contrast Bath;Canalith Repostioning    PT Next Visit Plan Review goals and HEP.  Progress core and glute strengthening as able. Balance and gait  when ready. Try bridge, bent knee raise, supine clam, SKTC, LTR.    PT Home Exercise Plan Eval: SLR, pelvic tilt, heel raise    Consulted and Agree with Plan of Care Patient             Patient will benefit from skilled therapeutic intervention in order to improve the following deficits and impairments:  Pain, Abnormal gait, Decreased activity tolerance, Decreased balance, Decreased strength, Difficulty walking, Improper body mechanics, Decreased mobility, Decreased endurance, Decreased range of motion, Hypomobility, Impaired flexibility  Visit Diagnosis: Low back pain, unspecified back pain laterality, unspecified chronicity, unspecified whether sciatica present  Muscle weakness (generalized)  Difficulty in walking, not elsewhere classified     Problem List Patient Active Problem List   Diagnosis Date Noted   Back pain, chronic 04/03/2013   Anxiety 12/17/2012   Vitamin D insufficiency 12/17/2012   GERD (gastroesophageal reflux disease) 11/18/2012   Bipolar 1 disorder ([place]) 11/18/2012   Fibromyalgia 11/18/2012   4:45 PM, 09/14/21 Josue Hector PT DPT  Physical Therapist with [facility]  [phone] [place] [address] [place], [place], [address] Phone: [phone]   Fax:  [phone]  Name: Katherine Middleton MRN: [id number] Date of Birth: [birth year]

## 2021-09-15 ENCOUNTER — Other Ambulatory Visit: Payer: Self-pay

## 2021-09-15 DIAGNOSIS — K869 Disease of pancreas, unspecified: Secondary | ICD-10-CM

## 2021-09-18 ENCOUNTER — Ambulatory Visit (HOSPITAL_COMMUNITY): Payer: Medicare HMO | Admitting: Physical Therapy

## 2021-09-18 ENCOUNTER — Other Ambulatory Visit: Payer: Self-pay

## 2021-09-18 DIAGNOSIS — M6281 Muscle weakness (generalized): Secondary | ICD-10-CM

## 2021-09-18 DIAGNOSIS — R262 Difficulty in walking, not elsewhere classified: Secondary | ICD-10-CM

## 2021-09-18 DIAGNOSIS — M545 Low back pain, unspecified: Secondary | ICD-10-CM | POA: Diagnosis not present

## 2021-09-18 NOTE — Therapy (Signed)
[facility] [address] [place], [place], [address] Phone: [phone]   Fax:  [phone]  Physical Therapy Treatment  Patient Details  Name: Katherine Middleton MRN: [id number] Date of Birth: [birth year] Referring Provider (PT): Bing Matter Pa-C   Encounter Date: 09/18/2021   PT End of Session - 09/18/21 1329     Visit Number 2    Number of Visits 12    Date for PT Re-Evaluation 10/26/21    Authorization Type [employer] approved 12 visits 9/15-10/27    Authorization Time Period Check auth    Authorization - Visit Number 2    Authorization - Number of Visits 12    Progress Note Due on Visit 18    PT Start Time 1320    PT Stop Time 1358    PT Time Calculation (min) 38 min    Equipment Utilized During Treatment Gait belt    Activity Tolerance Patient tolerated treatment well;Patient limited by fatigue    Behavior During Therapy [facility] for tasks assessed/performed             Past Medical History:  Diagnosis Date   Anxiety    Arthritis    Bipolar disorder ([place])    Depression    Fibromyalgia    GERD (gastroesophageal reflux disease) 12/31/2002   HLD (hyperlipidemia)    diet controlled - no meds   HSV infection    Liver mass 07/2021   Osteoarthritis    Prolonged Q-T interval on ECG 02/2021   Sinusitis 12/31/2012   Wears glasses     Past Surgical History:  Procedure Laterality Date   BIOPSY  08/24/2021   Procedure: BIOPSY;  Surgeon: Irving Copas., MD;  Location: [place];  Service: Gastroenterology;;   BIOPSY  09/11/2021   Procedure: BIOPSY;  Surgeon: Irving Copas., MD;  [facility];  Service: Gastroenterology;;   ESOPHAGOGASTRODUODENOSCOPY (EGD) WITH PROPOFOL N/A 08/11/2021   Procedure: ESOPHAGOGASTRODUODENOSCOPY (EGD) WITH PROPOFOL;  Surgeon: Carol Ada, MD;  [facility];  Service: Endoscopy;  Laterality: N/A;   ESOPHAGOGASTRODUODENOSCOPY (EGD) WITH PROPOFOL N/A 08/24/2021    Procedure: ESOPHAGOGASTRODUODENOSCOPY (EGD) WITH PROPOFOL;  Surgeon: Rush Landmark Telford Nab., MD;  Location: [place];  Service: Gastroenterology;  Laterality: N/A;   ESOPHAGOGASTRODUODENOSCOPY (EGD) WITH PROPOFOL N/A 09/11/2021   Procedure: ESOPHAGOGASTRODUODENOSCOPY (EGD) WITH PROPOFOL;  Surgeon: Rush Landmark Telford Nab., MD;  [facility];  Service: Gastroenterology;  Laterality: N/A;   EUS N/A 08/24/2021   Procedure: UPPER ENDOSCOPIC ULTRASOUND (EUS) LINEAR;  Surgeon: Irving Copas., MD;  Location: [place];  Service: Gastroenterology;  Laterality: N/A;   EUS N/A 09/11/2021   Procedure: UPPER ENDOSCOPIC ULTRASOUND (EUS) LINEAR;  Surgeon: Irving Copas., MD;  [facility];  Service: Gastroenterology;  Laterality: N/A;   FINE NEEDLE ASPIRATION  08/24/2021   Procedure: FINE NEEDLE ASPIRATION;  Surgeon: Rush Landmark Telford Nab., MD;  Location: [place];  Service: Gastroenterology;;   FINE NEEDLE ASPIRATION  09/11/2021   Procedure: FINE NEEDLE ASPIRATION (FNA) LINEAR;  Surgeon: Irving Copas., MD;  [facility];  Service: Gastroenterology;;  Delaine Lame. not in system to charge   TONSILLECTOMY  04/08/57   some date around then   TUBAL LIGATION  10/21/1984   UPPER ESOPHAGEAL ENDOSCOPIC ULTRASOUND (EUS) N/A 08/11/2021   Procedure: UPPER ESOPHAGEAL ENDOSCOPIC ULTRASOUND (EUS);  Surgeon: Carol Ada, MD;  Location[facility];  Service: Endoscopy;  Laterality: N/A;    There were no vitals filed for this visit.   Subjective Assessment -  09/18/21 1327     Subjective Pt states all her pain is in her lower back 5/10.  Reports complaince with HEP.  States she is to get a CT with contrast soon to look at her pancreas.    Currently in Pain? Yes    Pain Score 5     Pain Location Back    Pain Orientation Posterior    Pain Descriptors / Indicators Aching;Dull                               [facility] Adult PT  Treatment/Exercise - 09/18/21 0001       Lumbar Exercises: Stretches   Single Knee to Chest Stretch Right;Left;5 reps;10 seconds    Lower Trunk Rotation 5 reps;10 seconds      Lumbar Exercises: Seated   Long Arc Quad on Chair Both;10 reps      Lumbar Exercises: Supine   Pelvic Tilt 10 reps;5 seconds    Clam 10 reps    Bent Knee Raise 10 reps    Bent Knee Raise Limitations with core stabilization    Bridge 10 reps    Straight Leg Raise 10 reps                     PT Education - 09/18/21 1401     Education Details reviewed goals and POC moving forward.  Discussed completing HEP and importance of maintaining complainces.  Encouraged to return to silver sneakers at [facility] as well.    Person(s) Educated Patient    Methods Explanation    Comprehension Verbalized understanding              PT Short Term Goals - 09/18/21 1358       PT SHORT TERM GOAL #1   Title Patient will be independent with initial HEP and self-management strategies to improve functional outcomes    Time 3    Period Weeks    Status On-going    Target Date 10/05/21               PT Long Term Goals - 09/18/21 1359       PT LONG TERM GOAL #1   Title Patient will be independent with advance HEP and self-management strategies to improve functional outcomes    Time 6    Period Weeks    Status On-going      PT LONG TERM GOAL #2   Title Patient will improve FOTO score to predicted value to indicate improvement in functional outcomes    Time 6    Period Weeks    Status On-going      PT LONG TERM GOAL #3   Title Patient will be able to ambulate at least 275 feet during 2MWT with LRAD to demonstrate improved ability to perform functional mobility and associated tasks.    Time 6    Period Weeks    Status On-going      PT LONG TERM GOAL #4   Title Patient will have equal to or > 4+/5 MMT throughout BLE to improve ability to perform functional mobility, stair ambulation and ADLs.    Time  6    Period Weeks    Status On-going                   Plan - 09/18/21 1357     Clinical Impression Statement Reviewed goals and POC moving forward.  Pt able  to demonstrate HEP correctly with minimal cues needed.  Added glute and core strengthening exercises to POC as well as LE stretches for tight musculature.    PT reported shakiness in her LE's after completing supine therex this session.  No pain or any other issues.  Pt will continue to benefit from skilled PT as she has noted weakness.    Personal Factors and Comorbidities Past/Current Experience;Comorbidity 3+    Comorbidities fibromyalgia, frequent falls, osteoporosis, arthritis    Examination-Activity Limitations Locomotion Level;Bed Mobility;Bend;Transfers;Lift;Bathing;Stand;Stairs;Squat;Dressing    Examination-Participation Restrictions Cleaning;Laundry;Shop;Community Activity;Yard Work    Stability/Clinical Decision Making Stable/Uncomplicated    Rehab Potential Good    PT Frequency 2x / week    PT Duration 6 weeks    PT Treatment/Interventions Patient/family education;Gait training;Stair training;Functional mobility training;Therapeutic activities;Therapeutic exercise;Balance training;Manual techniques;ADLs/Self Care Home Management;Biofeedback;Cryotherapy;Fluidtherapy;Parrafin;Ultrasound;Traction;Moist Heat;Iontophoresis 4mg /ml Dexamethasone;DME Instruction;Vestibular;Visual/perceptual remediation/compensation;Passive range of motion;Dry needling;Neuromuscular re-education;Energy conservation;Spinal Manipulations;Manual lymph drainage;Splinting;Joint Manipulations;Orthotic Fit/Training;Compression bandaging;Taping;Vasopneumatic Device;Electrical Stimulation;Contrast Bath;Canalith Repostioning    PT Next Visit Plan Progress core and glute strengthening as able. Balance and gait when ready. Next session begin standing heelraises, hip strengthening and sit to stands.  Update HEP    PT Home Exercise Plan Eval: SLR, pelvic tilt,  heel raise    Consulted and Agree with Plan of Care Patient             Patient will benefit from skilled therapeutic intervention in order to improve the following deficits and impairments:  Pain, Abnormal gait, Decreased activity tolerance, Decreased balance, Decreased strength, Difficulty walking, Improper body mechanics, Decreased mobility, Decreased endurance, Decreased range of motion, Hypomobility, Impaired flexibility  Visit Diagnosis: Low back pain, unspecified back pain laterality, unspecified chronicity, unspecified whether sciatica present  Muscle weakness (generalized)  Difficulty in walking, not elsewhere classified     Problem List Patient Active Problem List   Diagnosis Date Noted   Back pain, chronic 04/03/2013   Anxiety 12/17/2012   Vitamin D insufficiency 12/17/2012   GERD (gastroesophageal reflux disease) 11/18/2012   Bipolar 1 disorder ([place]) 11/18/2012   Fibromyalgia 11/18/2012   Teena Irani, PTA/CLT [phone]  Teena Irani, PTA 09/18/2021, 2:03 PM  [facility] [address] [place], [place], [address] Phone: [phone]   Fax:  [phone]  Name: Katherine Middleton MRN: [id number] Date of Birth: 10-Jun-1951

## 2021-09-19 ENCOUNTER — Telehealth: Payer: Self-pay | Admitting: Gastroenterology

## 2021-09-19 NOTE — Telephone Encounter (Signed)
Returned son's call and advised to call [facility] at [phone] to follow up on the status of your appointment.

## 2021-09-19 NOTE — Telephone Encounter (Signed)
Patients son calling to follow up on a CT that needs to be scheduled.

## 2021-09-20 NOTE — Telephone Encounter (Signed)
I spoke with the pt daughter n law and she states that all questions were answered previously. She will call back with any further concerns

## 2021-09-21 ENCOUNTER — Other Ambulatory Visit: Payer: Self-pay

## 2021-09-21 ENCOUNTER — Ambulatory Visit (HOSPITAL_COMMUNITY)
Admission: RE | Admit: 2021-09-21 | Discharge: 2021-09-21 | Disposition: A | Discharge status: Home / Self Care | Payer: Medicare HMO | Source: Ambulatory Visit | Attending: Gastroenterology | Admitting: Gastroenterology

## 2021-09-21 DIAGNOSIS — K869 Disease of pancreas, unspecified: Secondary | ICD-10-CM | POA: Diagnosis present

## 2021-09-21 MED ORDER — IOHEXOL 350 MG/ML SOLN
75.0000 mL | Freq: Once | INTRAVENOUS | Status: AC | PRN
  Administered 2021-09-21: 75 mL via INTRAVENOUS

## 2021-09-22 ENCOUNTER — Encounter (HOSPITAL_COMMUNITY): Payer: Medicare HMO

## 2021-09-22 ENCOUNTER — Telehealth (HOSPITAL_COMMUNITY): Payer: Self-pay

## 2021-09-22 NOTE — Telephone Encounter (Signed)
No show, called and left message concerning missed apt today.  Reminded next apt date and time with contact number included if needs to cancel/reschedule future apts.  Ihor Austin, LPTA/CLT; Delana Meyer [phone]

## 2021-09-25 ENCOUNTER — Ambulatory Visit (HOSPITAL_COMMUNITY): Payer: Medicare HMO

## 2021-09-27 ENCOUNTER — Encounter (HOSPITAL_COMMUNITY): Payer: Self-pay | Admitting: Physical Therapy

## 2021-09-27 ENCOUNTER — Ambulatory Visit (HOSPITAL_COMMUNITY): Payer: Medicare HMO | Admitting: Physical Therapy

## 2021-09-27 ENCOUNTER — Other Ambulatory Visit: Payer: Self-pay

## 2021-09-27 DIAGNOSIS — M545 Low back pain, unspecified: Secondary | ICD-10-CM

## 2021-09-27 DIAGNOSIS — Z9181 History of falling: Secondary | ICD-10-CM

## 2021-09-27 DIAGNOSIS — R262 Difficulty in walking, not elsewhere classified: Secondary | ICD-10-CM

## 2021-09-27 DIAGNOSIS — M6281 Muscle weakness (generalized): Secondary | ICD-10-CM

## 2021-09-27 NOTE — Patient Instructions (Signed)
Access Code: [email or web address] URL: [email or web address] Date: 09/27/2021 Prepared by: Mitzi Hansen Priyanka Causey  Exercises Standing Hip Abduction with Counter Support - 1 x daily - 7 x weekly - 2 sets - 10 reps

## 2021-09-27 NOTE — Therapy (Signed)
[facility] [address] [place], [place], [address] Phone: [phone]   Fax:  [phone]  Physical Therapy Treatment  Patient Details  Name: Katherine Middleton MRN: [id number] Date of Birth: 01/17/1951 Referring Provider (PT): Bing Matter Pa-C   Encounter Date: 09/27/2021   PT End of Session - 09/27/21 1307     Visit Number 3    Number of Visits 12    Date for PT Re-Evaluation 10/26/21    Authorization Type [employer]Here approved 12 visits 9/15-10/27    Authorization - Visit Number 3    Authorization - Number of Visits 12    Progress Note Due on Visit 10    PT Start Time 1310    PT Stop Time 1348    PT Time Calculation (min) 38 min    Equipment Utilized During Treatment Gait belt    Activity Tolerance Patient tolerated treatment well;Patient limited by fatigue    Behavior During Therapy [facility] for tasks assessed/performed             Past Medical History:  Diagnosis Date   Anxiety    Arthritis    Bipolar disorder ([place])    Depression    Fibromyalgia    GERD (gastroesophageal reflux disease) 12/31/2002   HLD (hyperlipidemia)    diet controlled - no meds   HSV infection    Liver mass 07/2021   Osteoarthritis    Prolonged Q-T interval on ECG 02/2021   Sinusitis 12/31/2012   Wears glasses     Past Surgical History:  Procedure Laterality Date   BIOPSY  08/24/2021   Procedure: BIOPSY;  Surgeon: Irving Copas., MD;  Location: [place];  Service: Gastroenterology;;   BIOPSY  09/11/2021   Procedure: BIOPSY;  Surgeon: Irving Copas., MD;  [facility]: [facility];  Service: Gastroenterology;;   ESOPHAGOGASTRODUODENOSCOPY (EGD) WITH PROPOFOL N/A 08/11/2021   Procedure: ESOPHAGOGASTRODUODENOSCOPY (EGD) WITH PROPOFOL;  Surgeon: Carol Ada, MD;  [facility]: [facility];  Service: Endoscopy;  Laterality: N/A;   ESOPHAGOGASTRODUODENOSCOPY (EGD) WITH PROPOFOL N/A 08/24/2021   Procedure: ESOPHAGOGASTRODUODENOSCOPY  (EGD) WITH PROPOFOL;  Surgeon: Rush Landmark Telford Nab., MD;  Location: [facility];  Service: Gastroenterology;  Laterality: N/A;   ESOPHAGOGASTRODUODENOSCOPY (EGD) WITH PROPOFOL N/A 09/11/2021   Procedure: ESOPHAGOGASTRODUODENOSCOPY (EGD) WITH PROPOFOL;  Surgeon: Rush Landmark Telford Nab., MD;  [facility];  Service: Gastroenterology;  Laterality: N/A;   EUS N/A 08/24/2021   Procedure: UPPER ENDOSCOPIC ULTRASOUND (EUS) LINEAR;  Surgeon: Irving Copas., MD;  Location: [facility];  Service: Gastroenterology;  Laterality: N/A;   EUS N/A 09/11/2021   Procedure: UPPER ENDOSCOPIC ULTRASOUND (EUS) LINEAR;  Surgeon: Irving Copas., MD;  [facility];  Service: Gastroenterology;  Laterality: N/A;   FINE NEEDLE ASPIRATION  08/24/2021   Procedure: FINE NEEDLE ASPIRATION;  Surgeon: Rush Landmark Telford Nab., MD;  Location: [facility];  Service: Gastroenterology;;   FINE NEEDLE ASPIRATION  09/11/2021   Procedure: FINE NEEDLE ASPIRATION (FNA) LINEAR;  Surgeon: Irving Copas., MD;  [facility];  Service: Gastroenterology;;  Delaine Lame. not in system to charge   TONSILLECTOMY  04/08/57   some date around then   TUBAL LIGATION  10/21/1984   UPPER ESOPHAGEAL ENDOSCOPIC ULTRASOUND (EUS) N/A 08/11/2021   Procedure: UPPER ESOPHAGEAL ENDOSCOPIC ULTRASOUND (EUS);  Surgeon: Carol Ada, MD;  Location: [facility];  Service: Endoscopy;  Laterality: N/A;    There were no vitals filed for this visit.   Subjective Assessment - 09/27/21 1308     Subjective  She states she fell last Friday. She missed her appointment and her family came to help and got her up. She hasnt been able to do her exercises at home because of the fall.    Currently in Pain? Yes    Pain Score 5     Pain Location Back    Pain Orientation Lower    Pain Descriptors / Indicators Aching    Pain Type Chronic pain                               [facility] Adult PT  Treatment/Exercise - 09/27/21 0001       Lumbar Exercises: Stretches   Single Knee to Chest Stretch Right;Left;5 reps;10 seconds    Lower Trunk Rotation 5 reps;10 seconds      Lumbar Exercises: Standing   Heel Raises 10 reps    Other Standing Lumbar Exercises hip abduction 1x 10 bilateral      Lumbar Exercises: Supine   Pelvic Tilt 10 reps;5 seconds    Bent Knee Raise 10 reps    Bent Knee Raise Limitations with core stabilization    Bridge 10 reps    Bridge Limitations 2 sets    Straight Leg Raise 10 reps                     PT Education - 09/27/21 1308     Education Details HEP    Person(s) Educated Patient    Methods Explanation    Comprehension Verbalized understanding              PT Short Term Goals - 09/18/21 1358       PT SHORT TERM GOAL #1   Title Patient will be independent with initial HEP and self-management strategies to improve functional outcomes    Time 3    Period Weeks    Status On-going    Target Date 10/05/21               PT Long Term Goals - 09/18/21 1359       PT LONG TERM GOAL #1   Title Patient will be independent with advance HEP and self-management strategies to improve functional outcomes    Time 6    Period Weeks    Status On-going      PT LONG TERM GOAL #2   Title Patient will improve FOTO score to predicted value to indicate improvement in functional outcomes    Time 6    Period Weeks    Status On-going      PT LONG TERM GOAL #3   Title Patient will be able to ambulate at least 275 feet during 2MWT with LRAD to demonstrate improved ability to perform functional mobility and associated tasks.    Time 6    Period Weeks    Status On-going      PT LONG TERM GOAL #4   Title Patient will have equal to or > 4+/5 MMT throughout BLE to improve ability to perform functional mobility, stair ambulation and ADLs.    Time 6    Period Weeks    Status On-going                   Plan - 09/27/21 1308      Clinical Impression Statement Continued with table exercises for mobility, core and hip strengthening. Patient requires intermittent cueing for mechanics with good carry over. Patient fatigues  quickly with exercises and requires intermittent rest breaks for fatigue. Patient with increased fatigue and unsteadiness at end of session and is assisted to her car. Patient will continue to benefit from skilled physical therapy in order to reduce impairment and improve function.    Personal Factors and Comorbidities Past/Current Experience;Comorbidity 3+    Comorbidities fibromyalgia, frequent falls, osteoporosis, arthritis    Examination-Activity Limitations Locomotion Level;Bed Mobility;Bend;Transfers;Lift;Bathing;Stand;Stairs;Squat;Dressing    Examination-Participation Restrictions Cleaning;Laundry;Shop;Community Activity;Yard Work    Stability/Clinical Decision Making Stable/Uncomplicated    Rehab Potential Good    PT Frequency 2x / week    PT Duration 6 weeks    PT Treatment/Interventions Patient/family education;Gait training;Stair training;Functional mobility training;Therapeutic activities;Therapeutic exercise;Balance training;Manual techniques;ADLs/Self Care Home Management;Biofeedback;Cryotherapy;Fluidtherapy;Parrafin;Ultrasound;Traction;Moist Heat;Iontophoresis 4mg /ml Dexamethasone;DME Instruction;Vestibular;Visual/perceptual remediation/compensation;Passive range of motion;Dry needling;Neuromuscular re-education;Energy conservation;Spinal Manipulations;Manual lymph drainage;Splinting;Joint Manipulations;Orthotic Fit/Training;Compression bandaging;Taping;Vasopneumatic Device;Electrical Stimulation;Contrast Bath;Canalith Repostioning    PT Next Visit Plan Progress core and glute strengthening as able. Balance and gait when ready. Next session begin standing heelraises, hip strengthening and sit to stands.  Update HEP    PT Home Exercise Plan Eval: SLR, pelvic tilt, heel raise 9/28 standing hip abd     Consulted and Agree with Plan of Care Patient             Patient will benefit from skilled therapeutic intervention in order to improve the following deficits and impairments:  Pain, Abnormal gait, Decreased activity tolerance, Decreased balance, Decreased strength, Difficulty walking, Improper body mechanics, Decreased mobility, Decreased endurance, Decreased range of motion, Hypomobility, Impaired flexibility  Visit Diagnosis: Low back pain, unspecified back pain laterality, unspecified chronicity, unspecified whether sciatica present  Muscle weakness (generalized)  Difficulty in walking, not elsewhere classified  History of falling     Problem List Patient Active Problem List   Diagnosis Date Noted   Back pain, chronic 04/03/2013   Anxiety 12/17/2012   Vitamin D insufficiency 12/17/2012   GERD (gastroesophageal reflux disease) 11/18/2012   Bipolar 1 disorder ([place]) 11/18/2012   Fibromyalgia 11/18/2012    1:53 PM, 09/27/21 Mearl Latin PT, DPT Physical Therapist at [facility] [address] [place], [place], [address] Phone: [phone]   Fax:  [phone]  Name: Katherine Middleton MRN: [id number] Date of Birth: [birth year]

## 2021-09-28 ENCOUNTER — Telehealth: Payer: Self-pay | Admitting: Gastroenterology

## 2021-09-28 NOTE — Telephone Encounter (Signed)
The pt's son has been advised that Dr Benson Norway will be taking care of the referrals.  (See CT scan results note).  The pt thanked me for calling and will touch base with Dr Ulyses Amor office

## 2021-09-28 NOTE — Telephone Encounter (Signed)
Inbound call from patient's son requesting a call from a nurse please in regards to a referral that we are suppose to do for patient.

## 2021-10-03 ENCOUNTER — Other Ambulatory Visit: Payer: Self-pay

## 2021-10-03 ENCOUNTER — Ambulatory Visit (HOSPITAL_COMMUNITY): Payer: Medicare HMO | Attending: Family Medicine

## 2021-10-03 DIAGNOSIS — Z9181 History of falling: Secondary | ICD-10-CM | POA: Diagnosis present

## 2021-10-03 DIAGNOSIS — M6281 Muscle weakness (generalized): Secondary | ICD-10-CM | POA: Insufficient documentation

## 2021-10-03 DIAGNOSIS — M545 Low back pain, unspecified: Secondary | ICD-10-CM | POA: Diagnosis present

## 2021-10-03 DIAGNOSIS — R262 Difficulty in walking, not elsewhere classified: Secondary | ICD-10-CM | POA: Insufficient documentation

## 2021-10-03 NOTE — Patient Instructions (Signed)
Access Code: [email or web address] URL: [email or web address] Date: 10/03/2021 Prepared by: Sherlyn Lees  Exercises Bilateral Bent Leg Lift - 1 x daily - 7 x weekly - 3 sets - 10 reps Supine Bridge - 1 x daily - 7 x weekly - 3 sets - 10 reps Supine Active Straight Leg Raise - 1 x daily - 7 x weekly - 3 sets - 10 reps Clamshell - 1 x daily - 7 x weekly - 3 sets - 10 reps Hooklying Single Knee to Chest - 1 x daily - 7 x weekly - 3 sets - 3 reps - 30 sec hold Supine Double Knee to Chest - 1 x daily - 7 x weekly - 3 sets - 3 reps - 30 sec hold

## 2021-10-03 NOTE — Therapy (Signed)
[facility] [address] [place], [place], [address] Phone: [phone]   Fax:  [phone]  Physical Therapy Treatment  Patient Details  Name: Katherine Middleton MRN: [id number] Date of Birth: [birth year] Referring Provider (PT): Bing Matter Pa-C   Encounter Date: 10/03/2021   PT End of Session - 10/03/21 1551     Visit Number 4    Number of Visits 12    Date for PT Re-Evaluation 10/26/21    Authorization Type [employer]Here approved 12 visits 9/15-10/27    Authorization - Visit Number 4    Authorization - Number of Visits 12    Progress Note Due on Visit 10    PT Start Time 1600    PT Stop Time 1645    PT Time Calculation (min) 45 min    Equipment Utilized During Treatment Gait belt    Activity Tolerance Patient tolerated treatment well;Patient limited by fatigue    Behavior During Therapy [facility] for tasks assessed/performed             Past Medical History:  Diagnosis Date   Anxiety    Arthritis    Bipolar disorder ([place])    Depression    Fibromyalgia    GERD (gastroesophageal reflux disease) 12/31/2002   HLD (hyperlipidemia)    diet controlled - no meds   HSV infection    Liver mass 07/2021   Osteoarthritis    Prolonged Q-T interval on ECG 02/2021   Sinusitis 12/31/2012   Wears glasses     Past Surgical History:  Procedure Laterality Date   BIOPSY  08/24/2021   Procedure: BIOPSY;  Surgeon: Irving Copas., MD;  Location: [facility];  Service: Gastroenterology;;   BIOPSY  09/11/2021   Procedure: BIOPSY;  Surgeon: Irving Copas., MD;  [facility];  Service: Gastroenterology;;   ESOPHAGOGASTRODUODENOSCOPY (EGD) WITH PROPOFOL N/A 08/11/2021   Procedure: ESOPHAGOGASTRODUODENOSCOPY (EGD) WITH PROPOFOL;  Surgeon: Carol Ada, MD;  [facility];  Service: Endoscopy;  Laterality: N/A;   ESOPHAGOGASTRODUODENOSCOPY (EGD) WITH PROPOFOL N/A 08/24/2021   Procedure: ESOPHAGOGASTRODUODENOSCOPY  (EGD) WITH PROPOFOL;  Surgeon: Rush Landmark Telford Nab., MD;  Location: [facility];  Service: Gastroenterology;  Laterality: N/A;   ESOPHAGOGASTRODUODENOSCOPY (EGD) WITH PROPOFOL N/A 09/11/2021   Procedure: ESOPHAGOGASTRODUODENOSCOPY (EGD) WITH PROPOFOL;  Surgeon: Rush Landmark Telford Nab., MD;  [facility];  Service: Gastroenterology;  Laterality: N/A;   EUS N/A 08/24/2021   Procedure: UPPER ENDOSCOPIC ULTRASOUND (EUS) LINEAR;  Surgeon: Irving Copas., MD;  Location: [facility];  Service: Gastroenterology;  Laterality: N/A;   EUS N/A 09/11/2021   Procedure: UPPER ENDOSCOPIC ULTRASOUND (EUS) LINEAR;  Surgeon: Irving Copas., MD;  [facility];  Service: Gastroenterology;  Laterality: N/A;   FINE NEEDLE ASPIRATION  08/24/2021   Procedure: FINE NEEDLE ASPIRATION;  Surgeon: Rush Landmark Telford Nab., MD;  Location: [facility];  Service: Gastroenterology;;   FINE NEEDLE ASPIRATION  09/11/2021   Procedure: FINE NEEDLE ASPIRATION (FNA) LINEAR;  Surgeon: Irving Copas., MD;  Location: [facility];  Service: Gastroenterology;;  Delaine Lame. not in system to charge   TONSILLECTOMY  04/08/57   some date around then   TUBAL LIGATION  10/21/1984   UPPER ESOPHAGEAL ENDOSCOPIC ULTRASOUND (EUS) N/A 08/11/2021   Procedure: UPPER ESOPHAGEAL ENDOSCOPIC ULTRASOUND (EUS);  Surgeon: Carol Ada, MD;  Location: [facility];  Service: Endoscopy;  Laterality: N/A;    There were no vitals filed for this visit.   Subjective Assessment - 10/03/21 1557     Subjective  Feeling better since her falling episode, No worse in her back pain since this episode    Pertinent History fibromyalgia, bipolar, arthritis, osteoporosis    Limitations Walking;House hold activities;Standing;Lifting    Currently in Pain? Yes    Pain Score 4     Pain Location Back    Pain Orientation Lower    Pain Descriptors / Indicators Aching    Pain Type Chronic pain                [facility] PT  Assessment - 10/03/21 0001       Assessment   Medical Diagnosis LBP/ LE weakness    Referring Provider (PT) Bing Matter Pa-C                           [facility] Adult PT Treatment/Exercise - 10/03/21 0001       Lumbar Exercises: Stretches   Single Knee to Chest Stretch Right;Left;2 reps;10 seconds    Double Knee to Chest Stretch 2 reps;10 seconds      Lumbar Exercises: Supine   Pelvic Tilt 10 reps;5 seconds    Bent Knee Raise 10 reps    Bent Knee Raise Limitations with emphasis on slow eccentric    Bridge 20 reps    Straight Leg Raise 10 reps      Lumbar Exercises: Sidelying   Clam 20 reps                     PT Education - 10/03/21 1643     Education Details HEP additions and handout reviewed. Demonstrated seated exercises for seniors on YouTube for home performance    Person(s) Educated Patient    Methods Explanation;Demonstration;Handout    Comprehension Verbalized understanding              PT Short Term Goals - 09/18/21 1358       PT SHORT TERM GOAL #1   Title Patient will be independent with initial HEP and self-management strategies to improve functional outcomes    Time 3    Period Weeks    Status On-going    Target Date 10/05/21               PT Long Term Goals - 09/18/21 1359       PT LONG TERM GOAL #1   Title Patient will be independent with advance HEP and self-management strategies to improve functional outcomes    Time 6    Period Weeks    Status On-going      PT LONG TERM GOAL #2   Title Patient will improve FOTO score to predicted value to indicate improvement in functional outcomes    Time 6    Period Weeks    Status On-going      PT LONG TERM GOAL #3   Title Patient will be able to ambulate at least 275 feet during 2MWT with LRAD to demonstrate improved ability to perform functional mobility and associated tasks.    Time 6    Period Weeks    Status On-going      PT LONG TERM GOAL #4   Title  Patient will have equal to or > 4+/5 MMT throughout BLE to improve ability to perform functional mobility, stair ambulation and ADLs.    Time 6    Period Weeks    Status On-going  Plan - 10/03/21 1642     Clinical Impression Statement Progressing with core/trunk stabilization exercises to improve proximal strength and lumbar mechanics. Frequent cues for sequence and execution of exercises and pt reports she has not been performing HEP due to recent hx of falling.  Demonstrates onset of fatigue requiring rest periods every 10th rep to brief recovery. Continued sessions to improve strength, balance, and reduce risk for falls    Personal Factors and Comorbidities Past/Current Experience;Comorbidity 3+    Comorbidities fibromyalgia, frequent falls, osteoporosis, arthritis    Examination-Activity Limitations Locomotion Level;Bed Mobility;Bend;Transfers;Lift;Bathing;Stand;Stairs;Squat;Dressing    Examination-Participation Restrictions Cleaning;Laundry;Shop;Community Activity;Yard Work    Stability/Clinical Decision Making Stable/Uncomplicated    Rehab Potential Good    PT Frequency 2x / week    PT Duration 6 weeks    PT Treatment/Interventions Patient/family education;Gait training;Stair training;Functional mobility training;Therapeutic activities;Therapeutic exercise;Balance training;Manual techniques;ADLs/Self Care Home Management;Biofeedback;Cryotherapy;Fluidtherapy;Parrafin;Ultrasound;Traction;Moist Heat;Iontophoresis 4mg /ml Dexamethasone;DME Instruction;Vestibular;Visual/perceptual remediation/compensation;Passive range of motion;Dry needling;Neuromuscular re-education;Energy conservation;Spinal Manipulations;Manual lymph drainage;Splinting;Joint Manipulations;Orthotic Fit/Training;Compression bandaging;Taping;Vasopneumatic Device;Electrical Stimulation;Contrast Bath;Canalith Repostioning    PT Next Visit Plan Progress core and glute strengthening as able. Balance and gait  when ready. Next session begin standing heelraises, hip strengthening and sit to stands.  Update HEP    PT Home Exercise Plan Eval: SLR, pelvic tilt, heel raise 9/28 standing hip abd    Consulted and Agree with Plan of Care Patient             Patient will benefit from skilled therapeutic intervention in order to improve the following deficits and impairments:  Pain, Abnormal gait, Decreased activity tolerance, Decreased balance, Decreased strength, Difficulty walking, Improper body mechanics, Decreased mobility, Decreased endurance, Decreased range of motion, Hypomobility, Impaired flexibility  Visit Diagnosis: Low back pain, unspecified back pain laterality, unspecified chronicity, unspecified whether sciatica present  Muscle weakness (generalized)  Difficulty in walking, not elsewhere classified  History of falling     Problem List Patient Active Problem List   Diagnosis Date Noted   Back pain, chronic 04/03/2013   Anxiety 12/17/2012   Vitamin D insufficiency 12/17/2012   GERD (gastroesophageal reflux disease) 11/18/2012   Bipolar 1 disorder ([place]) 11/18/2012   Fibromyalgia 11/18/2012    4:44 PM, 10/03/21 M. Sherlyn Lees, PT, DPT Physical Therapist- [facility] Office Number: [phone]   [facility] [address] [place], [place], [address] Phone: [phone]   Fax:  [phone]  Name: Katherine Middleton MRN: [id number] Date of Birth: 1951/08/14

## 2021-10-05 ENCOUNTER — Encounter (HOSPITAL_COMMUNITY): Payer: Medicare HMO | Admitting: Physical Therapy

## 2021-10-10 ENCOUNTER — Ambulatory Visit (HOSPITAL_COMMUNITY): Payer: Medicare HMO

## 2021-10-10 ENCOUNTER — Other Ambulatory Visit: Payer: Self-pay

## 2021-10-10 DIAGNOSIS — M6281 Muscle weakness (generalized): Secondary | ICD-10-CM

## 2021-10-10 DIAGNOSIS — M545 Low back pain, unspecified: Secondary | ICD-10-CM

## 2021-10-10 DIAGNOSIS — Z9181 History of falling: Secondary | ICD-10-CM

## 2021-10-10 DIAGNOSIS — R262 Difficulty in walking, not elsewhere classified: Secondary | ICD-10-CM

## 2021-10-10 NOTE — Therapy (Signed)
[facility] [address] [place], [place], [address] Phone: [phone]   Fax:  [phone]  Physical Therapy Treatment  Patient Details  Name: Katherine Middleton MRN: [id number] Date of Birth: 1951/04/23 Referring Provider (PT): Bing Matter Pa-C   Encounter Date: 10/10/2021   PT End of Session - 10/10/21 1556     Visit Number 5    Number of Visits 12    Date for PT Re-Evaluation 10/26/21    Authorization Type [employer] approved 12 visits 9/15-10/27    Authorization - Visit Number 5    Authorization - Number of Visits 12    Progress Note Due on Visit 10    PT Start Time 1600    PT Stop Time 1645    PT Time Calculation (min) 45 min    Equipment Utilized During Treatment Gait belt    Activity Tolerance Patient tolerated treatment well;Patient limited by fatigue    Behavior During Therapy [facility] for tasks assessed/performed             Past Medical History:  Diagnosis Date   Anxiety    Arthritis    Bipolar disorder ([place])    Depression    Fibromyalgia    GERD (gastroesophageal reflux disease) 12/31/2002   HLD (hyperlipidemia)    diet controlled - no meds   HSV infection    Liver mass 07/2021   Osteoarthritis    Prolonged Q-T interval on ECG 02/2021   Sinusitis 12/31/2012   Wears glasses     Past Surgical History:  Procedure Laterality Date   BIOPSY  08/24/2021   Procedure: BIOPSY;  Surgeon: Irving Copas., MD;  Location: [place];  Service: Gastroenterology;;   BIOPSY  09/11/2021   Procedure: BIOPSY;  Surgeon: Irving Copas., MD;  [facility];  Service: Gastroenterology;;   ESOPHAGOGASTRODUODENOSCOPY (EGD) WITH PROPOFOL N/A 08/11/2021   Procedure: ESOPHAGOGASTRODUODENOSCOPY (EGD) WITH PROPOFOL;  Surgeon: Carol Ada, MD;  [facility];  Service: Endoscopy;  Laterality: N/A;   ESOPHAGOGASTRODUODENOSCOPY (EGD) WITH PROPOFOL N/A 08/24/2021   Procedure: ESOPHAGOGASTRODUODENOSCOPY  (EGD) WITH PROPOFOL;  Surgeon: Rush Landmark Telford Nab., MD;  Location: [facility];  Service: Gastroenterology;  Laterality: N/A;   ESOPHAGOGASTRODUODENOSCOPY (EGD) WITH PROPOFOL N/A 09/11/2021   Procedure: ESOPHAGOGASTRODUODENOSCOPY (EGD) WITH PROPOFOL;  Surgeon: Rush Landmark Telford Nab., MD;  [facility];  Service: Gastroenterology;  Laterality: N/A;   EUS N/A 08/24/2021   Procedure: UPPER ENDOSCOPIC ULTRASOUND (EUS) LINEAR;  Surgeon: Irving Copas., MD;  Location: [facility];  Service: Gastroenterology;  Laterality: N/A;   EUS N/A 09/11/2021   Procedure: UPPER ENDOSCOPIC ULTRASOUND (EUS) LINEAR;  Surgeon: Irving Copas., MD;  [facility];  Service: Gastroenterology;  Laterality: N/A;   FINE NEEDLE ASPIRATION  08/24/2021   Procedure: FINE NEEDLE ASPIRATION;  Surgeon: Rush Landmark Telford Nab., MD;  Location: [facility];  Service: Gastroenterology;;   FINE NEEDLE ASPIRATION  09/11/2021   Procedure: FINE NEEDLE ASPIRATION (FNA) LINEAR;  Surgeon: Irving Copas., MD;  [facility];  Service: Gastroenterology;;  Delaine Lame. not in system to charge   TONSILLECTOMY  04/08/57   some date around then   TUBAL LIGATION  10/21/1984   UPPER ESOPHAGEAL ENDOSCOPIC ULTRASOUND (EUS) N/A 08/11/2021   Procedure: UPPER ESOPHAGEAL ENDOSCOPIC ULTRASOUND (EUS);  Surgeon: Carol Ada, MD;  Location[facility];  Service: Endoscopy;  Laterality: N/A;    There were no vitals filed for this visit.   Subjective Assessment - 10/10/21 1600     Subjective  Feeling a little better and no recent falls. Dizziness noted when laying down and rolling in bed    Pertinent History fibromyalgia, bipolar, arthritis, osteoporosis    Limitations Walking;House hold activities;Standing;Lifting    Currently in Pain? Yes    Pain Score 4     Pain Location Back    Pain Orientation Lower    Pain Descriptors / Indicators Aching    Pain Type Chronic pain                 [facility] PT Assessment - 10/10/21 0001       Assessment   Medical Diagnosis LBP/ LE weakness    Referring Provider (PT) Bing Matter Pa-C                           [facility] Adult PT Treatment/Exercise - 10/10/21 0001       Neuro Re-ed    Neuro Re-ed Details  Dix-Hallpike performed and demo left upbeat nystagmus with head rotated to left. Dizziness period of 35 seconds. Epley maneuver performed with initial position of left head/neck rotation left, then proceeding to right rotation, and right log roll.      Lumbar Exercises: Seated   Long Arc Quad on Chair Strengthening;Both;3 sets;10 reps    LAQ on Chair Weights (lbs) 3#    Other Seated Lumbar Exercises ball squeeze 3x10, seated hip abd with red loop 3x10                     PT Education - 10/10/21 1624     Education Details education on vertigo presentation and use of Epley maneuver. Advised to sleep upright tonight (35-45 degrees) and to not bend over to assess if maneuver is successful    Person(s) Educated Patient    Methods Explanation;Demonstration;Handout    Comprehension Verbalized understanding              PT Short Term Goals - 09/18/21 1358       PT SHORT TERM GOAL #1   Title Patient will be independent with initial HEP and self-management strategies to improve functional outcomes    Time 3    Period Weeks    Status On-going    Target Date 10/05/21               PT Long Term Goals - 09/18/21 1359       PT LONG TERM GOAL #1   Title Patient will be independent with advance HEP and self-management strategies to improve functional outcomes    Time 6    Period Weeks    Status On-going      PT LONG TERM GOAL #2   Title Patient will improve FOTO score to predicted value to indicate improvement in functional outcomes    Time 6    Period Weeks    Status On-going      PT LONG TERM GOAL #3   Title Patient will be able to ambulate at least 275 feet during 2MWT with LRAD to  demonstrate improved ability to perform functional mobility and associated tasks.    Time 6    Period Weeks    Status On-going      PT LONG TERM GOAL #4   Title Patient will have equal to or > 4+/5 MMT throughout BLE to improve ability to perform functional mobility, stair ambulation and ADLs.    Time 6    Period Weeks  Status On-going                   Plan - 10/10/21 1632     Clinical Impression Statement Pt experiencing left upbeating nystagmus when transitioning sit to supine with head rotation left. Performed Epley maneuver after identification. Tx session modified to perform upright sitting strengthening to accommodate Epley maneuver.  Continued sessions indicated to improve balance, reduce back pain, and reduce risk for falls    Personal Factors and Comorbidities Past/Current Experience;Comorbidity 3+    Comorbidities fibromyalgia, frequent falls, osteoporosis, arthritis    Examination-Activity Limitations Locomotion Level;Bed Mobility;Bend;Transfers;Lift;Bathing;Stand;Stairs;Squat;Dressing    Examination-Participation Restrictions Cleaning;Laundry;Shop;Community Activity;Yard Work    Stability/Clinical Decision Making Stable/Uncomplicated    Rehab Potential Good    PT Frequency 2x / week    PT Duration 6 weeks    PT Treatment/Interventions Patient/family education;Gait training;Stair training;Functional mobility training;Therapeutic activities;Therapeutic exercise;Balance training;Manual techniques;ADLs/Self Care Home Management;Biofeedback;Cryotherapy;Fluidtherapy;Parrafin;Ultrasound;Traction;Moist Heat;Iontophoresis 4mg /ml Dexamethasone;DME Instruction;Vestibular;Visual/perceptual remediation/compensation;Passive range of motion;Dry needling;Neuromuscular re-education;Energy conservation;Spinal Manipulations;Manual lymph drainage;Splinting;Joint Manipulations;Orthotic Fit/Training;Compression bandaging;Taping;Vasopneumatic Device;Electrical Stimulation;Contrast  Bath;Canalith Repostioning    PT Next Visit Plan Progress core and glute strengthening as able. Balance and gait when ready. Next session begin standing heelraises, hip strengthening and sit to stands.  Update HEP    PT Home Exercise Plan Eval: SLR, pelvic tilt, heel raise 9/28 standing hip abd    Consulted and Agree with Plan of Care Patient             Patient will benefit from skilled therapeutic intervention in order to improve the following deficits and impairments:  Pain, Abnormal gait, Decreased activity tolerance, Decreased balance, Decreased strength, Difficulty walking, Improper body mechanics, Decreased mobility, Decreased endurance, Decreased range of motion, Hypomobility, Impaired flexibility  Visit Diagnosis: Low back pain, unspecified back pain laterality, unspecified chronicity, unspecified whether sciatica present  Muscle weakness (generalized)  Difficulty in walking, not elsewhere classified  History of falling     Problem List Patient Active Problem List   Diagnosis Date Noted   Back pain, chronic 04/03/2013   Anxiety 12/17/2012   Vitamin D insufficiency 12/17/2012   GERD (gastroesophageal reflux disease) 11/18/2012   Bipolar 1 disorder ([place]) 11/18/2012   Fibromyalgia 11/18/2012    Toniann Fail, PT 10/10/2021, 4:37 PM  [place], [place], [address] Phone: [phone]   Fax:  [phone]  Name: Katherine Middleton MRN: [id number] Date of Birth: 1951/05/10

## 2021-10-12 ENCOUNTER — Ambulatory Visit (HOSPITAL_COMMUNITY): Payer: Medicare HMO | Admitting: Physical Therapy

## 2021-10-17 ENCOUNTER — Ambulatory Visit (HOSPITAL_COMMUNITY): Payer: Medicare HMO | Admitting: Physical Therapy

## 2021-10-17 ENCOUNTER — Other Ambulatory Visit: Payer: Self-pay

## 2021-10-17 DIAGNOSIS — M6281 Muscle weakness (generalized): Secondary | ICD-10-CM

## 2021-10-17 DIAGNOSIS — M545 Low back pain, unspecified: Secondary | ICD-10-CM

## 2021-10-17 DIAGNOSIS — R262 Difficulty in walking, not elsewhere classified: Secondary | ICD-10-CM

## 2021-10-17 DIAGNOSIS — Z9181 History of falling: Secondary | ICD-10-CM

## 2021-10-17 NOTE — Therapy (Signed)
[facility] [address] [place], [place], [address] Phone: [phone]   Fax:  [phone]  Physical Therapy Treatment  Patient Details  Name: Katherine Middleton MRN: [id number] Date of Birth: 28-Mar-1951 Referring Provider (PT): Bing Matter Pa-C   Encounter Date: 10/17/2021   PT End of Session - 10/17/21 1622     Visit Number 6    Number of Visits 12    Date for PT Re-Evaluation 10/26/21    Authorization Type [employer] approved 12 visits 9/15-10/27    Authorization - Visit Number 6    Authorization - Number of Visits 12    Progress Note Due on Visit 10    PT Start Time 0947    PT Stop Time 1700    PT Time Calculation (min) 39 min    Equipment Utilized During Treatment Gait belt    Activity Tolerance Patient tolerated treatment well;Patient limited by fatigue    Behavior During Therapy [facility] for tasks assessed/performed             Past Medical History:  Diagnosis Date   Anxiety    Arthritis    Bipolar disorder ([place])    Depression    Fibromyalgia    GERD (gastroesophageal reflux disease) 12/31/2002   HLD (hyperlipidemia)    diet controlled - no meds   HSV infection    Liver mass 07/2021   Osteoarthritis    Prolonged Q-T interval on ECG 02/2021   Sinusitis 12/31/2012   Wears glasses     Past Surgical History:  Procedure Laterality Date   BIOPSY  08/24/2021   Procedure: BIOPSY;  Surgeon: Irving Copas., MD;  Location: [facility];  Service: Gastroenterology;;   BIOPSY  09/11/2021   Procedure: BIOPSY;  Surgeon: Irving Copas., MD;  [facility];  Service: Gastroenterology;;   ESOPHAGOGASTRODUODENOSCOPY (EGD) WITH PROPOFOL N/A 08/11/2021   Procedure: ESOPHAGOGASTRODUODENOSCOPY (EGD) WITH PROPOFOL;  Surgeon: Carol Ada, MD;  [facility];  Service: Endoscopy;  Laterality: N/A;   ESOPHAGOGASTRODUODENOSCOPY (EGD) WITH PROPOFOL N/A 08/24/2021   Procedure: ESOPHAGOGASTRODUODENOSCOPY  (EGD) WITH PROPOFOL;  Surgeon: Rush Landmark Telford Nab., MD;  Location: [facility];  Service: Gastroenterology;  Laterality: N/A;   ESOPHAGOGASTRODUODENOSCOPY (EGD) WITH PROPOFOL N/A 09/11/2021   Procedure: ESOPHAGOGASTRODUODENOSCOPY (EGD) WITH PROPOFOL;  Surgeon: Rush Landmark Telford Nab., MD;  [facility];  Service: Gastroenterology;  Laterality: N/A;   EUS N/A 08/24/2021   Procedure: UPPER ENDOSCOPIC ULTRASOUND (EUS) LINEAR;  Surgeon: Irving Copas., MD;  Location: [facility] [place];  Service: Gastroenterology;  Laterality: N/A;   EUS N/A 09/11/2021   Procedure: UPPER ENDOSCOPIC ULTRASOUND (EUS) LINEAR;  Surgeon: Irving Copas., MD;  [facility];  Service: Gastroenterology;  Laterality: N/A;   FINE NEEDLE ASPIRATION  08/24/2021   Procedure: FINE NEEDLE ASPIRATION;  Surgeon: Rush Landmark Telford Nab., MD;  Location: [place];  Service: Gastroenterology;;   FINE NEEDLE ASPIRATION  09/11/2021   Procedure: FINE NEEDLE ASPIRATION (FNA) LINEAR;  Surgeon: Irving Copas., MD;  [facility];  Service: Gastroenterology;;  Delaine Lame. not in system to charge   TONSILLECTOMY  04/08/57   some date around then   TUBAL LIGATION  10/21/1984   UPPER ESOPHAGEAL ENDOSCOPIC ULTRASOUND (EUS) N/A 08/11/2021   Procedure: UPPER ESOPHAGEAL ENDOSCOPIC ULTRASOUND (EUS);  Surgeon: Carol Ada, MD;  [facility];  Service: Endoscopy;  Laterality: N/A;    There were no vitals filed for this visit.   Subjective Assessment - 10/17/21 1623     Subjective  States she has been having some shoulder pain. States her dizziness was better after last session but came right back reports current dizziness at 3/10 and pain is 8/10    Pertinent History fibromyalgia, bipolar, arthritis, osteoporosis    Limitations Walking;House hold activities;Standing;Lifting    Currently in Pain? Yes    Pain Score 8     Pain Location Shoulder    Pain Orientation Right    Pain  Descriptors / Indicators Aching                [facility] PT Assessment - 10/17/21 0001       Assessment   Medical Diagnosis LBP/ LE weakness    Referring Provider (PT) Bing Matter Pa-C                 Vestibular Assessment - 10/17/21 0001       Oculomotor Exam   Smooth Pursuits Intact    Saccades Intact      Positional Testing   Dix-Hallpike Dix-Hallpike Right;Dix-Hallpike Left      Dix-Hallpike Right   Dix-Hallpike Right Duration --   negative     Positional Sensitivities   Head Turning x 5 No dizziness                      [facility] Adult PT Treatment/Exercise - 10/17/21 0001       Lumbar Exercises: Stretches   Active Hamstring Stretch Left;Right;3 reps;30 seconds    Other Lumbar Stretch Exercise seated rotation2s x10 10" holds B    Other Lumbar Stretch Exercise seated lumbar SB 2x10 10" holds B             Vestibular Treatment/Exercise - 10/17/21 0001       Vestibular Treatment/Exercise   Vestibular Treatment Provided Canalith Repositioning    Canalith Repositioning Epley Manuever Left       EPLEY MANUEVER LEFT   Number of Reps  2    Overall Response  Improved Symptoms     RESPONSE DETAILS LEFT no symptoms after second treatment                      PT Short Term Goals - 09/18/21 1358       PT SHORT TERM GOAL #1   Title Patient will be independent with initial HEP and self-management strategies to improve functional outcomes    Time 3    Period Weeks    Status On-going    Target Date 10/05/21               PT Long Term Goals - 09/18/21 1359       PT LONG TERM GOAL #1   Title Patient will be independent with advance HEP and self-management strategies to improve functional outcomes    Time 6    Period Weeks    Status On-going      PT LONG TERM GOAL #2   Title Patient will improve FOTO score to predicted value to indicate improvement in functional outcomes    Time 6    Period Weeks    Status On-going       PT LONG TERM GOAL #3   Title Patient will be able to ambulate at least 275 feet during 2MWT with LRAD to demonstrate improved ability to perform functional mobility and associated tasks.    Time 6    Period Weeks    Status On-going      PT LONG TERM GOAL #  4   Title Patient will have equal to or > 4+/5 MMT throughout BLE to improve ability to perform functional mobility, stair ambulation and ADLs.    Time 6    Period Weeks    Status On-going                   Plan - 10/17/21 1622     Clinical Impression Statement session focused on vertigo symptoms and patient with positive left dix hallpike, followed throughout with left Epley. First time with left Epley patient demonstrated nystagmus and symptoms and initially symptoms increased. After rest break patient reported baseline symptoms. Performed Epley again and patient with no symptoms noted afterwards. Transitioned to lumbar stretches secondary to back pain noted on this date. Wil continue with balance training next session as tolerated and follow up with vertigo symptoms and treatment as indicated.    Personal Factors and Comorbidities Past/Current Experience;Comorbidity 3+    Comorbidities fibromyalgia, frequent falls, osteoporosis, arthritis    Examination-Activity Limitations Locomotion Level;Bed Mobility;Bend;Transfers;Lift;Bathing;Stand;Stairs;Squat;Dressing    Examination-Participation Restrictions Cleaning;Laundry;Shop;Community Activity;Yard Work    Stability/Clinical Decision Making Stable/Uncomplicated    Rehab Potential Good    PT Frequency 2x / week    PT Duration 6 weeks    PT Treatment/Interventions Patient/family education;Gait training;Stair training;Functional mobility training;Therapeutic activities;Therapeutic exercise;Balance training;Manual techniques;ADLs/Self Care Home Management;Biofeedback;Cryotherapy;Fluidtherapy;Parrafin;Ultrasound;Traction;Moist Heat;Iontophoresis 4mg /ml Dexamethasone;DME  Instruction;Vestibular;Visual/perceptual remediation/compensation;Passive range of motion;Dry needling;Neuromuscular re-education;Energy conservation;Spinal Manipulations;Manual lymph drainage;Splinting;Joint Manipulations;Orthotic Fit/Training;Compression bandaging;Taping;Vasopneumatic Device;Electrical Stimulation;Contrast Bath;Canalith Repostioning    PT Next Visit Plan f/u with vertigo symptoms as indicated. Progress core and glute strengthening as able. Balance and gait when ready. Next session begin standing heelraises, hip strengthening and sit to stands.  Update HEP    PT Home Exercise Plan Eval: SLR, pelvic tilt, heel raise 9/28 standing hip abd; 10/18 lumbar stretches    Consulted and Agree with Plan of Care Patient             Patient will benefit from skilled therapeutic intervention in order to improve the following deficits and impairments:  Pain, Abnormal gait, Decreased activity tolerance, Decreased balance, Decreased strength, Difficulty walking, Improper body mechanics, Decreased mobility, Decreased endurance, Decreased range of motion, Hypomobility, Impaired flexibility  Visit Diagnosis: Low back pain, unspecified back pain laterality, unspecified chronicity, unspecified whether sciatica present  Muscle weakness (generalized)  History of falling  Difficulty in walking, not elsewhere classified     Problem List Patient Active Problem List   Diagnosis Date Noted   Back pain, chronic 04/03/2013   Anxiety 12/17/2012   Vitamin D insufficiency 12/17/2012   GERD (gastroesophageal reflux disease) 11/18/2012   Bipolar 1 disorder ([place]) 11/18/2012   Fibromyalgia 11/18/2012   5:00 PM, 10/17/21 Jerene Pitch, DPT Physical Therapy with [facility]  [phone] office   [facility] [address] [place], [place], [address] Phone: [phone]   Fax:  [phone]  Name: Katherine Middleton MRN:  [id number] Date of Birth: 28-Jan-1951

## 2021-10-19 ENCOUNTER — Encounter (HOSPITAL_COMMUNITY): Payer: Medicare HMO | Admitting: Physical Therapy

## 2021-10-24 ENCOUNTER — Ambulatory Visit (HOSPITAL_COMMUNITY): Payer: Medicare HMO

## 2021-10-24 ENCOUNTER — Other Ambulatory Visit: Payer: Self-pay

## 2021-10-24 DIAGNOSIS — M545 Low back pain, unspecified: Secondary | ICD-10-CM

## 2021-10-24 DIAGNOSIS — R262 Difficulty in walking, not elsewhere classified: Secondary | ICD-10-CM

## 2021-10-24 DIAGNOSIS — Z9181 History of falling: Secondary | ICD-10-CM

## 2021-10-24 DIAGNOSIS — M6281 Muscle weakness (generalized): Secondary | ICD-10-CM

## 2021-10-24 NOTE — Therapy (Signed)
[facility] [address] [place], [place], [address] Phone: [phone]   Fax:  [phone]  Physical Therapy Treatment  Patient Details  Name: Katherine Middleton MRN: [id number] Date of Birth: [birth year] Referring Provider (PT): Bing Matter Pa-C   Encounter Date: 10/24/2021   PT End of Session - 10/24/21 1608     Visit Number 7    Number of Visits 12    Date for PT Re-Evaluation 10/26/21    Authorization Type [employer] approved 12 visits 9/15-10/27    Authorization - Visit Number 7    Authorization - Number of Visits 12    Progress Note Due on Visit 10    PT Start Time 1600    PT Stop Time 1645    PT Time Calculation (min) 45 min    Equipment Utilized During Treatment Gait belt    Activity Tolerance Patient tolerated treatment well;Patient limited by fatigue    Behavior During Therapy [facility] for tasks assessed/performed             Past Medical History:  Diagnosis Date   Anxiety    Arthritis    Bipolar disorder ([place])    Depression    Fibromyalgia    GERD (gastroesophageal reflux disease) 12/31/2002   HLD (hyperlipidemia)    diet controlled - no meds   HSV infection    Liver mass 07/2021   Osteoarthritis    Prolonged Q-T interval on ECG 02/2021   Sinusitis 12/31/2012   Wears glasses     Past Surgical History:  Procedure Laterality Date   BIOPSY  08/24/2021   Procedure: BIOPSY;  Surgeon: Irving Copas., MD;  Location: [place];  Service: Gastroenterology;;   BIOPSY  09/11/2021   Procedure: BIOPSY;  Surgeon: Irving Copas., MD;  [facility]: [facility];  Service: Gastroenterology;;   ESOPHAGOGASTRODUODENOSCOPY (EGD) WITH PROPOFOL N/A 08/11/2021   Procedure: ESOPHAGOGASTRODUODENOSCOPY (EGD) WITH PROPOFOL;  Surgeon: Carol Ada, MD;  [facility];  Service: Endoscopy;  Laterality: N/A;   ESOPHAGOGASTRODUODENOSCOPY (EGD) WITH PROPOFOL N/A 08/24/2021   Procedure: ESOPHAGOGASTRODUODENOSCOPY  (EGD) WITH PROPOFOL;  Surgeon: Rush Landmark Telford Nab., MD;  Location: [place];  Service: Gastroenterology;  Laterality: N/A;   ESOPHAGOGASTRODUODENOSCOPY (EGD) WITH PROPOFOL N/A 09/11/2021   Procedure: ESOPHAGOGASTRODUODENOSCOPY (EGD) WITH PROPOFOL;  Surgeon: Rush Landmark Telford Nab., MD;  [facility];  Service: Gastroenterology;  Laterality: N/A;   EUS N/A 08/24/2021   Procedure: UPPER ENDOSCOPIC ULTRASOUND (EUS) LINEAR;  Surgeon: Irving Copas., MD;  Location: [place];  Service: Gastroenterology;  Laterality: N/A;   EUS N/A 09/11/2021   Procedure: UPPER ENDOSCOPIC ULTRASOUND (EUS) LINEAR;  Surgeon: Irving Copas., MD;  [facility];  Service: Gastroenterology;  Laterality: N/A;   FINE NEEDLE ASPIRATION  08/24/2021   Procedure: FINE NEEDLE ASPIRATION;  Surgeon: Rush Landmark Telford Nab., MD;  Location: [facility];  Service: Gastroenterology;;   FINE NEEDLE ASPIRATION  09/11/2021   Procedure: FINE NEEDLE ASPIRATION (FNA) LINEAR;  Surgeon: Irving Copas., MD;  [facility];  Service: Gastroenterology;;  Delaine Lame. not in system to charge   TONSILLECTOMY  04/08/57   some date around then   TUBAL LIGATION  10/21/1984   UPPER ESOPHAGEAL ENDOSCOPIC ULTRASOUND (EUS) N/A 08/11/2021   Procedure: UPPER ESOPHAGEAL ENDOSCOPIC ULTRASOUND (EUS);  Surgeon: Carol Ada, MD;  Location[facility];  Service: Endoscopy;  Laterality: N/A;    There were no vitals filed for this visit.   Subjective Assessment - 10/24/21 1607     Subjective  Not as dizzy, still somewhat. Back and legs still hurt    Pertinent History fibromyalgia, bipolar, arthritis, osteoporosis    Limitations Walking;House hold activities;Standing;Lifting    Currently in Pain? Yes    Pain Score 4     Pain Location Back    Pain Orientation Lower    Pain Descriptors / Indicators Aching    Pain Type Chronic pain                               [facility] Adult  PT Treatment/Exercise - 10/24/21 0001       Lumbar Exercises: Standing   Other Standing Lumbar Exercises static standing on airex pad 3x15 sec eyes open/closed. Tandem stance 3x15 sec left/right      Lumbar Exercises: Seated   Long Arc Quad on Chair Strengthening;Both;3 sets;10 reps    LAQ on Chair Weights (lbs) 2#      Lumbar Exercises: Supine   Bridge Other (comment)   3x10   Straight Leg Raise Other (comment)   3x10 2#     Knee/Hip Exercises: Supine   Short Arc Quad Sets Strengthening;Both;3 sets;10 reps    Short Arc Quad Sets Limitations 3#    Other Supine Knee/Hip Exercises ball squeeze 3x10                       PT Short Term Goals - 09/18/21 1358       PT SHORT TERM GOAL #1   Title Patient will be independent with initial HEP and self-management strategies to improve functional outcomes    Time 3    Period Weeks    Status On-going    Target Date 10/05/21               PT Long Term Goals - 09/18/21 1359       PT LONG TERM GOAL #1   Title Patient will be independent with advance HEP and self-management strategies to improve functional outcomes    Time 6    Period Weeks    Status On-going      PT LONG TERM GOAL #2   Title Patient will improve FOTO score to predicted value to indicate improvement in functional outcomes    Time 6    Period Weeks    Status On-going      PT LONG TERM GOAL #3   Title Patient will be able to ambulate at least 275 feet during 2MWT with LRAD to demonstrate improved ability to perform functional mobility and associated tasks.    Time 6    Period Weeks    Status On-going      PT LONG TERM GOAL #4   Title Patient will have equal to or > 4+/5 MMT throughout BLE to improve ability to perform functional mobility, stair ambulation and ADLs.    Time 6    Period Weeks    Status On-going                   Plan - 10/24/21 1642     Clinical Impression Statement Demonstrates improved BLE strength as evidenced  by use of ankle weights this session and increase in repetition without exertion.  Progressing with static standing balance maneuvers with improved ability to elicit righting reactions with narrow BOS. Decrease in dizziness reported by pt. Continued sesssion ot improve BLE strength and dynamic balance to reduce risk for falls    Personal  Factors and Comorbidities Past/Current Experience;Comorbidity 3+    Comorbidities fibromyalgia, frequent falls, osteoporosis, arthritis    Examination-Activity Limitations Locomotion Level;Bed Mobility;Bend;Transfers;Lift;Bathing;Stand;Stairs;Squat;Dressing    Examination-Participation Restrictions Cleaning;Laundry;Shop;Community Activity;Yard Work    Stability/Clinical Decision Making Stable/Uncomplicated    Rehab Potential Good    PT Frequency 2x / week    PT Duration 6 weeks    PT Treatment/Interventions Patient/family education;Gait training;Stair training;Functional mobility training;Therapeutic activities;Therapeutic exercise;Balance training;Manual techniques;ADLs/Self Care Home Management;Biofeedback;Cryotherapy;Fluidtherapy;Parrafin;Ultrasound;Traction;Moist Heat;Iontophoresis 4mg /ml Dexamethasone;DME Instruction;Vestibular;Visual/perceptual remediation/compensation;Passive range of motion;Dry needling;Neuromuscular re-education;Energy conservation;Spinal Manipulations;Manual lymph drainage;Splinting;Joint Manipulations;Orthotic Fit/Training;Compression bandaging;Taping;Vasopneumatic Device;Electrical Stimulation;Contrast Bath;Canalith Repostioning    PT Next Visit Plan f/u with vertigo symptoms as indicated. Progress core and glute strengthening as able. Balance and gait when ready. Next session begin standing heelraises, hip strengthening and sit to stands.  Update HEP    PT Home Exercise Plan Eval: SLR, pelvic tilt, heel raise 9/28 standing hip abd; 10/18 lumbar stretches    Consulted and Agree with Plan of Care Patient             Patient will  benefit from skilled therapeutic intervention in order to improve the following deficits and impairments:  Pain, Abnormal gait, Decreased activity tolerance, Decreased balance, Decreased strength, Difficulty walking, Improper body mechanics, Decreased mobility, Decreased endurance, Decreased range of motion, Hypomobility, Impaired flexibility  Visit Diagnosis: Low back pain, unspecified back pain laterality, unspecified chronicity, unspecified whether sciatica present  Muscle weakness (generalized)  History of falling  Difficulty in walking, not elsewhere classified     Problem List Patient Active Problem List   Diagnosis Date Noted   Back pain, chronic 04/03/2013   Anxiety 12/17/2012   Vitamin D insufficiency 12/17/2012   GERD (gastroesophageal reflux disease) 11/18/2012   Bipolar 1 disorder ([place]) 11/18/2012   Fibromyalgia 11/18/2012    Toniann Fail, PT 10/24/2021, 4:43 PM  [place], [place], [address] Phone: [phone]   Fax:  [phone]  Name: Jelena Malicoat MRN: [id number] Date of Birth: 13-Feb-1951

## 2021-10-25 ENCOUNTER — Encounter (HOSPITAL_COMMUNITY): Payer: Self-pay | Admitting: Psychiatry

## 2021-10-25 ENCOUNTER — Telehealth (INDEPENDENT_AMBULATORY_CARE_PROVIDER_SITE_OTHER): Payer: Medicare HMO | Admitting: Psychiatry

## 2021-10-25 DIAGNOSIS — F319 Bipolar disorder, unspecified: Secondary | ICD-10-CM

## 2021-10-25 MED ORDER — TRAZODONE HCL 150 MG PO TABS: 150.0000 mg | ORAL_TABLET | Freq: Every day | ORAL | 2 refills | Status: DC

## 2021-10-25 MED ORDER — PAROXETINE HCL 40 MG PO TABS: 40.0000 mg | ORAL_TABLET | ORAL | 2 refills | Status: DC

## 2021-10-25 MED ORDER — ALPRAZOLAM 0.5 MG PO TABS: 0.5000 mg | ORAL_TABLET | Freq: Four times a day (QID) | ORAL | 1 refills | Status: DC | PRN

## 2021-10-25 MED ORDER — GABAPENTIN 300 MG PO CAPS: 600.0000 mg | ORAL_CAPSULE | Freq: Two times a day (BID) | ORAL | 2 refills | Status: DC

## 2021-10-25 NOTE — Progress Notes (Signed)
Virtual Visit via Telephone Note  I connected with Katherine Middleton on 10/25/21 at  2:00 PM EDT by telephone and verified that I am speaking with the correct person using two identifiers.  Location: Patient: home Provider: office   I discussed the limitations, risks, security and privacy concerns of performing an evaluation and management service by telephone and the availability of in person appointments. I also discussed with the patient that there may be a patient responsible charge related to this service. The patient expressed understanding and agreed to proceed.     I discussed the assessment and treatment plan with the patient. The patient was provided an opportunity to ask questions and all were answered. The patient agreed with the plan and demonstrated an understanding of the instructions.   The patient was advised to call back or seek an in-person evaluation if the symptoms worsen or if the condition fails to improve as anticipated.  I provided 15 minutes of non-face-to-face time during this encounter.   Levonne Spiller, MD  [facility] MD/PA/NP OP Progress Note  10/25/2021 2:17 PM [place]  MRN:  [id number]  Chief Complaint:  Chief Complaint   Depression; Anxiety; Follow-up    HPI: Patient is 70 year-old widowed Caucasian female who lives alone in [place]. . She is on disability for fibromyalgia arthritis and bipolar disorder.   The patient has been seeing a psychiatrist for years. Initially she was diagnosed with depression but eventually she developed some manic symptoms and now is designated as having bipolar disorder. She's often very tired due to her fibromyalgia and has chronic muscle aches and pains. She feels that the medicines she is on now been very helpful. She sleeping well and her mood is stable. She's not using drugs or alcohol denies suicidal ideation. She's never been psychotic but mentions having  "breakdown" about 10 years ago. She's never been  hospitalized.  Patient returns for follow-up after 3 months.  She is still not doing well in terms of her weight loss.  She is still around 97 pounds.  She has had numerous endoscopies abdominal CT and abdominal MRI.  She does have some pancreatic abnormalities which are not thought to be malignancy.  She is now on Creon.  She still has some mild pain but no nausea vomiting diarrhea.  She is obviously not absorbing nutrition however because she cannot seem to gain any weight.  Nevertheless she states that her mood is good and she denies significant depression anxiety or difficulty sleeping.  She enjoys spending time with her family. Visit Diagnosis:    ICD-10-CM   1. Bipolar 1 disorder (HCC)  F31.9 PARoxetine (PAXIL) 40 MG tablet    traZODone (DESYREL) 150 MG tablet      Past Psychiatric History: Long-term outpatient treatment  Past Medical History:  Past Medical History:  Diagnosis Date   Anxiety    Arthritis    Bipolar disorder ([place])    Depression    Fibromyalgia    GERD (gastroesophageal reflux disease) 12/31/2002   HLD (hyperlipidemia)    diet controlled - no meds   HSV infection    Liver mass 07/2021   Osteoarthritis    Prolonged Q-T interval on ECG 02/2021   Sinusitis 12/31/2012   Wears glasses     Past Surgical History:  Procedure Laterality Date   BIOPSY  08/24/2021   Procedure: BIOPSY;  Surgeon: Irving Copas., MD;  Location: [place];  Service: Gastroenterology;;   BIOPSY  09/11/2021   Procedure:  BIOPSY;  Surgeon: Irving Copas., MD;  Location: [facility];  Service: Gastroenterology;;   ESOPHAGOGASTRODUODENOSCOPY (EGD) WITH PROPOFOL N/A 08/11/2021   Procedure: ESOPHAGOGASTRODUODENOSCOPY (EGD) WITH PROPOFOL;  Surgeon: Carol Ada, MD;  [facility];  Service: Endoscopy;  Laterality: N/A;   ESOPHAGOGASTRODUODENOSCOPY (EGD) WITH PROPOFOL N/A 08/24/2021   Procedure: ESOPHAGOGASTRODUODENOSCOPY (EGD) WITH PROPOFOL;  Surgeon: Rush Landmark  Telford Nab., MD;  Location: [place];  Service: Gastroenterology;  Laterality: N/A;   ESOPHAGOGASTRODUODENOSCOPY (EGD) WITH PROPOFOL N/A 09/11/2021   Procedure: ESOPHAGOGASTRODUODENOSCOPY (EGD) WITH PROPOFOL;  Surgeon: Rush Landmark Telford Nab., MD;  [facility];  Service: Gastroenterology;  Laterality: N/A;   EUS N/A 08/24/2021   Procedure: UPPER ENDOSCOPIC ULTRASOUND (EUS) LINEAR;  Surgeon: Irving Copas., MD;  Location: [facility];  Service: Gastroenterology;  Laterality: N/A;   EUS N/A 09/11/2021   Procedure: UPPER ENDOSCOPIC ULTRASOUND (EUS) LINEAR;  Surgeon: Irving Copas., MD;  [facility];  Service: Gastroenterology;  Laterality: N/A;   FINE NEEDLE ASPIRATION  08/24/2021   Procedure: FINE NEEDLE ASPIRATION;  Surgeon: Rush Landmark Telford Nab., MD;  Location: [facility];  Service: Gastroenterology;;   FINE NEEDLE ASPIRATION  09/11/2021   Procedure: FINE NEEDLE ASPIRATION (FNA) LINEAR;  Surgeon: Irving Copas., MD;  [facility];  Service: Gastroenterology;;  Delaine Lame. not in system to charge   TONSILLECTOMY  04/08/57   some date around then   TUBAL LIGATION  10/21/1984   UPPER ESOPHAGEAL ENDOSCOPIC ULTRASOUND (EUS) N/A 08/11/2021   Procedure: UPPER ESOPHAGEAL ENDOSCOPIC ULTRASOUND (EUS);  Surgeon: Carol Ada, MD;  Location[facility];  Service: Endoscopy;  Laterality: N/A;    Family Psychiatric History: see below  Family History:  Family History  Problem Relation Age of Onset   Bipolar disorder Sister    Anxiety disorder Sister    Dementia Sister 42   Paranoid behavior Sister    Bipolar disorder Father    Schizophrenia Father    Alcohol abuse Maternal Uncle    Alcohol abuse Paternal Uncle    Anxiety disorder Mother    ADD / ADHD Other    ADD / ADHD Other    Healthy Other    OCD Neg Hx    Seizures Neg Hx    Sexual abuse Neg Hx    Physical abuse Neg Hx     Social History:  Social History   Socioeconomic  History   Marital status: Married    Spouse name: Not on file   Number of children: Not on file   Years of education: Not on file   Highest education level: Not on file  Occupational History   Not on file  Tobacco Use   Smoking status: Every Day    Packs/day: 0.50    Years: 30.00    Pack years: 15.00    Types: Cigarettes   Smokeless tobacco: Never   Tobacco comments:    11-15 cigs a day as of 05/01/2013  Vaping Use   Vaping Use: Never used  Substance and Sexual Activity   Alcohol use: No   Drug use: No   Sexual activity: Yes    Partners: Male    Birth control/protection: Post-menopausal  Other Topics Concern   Not on file  Social History Narrative   Left handed    Lives alone    Social Determinants of Health   Financial Resource Strain: Not on file  Food Insecurity: Not on file  Transportation Needs: Not on file  Physical Activity: Not on file  Stress: Not  on file  Social Connections: Not on file    Allergies:  Allergies  Allergen Reactions   Amoxicillin-Pot Clavulanate Anaphylaxis and Swelling   Cephalosporins Anaphylaxis    Other reaction(s): Unknown   Codeine Itching   Cymbalta [Duloxetine Hcl] Other (See Comments)    GERD   Duloxetine     Other reaction(s): Other (See Comments), Unknown GERD    Oxycodone Itching and Other (See Comments)    "OUT THERE", climbing the walls   Penicillins Anaphylaxis and Swelling    Throat swelling Reaction: 8-10 years ago   Baclofen Other (See Comments)    Anxiety got much worse and possibly ppted manic episode Other reaction(s): Unknown   Povidone Iodine Itching and Other (See Comments)    Particularly if in a douche  Other reaction(s): Unknown   Flonase [Fluticasone]     Makes eye pressure to increase   Propoxyphene Itching    Anxiety/jittery    Sulfa Antibiotics Swelling   Sulfasalazine Swelling    Other reaction(s): Unknown   Tramadol     Other reaction(s): Other (See Comments), Unknown Patient states that  this makes her nervous    Amitriptyline Other (See Comments)    Caused bowels to empty quickly and couldn't sleep on it. Other reaction(s): Other (See Comments), Unknown Caused bowels to empty quickly and couldn't sleep on it. Caused bowels to empty quickly and couldn't sleep on it. Caused bowels to empty quickly and couldn't sleep on it.    Aspirin Hives and Rash   Latex Swelling and Other (See Comments)    If in the mouth, it swells   Nsaids Nausea Only and Other (See Comments)    Almost passing out, bad acid reflux Other reaction(s): Other (See Comments) other   Tolmetin Nausea Only    Other reaction(s): Other (See Comments), Unknown Almost passing out, bad acid reflux Almost passing out, bad acid reflux     Metabolic Disorder Labs: No results found for: HGBA1C, MPG No results found for: PROLACTIN No results found for: CHOL, TRIG, HDL, CHOLHDL, VLDL, LDLCALC No results found for: TSH  Therapeutic Level Labs: No results found for: LITHIUM No results found for: VALPROATE No components found for:  CBMZ  Current Medications: Current Outpatient Medications  Medication Sig Dispense Refill   acetaminophen (TYLENOL) 650 MG CR tablet Take 650 mg by mouth daily as needed for pain.     ALPRAZolam (XANAX) 0.5 MG tablet Take 1 tablet (0.5 mg total) by mouth 4 (four) times daily as needed. 360 tablet 1   cetirizine (ZYRTEC) 10 MG tablet Take 10 mg by mouth at bedtime.     Cholecalciferol (VITAMIN D3) 125 MCG (5000 UT) TABS Take 5,000 Units by mouth daily.     gabapentin (NEURONTIN) 300 MG capsule Take 2 capsules (600 mg total) by mouth 2 (two) times daily. 480 capsule 2   omeprazole (PRILOSEC) 20 MG capsule Take 20 mg by mouth daily.      PARoxetine (PAXIL) 40 MG tablet Take 1 tablet (40 mg total) by mouth every morning. 90 tablet 2   Polyethyl Glyc-Propyl Glyc PF (SYSTANE ULTRA PF) 0.4-0.3 % SOLN Place 1 drop into both eyes daily as needed (Dry eye).     sodium chloride (OCEAN)  0.65 % SOLN nasal spray Place 1 spray into both nostrils as needed for congestion.     tiZANidine (ZANAFLEX) 4 MG tablet Take 4 mg by mouth every 8 (eight) hours as needed (muscle pain).     traZODone (DESYREL)  150 MG tablet Take 1 tablet (150 mg total) by mouth at bedtime. 90 tablet 2   vitamin B-12 (CYANOCOBALAMIN) 1000 MCG tablet Take 1,000 mcg by mouth daily.     No current facility-administered medications for this visit.     Musculoskeletal: Strength & Muscle Tone: na Gait & Station: na Patient leans: N/A  Psychiatric Specialty Exam: Review of Systems  Constitutional:  Positive for unexpected weight change.  Gastrointestinal:  Positive for abdominal pain.  Musculoskeletal:  Positive for arthralgias.  All other systems reviewed and are negative.  There were no vitals taken for this visit.There is no height or weight on file to calculate BMI.  General Appearance: NA  Eye Contact:  NA  Speech:  Clear and Coherent  Volume:  Normal  Mood:  Euthymic  Affect:  NA  Thought Process:  Goal Directed  Orientation:  Full (Time, Place, and Person)  Thought Content: WDL   Suicidal Thoughts:  No  Homicidal Thoughts:  No  Memory:  Immediate;   Good Recent;   Good Remote;   Fair  Judgement:  Good  Insight:  Fair  Psychomotor Activity:  Decreased  Concentration:  Concentration: Good and Attention Span: Good  Recall:  Good  Fund of Knowledge: Good  Language: Good  Akathisia:  No  Handed:  Right  AIMS (if indicated): not done  Assets:  Communication Skills Desire for Improvement Resilience Social Support  ADL's:  Intact  Cognition: WNL  Sleep:  Good   Screenings: PHQ2-9    Flowsheet Row Video Visit from 10/25/2021 in [facility] Video Visit from 07/25/2021 in [facility] Video Visit from 05/02/2021 in [facility] Video Visit from 03/01/2021 in [facility]  PHQ-2 Total Score 0 0 0 0      Flowsheet Row Video Visit from 10/25/2021 in [place] Admission (Discharged) from 09/11/2021 in [facility] Admission (Discharged) from 08/24/2021 in [facility] [place] No Risk No Risk No Risk        Assessment and Plan: This patient is a 70 year old female with a history of bipolar disorder.  She is continuing to lose weight and has had numerous GI studies.  Apparently she has been referred to someone at [facility]  Nevertheless her mood has been stable.  She will continue trazodone 150 mg at bedtime for sleep, Xanax 0.5 mg 4 times daily as needed for anxiety, gabapentin 600 mg twice daily for anxiety and fibromyalgia and Paxil 40 mg daily for depression.  She will return to see me in 3 months   Levonne Spiller, MD 10/25/2021, 2:17 PM

## 2021-10-26 ENCOUNTER — Ambulatory Visit (HOSPITAL_COMMUNITY): Payer: Medicare HMO | Admitting: Physical Therapy

## 2021-10-26 ENCOUNTER — Other Ambulatory Visit: Payer: Self-pay

## 2021-10-26 DIAGNOSIS — Z9181 History of falling: Secondary | ICD-10-CM

## 2021-10-26 DIAGNOSIS — M6281 Muscle weakness (generalized): Secondary | ICD-10-CM

## 2021-10-26 DIAGNOSIS — M545 Low back pain, unspecified: Secondary | ICD-10-CM | POA: Diagnosis not present

## 2021-10-26 DIAGNOSIS — R262 Difficulty in walking, not elsewhere classified: Secondary | ICD-10-CM

## 2021-10-26 NOTE — Therapy (Signed)
[facility] [address] [place], [place], [address] Phone: [phone]   Fax:  [phone]  Physical Therapy Treatment  Patient Details  Name: Katherine Middleton MRN: [id number] Date of Birth: February 17, 1951 Referring Provider (PT): Bing Matter Pa-C   Encounter Date: 10/26/2021   PT End of Session - 10/26/21 1651     Visit Number 8    Number of Visits 12    Date for PT Re-Evaluation 10/26/21    Authorization Type [employer] approved 12 visits 9/15-10/27    Authorization - Visit Number 8    Authorization - Number of Visits 12    Progress Note Due on Visit 10    PT Start Time 0160    PT Stop Time 1658    PT Time Calculation (min) 43 min    Equipment Utilized During Treatment Gait belt    Activity Tolerance Patient tolerated treatment well;Patient limited by fatigue    Behavior During Therapy [facility] for tasks assessed/performed             Past Medical History:  Diagnosis Date   Anxiety    Arthritis    Bipolar disorder ([place])    Depression    Fibromyalgia    GERD (gastroesophageal reflux disease) 12/31/2002   HLD (hyperlipidemia)    diet controlled - no meds   HSV infection    Liver mass 07/2021   Osteoarthritis    Prolonged Q-T interval on ECG 02/2021   Sinusitis 12/31/2012   Wears glasses     Past Surgical History:  Procedure Laterality Date   BIOPSY  08/24/2021   Procedure: BIOPSY;  Surgeon: Irving Copas., MD;  Location: [place];  Service: Gastroenterology;;   BIOPSY  09/11/2021   Procedure: BIOPSY;  Surgeon: Irving Copas., MD;  [facility];  Service: Gastroenterology;;   ESOPHAGOGASTRODUODENOSCOPY (EGD) WITH PROPOFOL N/A 08/11/2021   Procedure: ESOPHAGOGASTRODUODENOSCOPY (EGD) WITH PROPOFOL;  Surgeon: Carol Ada, MD;  [facility];  Service: Endoscopy;  Laterality: N/A;   ESOPHAGOGASTRODUODENOSCOPY (EGD) WITH PROPOFOL N/A 08/24/2021   Procedure: ESOPHAGOGASTRODUODENOSCOPY  (EGD) WITH PROPOFOL;  Surgeon: Rush Landmark Telford Nab., MD;  Location: [place];  Service: Gastroenterology;  Laterality: N/A;   ESOPHAGOGASTRODUODENOSCOPY (EGD) WITH PROPOFOL N/A 09/11/2021   Procedure: ESOPHAGOGASTRODUODENOSCOPY (EGD) WITH PROPOFOL;  Surgeon: Rush Landmark Telford Nab., MD;  [facility];  Service: Gastroenterology;  Laterality: N/A;   EUS N/A 08/24/2021   Procedure: UPPER ENDOSCOPIC ULTRASOUND (EUS) LINEAR;  Surgeon: Irving Copas., MD;  Location: [place];  Service: Gastroenterology;  Laterality: N/A;   EUS N/A 09/11/2021   Procedure: UPPER ENDOSCOPIC ULTRASOUND (EUS) LINEAR;  Surgeon: Irving Copas., MD;  [facility];  Service: Gastroenterology;  Laterality: N/A;   FINE NEEDLE ASPIRATION  08/24/2021   Procedure: FINE NEEDLE ASPIRATION;  Surgeon: Rush Landmark Telford Nab., MD;  Location: [place];  Service: Gastroenterology;;   FINE NEEDLE ASPIRATION  09/11/2021   Procedure: FINE NEEDLE ASPIRATION (FNA) LINEAR;  Surgeon: Irving Copas., MD;  [facility];  Service: Gastroenterology;;  Delaine Lame. not in system to charge   TONSILLECTOMY  04/08/57   some date around then   TUBAL LIGATION  10/21/1984   UPPER ESOPHAGEAL ENDOSCOPIC ULTRASOUND (EUS) N/A 08/11/2021   Procedure: UPPER ESOPHAGEAL ENDOSCOPIC ULTRASOUND (EUS);  Surgeon: Carol Ada, MD;  Location: [facility];  Service: Endoscopy;  Laterality: N/A;    There were no vitals filed for this visit.   Subjective Assessment - 10/26/21 1619     Subjective  pt states she is still sore from Tuesdays workout.  Reports pain in her back of 4/10 and most sorenes is in her hip and legs.    Currently in Pain? Yes    Pain Score 4     Pain Location Back    Pain Orientation Lower                               [facility] Adult PT Treatment/Exercise - 10/26/21 0001       Lumbar Exercises: Standing   Other Standing Lumbar Exercises side stepping no  UE's 5RT, high marching no UE's 2X15    Other Standing Lumbar Exercises static standing on airex pad 3x15 sec eyes open/closed, 15 seconds on airex with head movements up/down, lateral movements Tandem stance 3x15 sec left/right      Lumbar Exercises: Seated   Long Arc Quad on Chair Strengthening;Both;3 sets;10 reps    LAQ on Chair Weights (lbs) 3#      Knee/Hip Exercises: Supine   Short Arc Quad Sets Strengthening;Both;3 sets;10 reps    Short Arc Quad Sets Limitations 3#    Bridges Both;10 reps;3 sets                       PT Short Term Goals - 09/18/21 1358       PT SHORT TERM GOAL #1   Title Patient will be independent with initial HEP and self-management strategies to improve functional outcomes    Time 3    Period Weeks    Status On-going    Target Date 10/05/21               PT Long Term Goals - 09/18/21 1359       PT LONG TERM GOAL #1   Title Patient will be independent with advance HEP and self-management strategies to improve functional outcomes    Time 6    Period Weeks    Status On-going      PT LONG TERM GOAL #2   Title Patient will improve FOTO score to predicted value to indicate improvement in functional outcomes    Time 6    Period Weeks    Status On-going      PT LONG TERM GOAL #3   Title Patient will be able to ambulate at least 275 feet during 2MWT with LRAD to demonstrate improved ability to perform functional mobility and associated tasks.    Time 6    Period Weeks    Status On-going      PT LONG TERM GOAL #4   Title Patient will have equal to or > 4+/5 MMT throughout BLE to improve ability to perform functional mobility, stair ambulation and ADLs.    Time 6    Period Weeks    Status On-going                   Plan - 10/26/21 1647     Clinical Impression Statement Pt with soreness today from last session.  Continued focus on LE strengthening and with static standing balance maneuvers.  Pt with ability to maintain  full 15 second balance with and without eyes closes and with improved ability to elicit righting reactions with narrow BOS.  Added head movements (lateral and up/down)  to challenge balance with eyes open with continued ability to maintain. Up/down movements more challenging than lateral ones.   Pt will continue to  benefit from skilled PT for increasing BLE strength and dynamic balance to reduce risk for falls    Personal Factors and Comorbidities Past/Current Experience;Comorbidity 3+    Comorbidities fibromyalgia, frequent falls, osteoporosis, arthritis    Examination-Activity Limitations Locomotion Level;Bed Mobility;Bend;Transfers;Lift;Bathing;Stand;Stairs;Squat;Dressing    Examination-Participation Restrictions Cleaning;Laundry;Shop;Community Activity;Yard Work    Stability/Clinical Decision Making Stable/Uncomplicated    Rehab Potential Good    PT Frequency 2x / week    PT Duration 6 weeks    PT Treatment/Interventions Patient/family education;Gait training;Stair training;Functional mobility training;Therapeutic activities;Therapeutic exercise;Balance training;Manual techniques;ADLs/Self Care Home Management;Biofeedback;Cryotherapy;Fluidtherapy;Parrafin;Ultrasound;Traction;Moist Heat;Iontophoresis 4mg /ml Dexamethasone;DME Instruction;Vestibular;Visual/perceptual remediation/compensation;Passive range of motion;Dry needling;Neuromuscular re-education;Energy conservation;Spinal Manipulations;Manual lymph drainage;Splinting;Joint Manipulations;Orthotic Fit/Training;Compression bandaging;Taping;Vasopneumatic Device;Electrical Stimulation;Contrast Bath;Canalith Repostioning    PT Next Visit Plan f/u with vertigo symptoms as indicated. Progress core and glute strengthening as able.  Next session progress hip strenghening, begin dynamic balance activities and sit to stands.  Update HEP    PT Home Exercise Plan Eval: SLR, pelvic tilt, heel raise 9/28 standing hip abd; 10/18 lumbar stretches    Consulted  and Agree with Plan of Care Patient             Patient will benefit from skilled therapeutic intervention in order to improve the following deficits and impairments:  Pain, Abnormal gait, Decreased activity tolerance, Decreased balance, Decreased strength, Difficulty walking, Improper body mechanics, Decreased mobility, Decreased endurance, Decreased range of motion, Hypomobility, Impaired flexibility  Visit Diagnosis: Low back pain, unspecified back pain laterality, unspecified chronicity, unspecified whether sciatica present  History of falling  Muscle weakness (generalized)  Difficulty in walking, not elsewhere classified     Problem List Patient Active Problem List   Diagnosis Date Noted   Back pain, chronic 04/03/2013   Anxiety 12/17/2012   Vitamin D insufficiency 12/17/2012   GERD (gastroesophageal reflux disease) 11/18/2012   Bipolar 1 disorder ([place]) 11/18/2012   Fibromyalgia 11/18/2012   Constanza Mincy Sula Soda, PTA/CLT, [facility] [phone]  Teena Irani, PTA 10/26/2021, 5:04 PM  [place], [place], [address] Phone: [phone]   Fax:  [phone]  Name: Barbarajean Kinzler MRN: [id number] Date of Birth: Jan 04, 1951

## 2021-10-31 ENCOUNTER — Other Ambulatory Visit: Payer: Self-pay

## 2021-10-31 ENCOUNTER — Ambulatory Visit (HOSPITAL_COMMUNITY): Payer: Medicare HMO | Attending: Family Medicine

## 2021-10-31 DIAGNOSIS — M6281 Muscle weakness (generalized): Secondary | ICD-10-CM | POA: Insufficient documentation

## 2021-10-31 DIAGNOSIS — M545 Low back pain, unspecified: Secondary | ICD-10-CM | POA: Insufficient documentation

## 2021-10-31 DIAGNOSIS — R262 Difficulty in walking, not elsewhere classified: Secondary | ICD-10-CM | POA: Diagnosis present

## 2021-10-31 DIAGNOSIS — Z9181 History of falling: Secondary | ICD-10-CM | POA: Diagnosis present

## 2021-10-31 NOTE — Therapy (Addendum)
[facility] [address] [place], [place], [address] Phone: [phone]   Fax:  [phone]  Physical Therapy Treatment and Recertification and Discharge Note  Patient Details  Name: Katherine Middleton MRN: [id number] Date of Birth: 1951-12-30 Referring Provider (PT): Bing Matter Pa-C  PHYSICAL THERAPY DISCHARGE SUMMARY  Visits from Start of Care: 9  Current functional level related to goals / functional outcomes: Unable to assess due to unplanned discharge   Remaining deficits: Unable to assess due to unplanned discharge   Education / Equipment: Unable to assess due to unplanned discharge   Patient agrees to discharge. Patient goals were partially met. Patient is being discharged due to not returning since the last visit.  12:41 PM, 12/19/21 Katherine Middleton, DPT Physical Therapy with [facility]  [phone] office    Encounter Date: 10/31/2021   PT End of Session - 10/31/21 1610     Visit Number 9    Number of Visits 20    Date for PT Re-Evaluation 11/28/21    Authorization Type [employer]Here approved 12 visits 9/15-10/27    Authorization - Visit Number 9    Authorization - Number of Visits 12    Progress Note Due on Visit 10    PT Start Time 1600    PT Stop Time 1645    PT Time Calculation (min) 45 min    Equipment Utilized During Treatment Gait belt    Activity Tolerance Patient tolerated treatment well;Patient limited by fatigue    Behavior During Therapy [facility] for tasks assessed/performed             Past Medical History:  Diagnosis Date   Anxiety    Arthritis    Bipolar disorder ([place])    Depression    Fibromyalgia    GERD (gastroesophageal reflux disease) 12/31/2002   HLD (hyperlipidemia)    diet controlled - no meds   HSV infection    Liver mass 07/2021   Osteoarthritis    Prolonged Q-T interval on ECG 02/2021   Sinusitis 12/31/2012   Wears glasses     Past Surgical History:   Procedure Laterality Date   BIOPSY  08/24/2021   Procedure: BIOPSY;  Surgeon: Irving Copas., MD;  Location: [place];  Service: Gastroenterology;;   BIOPSY  09/11/2021   Procedure: BIOPSY;  Surgeon: Irving Copas., MD;  [facility];  Service: Gastroenterology;;   ESOPHAGOGASTRODUODENOSCOPY (EGD) WITH PROPOFOL N/A 08/11/2021   Procedure: ESOPHAGOGASTRODUODENOSCOPY (EGD) WITH PROPOFOL;  Surgeon: Carol Ada, MD;  [facility];  Service: Endoscopy;  Laterality: N/A;   ESOPHAGOGASTRODUODENOSCOPY (EGD) WITH PROPOFOL N/A 08/24/2021   Procedure: ESOPHAGOGASTRODUODENOSCOPY (EGD) WITH PROPOFOL;  Surgeon: Rush Landmark Telford Nab., MD;  Location: [place];  Service: Gastroenterology;  Laterality: N/A;   ESOPHAGOGASTRODUODENOSCOPY (EGD) WITH PROPOFOL N/A 09/11/2021   Procedure: ESOPHAGOGASTRODUODENOSCOPY (EGD) WITH PROPOFOL;  Surgeon: Rush Landmark Telford Nab., MD;  [facility];  Service: Gastroenterology;  Laterality: N/A;   EUS N/A 08/24/2021   Procedure: UPPER ENDOSCOPIC ULTRASOUND (EUS) LINEAR;  Surgeon: Irving Copas., MD;  Location: [place];  Service: Gastroenterology;  Laterality: N/A;   EUS N/A 09/11/2021   Procedure: UPPER ENDOSCOPIC ULTRASOUND (EUS) LINEAR;  Surgeon: Irving Copas., MD;  [facility];  Service: Gastroenterology;  Laterality: N/A;   FINE NEEDLE ASPIRATION  08/24/2021   Procedure: FINE NEEDLE ASPIRATION;  Surgeon: Rush Landmark Telford Nab., MD;  Location: [place];  Service: Gastroenterology;;   FINE NEEDLE ASPIRATION  09/11/2021   Procedure:  FINE NEEDLE ASPIRATION (FNA) LINEAR;  Surgeon: Rush Landmark Telford Nab., MD;  [facility];  Service: Gastroenterology;;  Delaine Lame. not in system to charge   TONSILLECTOMY  04/08/57   some date around then   TUBAL LIGATION  10/21/1984   UPPER ESOPHAGEAL ENDOSCOPIC ULTRASOUND (EUS) N/A 08/11/2021   Procedure: UPPER ESOPHAGEAL ENDOSCOPIC ULTRASOUND  (EUS);  Surgeon: Carol Ada, MD;  [facility];  Service: Endoscopy;  Laterality: N/A;    There were no vitals filed for this visit.   Subjective Assessment - 10/31/21 1607     Subjective Some soreness in low back a little sore from the last session.  pt reports her low back feels about the same since [facility]. Pt notes she has been having other health difficulties and unexplained weight loss which has negatively affected her and accompanied by general fatigue    Pertinent History fibromyalgia, bipolar, arthritis, osteoporosis    Currently in Pain? Yes    Pain Score 4     Pain Location Back    Pain Orientation Lower    Pain Descriptors / Indicators Sore    Pain Type Chronic pain                [facility] PT Assessment - 10/31/21 0001       Assessment   Medical Diagnosis LBP/ LE weakness    Referring Provider (PT) Bing Matter Pa-C      Observation/Other Assessments   Focus on Therapeutic Outcomes (FOTO)  43.6% function      Transfers   Comments 30 sec chair rise test: 4 reps                           [facility] Adult PT Treatment/Exercise - 10/31/21 0001       Ambulation/Gait   Ambulation/Gait Yes    Ambulation/Gait Assistance 6: Modified independent (Device/Increase time)    Ambulation Distance (Feet) 185 Feet    Assistive device Small based quad cane    Gait Pattern Decreased step length - right;Decreased step length - left;Decreased stride length    Ambulation Surface Level;Indoor      Neuro Re-ed    Neuro Re-ed Details  Dix-Hallpike performed and demo left upbeat nystagmus with head rotated to left. Dizziness period of 35 seconds. Epley maneuver performed with initial position of left head/neck rotation left, then proceeding to right rotation, and right log roll.      Lumbar Exercises: Seated   Long Arc Quad on Chair Strengthening;Both;3 sets;10 reps    LAQ on Chair Weights (lbs) 3#                       PT Short Term Goals -  10/31/21 1628       PT SHORT TERM GOAL #1   Title Patient will be independent with initial HEP and self-management strategies to improve functional outcomes    Baseline In development    Time 3    Period Weeks    Status On-going    Target Date 11/14/21      PT SHORT TERM GOAL #2   Title Pt to increase in core and LE strength as evident by being able to come sit to stand 8 x in 30 seconds    Baseline 4 in 30 seconds    Time 2    Period Weeks    Status New    Target Date 11/14/21  PT Long Term Goals - 10/31/21 1609       PT LONG TERM GOAL #1   Title Patient will be independent with advance HEP and self-management strategies to improve functional outcomes    Time 6    Period Weeks    Status On-going    Target Date 11/28/21      PT LONG TERM GOAL #2   Title Patient will improve FOTO score to predicted value to indicate improvement in functional outcomes    Baseline 43% function    Time 6    Period Weeks    Status On-going    Target Date 11/28/21      PT LONG TERM GOAL #3   Title Patient will be able to ambulate at least 275 feet during 2MWT with LRAD to demonstrate improved ability to perform functional mobility and associated tasks.    Baseline 185 ft with NBQC    Time 6    Period Weeks    Status On-going    Target Date 11/28/21      PT LONG TERM GOAL #4   Title Patient will have equal to or > 4+/5 MMT throughout BLE to improve ability to perform functional mobility, stair ambulation and ADLs.    Baseline 4/5 gross strength    Time 6    Period Weeks    Status On-going    Target Date 11/28/21                   Plan - 10/31/21 1657     Clinical Impression Statement Continues to exhibit generalized weakness and reduced functional activity tolerance with decreased gait speed and risk for falls per 30 second chair rise test.  Pt would benefit from continued PT services to improve strength, dynamic balance, and reduce back pain to improve  activity level    Personal Factors and Comorbidities Past/Current Experience;Comorbidity 3+    Comorbidities fibromyalgia, frequent falls, osteoporosis, arthritis    Examination-Activity Limitations Locomotion Level;Bed Mobility;Bend;Transfers;Lift;Bathing;Stand;Stairs;Squat;Dressing    Examination-Participation Restrictions Cleaning;Laundry;Shop;Community Activity;Yard Work    Stability/Clinical Decision Making Stable/Uncomplicated    Rehab Potential Good    PT Frequency 2x / week    PT Duration 6 weeks    PT Treatment/Interventions Patient/family education;Gait training;Stair training;Functional mobility training;Therapeutic activities;Therapeutic exercise;Balance training;Manual techniques;ADLs/Self Care Home Management;Biofeedback;Cryotherapy;Fluidtherapy;Parrafin;Ultrasound;Traction;Moist Heat;Iontophoresis 52m/ml Dexamethasone;DME Instruction;Vestibular;Visual/perceptual remediation/compensation;Passive range of motion;Dry needling;Neuromuscular re-education;Energy conservation;Spinal Manipulations;Manual lymph drainage;Splinting;Joint Manipulations;Orthotic Fit/Training;Compression bandaging;Taping;Vasopneumatic Device;Electrical Stimulation;Contrast Bath;Canalith Repostioning    PT Next Visit Plan f/u with vertigo symptoms as indicated. Progress core and glute strengthening as able.  Next session progress hip strenghening, begin dynamic balance activities and sit to stands.  Update HEP    PT Home Exercise Plan Eval: SLR, pelvic tilt, heel raise 9/28 standing hip abd; 10/18 lumbar stretches    Consulted and Agree with Plan of Care Patient             Patient will benefit from skilled therapeutic intervention in order to improve the following deficits and impairments:  Pain, Abnormal gait, Decreased activity tolerance, Decreased balance, Decreased strength, Difficulty walking, Improper body mechanics, Decreased mobility, Decreased endurance, Decreased range of motion, Hypomobility, Impaired  flexibility  Visit Diagnosis: Low back pain, unspecified back pain laterality, unspecified chronicity, unspecified whether sciatica present  History of falling  Muscle weakness (generalized)  Difficulty in walking, not elsewhere classified     Problem List Patient Active Problem List   Diagnosis Date Noted   Back pain, chronic 04/03/2013   Anxiety 12/17/2012   Vitamin  D insufficiency 12/17/2012   GERD (gastroesophageal reflux disease) 11/18/2012   Bipolar 1 disorder ([place]) 11/18/2012   Fibromyalgia 11/18/2012    Katherine Middleton, PT 10/31/2021, 5:00 PM  [place], [place], [address] Phone: [phone]   Fax:  [phone]  Name: Katherine Middleton MRN: [id number] Date of Birth: 1951-12-11

## 2021-11-01 ENCOUNTER — Encounter: Payer: Medicare HMO | Admitting: Counselor

## 2021-11-02 ENCOUNTER — Ambulatory Visit (HOSPITAL_COMMUNITY): Payer: Medicare HMO | Admitting: Physical Therapy

## 2021-11-09 ENCOUNTER — Encounter: Payer: Medicare HMO | Admitting: Counselor

## 2021-11-16 ENCOUNTER — Encounter (HOSPITAL_COMMUNITY): Payer: Medicare HMO | Admitting: Physical Therapy

## 2021-12-11 ENCOUNTER — Telehealth (HOSPITAL_COMMUNITY): Payer: Self-pay

## 2021-12-11 NOTE — Telephone Encounter (Signed)
Called pt to ask if she plans to attend apt scheduled for tomorrow as she has not been in therapy since 10/31/21.  Requested pt contact MD and get new referral to return to therapy.    Ihor Austin, LPTA/CLT; Delana Meyer [phone]

## 2021-12-12 ENCOUNTER — Ambulatory Visit (HOSPITAL_COMMUNITY): Payer: Medicare Other

## 2021-12-14 ENCOUNTER — Ambulatory Visit (HOSPITAL_COMMUNITY): Payer: Medicare Other

## 2021-12-19 ENCOUNTER — Telehealth (HOSPITAL_COMMUNITY): Payer: Self-pay | Admitting: *Deleted

## 2021-12-19 NOTE — Telephone Encounter (Signed)
Patient called and LMOM needing to schedule f/u with provider.   Staff called patient back and was not able to reach her and [facility] for patient to call office back to schedule her f/u appt with provider.

## 2021-12-20 ENCOUNTER — Encounter: Payer: Medicare HMO | Admitting: Psychology

## 2022-01-02 ENCOUNTER — Encounter: Payer: Medicare HMO | Admitting: Psychology

## 2022-01-08 ENCOUNTER — Encounter (HOSPITAL_COMMUNITY): Payer: Self-pay | Admitting: Physical Therapy

## 2022-01-08 ENCOUNTER — Other Ambulatory Visit: Payer: Self-pay

## 2022-01-08 ENCOUNTER — Ambulatory Visit (HOSPITAL_COMMUNITY): Payer: Medicare Other | Attending: Family Medicine | Admitting: Physical Therapy

## 2022-01-08 DIAGNOSIS — Z9181 History of falling: Secondary | ICD-10-CM | POA: Insufficient documentation

## 2022-01-08 DIAGNOSIS — R2689 Other abnormalities of gait and mobility: Secondary | ICD-10-CM | POA: Diagnosis present

## 2022-01-08 DIAGNOSIS — M545 Low back pain, unspecified: Secondary | ICD-10-CM | POA: Diagnosis present

## 2022-01-08 DIAGNOSIS — M6281 Muscle weakness (generalized): Secondary | ICD-10-CM | POA: Insufficient documentation

## 2022-01-08 DIAGNOSIS — R262 Difficulty in walking, not elsewhere classified: Secondary | ICD-10-CM | POA: Diagnosis present

## 2022-01-08 NOTE — Therapy (Signed)
[facility] [place], [place], [address] Phone: [phone]   Fax:  [phone]  Physical Therapy Evaluation  Patient Details  Name: Katherine Middleton MRN: [id number] Date of Birth: [birth year] Referring Provider (PT): Bing Matter Pa-C   Encounter Date: 01/08/2022   PT End of Session - 01/08/22 1313     Visit Number 1    Number of Visits 8    Date for PT Re-Evaluation 02/05/22    Authorization Type [employer]    Authorization Time Period Check auth    Progress Note Due on Visit --    PT Start Time 1305    PT Stop Time 1340    PT Time Calculation (min) 35 min    Activity Tolerance Patient tolerated treatment well    Behavior During Therapy [facility] for tasks assessed/performed             Past Medical History:  Diagnosis Date   Anxiety    Arthritis    Bipolar disorder ([place])    Depression    Fibromyalgia    GERD (gastroesophageal reflux disease) 12/31/2002   HLD (hyperlipidemia)    diet controlled - no meds   HSV infection    Liver mass 07/2021   Osteoarthritis    Prolonged Q-T interval on ECG 02/2021   Sinusitis 12/31/2012   Wears glasses     Past Surgical History:  Procedure Laterality Date   BIOPSY  08/24/2021   Procedure: BIOPSY;  Surgeon: Irving Copas., MD;  Location: [place];  Service: Gastroenterology;;   BIOPSY  09/11/2021   Procedure: BIOPSY;  Surgeon: Irving Copas., MD;  [facility];  Service: Gastroenterology;;   ESOPHAGOGASTRODUODENOSCOPY (EGD) WITH PROPOFOL N/A 08/11/2021   Procedure: ESOPHAGOGASTRODUODENOSCOPY (EGD) WITH PROPOFOL;  Surgeon: Carol Ada, MD;  [facility];  Service: Endoscopy;  Laterality: N/A;   ESOPHAGOGASTRODUODENOSCOPY (EGD) WITH PROPOFOL N/A 08/24/2021   Procedure: ESOPHAGOGASTRODUODENOSCOPY (EGD) WITH PROPOFOL;  Surgeon: Rush Landmark Telford Nab., MD;  Location: [place];  Service: Gastroenterology;  Laterality: N/A;    ESOPHAGOGASTRODUODENOSCOPY (EGD) WITH PROPOFOL N/A 09/11/2021   Procedure: ESOPHAGOGASTRODUODENOSCOPY (EGD) WITH PROPOFOL;  Surgeon: Rush Landmark Telford Nab., MD;  [facility];  Service: Gastroenterology;  Laterality: N/A;   EUS N/A 08/24/2021   Procedure: UPPER ENDOSCOPIC ULTRASOUND (EUS) LINEAR;  Surgeon: Irving Copas., MD;  Location: [place];  Service: Gastroenterology;  Laterality: N/A;   EUS N/A 09/11/2021   Procedure: UPPER ENDOSCOPIC ULTRASOUND (EUS) LINEAR;  Surgeon: Irving Copas., MD;  [facility];  Service: Gastroenterology;  Laterality: N/A;   FINE NEEDLE ASPIRATION  08/24/2021   Procedure: FINE NEEDLE ASPIRATION;  Surgeon: Rush Landmark Telford Nab., MD;  Location: [place];  Service: Gastroenterology;;   FINE NEEDLE ASPIRATION  09/11/2021   Procedure: FINE NEEDLE ASPIRATION (FNA) LINEAR;  Surgeon: Irving Copas., MD;  [facility];  Service: Gastroenterology;;  Delaine Lame. not in system to charge   TONSILLECTOMY  04/08/57   some date around then   TUBAL LIGATION  10/21/1984   UPPER ESOPHAGEAL ENDOSCOPIC ULTRASOUND (EUS) N/A 08/11/2021   Procedure: UPPER ESOPHAGEAL ENDOSCOPIC ULTRASOUND (EUS);  Surgeon: Carol Ada, MD;  Location[facility];  Service: Endoscopy;  Laterality: N/A;    There were no vitals filed for this visit.    Subjective Assessment - 01/08/22 1311     Subjective Patient presents to therapy with complaint of LE weakness. This is causing her trouble with her balance and mobility. She is previously known  to this clinic and has been treated with good result. She attributes weakness in part to recent weight loss.    Pertinent History fibromyalgia, bipolar, arthritis, osteoporosis    Limitations Walking;House hold activities;Standing;Lifting    Patient Stated Goals Be able to walk without cane    Currently in Pain? Yes    Pain Score 5     Pain Location Hip    Pain Orientation Right;Lateral    Pain  Descriptors / Indicators Aching;Dull    Pain Type Chronic pain    Pain Onset More than a month ago    Pain Frequency Intermittent    Aggravating Factors  standing, walking    Pain Relieving Factors rest, meds    Effect of Pain on Daily Activities Limits                [facility] PT Assessment - 01/08/22 0001       Assessment   Medical Diagnosis LE weakness    Referring Provider (PT) Bing Matter Pa-C    Prior Therapy Yes      Precautions   Precautions Fall      Restrictions   Weight Bearing Restrictions No      Balance Screen   Has the patient fallen in the past 6 months No      [place] residence    Living Arrangements Alone      Prior Function   Level of Independence Independent      Cognition   Overall Cognitive Status Within Functional Limits for tasks assessed      Observation/Other Assessments   Focus on Therapeutic Outcomes (FOTO)  No FOTO available at Eval      Strength   Overall Strength Comments Tested in seated    Right Hip Flexion 4+/5    Right Hip ABduction 4+/5    Left Hip Flexion 4+/5    Left Hip ABduction 4+/5    Right Knee Extension 4/5    Left Knee Extension 4+/5    Right Ankle Dorsiflexion 4/5    Left Ankle Dorsiflexion 4/5      Transfers   Five time sit to stand comments  17 sec with no UEs      Ambulation/Gait   Ambulation/Gait Yes    Ambulation/Gait Assistance 6: Modified independent (Device/Increase time);5: Supervision    Ambulation Distance (Feet) 265 Feet    Assistive device Small based quad cane    Gait Pattern Decreased arm swing - right;Decreased stride length   uneven cadence   Ambulation Surface Level;Indoor    Gait Comments 2MWT      Balance   Balance Assessed Yes      Static Standing Balance   Static Standing Balance -  Activities  Tandam Stance - Right Leg;Tandam Stance - Left Leg;Single Leg Stance - Right Leg;Single Leg Stance - Left Leg    Static Standing - Comment/# of  Minutes tandam 30 seconds bilateral with mod sway, 8 seconds, 8 seconds with mod sway                        Objective measurements completed on examination: See above findings.       [facility] Adult PT Treatment/Exercise - 01/08/22 0001       Lumbar Exercises: Standing   Heel Raises 10 reps    Other Standing Lumbar Exercises standing hip abduction x10      Lumbar Exercises: Seated   Sit to Stand 5  reps                     PT Education - 01/08/22 1315     Education Details on evaluation findings, POC and HEP    Person(s) Educated Patient    Methods Explanation;Handout    Comprehension Verbalized understanding              PT Short Term Goals - 01/08/22 1336       PT SHORT TERM GOAL #1   Title Patient will be independent with initial HEP and self-management strategies to improve functional outcomes    Time 2    Period Weeks    Status New    Target Date 01/22/22               PT Long Term Goals - 01/08/22 1336       PT LONG TERM GOAL #1   Title Patient will be independent with advanced HEP and self-management strategies to improve functional outcomes    Time 4    Period Weeks    Status New    Target Date 02/05/22      PT LONG TERM GOAL #2   Title Patient will be able to ambulate at least 305 feet during 2MWT with LRAD to demonstrate improved ability to perform functional mobility and associated tasks.    Time 4    Period Weeks    Status New    Target Date 02/05/22      PT LONG TERM GOAL #3   Title Patient will be able to perform stand x 5 in < 13 seconds to demonstrate improvement in functional mobility and reduced risk for falls.    Time 4    Period Weeks    Status New    Target Date 02/05/22                    Plan - 01/08/22 1333     Clinical Impression Statement Patient is a 71 y.o. female who presents to physical therapy with complaint of LE weakness and balance difficulty. Patient demonstrates mildly  decreased strength, slight balance deficits and gait abnormalities which are negatively impacting patient ability to perform ADLs and functional mobility tasks. Patient will benefit from skilled physical therapy services to address these deficits to improve level of function with ADLs, functional mobility tasks, and reduce risk for falls.    Personal Factors and Comorbidities Past/Current Experience;Comorbidity 3+    Comorbidities fibromyalgia, frequent falls, osteoporosis, arthritis    Examination-Activity Limitations Locomotion Level;Bed Mobility;Bend;Transfers;Lift;Bathing;Stand;Stairs;Squat;Dressing    Examination-Participation Restrictions Cleaning;Laundry;Shop;Community Activity;Yard Work    Stability/Clinical Decision Making Stable/Uncomplicated    Designer, jewellery Low    Rehab Potential Good    PT Frequency 2x / week    PT Duration 4 weeks    PT Treatment/Interventions Patient/family education;Gait training;Stair training;Functional mobility training;Therapeutic activities;Therapeutic exercise;Balance training;Manual techniques;ADLs/Self Care Home Management;Biofeedback;Cryotherapy;Fluidtherapy;Parrafin;Ultrasound;Traction;Moist Heat;Iontophoresis 4mg /ml Dexamethasone;DME Instruction;Vestibular;Visual/perceptual remediation/compensation;Passive range of motion;Dry needling;Neuromuscular re-education;Energy conservation;Spinal Manipulations;Manual lymph drainage;Splinting;Joint Manipulations;Orthotic Fit/Training;Compression bandaging;Taping;Vasopneumatic Device;Electrical Stimulation;Contrast Bath;Canalith Repostioning    PT Next Visit Plan Review HEP exercises. Progress functional strengthening and balance exercise as tolerated. Static balance on complaint surface, progress to dynamic balance and gait.    PT Home Exercise Plan Eval: sit to stand, heel raise, hip abduction    Consulted and Agree with Plan of Care Patient             Patient will benefit from skilled therapeutic  intervention in order to improve  the following deficits and impairments:  Pain, Abnormal gait, Decreased activity tolerance, Decreased balance, Decreased strength, Difficulty walking, Decreased mobility, Decreased endurance, Hypomobility, Decreased coordination  Visit Diagnosis: Muscle weakness (generalized)  Other abnormalities of gait and mobility     Problem List Patient Active Problem List   Diagnosis Date Noted   Back pain, chronic 04/03/2013   Anxiety 12/17/2012   Vitamin D insufficiency 12/17/2012   GERD (gastroesophageal reflux disease) 11/18/2012   Bipolar 1 disorder ([place]) 11/18/2012   Fibromyalgia 11/18/2012   1:42 PM, 01/08/22 Josue Hector PT DPT  Physical Therapist with [facility]  [phone] [place], [place], [address] Phone: [phone]   Fax:  [phone]  Name: Katherine Middleton MRN: [id number] Date of Birth: January 18, 1951

## 2022-01-08 NOTE — Patient Instructions (Signed)
Access Code: [email or web address] URL: [email or web address] Date: 01/08/2022 Prepared by: Josue Hector  Exercises Sit to Stand Without Arm Support - 2-3 x daily - 7 x weekly - 1-2 sets - 10 reps Standing Heel Raise with Support - 2-3 x daily - 7 x weekly - 2 sets - 10 reps Standing Hip Abduction with Counter Support - 2-3 x daily - 7 x weekly - 2 sets - 10 reps

## 2022-01-11 ENCOUNTER — Other Ambulatory Visit: Payer: Self-pay

## 2022-01-11 ENCOUNTER — Ambulatory Visit (HOSPITAL_COMMUNITY): Payer: Medicare Other | Admitting: Physical Therapy

## 2022-01-11 DIAGNOSIS — M6281 Muscle weakness (generalized): Secondary | ICD-10-CM

## 2022-01-11 DIAGNOSIS — R2689 Other abnormalities of gait and mobility: Secondary | ICD-10-CM

## 2022-01-11 NOTE — Patient Instructions (Signed)
Access Code: [email or web address] URL: [email or web address] Date: 01/11/2022 Prepared by: Josue Hector  Exercises Standing Tandem Balance with Counter Support - 2-3 x daily - 7 x weekly - 1 sets - 3 reps - 30 second hold Side Stepping with Counter Support - 2-3 x daily - 7 x weekly - 1 sets - 10 reps

## 2022-01-11 NOTE — Therapy (Signed)
[facility] [place], [place], [address] Phone: [phone]   Fax:  [phone]  Physical Therapy Treatment  Patient Details  Name: Katherine Middleton MRN: [id number] Date of Birth: [birth year] Referring Provider (PT): Bing Matter Pa-C   Encounter Date: 01/11/2022   PT End of Session - 01/11/22 1638     Visit Number 2    Number of Visits 8    Date for PT Re-Evaluation 02/05/22    Authorization Type [employer]    Authorization Time Period pending    PT Start Time 2951    PT Stop Time 1716    PT Time Calculation (min) 39 min    Activity Tolerance Patient tolerated treatment well;Patient limited by fatigue    Behavior During Therapy [facility] for tasks assessed/performed             Past Medical History:  Diagnosis Date   Anxiety    Arthritis    Bipolar disorder ([place])    Depression    Fibromyalgia    GERD (gastroesophageal reflux disease) 12/31/2002   HLD (hyperlipidemia)    diet controlled - no meds   HSV infection    Liver mass 07/2021   Osteoarthritis    Prolonged Q-T interval on ECG 02/2021   Sinusitis 12/31/2012   Wears glasses     Past Surgical History:  Procedure Laterality Date   BIOPSY  08/24/2021   Procedure: BIOPSY;  Surgeon: Irving Copas., MD;  Location: [place];  Service: Gastroenterology;;   BIOPSY  09/11/2021   Procedure: BIOPSY;  Surgeon: Irving Copas., MD;  [facility];  Service: Gastroenterology;;   ESOPHAGOGASTRODUODENOSCOPY (EGD) WITH PROPOFOL N/A 08/11/2021   Procedure: ESOPHAGOGASTRODUODENOSCOPY (EGD) WITH PROPOFOL;  Surgeon: Carol Ada, MD;  [facility];  Service: Endoscopy;  Laterality: N/A;   ESOPHAGOGASTRODUODENOSCOPY (EGD) WITH PROPOFOL N/A 08/24/2021   Procedure: ESOPHAGOGASTRODUODENOSCOPY (EGD) WITH PROPOFOL;  Surgeon: Rush Landmark Telford Nab., MD;  Location: [place];  Service: Gastroenterology;  Laterality: N/A;    ESOPHAGOGASTRODUODENOSCOPY (EGD) WITH PROPOFOL N/A 09/11/2021   Procedure: ESOPHAGOGASTRODUODENOSCOPY (EGD) WITH PROPOFOL;  Surgeon: Rush Landmark Telford Nab., MD;  [facility];  Service: Gastroenterology;  Laterality: N/A;   EUS N/A 08/24/2021   Procedure: UPPER ENDOSCOPIC ULTRASOUND (EUS) LINEAR;  Surgeon: Irving Copas., MD;  Location: [place];  Service: Gastroenterology;  Laterality: N/A;   EUS N/A 09/11/2021   Procedure: UPPER ENDOSCOPIC ULTRASOUND (EUS) LINEAR;  Surgeon: Irving Copas., MD;  [facility];  Service: Gastroenterology;  Laterality: N/A;   FINE NEEDLE ASPIRATION  08/24/2021   Procedure: FINE NEEDLE ASPIRATION;  Surgeon: Rush Landmark Telford Nab., MD;  Location: [place];  Service: Gastroenterology;;   FINE NEEDLE ASPIRATION  09/11/2021   Procedure: FINE NEEDLE ASPIRATION (FNA) LINEAR;  Surgeon: Irving Copas., MD;  [facility];  Service: Gastroenterology;;  Delaine Lame. not in system to charge   TONSILLECTOMY  04/08/57   some date around then   TUBAL LIGATION  10/21/1984   UPPER ESOPHAGEAL ENDOSCOPIC ULTRASOUND (EUS) N/A 08/11/2021   Procedure: UPPER ESOPHAGEAL ENDOSCOPIC ULTRASOUND (EUS);  Surgeon: Carol Ada, MD;  Location[facility];  Service: Endoscopy;  Laterality: N/A;    There were no vitals filed for this visit.   Subjective Assessment - 01/11/22 1639     Subjective Doing well today. No new complaints. Has been busy since Eval so has not done HEP yet.    Pertinent History fibromyalgia, bipolar, arthritis, osteoporosis    Limitations Walking;House  hold activities;Standing;Lifting    Patient Stated Goals Be able to walk without cane    Currently in Pain? No/denies    Pain Onset More than a month ago                               [facility] Adult PT Treatment/Exercise - 01/11/22 0001       Lumbar Exercises: Standing   Heel Raises 20 reps    Heel Raises Limitations TR x 20     Other Standing Lumbar Exercises tandem stance 3 x 20", NBOS with head turns x20, NBOS with head turns on foam 3 x 10    Other Standing Lumbar Exercises step taps x 30 on 4 inch box, sidestepping in bars 5RT, retrowalking on blue line 5 RT (cues for increased seepd)      Lumbar Exercises: Seated   Sit to Stand 20 reps      Knee/Hip Exercises: Standing   Hip Abduction 2 sets;10 reps    Forward Step Up 2 sets;10 reps;Hand Hold: 1;Step Height: 4"    Rocker Board 3 minutes   PF/DF                      PT Short Term Goals - 01/08/22 1336       PT SHORT TERM GOAL #1   Title Patient will be independent with initial HEP and self-management strategies to improve functional outcomes    Time 2    Period Weeks    Status New    Target Date 01/22/22               PT Long Term Goals - 01/08/22 1336       PT LONG TERM GOAL #1   Title Patient will be independent with advanced HEP and self-management strategies to improve functional outcomes    Time 4    Period Weeks    Status New    Target Date 02/05/22      PT LONG TERM GOAL #2   Title Patient will be able to ambulate at least 305 feet during 2MWT with LRAD to demonstrate improved ability to perform functional mobility and associated tasks.    Time 4    Period Weeks    Status New    Target Date 02/05/22      PT LONG TERM GOAL #3   Title Patient will be able to perform stand x 5 in < 13 seconds to demonstrate improvement in functional mobility and reduced risk for falls.    Time 4    Period Weeks    Status New    Target Date 02/05/22                   Plan - 01/11/22 1711     Clinical Impression Statement Initiated ther ex. Reviewed HEP and progressed LE strengthening and balance. Patient did well with static balance on solid floor, mild challenge with added foam. Patient most challenged with dynamic balance during retro walking. Required cues for consistent gait speed and step length, demos mild LOB  during turns but able to self-correct. Patient will continue to benefit from skilled therapy services to reduce deficits and improve functional ability.    Personal Factors and Comorbidities Past/Current Experience;Comorbidity 3+    Comorbidities fibromyalgia, frequent falls, osteoporosis, arthritis    Examination-Activity Limitations Locomotion Level;Bed Mobility;Bend;Transfers;Lift;Bathing;Stand;Stairs;Squat;Dressing    Examination-Participation Restrictions Cleaning;Laundry;Shop;Community Activity;[place] Work  Stability/Clinical Decision Making Stable/Uncomplicated    Rehab Potential Good    PT Frequency 2x / week    PT Duration 4 weeks    PT Treatment/Interventions Patient/family education;Gait training;Stair training;Functional mobility training;Therapeutic activities;Therapeutic exercise;Balance training;Manual techniques;ADLs/Self Care Home Management;Biofeedback;Cryotherapy;Fluidtherapy;Parrafin;Ultrasound;Traction;Moist Heat;Iontophoresis 4mg /ml Dexamethasone;DME Instruction;Vestibular;Visual/perceptual remediation/compensation;Passive range of motion;Dry needling;Neuromuscular re-education;Energy conservation;Spinal Manipulations;Manual lymph drainage;Splinting;Joint Manipulations;Orthotic Fit/Training;Compression bandaging;Taping;Vasopneumatic Device;Electrical Stimulation;Contrast Bath;Canalith Repostioning    PT Next Visit Plan Progress functional strengthening and balance exercise as tolerated. Static balance on complaint surface, progress to dynamic balance and gait.    PT Home Exercise Plan Eval: sit to stand, heel raise, hip abduction 1/12 tandem stance, sidestepping    Consulted and Agree with Plan of Care Patient             Patient will benefit from skilled therapeutic intervention in order to improve the following deficits and impairments:  Pain, Abnormal gait, Decreased activity tolerance, Decreased balance, Decreased strength, Difficulty walking, Decreased mobility,  Decreased endurance, Hypomobility, Decreased coordination  Visit Diagnosis: Muscle weakness (generalized)  Other abnormalities of gait and mobility     Problem List Patient Active Problem List   Diagnosis Date Noted   Back pain, chronic 04/03/2013   Anxiety 12/17/2012   Vitamin D insufficiency 12/17/2012   GERD (gastroesophageal reflux disease) 11/18/2012   Bipolar 1 disorder ([place]) 11/18/2012   Fibromyalgia 11/18/2012   5:21 PM, 01/11/22 Josue Hector PT DPT  Physical Therapist with [facility]  [phone], [place], [address] Phone: [phone]   Fax:  [phone]  Name: Katherine Middleton MRN: [id number] Date of Birth: 09-02-1951

## 2022-01-16 ENCOUNTER — Telehealth (HOSPITAL_COMMUNITY): Payer: Self-pay

## 2022-01-16 ENCOUNTER — Encounter (HOSPITAL_COMMUNITY): Payer: Medicare Other

## 2022-01-16 NOTE — Telephone Encounter (Signed)
they are installing her Heat/Air unit and they will not be done in time for her to get here today

## 2022-01-18 ENCOUNTER — Ambulatory Visit (HOSPITAL_COMMUNITY): Payer: Medicare Other

## 2022-01-18 ENCOUNTER — Encounter (HOSPITAL_COMMUNITY): Payer: Self-pay

## 2022-01-18 ENCOUNTER — Other Ambulatory Visit: Payer: Self-pay

## 2022-01-18 DIAGNOSIS — M6281 Muscle weakness (generalized): Secondary | ICD-10-CM

## 2022-01-18 DIAGNOSIS — R2689 Other abnormalities of gait and mobility: Secondary | ICD-10-CM

## 2022-01-18 NOTE — Therapy (Signed)
[place], [place], [address] Phone: [phone]   Fax:  [phone]  Physical Therapy Treatment  Patient Details  Name: Katherine Middleton MRN: [id number] Date of Birth: 08-24-1951 Referring Provider (PT): Bing Matter Pa-C   Encounter Date: 01/18/2022   PT End of Session - 01/18/22 1532     Visit Number 3    Number of Visits 8    Date for PT Re-Evaluation 02/05/22    Authorization Type UHC    Authorization Time Period No auth required, no visit limit    PT Start Time 1525    PT Stop Time 1607    PT Time Calculation (min) 42 min    Activity Tolerance Patient tolerated treatment well;Patient limited by fatigue    Behavior During Therapy [facility] for tasks assessed/performed             Past Medical History:  Diagnosis Date   Anxiety    Arthritis    Bipolar disorder ([place])    Depression    Fibromyalgia    GERD (gastroesophageal reflux disease) 12/31/2002   HLD (hyperlipidemia)    diet controlled - no meds   HSV infection    Liver mass 07/2021   Osteoarthritis    Prolonged Q-T interval on ECG 02/2021   Sinusitis 12/31/2012   Wears glasses     Past Surgical History:  Procedure Laterality Date   BIOPSY  08/24/2021   Procedure: BIOPSY;  Surgeon: Irving Copas., MD;  Location: [place];  Service: Gastroenterology;;   BIOPSY  09/11/2021   Procedure: BIOPSY;  Surgeon: Irving Copas., MD;  [facility]: [facility];  Service: Gastroenterology;;   ESOPHAGOGASTRODUODENOSCOPY (EGD) WITH PROPOFOL N/A 08/11/2021   Procedure: ESOPHAGOGASTRODUODENOSCOPY (EGD) WITH PROPOFOL;  Surgeon: Carol Ada, MD;  Location: [facility];  Service: Endoscopy;  Laterality: N/A;   ESOPHAGOGASTRODUODENOSCOPY (EGD) WITH PROPOFOL N/A 08/24/2021   Procedure: ESOPHAGOGASTRODUODENOSCOPY (EGD) WITH PROPOFOL;  Surgeon: Rush Landmark Telford Nab., MD;  Location: [place];  Service: Gastroenterology;  Laterality: N/A;    ESOPHAGOGASTRODUODENOSCOPY (EGD) WITH PROPOFOL N/A 09/11/2021   Procedure: ESOPHAGOGASTRODUODENOSCOPY (EGD) WITH PROPOFOL;  Surgeon: Rush Landmark Telford Nab., MD;  [facility];  Service: Gastroenterology;  Laterality: N/A;   EUS N/A 08/24/2021   Procedure: UPPER ENDOSCOPIC ULTRASOUND (EUS) LINEAR;  Surgeon: Irving Copas., MD;  Location: [place];  Service: Gastroenterology;  Laterality: N/A;   EUS N/A 09/11/2021   Procedure: UPPER ENDOSCOPIC ULTRASOUND (EUS) LINEAR;  Surgeon: Irving Copas., MD;  [facility];  Service: Gastroenterology;  Laterality: N/A;   FINE NEEDLE ASPIRATION  08/24/2021   Procedure: FINE NEEDLE ASPIRATION;  Surgeon: Rush Landmark Telford Nab., MD;  Location: [place];  Service: Gastroenterology;;   FINE NEEDLE ASPIRATION  09/11/2021   Procedure: FINE NEEDLE ASPIRATION (FNA) LINEAR;  Surgeon: Irving Copas., MD;  [facility];  Service: Gastroenterology;;  Delaine Lame. not in system to charge   TONSILLECTOMY  04/08/57   some date around then   TUBAL LIGATION  10/21/1984   UPPER ESOPHAGEAL ENDOSCOPIC ULTRASOUND (EUS) N/A 08/11/2021   Procedure: UPPER ESOPHAGEAL ENDOSCOPIC ULTRASOUND (EUS);  Surgeon: Carol Ada, MD;  [facility];  Service: Endoscopy;  Laterality: N/A;    There were no vitals filed for this visit.   Subjective Assessment - 01/18/22 1527     Subjective Pt stated she has been a week wihtout heat, finally got heat back.  Reports she is doing good overall.  Does c/o LBP pain scale 5/10  sore achey pain.    Pertinent History fibromyalgia, bipolar, arthritis, osteoporosis    Currently in Pain? Yes    Pain Score 5     Pain Location Back    Pain Orientation Lower    Pain Descriptors / Indicators Aching;Dull    Pain Type Chronic pain    Pain Onset More than a month ago    Pain Frequency Intermittent    Aggravating Factors  standing, walking    Pain Relieving Factors rest, meds    Effect of  Pain on Daily Activities limits                [facility] PT Assessment - 01/18/22 0001       Strength   Right Hip Extension 3/5   prone   Right Hip ABduction 4+/5   sidelying MMT   Left Hip Extension 3+/5   MMT in prone   Left Hip ABduction 4/5                           [facility] Adult PT Treatment/Exercise - 01/18/22 0001       Lumbar Exercises: Machines for Strengthening   Other Lumbar Machine Exercise Body craft retro/forward walkind as well as sidestep both directions 3RT each 1 Pl      Lumbar Exercises: Standing   Functional Squats 10 reps    Other Standing Lumbar Exercises tandem stance on foam 4x 20" intermittent HHA    Other Standing Lumbar Exercises SLS 10" max; vector stance 3x 5" no HHA      Lumbar Exercises: Seated   Sit to Stand 10 reps   eccentric control 19in height     Lumbar Exercises: Supine   Bridge 10 reps;5 seconds    Bridge Limitations 2 sets                       PT Short Term Goals - 01/08/22 1336       PT SHORT TERM GOAL #1   Title Patient will be independent with initial HEP and self-management strategies to improve functional outcomes    Time 2    Period Weeks    Status New    Target Date 01/22/22               PT Long Term Goals - 01/08/22 1336       PT LONG TERM GOAL #1   Title Patient will be independent with advanced HEP and self-management strategies to improve functional outcomes    Time 4    Period Weeks    Status New    Target Date 02/05/22      PT LONG TERM GOAL #2   Title Patient will be able to ambulate at least 305 feet during 2MWT with LRAD to demonstrate improved ability to perform functional mobility and associated tasks.    Time 4    Period Weeks    Status New    Target Date 02/05/22      PT LONG TERM GOAL #3   Title Patient will be able to perform stand x 5 in < 13 seconds to demonstrate improvement in functional mobility and reduced risk for falls.    Time 4    Period Weeks     Status New    Target Date 02/05/22                   Plan - 01/18/22 1628  Clinical Impression Statement Added gluteal strengthening exercises and progressed balance.  Pt able to complete all exercises with good form and no reports of pain.  Was limited by fatigue required periodic seated rest breaks.  Began retro and sidestepping with bodycraft machine for strengthening as well as stability control with 1PL.  Most difficulty with static balance on dynamic surfaces, required intermittent HHA.  Added bridges, squats, SLS and vector stance to HEP.    Personal Factors and Comorbidities Past/Current Experience;Comorbidity 3+    Comorbidities fibromyalgia, frequent falls, osteoporosis, arthritis    Examination-Activity Limitations Locomotion Level;Bed Mobility;Bend;Transfers;Lift;Bathing;Stand;Stairs;Squat;Dressing    Examination-Participation Restrictions Cleaning;Laundry;Shop;Community Activity;Yard Work    Stability/Clinical Decision Making Stable/Uncomplicated    Designer, jewellery Low    Rehab Potential Good    PT Frequency 2x / week    PT Duration 4 weeks    PT Treatment/Interventions Patient/family education;Gait training;Stair training;Functional mobility training;Therapeutic activities;Therapeutic exercise;Balance training;Manual techniques;ADLs/Self Care Home Management;Biofeedback;Cryotherapy;Fluidtherapy;Parrafin;Ultrasound;Traction;Moist Heat;Iontophoresis 4mg /ml Dexamethasone;DME Instruction;Vestibular;Visual/perceptual remediation/compensation;Passive range of motion;Dry needling;Neuromuscular re-education;Energy conservation;Spinal Manipulations;Manual lymph drainage;Splinting;Joint Manipulations;Orthotic Fit/Training;Compression bandaging;Taping;Vasopneumatic Device;Electrical Stimulation;Contrast Bath;Canalith Repostioning    PT Next Visit Plan Progress functional strengthening and balance exercise as tolerated. Static balance on complaint surface, progress to  dynamic balance and gait.    PT Home Exercise Plan Eval: sit to stand, heel raise, hip abduction 1/12 tandem stance, sidestepping; 1/19: bridge, squat, SLS, tandem stance    Consulted and Agree with Plan of Care Patient             Patient will benefit from skilled therapeutic intervention in order to improve the following deficits and impairments:  Pain, Abnormal gait, Decreased activity tolerance, Decreased balance, Decreased strength, Difficulty walking, Decreased mobility, Decreased endurance, Hypomobility, Decreased coordination  Visit Diagnosis: Muscle weakness (generalized)  Other abnormalities of gait and mobility     Problem List Patient Active Problem List   Diagnosis Date Noted   Back pain, chronic 04/03/2013   Anxiety 12/17/2012   Vitamin D insufficiency 12/17/2012   GERD (gastroesophageal reflux disease) 11/18/2012   Bipolar 1 disorder ([place]) 11/18/2012   Fibromyalgia 11/18/2012   Ihor Austin, LPTA/CLT; CBIS [phone]  Aldona Lento, PTA 01/18/2022, 6:07 PM  [place], [place], [address] Phone: [phone]   Fax:  [phone]  Name: Katherine Middleton MRN: [id number] Date of Birth: 04-05-1951

## 2022-01-18 NOTE — Patient Instructions (Addendum)
Bridge    Lie back, legs bent. Inhale, pressing hips up. Keeping ribs in, lengthen lower back. Exhale, rolling down along spine from top. Repeat 10 times. Do 3 sessions per day.  http://pm.exer.us/55   Copyright  VHI. All rights reserved.   FUNCTIONAL MOBILITY: Squat    Stance: shoulder-width on floor. Bend hips and knees. Keep back straight. Do not allow knees to bend past toes.  Squeeze glutes and quads to stand. 10 reps per set, 2 sets per day, 4 days per week  Copyright  VHI. All rights reserved.   Single Leg Balance: Eyes Open    Stand on right leg with eyes open. Hold 30 seconds. 5 reps 2 times per day.  http://ggbe.exer.us/5   Copyright  VHI. All rights reserved.   Tandem Stance    Right foot in front of left, heel touching toe both feet "straight ahead". Stand on Foot Triangle of Support with both feet.  Balance in this position 30 seconds. Do with left foot in front of right.  Copyright  VHI. All rights reserved.

## 2022-01-22 ENCOUNTER — Telehealth (HOSPITAL_COMMUNITY): Payer: Medicare Other | Admitting: Psychiatry

## 2022-01-23 ENCOUNTER — Ambulatory Visit (HOSPITAL_COMMUNITY): Payer: Medicare Other | Admitting: Physical Therapy

## 2022-01-23 ENCOUNTER — Other Ambulatory Visit: Payer: Self-pay

## 2022-01-23 VITALS — HR 67

## 2022-01-23 DIAGNOSIS — M6281 Muscle weakness (generalized): Secondary | ICD-10-CM | POA: Diagnosis not present

## 2022-01-23 DIAGNOSIS — R262 Difficulty in walking, not elsewhere classified: Secondary | ICD-10-CM

## 2022-01-23 DIAGNOSIS — Z9181 History of falling: Secondary | ICD-10-CM

## 2022-01-23 DIAGNOSIS — R2689 Other abnormalities of gait and mobility: Secondary | ICD-10-CM

## 2022-01-23 DIAGNOSIS — M545 Low back pain, unspecified: Secondary | ICD-10-CM

## 2022-01-23 NOTE — Therapy (Signed)
[place], [place], [address] Phone: [phone]   Fax:  [phone]  Physical Therapy Treatment  Patient Details  Name: Katherine Middleton MRN: [id number] Date of Birth: [birth year] Referring Provider (PT): Bing Matter Pa-C   Encounter Date: 01/23/2022   PT End of Session - 01/23/22 1430     Visit Number 4    Number of Visits 8    Date for PT Re-Evaluation 02/05/22    Authorization Type UHC    Authorization Time Period No auth required, no visit limit    PT Start Time 1431    PT Stop Time 1504    PT Time Calculation (min) 33 min    Activity Tolerance Patient tolerated treatment well;Patient limited by fatigue    Behavior During Therapy [facility] for tasks assessed/performed             Past Medical History:  Diagnosis Date   Anxiety    Arthritis    Bipolar disorder ([place])    Depression    Fibromyalgia    GERD (gastroesophageal reflux disease) 12/31/2002   HLD (hyperlipidemia)    diet controlled - no meds   HSV infection    Liver mass 07/2021   Osteoarthritis    Prolonged Q-T interval on ECG 02/2021   Sinusitis 12/31/2012   Wears glasses     Past Surgical History:  Procedure Laterality Date   BIOPSY  08/24/2021   Procedure: BIOPSY;  Surgeon: Irving Copas., MD;  Location: [place];  Service: Gastroenterology;;   BIOPSY  09/11/2021   Procedure: BIOPSY;  Surgeon: Irving Copas., MD;  [facility];  Service: Gastroenterology;;   ESOPHAGOGASTRODUODENOSCOPY (EGD) WITH PROPOFOL N/A 08/11/2021   Procedure: ESOPHAGOGASTRODUODENOSCOPY (EGD) WITH PROPOFOL;  Surgeon: Carol Ada, MD;  [facility]: [facility];  Service: Endoscopy;  Laterality: N/A;   ESOPHAGOGASTRODUODENOSCOPY (EGD) WITH PROPOFOL N/A 08/24/2021   Procedure: ESOPHAGOGASTRODUODENOSCOPY (EGD) WITH PROPOFOL;  Surgeon: Rush Landmark Telford Nab., MD;  Location: [place];  Service: Gastroenterology;  Laterality: N/A;    ESOPHAGOGASTRODUODENOSCOPY (EGD) WITH PROPOFOL N/A 09/11/2021   Procedure: ESOPHAGOGASTRODUODENOSCOPY (EGD) WITH PROPOFOL;  Surgeon: Rush Landmark Telford Nab., MD;  [facility];  Service: Gastroenterology;  Laterality: N/A;   EUS N/A 08/24/2021   Procedure: UPPER ENDOSCOPIC ULTRASOUND (EUS) LINEAR;  Surgeon: Irving Copas., MD;  Location: [place];  Service: Gastroenterology;  Laterality: N/A;   EUS N/A 09/11/2021   Procedure: UPPER ENDOSCOPIC ULTRASOUND (EUS) LINEAR;  Surgeon: Irving Copas., MD;  [facility];  Service: Gastroenterology;  Laterality: N/A;   FINE NEEDLE ASPIRATION  08/24/2021   Procedure: FINE NEEDLE ASPIRATION;  Surgeon: Rush Landmark Telford Nab., MD;  Location: [place];  Service: Gastroenterology;;   FINE NEEDLE ASPIRATION  09/11/2021   Procedure: FINE NEEDLE ASPIRATION (FNA) LINEAR;  Surgeon: Irving Copas., MD;  [facility]: [facility];  Service: Gastroenterology;;  Delaine Lame. not in system to charge   TONSILLECTOMY  04/08/57   some date around then   TUBAL LIGATION  10/21/1984   UPPER ESOPHAGEAL ENDOSCOPIC ULTRASOUND (EUS) N/A 08/11/2021   Procedure: UPPER ESOPHAGEAL ENDOSCOPIC ULTRASOUND (EUS);  Surgeon: Carol Ada, MD;  Location[facility];  Service: Endoscopy;  Laterality: N/A;    Vitals:   01/23/22 1505  Pulse: 67  SpO2: 96%     Subjective Assessment - 01/23/22 1509     Subjective Patient states that everything has been going well at home, but she notices that she has to be careful and slow moving  in order to make sure she does not fall.    Pertinent History fibromyalgia, bipolar, arthritis, osteoporosis    Pain Onset More than a month ago                               [facility] Adult PT Treatment/Exercise - 01/23/22 0001       Neuro Re-ed    Neuro Re-ed Details  Cable walking #2 1x4RT forward and backward and 1x3RT for side/side      Lumbar Exercises: Aerobic   Nustep x71min       Knee/Hip Exercises: Aerobic   Nustep x57min                       PT Short Term Goals - 01/08/22 1336       PT SHORT TERM GOAL #1   Title Patient will be independent with initial HEP and self-management strategies to improve functional outcomes    Time 2    Period Weeks    Status New    Target Date 01/22/22               PT Long Term Goals - 01/08/22 1336       PT LONG TERM GOAL #1   Title Patient will be independent with advanced HEP and self-management strategies to improve functional outcomes    Time 4    Period Weeks    Status New    Target Date 02/05/22      PT LONG TERM GOAL #2   Title Patient will be able to ambulate at least 305 feet during 2MWT with LRAD to demonstrate improved ability to perform functional mobility and associated tasks.    Time 4    Period Weeks    Status New    Target Date 02/05/22      PT LONG TERM GOAL #3   Title Patient will be able to perform stand x 5 in < 13 seconds to demonstrate improvement in functional mobility and reduced risk for falls.    Time 4    Period Weeks    Status New    Target Date 02/05/22                   Plan - 01/23/22 1452     Clinical Impression Statement Patient tolerated treatment well during today's session, but did experience fatigue with the need for extended seated rest breaks beyond what was anticipated. Step length during the cable walking was appropriate without the need for verbal cueing. She was most challenged by the side to side cable walking, but was still able to manage without intervention from the PT. Of note, the patient requested an early end to the session as she was too tired to continue. Vital signs were taken after this and were shown to be normal.    Personal Factors and Comorbidities Past/Current Experience;Comorbidity 3+    Comorbidities fibromyalgia, frequent falls, osteoporosis, arthritis    Examination-Activity Limitations Locomotion Level;Bed  Mobility;Bend;Transfers;Lift;Bathing;Stand;Stairs;Squat;Dressing    Examination-Participation Restrictions Cleaning;Laundry;Shop;Community Activity;Yard Work    Stability/Clinical Decision Making Stable/Uncomplicated    Rehab Potential Good    PT Frequency 2x / week    PT Duration 4 weeks    PT Treatment/Interventions Patient/family education;Gait training;Stair training;Functional mobility training;Therapeutic activities;Therapeutic exercise;Balance training;Manual techniques;ADLs/Self Care Home Management;Biofeedback;Cryotherapy;Fluidtherapy;Parrafin;Ultrasound;Traction;Moist Heat;Iontophoresis 4mg /ml Dexamethasone;DME Instruction;Vestibular;Visual/perceptual remediation/compensation;Passive range of motion;Dry needling;Neuromuscular re-education;Energy conservation;Spinal Manipulations;Manual lymph drainage;Splinting;Joint Manipulations;Orthotic Fit/Training;Compression bandaging;Taping;Vasopneumatic Device;Electrical Stimulation;Contrast  Bath;Canalith Repostioning    PT Next Visit Plan Progress functional strengthening and balance exercise as tolerated. Static balance on complaint surface, progress to dynamic balance and gait.    PT Home Exercise Plan Eval: sit to stand, heel raise, hip abduction 1/12 tandem stance, sidestepping; 1/19: bridge, squat, SLS, tandem stance    Consulted and Agree with Plan of Care Patient             Patient will benefit from skilled therapeutic intervention in order to improve the following deficits and impairments:  Pain, Abnormal gait, Decreased activity tolerance, Decreased balance, Decreased strength, Difficulty walking, Decreased mobility, Decreased endurance, Hypomobility, Decreased coordination  Visit Diagnosis: Muscle weakness (generalized)  Other abnormalities of gait and mobility  Low back pain, unspecified back pain laterality, unspecified chronicity, unspecified whether sciatica present  History of falling  Difficulty in walking, not elsewhere  classified     Problem List Patient Active Problem List   Diagnosis Date Noted   Back pain, chronic 04/03/2013   Anxiety 12/17/2012   Vitamin D insufficiency 12/17/2012   GERD (gastroesophageal reflux disease) 11/18/2012   Bipolar 1 disorder ([place]) 11/18/2012   Fibromyalgia 11/18/2012    3:13 PM,01/23/22 Adalberto Cole, DPT [facility]   [place] [address] [place], [place], [address] Phone: [phone]   Fax:  [phone]  Name: Katherine Middleton MRN: [id number] Date of Birth: 09-Jan-1951

## 2022-01-24 ENCOUNTER — Encounter (HOSPITAL_COMMUNITY): Payer: Self-pay | Admitting: Psychiatry

## 2022-01-24 ENCOUNTER — Telehealth (INDEPENDENT_AMBULATORY_CARE_PROVIDER_SITE_OTHER): Payer: Medicare Other | Admitting: Psychiatry

## 2022-01-24 DIAGNOSIS — F319 Bipolar disorder, unspecified: Secondary | ICD-10-CM | POA: Diagnosis not present

## 2022-01-24 MED ORDER — ALPRAZOLAM 0.5 MG PO TABS: 0.5000 mg | ORAL_TABLET | Freq: Four times a day (QID) | ORAL | 1 refills | Status: DC | PRN

## 2022-01-24 MED ORDER — GABAPENTIN 300 MG PO CAPS: 600.0000 mg | ORAL_CAPSULE | Freq: Two times a day (BID) | ORAL | 2 refills | Status: DC

## 2022-01-24 MED ORDER — PAROXETINE HCL 40 MG PO TABS: 40.0000 mg | ORAL_TABLET | ORAL | 2 refills | Status: DC

## 2022-01-24 MED ORDER — TRAZODONE HCL 150 MG PO TABS: 150.0000 mg | ORAL_TABLET | Freq: Every day | ORAL | 2 refills | Status: DC

## 2022-01-24 NOTE — Progress Notes (Signed)
Virtual Visit via Telephone Note  I connected with Katherine Middleton on 01/24/22 at  1:40 PM EST by telephone and verified that I am speaking with the correct person using two identifiers.  Location: Patient: home Provider: office   I discussed the limitations, risks, security and privacy concerns of performing an evaluation and management service by telephone and the availability of in person appointments. I also discussed with the patient that there may be a patient responsible charge related to this service. The patient expressed understanding and agreed to proceed.      I discussed the assessment and treatment plan with the patient. The patient was provided an opportunity to ask questions and all were answered. The patient agreed with the plan and demonstrated an understanding of the instructions.   The patient was advised to call back or seek an in-person evaluation if the symptoms worsen or if the condition fails to improve as anticipated.  I provided 15 minutes of non-face-to-face time during this encounter.   Levonne Spiller, MD  [facility] MD/PA/NP OP Progress Note  01/24/2022 2:17 PM [place]  MRN:  [id number]  Chief Complaint:  Chief Complaint   Depression; Anxiety; Follow-up    HPI: Patient is 71 year-old widowed Caucasian female who lives alone in [place]. . She is on disability for fibromyalgia arthritis and bipolar disorder.   The patient has been seeing a psychiatrist for years. Initially she was diagnosed with depression but eventually she developed some manic symptoms and now is designated as having bipolar disorder. She's often very tired due to her fibromyalgia and has chronic muscle aches and pains. She feels that the medicines she is on now been very helpful. She sleeping well and her mood is stable. She's not using drugs or alcohol denies suicidal ideation. She's never been psychotic but mentions having  "breakdown" about 10 years ago. She's never been  hospitalized.  The patient returns for follow-up after 3 months.  She has seen a surgeon in consultation regarding the pancreatic mass.  The biopsy did not show malignant cells.  However her CA 19-9 is elevated.  He is leaning in favor of surgery to remove the cystic mass in case it could turn into cancer.  However at this time she is too frail to undergo surgery and she is trying to improve her mobility through physical therapy.  She states that she is trying to eat well but she is not gaining any weight.  However her mood remained stable and she denies significant depression anxiety or anxiety.  She is not in any manic symptoms.  She is sleeping well. Visit Diagnosis:    ICD-10-CM   1. Bipolar 1 disorder (HCC)  F31.9 PARoxetine (PAXIL) 40 MG tablet    traZODone (DESYREL) 150 MG tablet      Past Psychiatric History: Long-term outpatient treatment  Past Medical History:  Past Medical History:  Diagnosis Date   Anxiety    Arthritis    Bipolar disorder ([place])    Depression    Fibromyalgia    GERD (gastroesophageal reflux disease) 12/31/2002   HLD (hyperlipidemia)    diet controlled - no meds   HSV infection    Liver mass 07/2021   Osteoarthritis    Prolonged Q-T interval on ECG 02/2021   Sinusitis 12/31/2012   Wears glasses     Past Surgical History:  Procedure Laterality Date   BIOPSY  08/24/2021   Procedure: BIOPSY;  Surgeon: Irving Copas., MD;  Location: [place];  Service: Gastroenterology;;   BIOPSY  09/11/2021   Procedure: BIOPSY;  Surgeon: Irving Copas., MD;  Location: [facility];  Service: Gastroenterology;;   ESOPHAGOGASTRODUODENOSCOPY (EGD) WITH PROPOFOL N/A 08/11/2021   Procedure: ESOPHAGOGASTRODUODENOSCOPY (EGD) WITH PROPOFOL;  Surgeon: Carol Ada, MD;  [facility];  Service: Endoscopy;  Laterality: N/A;   ESOPHAGOGASTRODUODENOSCOPY (EGD) WITH PROPOFOL N/A 08/24/2021   Procedure: ESOPHAGOGASTRODUODENOSCOPY (EGD) WITH PROPOFOL;   Surgeon: Rush Landmark Telford Nab., MD;  Location: [place];  Service: Gastroenterology;  Laterality: N/A;   ESOPHAGOGASTRODUODENOSCOPY (EGD) WITH PROPOFOL N/A 09/11/2021   Procedure: ESOPHAGOGASTRODUODENOSCOPY (EGD) WITH PROPOFOL;  Surgeon: Rush Landmark Telford Nab., MD;  [facility];  Service: Gastroenterology;  Laterality: N/A;   EUS N/A 08/24/2021   Procedure: UPPER ENDOSCOPIC ULTRASOUND (EUS) LINEAR;  Surgeon: Irving Copas., MD;  Location: [place];  Service: Gastroenterology;  Laterality: N/A;   EUS N/A 09/11/2021   Procedure: UPPER ENDOSCOPIC ULTRASOUND (EUS) LINEAR;  Surgeon: Irving Copas., MD;  [facility];  Service: Gastroenterology;  Laterality: N/A;   FINE NEEDLE ASPIRATION  08/24/2021   Procedure: FINE NEEDLE ASPIRATION;  Surgeon: Rush Landmark Telford Nab., MD;  Location: [place];  Service: Gastroenterology;;   FINE NEEDLE ASPIRATION  09/11/2021   Procedure: FINE NEEDLE ASPIRATION (FNA) LINEAR;  Surgeon: Irving Copas., MD;  [facility];  Service: Gastroenterology;;  Delaine Lame. not in system to charge   TONSILLECTOMY  04/08/57   some date around then   TUBAL LIGATION  10/21/1984   UPPER ESOPHAGEAL ENDOSCOPIC ULTRASOUND (EUS) N/A 08/11/2021   Procedure: UPPER ESOPHAGEAL ENDOSCOPIC ULTRASOUND (EUS);  Surgeon: Carol Ada, MD;  Location: [facility];  Service: Endoscopy;  Laterality: N/A;    Family Psychiatric History: see below  Family History:  Family History  Problem Relation Age of Onset   Bipolar disorder Sister    Anxiety disorder Sister    Dementia Sister 28   Paranoid behavior Sister    Bipolar disorder Father    Schizophrenia Father    Alcohol abuse Maternal Uncle    Alcohol abuse Paternal Uncle    Anxiety disorder Mother    ADD / ADHD Other    ADD / ADHD Other    Healthy Other    OCD Neg Hx    Seizures Neg Hx    Sexual abuse Neg Hx    Physical abuse Neg Hx     Social History:  Social  History   Socioeconomic History   Marital status: Married    Spouse name: Not on file   Number of children: Not on file   Years of education: Not on file   Highest education level: Not on file  Occupational History   Not on file  Tobacco Use   Smoking status: Every Day    Packs/day: 0.50    Years: 30.00    Pack years: 15.00    Types: Cigarettes   Smokeless tobacco: Never   Tobacco comments:    11-15 cigs a day as of 05/01/2013  Vaping Use   Vaping Use: Never used  Substance and Sexual Activity   Alcohol use: No   Drug use: No   Sexual activity: Yes    Partners: Male    Birth control/protection: Post-menopausal  Other Topics Concern   Not on file  Social History Narrative   Left handed    Lives alone    Social Determinants of Health   Financial Resource Strain: Not on file  Food Insecurity: Not on file  Transportation Needs: Not on  file  Physical Activity: Not on file  Stress: Not on file  Social Connections: Not on file    Allergies:  Allergies  Allergen Reactions   Amoxicillin-Pot Clavulanate Anaphylaxis and Swelling   Cephalosporins Anaphylaxis    Other reaction(s): Unknown   Codeine Itching   Cymbalta [Duloxetine Hcl] Other (See Comments)    GERD   Duloxetine     Other reaction(s): Other (See Comments), Unknown GERD    Oxycodone Itching and Other (See Comments)    "OUT THERE", climbing the walls   Penicillins Anaphylaxis and Swelling    Throat swelling Reaction: 8-10 years ago   Baclofen Other (See Comments)    Anxiety got much worse and possibly ppted manic episode Other reaction(s): Unknown   Povidone Iodine Itching and Other (See Comments)    Particularly if in a douche  Other reaction(s): Unknown   Flonase [Fluticasone]     Makes eye pressure to increase   Propoxyphene Itching    Anxiety/jittery    Sulfa Antibiotics Swelling   Sulfasalazine Swelling    Other reaction(s): Unknown   Tramadol     Other reaction(s): Other (See Comments),  Unknown Patient states that this makes her nervous    Amitriptyline Other (See Comments)    Caused bowels to empty quickly and couldn't sleep on it. Other reaction(s): Other (See Comments), Unknown Caused bowels to empty quickly and couldn't sleep on it. Caused bowels to empty quickly and couldn't sleep on it. Caused bowels to empty quickly and couldn't sleep on it.    Aspirin Hives and Rash   Latex Swelling and Other (See Comments)    If in the mouth, it swells   Nsaids Nausea Only and Other (See Comments)    Almost passing out, bad acid reflux Other reaction(s): Other (See Comments) other   Tolmetin Nausea Only    Other reaction(s): Other (See Comments), Unknown Almost passing out, bad acid reflux Almost passing out, bad acid reflux     Metabolic Disorder Labs: No results found for: HGBA1C, MPG No results found for: PROLACTIN No results found for: CHOL, TRIG, HDL, CHOLHDL, VLDL, LDLCALC No results found for: TSH  Therapeutic Level Labs: No results found for: LITHIUM No results found for: VALPROATE No components found for:  CBMZ  Current Medications: Current Outpatient Medications  Medication Sig Dispense Refill   Pancrelipase, Lip-Prot-Amyl, (CREON) 24000-76000 units CPEP Take 2 capsules with each meal, 1 after starting/1 when finished eating, and 1 capsule with snack after starting to eat.     acetaminophen (TYLENOL) 650 MG CR tablet Take 650 mg by mouth daily as needed for pain.     ALPRAZolam (XANAX) 0.5 MG tablet Take 1 tablet (0.5 mg total) by mouth 4 (four) times daily as needed. 360 tablet 1   cetirizine (ZYRTEC) 10 MG tablet Take 10 mg by mouth at bedtime.     Cholecalciferol (VITAMIN D3) 125 MCG (5000 UT) TABS Take 5,000 Units by mouth daily.     CREON 24000-76000 units CPEP Take by mouth.     gabapentin (NEURONTIN) 300 MG capsule Take 2 capsules (600 mg total) by mouth 2 (two) times daily. 480 capsule 2   omeprazole (PRILOSEC) 20 MG capsule Take 20 mg by mouth  daily.      PARoxetine (PAXIL) 40 MG tablet Take 1 tablet (40 mg total) by mouth every morning. 90 tablet 2   Polyethyl Glyc-Propyl Glyc PF (SYSTANE ULTRA PF) 0.4-0.3 % SOLN Place 1 drop into both eyes daily as needed (  Dry eye).     sodium chloride (OCEAN) 0.65 % SOLN nasal spray Place 1 spray into both nostrils as needed for congestion.     tiZANidine (ZANAFLEX) 4 MG tablet Take 4 mg by mouth every 8 (eight) hours as needed (muscle pain).     traZODone (DESYREL) 150 MG tablet Take 1 tablet (150 mg total) by mouth at bedtime. 90 tablet 2   vitamin B-12 (CYANOCOBALAMIN) 1000 MCG tablet Take 1,000 mcg by mouth daily.     No current facility-administered medications for this visit.     Musculoskeletal: Strength & Muscle Tone: na Gait & Station: na Patient leans: N/A  Psychiatric Specialty Exam: Review of Systems  Constitutional:  Positive for unexpected weight change.  Musculoskeletal:  Positive for gait problem.  Neurological:  Positive for weakness.  All other systems reviewed and are negative.  There were no vitals taken for this visit.There is no height or weight on file to calculate BMI.  General Appearance: NA  Eye Contact:  NA  Speech:  Clear and Coherent  Volume:  Normal  Mood:  Euthymic  Affect:  NA  Thought Process:  Goal Directed  Orientation:  Full (Time, Place, and Person)  Thought Content: WDL   Suicidal Thoughts:  No  Homicidal Thoughts:  No  Memory:  Immediate;   Good Recent;   Good Remote;   Fair  Judgement:  Good  Insight:  Fair  Psychomotor Activity:  Decreased  Concentration:  Concentration: Fair and Attention Span: Fair  Recall:  Good  Fund of Knowledge: Good  Language: Good  Akathisia:  No  Handed:  Right  AIMS (if indicated): not done  Assets:  Communication Skills Desire for Improvement Resilience Social Support  ADL's:  Intact  Cognition: WNL  Sleep:  Good   Screenings: PHQ2-9    Flowsheet Row Video Visit from 01/24/2022 in [facility] Video Visit from 10/25/2021 in [place] Video Visit from 07/25/2021 in [facility] Video Visit from 05/02/2021 in [facility] Video Visit from 03/01/2021 in [facility]  PHQ-2 Total Score 0 0 0 0 0      Flowsheet Row Video Visit from 01/24/2022 in [facility] Video Visit from 10/25/2021 in [facility] Admission (Discharged) from 09/11/2021 in [facility] No Risk No Risk No Risk        Assessment and Plan: This patient is a 71 year old female with a history of bipolar disorder.  She is now undergoing a work-up for pancreatic mass.  She has had significant weakness and weight loss.  She is trying to improve her strength.  She may need to undergo surgery.  Despite all this her mood has been stable.  She will continue trazodone 150 mg at bedtime for sleep, Xanax 0.5 mg 4 times daily as needed for anxiety, gabapentin 600 mg twice daily for anxiety and fibromyalgia and Paxil 40 mg daily for depression.  She will return to see me in 3 months   Levonne Spiller, MD 01/24/2022, 2:17 PM

## 2022-01-25 ENCOUNTER — Encounter (HOSPITAL_COMMUNITY): Payer: Self-pay | Admitting: Physical Therapy

## 2022-01-25 ENCOUNTER — Other Ambulatory Visit: Payer: Self-pay

## 2022-01-25 ENCOUNTER — Ambulatory Visit (HOSPITAL_COMMUNITY): Payer: Medicare Other | Admitting: Physical Therapy

## 2022-01-25 DIAGNOSIS — R2689 Other abnormalities of gait and mobility: Secondary | ICD-10-CM

## 2022-01-25 DIAGNOSIS — M545 Low back pain, unspecified: Secondary | ICD-10-CM

## 2022-01-25 DIAGNOSIS — R262 Difficulty in walking, not elsewhere classified: Secondary | ICD-10-CM

## 2022-01-25 DIAGNOSIS — M6281 Muscle weakness (generalized): Secondary | ICD-10-CM

## 2022-01-25 DIAGNOSIS — Z9181 History of falling: Secondary | ICD-10-CM

## 2022-01-25 NOTE — Therapy (Signed)
[facility] [place], [place], [address] Phone: [phone]   Fax:  [phone]  Physical Therapy Treatment  Patient Details  Name: Katherine Middleton MRN: [id number] Date of Birth: [birth year] Referring Provider (PT): Bing Matter Pa-C   Encounter Date: 01/25/2022   PT End of Session - 01/25/22 1444     Visit Number 5    Number of Visits 8    Date for PT Re-Evaluation 02/05/22    Authorization Type U[employer]    Authorization Time Period No auth required, no visit limit    PT Start Time 1431    PT Stop Time 1507    PT Time Calculation (min) 36 min    Activity Tolerance Patient tolerated treatment well;Patient limited by fatigue    Behavior During Therapy [employer] for tasks assessed/performed             Past Medical History:  Diagnosis Date   Anxiety    Arthritis    Bipolar disorder ([place])    Depression    Fibromyalgia    GERD (gastroesophageal reflux disease) 12/31/2002   HLD (hyperlipidemia)    diet controlled - no meds   HSV infection    Liver mass 07/2021   Osteoarthritis    Prolonged Q-T interval on ECG 02/2021   Sinusitis 12/31/2012   Wears glasses     Past Surgical History:  Procedure Laterality Date   BIOPSY  08/24/2021   Procedure: BIOPSY;  Surgeon: Irving Copas., MD;  Location: [place];  Service: Gastroenterology;;   BIOPSY  09/11/2021   Procedure: BIOPSY;  Surgeon: Irving Copas., MD;  [facility];  Service: Gastroenterology;;   ESOPHAGOGASTRODUODENOSCOPY (EGD) WITH PROPOFOL N/A 08/11/2021   Procedure: ESOPHAGOGASTRODUODENOSCOPY (EGD) WITH PROPOFOL;  Surgeon: Carol Ada, MD;  [facility];  Service: Endoscopy;  Laterality: N/A;   ESOPHAGOGASTRODUODENOSCOPY (EGD) WITH PROPOFOL N/A 08/24/2021   Procedure: ESOPHAGOGASTRODUODENOSCOPY (EGD) WITH PROPOFOL;  Surgeon: Rush Landmark Telford Nab., MD;  Location: [facility];  Service: Gastroenterology;  Laterality: N/A;    ESOPHAGOGASTRODUODENOSCOPY (EGD) WITH PROPOFOL N/A 09/11/2021   Procedure: ESOPHAGOGASTRODUODENOSCOPY (EGD) WITH PROPOFOL;  Surgeon: Rush Landmark Telford Nab., MD;  [facility];  Service: Gastroenterology;  Laterality: N/A;   EUS N/A 08/24/2021   Procedure: UPPER ENDOSCOPIC ULTRASOUND (EUS) LINEAR;  Surgeon: Irving Copas., MD;  Location: [facility];  Service: Gastroenterology;  Laterality: N/A;   EUS N/A 09/11/2021   Procedure: UPPER ENDOSCOPIC ULTRASOUND (EUS) LINEAR;  Surgeon: Irving Copas., MD;  [facility];  Service: Gastroenterology;  Laterality: N/A;   FINE NEEDLE ASPIRATION  08/24/2021   Procedure: FINE NEEDLE ASPIRATION;  Surgeon: Rush Landmark Telford Nab., MD;  Location: [facility] [place];  Service: Gastroenterology;;   FINE NEEDLE ASPIRATION  09/11/2021   Procedure: FINE NEEDLE ASPIRATION (FNA) LINEAR;  Surgeon: Irving Copas., MD;  [facility];  Service: Gastroenterology;;  Delaine Lame. not in system to charge   TONSILLECTOMY  04/08/57   some date around then   TUBAL LIGATION  10/21/1984   UPPER ESOPHAGEAL ENDOSCOPIC ULTRASOUND (EUS) N/A 08/11/2021   Procedure: UPPER ESOPHAGEAL ENDOSCOPIC ULTRASOUND (EUS);  Surgeon: Carol Ada, MD;  Location: [facility];  Service: Endoscopy;  Laterality: N/A;    There were no vitals filed for this visit.   Subjective Assessment - 01/25/22 1448     Subjective Patient states that she was a little sore in her L quad after the last visit, but not in a way that impacted her ADLs to any  degree.    Pertinent History fibromyalgia, bipolar, arthritis, osteoporosis    Currently in Pain? Yes    Pain Score 4     Pain Location Knee    Pain Orientation Left    Pain Descriptors / Indicators Aching    Pain Type Chronic pain    Pain Onset More than a month ago                               [facility] Adult PT Treatment/Exercise - 01/25/22 0001       Neuro Re-ed    Neuro Re-ed Details   Cable walking #2 1x4RT forward and backward and 1x3RT for side/side with 3lbs AWs for all, BioDex stability level 8 1x84min      Knee/Hip Exercises: Aerobic   Nustep x31min                       PT Short Term Goals - 01/08/22 1336       PT SHORT TERM GOAL #1   Title Patient will be independent with initial HEP and self-management strategies to improve functional outcomes    Time 2    Period Weeks    Status New    Target Date 01/22/22               PT Long Term Goals - 01/08/22 1336       PT LONG TERM GOAL #1   Title Patient will be independent with advanced HEP and self-management strategies to improve functional outcomes    Time 4    Period Weeks    Status New    Target Date 02/05/22      PT LONG TERM GOAL #2   Title Patient will be able to ambulate at least 305 feet during 2MWT with LRAD to demonstrate improved ability to perform functional mobility and associated tasks.    Time 4    Period Weeks    Status New    Target Date 02/05/22      PT LONG TERM GOAL #3   Title Patient will be able to perform stand x 5 in < 13 seconds to demonstrate improvement in functional mobility and reduced risk for falls.    Time 4    Period Weeks    Status New    Target Date 02/05/22                   Plan - 01/25/22 1458     Clinical Impression Statement Patient tolerated treatment well during today's session and was able to maintain adequate control during the cable walking activity even with the addition of 3bs ankle weights to slow her LE movement speed. Only one light correction was needed during this session during the Rt side walking task. Static balance was assessed using the BioDex with which she score 59% for zone A and 41% zone B. Stability level was set to 8 for this test.    Personal Factors and Comorbidities Past/Current Experience;Comorbidity 3+    Comorbidities fibromyalgia, frequent falls, osteoporosis, arthritis    Examination-Activity  Limitations Locomotion Level;Bed Mobility;Bend;Transfers;Lift;Bathing;Stand;Stairs;Squat;Dressing    Examination-Participation Restrictions Cleaning;Laundry;Shop;Community Activity;Yard Work    Stability/Clinical Decision Making Stable/Uncomplicated    Rehab Potential Good    PT Frequency 2x / week    PT Duration 4 weeks    PT Treatment/Interventions Patient/family education;Gait training;Stair training;Functional mobility training;Therapeutic activities;Therapeutic exercise;Balance training;Manual techniques;ADLs/Self Care Home Management;Biofeedback;Cryotherapy;Fluidtherapy;Parrafin;Ultrasound;Traction;Moist Heat;Iontophoresis  4mg /ml Dexamethasone;DME Instruction;Vestibular;Visual/perceptual remediation/compensation;Passive range of motion;Dry needling;Neuromuscular re-education;Energy conservation;Spinal Manipulations;Manual lymph drainage;Splinting;Joint Manipulations;Orthotic Fit/Training;Compression bandaging;Taping;Vasopneumatic Device;Electrical Stimulation;Contrast Bath;Canalith Repostioning    PT Next Visit Plan Progress functional strengthening and balance exercise as tolerated. Static balance on complaint surface, progress to dynamic balance and gait.    PT Home Exercise Plan Eval: sit to stand, heel raise, hip abduction 1/12 tandem stance, sidestepping; 1/19: bridge, squat, SLS, tandem stance    Consulted and Agree with Plan of Care Patient             Patient will benefit from skilled therapeutic intervention in order to improve the following deficits and impairments:  Pain, Abnormal gait, Decreased activity tolerance, Decreased balance, Decreased strength, Difficulty walking, Decreased mobility, Decreased endurance, Hypomobility, Decreased coordination  Visit Diagnosis: Muscle weakness (generalized)  Other abnormalities of gait and mobility  Low back pain, unspecified back pain laterality, unspecified chronicity, unspecified whether sciatica present  History of  falling  Difficulty in walking, not elsewhere classified     Problem List Patient Active Problem List   Diagnosis Date Noted   Back pain, chronic 04/03/2013   Anxiety 12/17/2012   Vitamin D insufficiency 12/17/2012   GERD (gastroesophageal reflux disease) 11/18/2012   Bipolar 1 disorder ([place]) 11/18/2012   Fibromyalgia 11/18/2012    3:21 PM,01/25/22 Adalberto Cole, DPT [facility]   [facility] [address] [place], [place], [address] Phone: [phone]   Fax:  [phone]  Name: Katherine Middleton MRN: [id number] Date of Birth: 29-Apr-1951

## 2022-01-30 ENCOUNTER — Other Ambulatory Visit: Payer: Self-pay

## 2022-01-30 ENCOUNTER — Ambulatory Visit (HOSPITAL_COMMUNITY): Payer: Medicare Other | Admitting: Physical Therapy

## 2022-01-30 DIAGNOSIS — M6281 Muscle weakness (generalized): Secondary | ICD-10-CM | POA: Diagnosis not present

## 2022-01-30 DIAGNOSIS — R262 Difficulty in walking, not elsewhere classified: Secondary | ICD-10-CM

## 2022-01-30 DIAGNOSIS — M545 Low back pain, unspecified: Secondary | ICD-10-CM

## 2022-01-30 DIAGNOSIS — R2689 Other abnormalities of gait and mobility: Secondary | ICD-10-CM

## 2022-01-30 DIAGNOSIS — Z9181 History of falling: Secondary | ICD-10-CM

## 2022-01-30 NOTE — Therapy (Signed)
[place], [place], [address] Phone: [phone]   Fax:  [phone]  Physical Therapy Treatment  Patient Details  Name: Katherine Middleton MRN: [id number] Date of Birth: March 01, 1951 Referring Provider (PT): Bing Matter Pa-C   Encounter Date: 01/30/2022   PT End of Session - 01/30/22 1448     Visit Number 6    Number of Visits 8    Date for PT Re-Evaluation 02/05/22    Authorization Type UHC    Authorization Time Period No auth required, no visit limit    PT Start Time 1431    PT Stop Time 1511    PT Time Calculation (min) 40 min    Equipment Utilized During Treatment Gait belt    Activity Tolerance Patient tolerated treatment well;Patient limited by fatigue    Behavior During Therapy [facility] for tasks assessed/performed             Past Medical History:  Diagnosis Date   Anxiety    Arthritis    Bipolar disorder ([place])    Depression    Fibromyalgia    GERD (gastroesophageal reflux disease) 12/31/2002   HLD (hyperlipidemia)    diet controlled - no meds   HSV infection    Liver mass 07/2021   Osteoarthritis    Prolonged Q-T interval on ECG 02/2021   Sinusitis 12/31/2012   Wears glasses     Past Surgical History:  Procedure Laterality Date   BIOPSY  08/24/2021   Procedure: BIOPSY;  Surgeon: Irving Copas., MD;  Location: [place];  Service: Gastroenterology;;   BIOPSY  09/11/2021   Procedure: BIOPSY;  Surgeon: Irving Copas., MD;  Location: [facility];  Service: Gastroenterology;;   ESOPHAGOGASTRODUODENOSCOPY (EGD) WITH PROPOFOL N/A 08/11/2021   Procedure: ESOPHAGOGASTRODUODENOSCOPY (EGD) WITH PROPOFOL;  Surgeon: Carol Ada, MD;  Location: [facility];  Service: Endoscopy;  Laterality: N/A;   ESOPHAGOGASTRODUODENOSCOPY (EGD) WITH PROPOFOL N/A 08/24/2021   Procedure: ESOPHAGOGASTRODUODENOSCOPY (EGD) WITH PROPOFOL;  Surgeon: Rush Landmark Telford Nab., MD;  Location: [place];   Service: Gastroenterology;  Laterality: N/A;   ESOPHAGOGASTRODUODENOSCOPY (EGD) WITH PROPOFOL N/A 09/11/2021   Procedure: ESOPHAGOGASTRODUODENOSCOPY (EGD) WITH PROPOFOL;  Surgeon: Rush Landmark Telford Nab., MD;  [facility];  Service: Gastroenterology;  Laterality: N/A;   EUS N/A 08/24/2021   Procedure: UPPER ENDOSCOPIC ULTRASOUND (EUS) LINEAR;  Surgeon: Irving Copas., MD;  Location: [place];  Service: Gastroenterology;  Laterality: N/A;   EUS N/A 09/11/2021   Procedure: UPPER ENDOSCOPIC ULTRASOUND (EUS) LINEAR;  Surgeon: Irving Copas., MD;  [facility];  Service: Gastroenterology;  Laterality: N/A;   FINE NEEDLE ASPIRATION  08/24/2021   Procedure: FINE NEEDLE ASPIRATION;  Surgeon: Rush Landmark Telford Nab., MD;  Location: [place];  Service: Gastroenterology;;   FINE NEEDLE ASPIRATION  09/11/2021   Procedure: FINE NEEDLE ASPIRATION (FNA) LINEAR;  Surgeon: Irving Copas., MD;  Location: [facility];  Service: Gastroenterology;;  Delaine Lame. not in system to charge   TONSILLECTOMY  04/08/57   some date around then   TUBAL LIGATION  10/21/1984   UPPER ESOPHAGEAL ENDOSCOPIC ULTRASOUND (EUS) N/A 08/11/2021   Procedure: UPPER ESOPHAGEAL ENDOSCOPIC ULTRASOUND (EUS);  Surgeon: Carol Ada, MD;  [facility];  Service: Endoscopy;  Laterality: N/A;    There were no vitals filed for this visit.   Subjective Assessment - 01/30/22 1444     Subjective Patient reports that she was very sore in all of her leg muscles after the last session and  is even now having some residual soreness.    Pertinent History fibromyalgia, bipolar, arthritis, osteoporosis    Currently in Pain? Yes    Pain Score 4     Pain Location Knee    Pain Orientation Left    Pain Descriptors / Indicators Aching    Pain Type Chronic pain    Pain Onset More than a month ago                               [facility] Adult PT Treatment/Exercise - 01/30/22  0001       Neuro Re-ed    Neuro Re-ed Details  Cable walking #2 1x4RT forward and backward and 1x3RT for side/side with 2lbs AWs for all, BioDex stability // Forward/backward rocking on foam 1x30s normal stance, 1x30s NS, and 3x30s normal stance with EC                       PT Short Term Goals - 01/08/22 1336       PT SHORT TERM GOAL #1   Title Patient will be independent with initial HEP and self-management strategies to improve functional outcomes    Time 2    Period Weeks    Status New    Target Date 01/22/22               PT Long Term Goals - 01/08/22 1336       PT LONG TERM GOAL #1   Title Patient will be independent with advanced HEP and self-management strategies to improve functional outcomes    Time 4    Period Weeks    Status New    Target Date 02/05/22      PT LONG TERM GOAL #2   Title Patient will be able to ambulate at least 305 feet during 2MWT with LRAD to demonstrate improved ability to perform functional mobility and associated tasks.    Time 4    Period Weeks    Status New    Target Date 02/05/22      PT LONG TERM GOAL #3   Title Patient will be able to perform stand x 5 in < 13 seconds to demonstrate improvement in functional mobility and reduced risk for falls.    Time 4    Period Weeks    Status New    Target Date 02/05/22                   Plan - 01/30/22 1448     Clinical Impression Statement Patient tolerated treatment well during today's session and the AW resistance was decreased from 3lbs to 2lbs based on muscle soreness following the previous session. She performed surprisingly well during the EC foam standing activity, but did need low level corrections in order to prevent a LOB. She improved with each set and was able to use the advice of maintaining sustained arm flexion, when preventing a retropulsive LOB, instead of taking several attempts at swinging the arms forward.    Personal Factors and Comorbidities  Past/Current Experience;Comorbidity 3+    Comorbidities fibromyalgia, frequent falls, osteoporosis, arthritis    Examination-Activity Limitations Locomotion Level;Bed Mobility;Bend;Transfers;Lift;Bathing;Stand;Stairs;Squat;Dressing    Examination-Participation Restrictions Cleaning;Laundry;Shop;Community Activity;Yard Work    Stability/Clinical Decision Making Stable/Uncomplicated    Rehab Potential Good    PT Frequency 2x / week    PT Duration 4 weeks    PT Treatment/Interventions Patient/family education;Gait training;Stair training;Functional  mobility training;Therapeutic activities;Therapeutic exercise;Balance training;Manual techniques;ADLs/Self Care Home Management;Biofeedback;Cryotherapy;Fluidtherapy;Parrafin;Ultrasound;Traction;Moist Heat;Iontophoresis 4mg /ml Dexamethasone;DME Instruction;Vestibular;Visual/perceptual remediation/compensation;Passive range of motion;Dry needling;Neuromuscular re-education;Energy conservation;Spinal Manipulations;Manual lymph drainage;Splinting;Joint Manipulations;Orthotic Fit/Training;Compression bandaging;Taping;Vasopneumatic Device;Electrical Stimulation;Contrast Bath;Canalith Repostioning    PT Next Visit Plan Progress functional strengthening and balance exercise as tolerated. Static balance on complaint surface, progress to dynamic balance and gait.    PT Home Exercise Plan Eval: sit to stand, heel raise, hip abduction 1/12 tandem stance, sidestepping; 1/19: bridge, squat, SLS, tandem stance    Consulted and Agree with Plan of Care Patient             Patient will benefit from skilled therapeutic intervention in order to improve the following deficits and impairments:  Pain, Abnormal gait, Decreased activity tolerance, Decreased balance, Decreased strength, Difficulty walking, Decreased mobility, Decreased endurance, Hypomobility, Decreased coordination  Visit Diagnosis: Muscle weakness (generalized)  Other abnormalities of gait and  mobility  Low back pain, unspecified back pain laterality, unspecified chronicity, unspecified whether sciatica present  History of falling  Difficulty in walking, not elsewhere classified     Problem List Patient Active Problem List   Diagnosis Date Noted   Back pain, chronic 04/03/2013   Anxiety 12/17/2012   Vitamin D insufficiency 12/17/2012   GERD (gastroesophageal reflux disease) 11/18/2012   Bipolar 1 disorder ([place]) 11/18/2012   Fibromyalgia 11/18/2012    Adalberto Cole, PT 01/30/2022, 3:23 PM  [place], [place], [address] Phone: [phone]   Fax:  [phone]  Name: Cierria Height MRN: [id number] Date of Birth: 02-Mar-1951

## 2022-02-01 ENCOUNTER — Other Ambulatory Visit: Payer: Self-pay

## 2022-02-01 ENCOUNTER — Ambulatory Visit (HOSPITAL_COMMUNITY): Payer: Medicare Other | Attending: Family Medicine | Admitting: Physical Therapy

## 2022-02-01 ENCOUNTER — Encounter (HOSPITAL_COMMUNITY): Payer: Self-pay | Admitting: Physical Therapy

## 2022-02-01 DIAGNOSIS — R2689 Other abnormalities of gait and mobility: Secondary | ICD-10-CM | POA: Diagnosis present

## 2022-02-01 DIAGNOSIS — M545 Low back pain, unspecified: Secondary | ICD-10-CM | POA: Diagnosis present

## 2022-02-01 DIAGNOSIS — M6281 Muscle weakness (generalized): Secondary | ICD-10-CM | POA: Diagnosis not present

## 2022-02-01 DIAGNOSIS — R262 Difficulty in walking, not elsewhere classified: Secondary | ICD-10-CM | POA: Diagnosis present

## 2022-02-01 DIAGNOSIS — Z9181 History of falling: Secondary | ICD-10-CM | POA: Insufficient documentation

## 2022-02-01 NOTE — Therapy (Signed)
[place], [place], [address] Phone: [phone]   Fax:  [phone]  Physical Therapy Treatment  Patient Details  Name: Katherine Middleton MRN: [id number] Date of Birth: December 10, 1951 Referring Provider (PT): Bing Matter Pa-C   Encounter Date: 02/01/2022   PT End of Session - 02/01/22 1359     Visit Number 7    Number of Visits 8    Date for PT Re-Evaluation 02/05/22    Authorization Type UHC    Authorization Time Period No auth required, no visit limit    PT Start Time 1350    PT Stop Time 1430    PT Time Calculation (min) 40 min    Equipment Utilized During Treatment Gait belt    Activity Tolerance Patient tolerated treatment well;Patient limited by fatigue    Behavior During Therapy [facility] for tasks assessed/performed             Past Medical History:  Diagnosis Date   Anxiety    Arthritis    Bipolar disorder ([place])    Depression    Fibromyalgia    GERD (gastroesophageal reflux disease) 12/31/2002   HLD (hyperlipidemia)    diet controlled - no meds   HSV infection    Liver mass 07/2021   Osteoarthritis    Prolonged Q-T interval on ECG 02/2021   Sinusitis 12/31/2012   Wears glasses     Past Surgical History:  Procedure Laterality Date   BIOPSY  08/24/2021   Procedure: BIOPSY;  Surgeon: Irving Copas., MD;  Location: [place];  Service: Gastroenterology;;   BIOPSY  09/11/2021   Procedure: BIOPSY;  Surgeon: Irving Copas., MD;  [facility];  Service: Gastroenterology;;   ESOPHAGOGASTRODUODENOSCOPY (EGD) WITH PROPOFOL N/A 08/11/2021   Procedure: ESOPHAGOGASTRODUODENOSCOPY (EGD) WITH PROPOFOL;  Surgeon: Carol Ada, MD;  Location: [facility];  Service: Endoscopy;  Laterality: N/A;   ESOPHAGOGASTRODUODENOSCOPY (EGD) WITH PROPOFOL N/A 08/24/2021   Procedure: ESOPHAGOGASTRODUODENOSCOPY (EGD) WITH PROPOFOL;  Surgeon: Rush Landmark Telford Nab., MD;  Location: [place];   Service: Gastroenterology;  Laterality: N/A;   ESOPHAGOGASTRODUODENOSCOPY (EGD) WITH PROPOFOL N/A 09/11/2021   Procedure: ESOPHAGOGASTRODUODENOSCOPY (EGD) WITH PROPOFOL;  Surgeon: Rush Landmark Telford Nab., MD;  [facility];  Service: Gastroenterology;  Laterality: N/A;   EUS N/A 08/24/2021   Procedure: UPPER ENDOSCOPIC ULTRASOUND (EUS) LINEAR;  Surgeon: Irving Copas., MD;  Location: [place];  Service: Gastroenterology;  Laterality: N/A;   EUS N/A 09/11/2021   Procedure: UPPER ENDOSCOPIC ULTRASOUND (EUS) LINEAR;  Surgeon: Irving Copas., MD;  [facility]: [facility];  Service: Gastroenterology;  Laterality: N/A;   FINE NEEDLE ASPIRATION  08/24/2021   Procedure: FINE NEEDLE ASPIRATION;  Surgeon: Rush Landmark Telford Nab., MD;  Location: [place];  Service: Gastroenterology;;   FINE NEEDLE ASPIRATION  09/11/2021   Procedure: FINE NEEDLE ASPIRATION (FNA) LINEAR;  Surgeon: Irving Copas., MD;  Location: [facility];  Service: Gastroenterology;;  Delaine Lame. not in system to charge   TONSILLECTOMY  04/08/57   some date around then   TUBAL LIGATION  10/21/1984   UPPER ESOPHAGEAL ENDOSCOPIC ULTRASOUND (EUS) N/A 08/11/2021   Procedure: UPPER ESOPHAGEAL ENDOSCOPIC ULTRASOUND (EUS);  Surgeon: Carol Ada, MD;  [facility];  Service: Endoscopy;  Laterality: N/A;    There were no vitals filed for this visit.   Subjective Assessment - 02/01/22 1359     Subjective Patient says she is a little bit dizzy today. Doing pretty well otherwise.    Pertinent History  fibromyalgia, bipolar, arthritis, osteoporosis    Currently in Pain? No/denies    Pain Onset More than a month ago                               [facility] Adult PT Treatment/Exercise - 02/01/22 0001       Lumbar Exercises: Standing   Other Standing Lumbar Exercises tandem stance 3x 20" intermittent HHA      Lumbar Exercises: Seated   Sit to Stand 10 reps      Knee/Hip  Exercises: Standing   Other Standing Knee Exercises tandem gait 2 RT, sidestepping 2 RT                       PT Short Term Goals - 01/08/22 1336       PT SHORT TERM GOAL #1   Title Patient will be independent with initial HEP and self-management strategies to improve functional outcomes    Time 2    Period Weeks    Status New    Target Date 01/22/22               PT Long Term Goals - 01/08/22 1336       PT LONG TERM GOAL #1   Title Patient will be independent with advanced HEP and self-management strategies to improve functional outcomes    Time 4    Period Weeks    Status New    Target Date 02/05/22      PT LONG TERM GOAL #2   Title Patient will be able to ambulate at least 305 feet during 2MWT with LRAD to demonstrate improved ability to perform functional mobility and associated tasks.    Time 4    Period Weeks    Status New    Target Date 02/05/22      PT LONG TERM GOAL #3   Title Patient will be able to perform stand x 5 in < 13 seconds to demonstrate improvement in functional mobility and reduced risk for falls.    Time 4    Period Weeks    Status New    Target Date 02/05/22                   Plan - 02/01/22 1423     Clinical Impression Statement Patient noting increased dizziness upon arrival. Assessed BP at 154/54. Patient says she has not had much to drink today and may be a bit dehydrated. Gave patient 2 small cups of water and retested BP after sit to stands, vitals remain the same. Patient stating she feels ok to exercise today and would like to continue with therapy session. Given rest breaks as needed throughout per fatigue levels. Graded activity per patient tolerance. Static balance continues to improve. No other signs of distress noted. Patient instructed to follow up with PCP or urgent care if symptoms persist or worsen.    Personal Factors and Comorbidities Past/Current Experience;Comorbidity 3+    Comorbidities  fibromyalgia, frequent falls, osteoporosis, arthritis    Examination-Activity Limitations Locomotion Level;Bed Mobility;Bend;Transfers;Lift;Bathing;Stand;Stairs;Squat;Dressing    Examination-Participation Restrictions Cleaning;Laundry;Shop;Community Activity;Yard Work    Stability/Clinical Decision Making Stable/Uncomplicated    Rehab Potential Good    PT Frequency 2x / week    PT Duration 4 weeks    PT Treatment/Interventions Patient/family education;Gait training;Stair training;Functional mobility training;Therapeutic activities;Therapeutic exercise;Balance training;Manual techniques;ADLs/Self Care Home Management;Biofeedback;Cryotherapy;Fluidtherapy;Parrafin;Ultrasound;Traction;Moist Heat;Iontophoresis 4mg /ml Dexamethasone;DME Instruction;Vestibular;Visual/perceptual remediation/compensation;Passive range of motion;Dry  needling;Neuromuscular re-education;Energy conservation;Spinal Manipulations;Manual lymph drainage;Splinting;Joint Manipulations;Orthotic Fit/Training;Compression bandaging;Taping;Vasopneumatic Device;Electrical Stimulation;Contrast Bath;Canalith Repostioning    PT Next Visit Plan Reassess    PT Home Exercise Plan Eval: sit to stand, heel raise, hip abduction 1/12 tandem stance, sidestepping; 1/19: bridge, squat, SLS, tandem stance    Consulted and Agree with Plan of Care Patient             Patient will benefit from skilled therapeutic intervention in order to improve the following deficits and impairments:  Pain, Abnormal gait, Decreased activity tolerance, Decreased balance, Decreased strength, Difficulty walking, Decreased mobility, Decreased endurance, Hypomobility, Decreased coordination  Visit Diagnosis: Muscle weakness (generalized)  Other abnormalities of gait and mobility  Low back pain, unspecified back pain laterality, unspecified chronicity, unspecified whether sciatica present  History of falling  Difficulty in walking, not elsewhere  classified     Problem List Patient Active Problem List   Diagnosis Date Noted   Back pain, chronic 04/03/2013   Anxiety 12/17/2012   Vitamin D insufficiency 12/17/2012   GERD (gastroesophageal reflux disease) 11/18/2012   Bipolar 1 disorder ([place]) 11/18/2012   Fibromyalgia 11/18/2012   2:33 PM, 02/01/22 Josue Hector PT DPT  Physical Therapist with [facility]  [phone] [place], [place], [address] Phone: [phone]   Fax:  [phone]  Name: Katherine Middleton MRN: [id number] Date of Birth: 28-Jul-1951

## 2022-02-06 ENCOUNTER — Ambulatory Visit (HOSPITAL_COMMUNITY): Payer: Medicare Other | Admitting: Physical Therapy

## 2022-02-06 ENCOUNTER — Other Ambulatory Visit: Payer: Self-pay

## 2022-02-06 DIAGNOSIS — R262 Difficulty in walking, not elsewhere classified: Secondary | ICD-10-CM

## 2022-02-06 DIAGNOSIS — R2689 Other abnormalities of gait and mobility: Secondary | ICD-10-CM

## 2022-02-06 DIAGNOSIS — M545 Low back pain, unspecified: Secondary | ICD-10-CM

## 2022-02-06 DIAGNOSIS — M6281 Muscle weakness (generalized): Secondary | ICD-10-CM

## 2022-02-06 DIAGNOSIS — Z9181 History of falling: Secondary | ICD-10-CM

## 2022-02-06 NOTE — Therapy (Signed)
[facility] [address] [place], [place], [address] Phone: [phone]   Fax:  [phone]  Physical Therapy Treatment  Patient Details  Name: Katherine Middleton MRN: [id number] Date of Birth: 08-Dec-1951 Referring Provider (PT): Bing Matter Pa-C   Encounter Date: 02/06/2022  PHYSICAL THERAPY DISCHARGE SUMMARY  Visits from Start of Care: 8  Current functional level related to goals / functional outcomes: See note   Remaining deficits: See note   Education / Equipment: See note   Patient agrees to discharge. Patient goals were partially met. Patient is being discharged due to being pleased with the current functional level.    PT End of Session - 02/06/22 1443     Visit Number 8    Number of Visits 8    Date for PT Re-Evaluation 02/05/22    Authorization Type UHC    Authorization Time Period No auth required, no visit limit    Authorization - Visit Number 10    Authorization - Number of Visits 12    Progress Note Due on Visit 10    PT Start Time 1346    PT Stop Time 1438    PT Time Calculation (min) 52 min    Equipment Utilized During Treatment Gait belt    Activity Tolerance Patient tolerated treatment well;Patient limited by fatigue    Behavior During Therapy WFL for tasks assessed/performed             Past Medical History:  Diagnosis Date   Anxiety    Arthritis    Bipolar disorder ([place])    Depression    Fibromyalgia    GERD (gastroesophageal reflux disease) 12/31/2002   HLD (hyperlipidemia)    diet controlled - no meds   HSV infection    Liver mass 07/2021   Osteoarthritis    Prolonged Q-T interval on ECG 02/2021   Sinusitis 12/31/2012   Wears glasses     Past Surgical History:  Procedure Laterality Date   BIOPSY  08/24/2021   Procedure: BIOPSY;  Surgeon: Irving Copas., MD;  Location: [facility];  Service: Gastroenterology;;   BIOPSY  09/11/2021   Procedure: BIOPSY;  Surgeon:  Irving Copas., MD;  Location: [facility];  Service: Gastroenterology;;   ESOPHAGOGASTRODUODENOSCOPY (EGD) WITH PROPOFOL N/A 08/11/2021   Procedure: ESOPHAGOGASTRODUODENOSCOPY (EGD) WITH PROPOFOL;  Surgeon: Carol Ada, MD;  Location: [facility];  Service: Endoscopy;  Laterality: N/A;   ESOPHAGOGASTRODUODENOSCOPY (EGD) WITH PROPOFOL N/A 08/24/2021   Procedure: ESOPHAGOGASTRODUODENOSCOPY (EGD) WITH PROPOFOL;  Surgeon: Rush Landmark Telford Nab., MD;  Location: [facility];  Service: Gastroenterology;  Laterality: N/A;   ESOPHAGOGASTRODUODENOSCOPY (EGD) WITH PROPOFOL N/A 09/11/2021   Procedure: ESOPHAGOGASTRODUODENOSCOPY (EGD) WITH PROPOFOL;  Surgeon: Rush Landmark Telford Nab., MD;  [facility];  Service: Gastroenterology;  Laterality: N/A;   EUS N/A 08/24/2021   Procedure: UPPER ENDOSCOPIC ULTRASOUND (EUS) LINEAR;  Surgeon: Irving Copas., MD;  Location: [facility];  Service: Gastroenterology;  Laterality: N/A;   EUS N/A 09/11/2021   Procedure: UPPER ENDOSCOPIC ULTRASOUND (EUS) LINEAR;  Surgeon: Irving Copas., MD;  Location: [facility];  Service: Gastroenterology;  Laterality: N/A;   FINE NEEDLE ASPIRATION  08/24/2021   Procedure: FINE NEEDLE ASPIRATION;  Surgeon: Rush Landmark Telford Nab., MD;  Location: [facility];  Service: Gastroenterology;;   FINE NEEDLE ASPIRATION  09/11/2021   Procedure: FINE NEEDLE ASPIRATION (FNA) LINEAR;  Surgeon: Irving Copas., MD;  Location: [facility];  Service: Gastroenterology;;  Delaine Lame. not in system to charge  TONSILLECTOMY  04/08/57   some date around then   TUBAL LIGATION  10/21/1984   UPPER ESOPHAGEAL ENDOSCOPIC ULTRASOUND (EUS) N/A 08/11/2021   Procedure: UPPER ESOPHAGEAL ENDOSCOPIC ULTRASOUND (EUS);  Surgeon: Carol Ada, MD;  Location: [facility];  Service: Endoscopy;  Laterality: N/A;    There were no vitals filed for this visit.   Subjective Assessment - 02/06/22 1348     Subjective Patient  reports that she has gained weight according to her scale at home and at the doctors office. She reports that she has an appointment scheduled for 02/19/2022 during which potential surgery regarding her pancreas is going to be discussed.    Pertinent History fibromyalgia, bipolar, arthritis, osteoporosis    Currently in Pain? Yes    Pain Score 2     Pain Location Shoulder    Pain Orientation Right;Left    Pain Descriptors / Indicators Aching    Pain Onset More than a month ago                [facility] PT Assessment - 02/06/22 0001       Strength   Right Hip Flexion 4+/5    Right Hip ABduction 5/5    Right Hip ADduction 5/5    Left Hip Flexion 4+/5    Left Hip ABduction 4+/5    Left Hip ADduction 5/5    Right Knee Flexion 4+/5    Right Knee Extension 5/5    Left Knee Flexion 4+/5    Left Knee Extension 5/5    Right Ankle Dorsiflexion 4+/5    Left Ankle Dorsiflexion 4+/5      Transfers   Five time sit to stand comments  16.85s      Ambulation/Gait   Ambulation/Gait Yes    Ambulation/Gait Assistance 6: Modified independent (Device/Increase time)    Ambulation Distance (Feet) 270 Feet    Assistive device Small based quad cane    Gait Pattern Decreased arm swing - right    Ambulation Surface Level    Gait Comments 2MWT      Balance   Balance Assessed Yes      Static Standing Balance   Static Standing - Balance Support --   Foam(blue) balance with cervical rotation, flex/ext, and sidebending. Achieved >45s for all without need for therapist correction or UE use.                          [facility] PT Treatment/Exercise - 02/06/22 0001       Neuro Re-ed    Neuro Re-ed Details  Cable walking #2 1x4RT forward and backward and 1x4RT for side/side with 2lbs AWs for all, Forward/backward rocking on foam 2x45s normal stance, normal stance on foam w/ head turns 1x45s, normal stance on foam w/ vertical head motion 1x45s, normal stance on foam w/ cervical side  bending 1x45s      Lumbar Exercises: Aerobic   Nustep x86mn                       PT Short Term Goals - 02/06/22 1428       PT SHORT TERM GOAL #1   Title Patient will be independent with initial HEP and self-management strategies to improve functional outcomes    Time 2    Period Weeks    Status Achieved    Target Date 01/22/22  PT Long Term Goals - 02/06/22 1429       PT LONG TERM GOAL #1   Title Patient will be independent with advanced HEP and self-management strategies to improve functional outcomes    Time 4    Period Weeks    Status Achieved    Target Date 02/05/22      PT LONG TERM GOAL #2   Title Patient will be able to ambulate at least 305 feet during 2MWT with LRAD to demonstrate improved ability to perform functional mobility and associated tasks.    Baseline 88.5%    Time 4    Period Weeks    Status Partially Met    Target Date 02/05/22      PT LONG TERM GOAL #3   Title Patient will be able to perform stand x 5 in < 13 seconds to demonstrate improvement in functional mobility and reduced risk for falls.    Baseline 18.24s    Time 4    Period Weeks    Status Partially Met    Target Date 02/05/22      PT LONG TERM GOAL #4   Baseline 4+ gross    Status Achieved                   Plan - 02/06/22 1541     Clinical Impression Statement Patient tolerated treatment well during today's session and was able to achieve ~88% of her long term ambulation goal even after a nearly full session. She did become distracted and alter gait speed at several points during the 2MWT so it is likely that the patient would have been able to achieve the goal if maximal attention and effort had been given. Based on her initial strength and funcitonal measurements documented during the initial evaluation I do believe she has come a long way. That being said I do believe there is more room for improvement in regards to balance. Strength has  progressed well and relative to her size I believe it to be adequate. Endurance is still impaired, but she is able to recover relatively quickly following seated rest breaks. Based on her report of having an appointment scheduled on 02/19/2022 during which a discussion regarding possible pancreatic surgery is supposed ot take place, I believe it would be best to wait for the decision from that appointment. I do believe that as long as the patient takes her time with mobility and ambulation at home she is safe for discharge. If addiitonal strengthening and conditioning for the patient is desired prior to or after surgery we will be happy to provide PT services for her.    Personal Factors and Comorbidities Past/Current Experience;Comorbidity 3+    Comorbidities fibromyalgia, frequent falls, osteoporosis, arthritis    Examination-Activity Limitations Locomotion Level;Bend;Squat    Examination-Participation Restrictions Laundry;Shop;Community Activity;Yard Work    Stability/Clinical Decision Making Stable/Uncomplicated    Rehab Potential Good    PT Treatment/Interventions Patient/family education;Gait training;Stair training;Functional mobility training;Therapeutic activities;Therapeutic exercise;Balance training;Manual techniques;ADLs/Self Care Home Management;Biofeedback;Cryotherapy;Fluidtherapy;Parrafin;Ultrasound;Traction;Moist Heat;Iontophoresis 29m/ml Dexamethasone;DME Instruction;Vestibular;Visual/perceptual remediation/compensation;Passive range of motion;Dry needling;Neuromuscular re-education;Energy conservation;Spinal Manipulations;Manual lymph drainage;Splinting;Joint Manipulations;Orthotic Fit/Training;Compression bandaging;Taping;Vasopneumatic Device;Electrical Stimulation;Contrast Bath;Canalith Repostioning    PT Next Visit Plan N/A    PT Home Exercise Plan Eval: sit to stand, heel raise, hip abduction 1/12 tandem stance, sidestepping; 1/19: bridge, squat, SLS, tandem stance    Consulted and  Agree with Plan of Care Patient             Patient will benefit from skilled  therapeutic intervention in order to improve the following deficits and impairments:  Pain, Abnormal gait, Decreased activity tolerance, Decreased balance, Decreased strength, Difficulty walking, Decreased mobility, Decreased endurance, Hypomobility, Decreased coordination  Visit Diagnosis: Muscle weakness (generalized)  Other abnormalities of gait and mobility  Low back pain, unspecified back pain laterality, unspecified chronicity, unspecified whether sciatica present  History of falling  Difficulty in walking, not elsewhere classified     Problem List Patient Active Problem List   Diagnosis Date Noted   Back pain, chronic 04/03/2013   Anxiety 12/17/2012   Vitamin D insufficiency 12/17/2012   GERD (gastroesophageal reflux disease) 11/18/2012   Bipolar 1 disorder ([facility]) 11/18/2012   Fibromyalgia 11/18/2012    Adalberto Cole, PT 02/06/2022, 4:03 PM  [facility] [place], [place], [address] Phone: [phone]   Fax:  [phone]  Name: Katherine Middleton MRN: [id number] Date of Birth: December 24, 1951

## 2022-02-07 ENCOUNTER — Ambulatory Visit (HOSPITAL_COMMUNITY): Payer: Medicare Other | Admitting: Physical Therapy

## 2022-02-08 ENCOUNTER — Encounter (HOSPITAL_COMMUNITY): Payer: Medicare Other | Admitting: Physical Therapy

## 2022-02-26 ENCOUNTER — Ambulatory Visit: Payer: Medicare Other | Admitting: Neurology

## 2022-02-26 ENCOUNTER — Encounter: Payer: Self-pay | Admitting: Neurology

## 2022-02-26 ENCOUNTER — Ambulatory Visit: Payer: Medicare HMO | Admitting: Neurology

## 2022-02-26 DIAGNOSIS — Z029 Encounter for administrative examinations, unspecified: Secondary | ICD-10-CM

## 2022-03-21 ENCOUNTER — Ambulatory Visit (HOSPITAL_COMMUNITY): Payer: Medicare Other | Attending: Family Medicine | Admitting: Physical Therapy

## 2022-03-21 ENCOUNTER — Encounter (HOSPITAL_COMMUNITY): Payer: Self-pay | Admitting: Physical Therapy

## 2022-03-21 ENCOUNTER — Other Ambulatory Visit: Payer: Self-pay

## 2022-03-21 DIAGNOSIS — Z9181 History of falling: Secondary | ICD-10-CM | POA: Diagnosis present

## 2022-03-21 DIAGNOSIS — M6281 Muscle weakness (generalized): Secondary | ICD-10-CM | POA: Insufficient documentation

## 2022-03-21 DIAGNOSIS — M545 Low back pain, unspecified: Secondary | ICD-10-CM | POA: Diagnosis present

## 2022-03-21 DIAGNOSIS — R2689 Other abnormalities of gait and mobility: Secondary | ICD-10-CM | POA: Insufficient documentation

## 2022-03-21 DIAGNOSIS — R262 Difficulty in walking, not elsewhere classified: Secondary | ICD-10-CM | POA: Diagnosis present

## 2022-03-21 NOTE — Therapy (Signed)
?OUTPATIENT PHYSICAL THERAPY LOWER EXTREMITY EVALUATION ? ? ?Patient Name: Katherine Middleton ?MRN: [id number] ?DOB:11/25/1951, 71 y.o., female ?Today's Date: 03/21/2022 ? ? PT End of Session - 03/21/22 1440   ? ? Visit Number 1   ? Number of Visits 12   ? Date for PT Re-Evaluation 05/02/22   ? Authorization Type UHC   ? Authorization Time Period No auth required, no visit limit   ? Authorization - Visit Number 10   ? PT Start Time 4098   ? PT Stop Time 1513   ? PT Time Calculation (min) 40 min   ? Activity Tolerance Patient tolerated treatment well;Patient limited by fatigue   ? Behavior During Therapy [facility] for tasks assessed/performed   ? ?  ?  ? ?  ? ? ?Past Medical History:  ?Diagnosis Date  ? Anxiety   ? Arthritis   ? Bipolar disorder ([facility])   ? Depression   ? Fibromyalgia   ? GERD (gastroesophageal reflux disease) 12/31/2002  ? HLD (hyperlipidemia)   ? diet controlled - no meds  ? HSV infection   ? Liver mass 07/2021  ? Osteoarthritis   ? Prolonged Q-T interval on ECG 02/2021  ? Sinusitis 12/31/2012  ? Wears glasses   ? ?Past Surgical History:  ?Procedure Laterality Date  ? BIOPSY  08/24/2021  ? Procedure: BIOPSY;  Surgeon: Irving Copas., MD;  Location: [facility];  Service: Gastroenterology;;  ? BIOPSY  09/11/2021  ? Procedure: BIOPSY;  Surgeon: Irving Copas., MD;  Location: [facility];  Service: Gastroenterology;;  ? ESOPHAGOGASTRODUODENOSCOPY (EGD) WITH PROPOFOL N/A 08/11/2021  ? Procedure: ESOPHAGOGASTRODUODENOSCOPY (EGD) WITH PROPOFOL;  Surgeon: Carol Ada, MD;  Location: [facility];  Service: Endoscopy;  Laterality: N/A;  ? ESOPHAGOGASTRODUODENOSCOPY (EGD) WITH PROPOFOL N/A 08/24/2021  ? Procedure: ESOPHAGOGASTRODUODENOSCOPY (EGD) WITH PROPOFOL;  Surgeon: Rush Landmark Telford Nab., MD;  Location: [facility];  Service: Gastroenterology;  Laterality: N/A;  ? ESOPHAGOGASTRODUODENOSCOPY (EGD) WITH PROPOFOL N/A 09/11/2021  ? Procedure: ESOPHAGOGASTRODUODENOSCOPY (EGD) WITH PROPOFOL;   Surgeon: Rush Landmark Telford Nab., MD;  Location: [facility];  Service: Gastroenterology;  Laterality: N/A;  ? EUS N/A 08/24/2021  ? Procedure: UPPER ENDOSCOPIC ULTRASOUND (EUS) LINEAR;  Surgeon: Rush Landmark Telford Nab., MD;  Location: [facility];  Service: Gastroenterology;  Laterality: N/A;  ? EUS N/A 09/11/2021  ? Procedure: UPPER ENDOSCOPIC ULTRASOUND (EUS) LINEAR;  Surgeon: Rush Landmark Telford Nab., MD;  Location: [facility];  Service: Gastroenterology;  Laterality: N/A;  ? FINE NEEDLE ASPIRATION  08/24/2021  ? Procedure: FINE NEEDLE ASPIRATION;  Surgeon: Rush Landmark Telford Nab., MD;  Location: [facility];  Service: Gastroenterology;;  ? FINE NEEDLE ASPIRATION  09/11/2021  ? Procedure: FINE NEEDLE ASPIRATION (FNA) LINEAR;  Surgeon: Rush Landmark Telford Nab., MD;  Location: [facility];  Service: Gastroenterology;;  Delaine Lame. not in system to charge  ? TONSILLECTOMY  04/08/57  ? some date around then  ? TUBAL LIGATION  10/21/1984  ? UPPER ESOPHAGEAL ENDOSCOPIC ULTRASOUND (EUS) N/A 08/11/2021  ? Procedure: UPPER ESOPHAGEAL ENDOSCOPIC ULTRASOUND (EUS);  Surgeon: Carol Ada, MD;  Location: [facility];  Service: Endoscopy;  Laterality: N/A;  ? ?Patient Active Problem List  ? Diagnosis Date Noted  ? Back pain, chronic 04/03/2013  ? Anxiety 12/17/2012  ? Vitamin D insufficiency 12/17/2012  ? GERD (gastroesophageal reflux disease) 11/18/2012  ? Bipolar 1 disorder ([place]) 11/18/2012  ? Fibromyalgia 11/18/2012  ? ? ?PCP: Aletha Halim., PA-C ? ?REFERRING PROVIDER: Aletha Halim., PA-C ? ?REFERRING DIAG: weakness of both lower extremities upper extermity weakness  ? ?  THERAPY DIAG:  ?Muscle weakness (generalized) ? ?Other abnormalities of gait and mobility ? ?ONSET DATE: Chronic  ? ?SUBJECTIVE:  ? ?SUBJECTIVE STATEMENT: ?Patient returns to clinic with complaint of LE weakness. She was discharged for similar episode about 6 weeks ago. She states ?I never should have stopped?. Says she is here for LE  strengthening in preparation for pancreas surgery. She denies any recent falls but has had ongoing issues with dizziness and LE weakness.  ? ?PERTINENT HISTORY: ?Arthritis, fibromyalgia, anxiety, chronic LBP ? ?PAIN:  ?Are you having pain? Yes: NPRS scale: 5/10 ?Pain location: low back  ?Pain description: aching ?Aggravating factors: standing,  ?Relieving factors: rest, tylenol  ? ?PRECAUTIONS: Fall ? ?WEIGHT BEARING RESTRICTIONS No ? ?FALLS:  ?Has patient fallen in last 6 months? No, Number of falls:  ? ?LIVING ENVIRONMENT: ?Lives with: lives alone ?Lives in: House/apartment ? ?OCCUPATION: Disability ? ?PLOF: Independent with basic ADLs ? ?PATIENT GOALS Feel stronger and be able to keep up  ? ? ?OBJECTIVE:  ? ?DIAGNOSTIC FINDINGS: NA ? ? ?COGNITION: ? Overall cognitive status: Within functional limits for tasks assessed   ?  ? ?LE ROM: ? ?Active ROM Right ?03/21/2022 Left ?03/21/2022  ?Hip flexion    ?Hip extension    ?Hip abduction    ?Hip adduction    ?Hip internal rotation    ?Hip external rotation    ?Knee flexion    ?Knee extension    ?Ankle dorsiflexion    ?Ankle plantarflexion    ?Ankle inversion    ?Ankle eversion    ? (Blank rows = not tested) ? ?LE MMT: ? ?MMT Right ?03/21/2022 Left ?03/21/2022  ?Hip flexion 5 5  ?Hip extension 4 4  ?Hip abduction 4+ 4+  ?Hip adduction    ?Hip internal rotation    ?Hip external rotation    ?Knee flexion    ?Knee extension 5 5  ?Ankle dorsiflexion 5 5  ?Ankle plantarflexion    ?Ankle inversion    ?Ankle eversion    ? (Blank rows = not tested) ? ? ? ?FUNCTIONAL TESTS:  ?5 times sit to stand: 18.8 seconds with use of hands on thighs  ?2 minute walk test: 226 feet using quad cane, decreased stride and noted foot drop on LLE  ?Balance: tandam 4 sec RT, 7 sec LT ?Single leg stance: unable to maintain without UE assist  ? ? ?TODAY'S TREATMENT: ?03/21/22  ?Heel raise ?Toe raise ?Standing march  ? ? ?PATIENT EDUCATION:  ?Education details: On evaluation findings, POC and HEP  ?Person  educated: Patient ?Education method: Explanation and Demonstration ?Education comprehension: verbalized understanding and returned demonstration ? ? ?HOME EXERCISE PROGRAM: ?Access Code: [email or web address] ?URL: [email or web address] ?Date: 03/21/2022 ?Prepared by: Josue Hector ? ?Exercises ?Heel Raises with Counter Support - 3 x daily - 7 x weekly - 2 sets - 10 reps ?Toe Raises with Counter Support - 3 x daily - 7 x weekly - 2 sets - 10 reps ?Standing March with Counter Support - 3 x daily - 7 x weekly - 2 sets - 10 reps ? ? ?ASSESSMENT: ? ?CLINICAL IMPRESSION: ?Patient is a 71 y.o. female who presents to physical therapy with complaint of LE weakness and balance difficulty. Patient demonstrates decreased strength, balance deficits and gait abnormalities which are negatively impacting patient ability to perform ADLs and functional mobility tasks. Patient will benefit from skilled physical therapy services to address these deficits to improve level of function with ADLs, functional mobility tasks, and reduce risk for falls.  ? ? ? ?  OBJECTIVE IMPAIRMENTS Abnormal gait, decreased activity tolerance, decreased balance, decreased endurance, decreased mobility, difficulty walking, decreased strength, improper body mechanics, and pain.  ? ?ACTIVITY LIMITATIONS cleaning, community activity, meal prep, laundry, yard work, shopping, and yard work.  ? ?PERSONAL FACTORS Past/current experiences and Time since onset of injury/illness/exacerbation are also affecting patient's functional outcome.  ? ? ?REHAB POTENTIAL: Fair chronic nature/ history  ? ?CLINICAL DECISION MAKING: Stable/uncomplicated ? ?EVALUATION COMPLEXITY: Low ? ? ?GOALS: ? ? ?SHORT TERM GOALS: Target date: 04/04/2022 ? ? Patient will be independent with initial HEP and self-management strategies to improve functional outcomes ?Baseline:  ?Goal status: INITIAL  ? ?LONG TERM GOALS: Target date: 05/02/22 ? ?Patient will be independent with advanced HEP and  self-management strategies to improve functional outcomes ?Baseline:  ?Goal status: INITIAL ? ?2. Patient will be able to perform stand x 5 in < 15 seconds to demonstrate improvement in functional mobility and reduced risk

## 2022-03-28 ENCOUNTER — Ambulatory Visit (HOSPITAL_COMMUNITY): Payer: Medicare Other | Admitting: Physical Therapy

## 2022-03-28 DIAGNOSIS — M6281 Muscle weakness (generalized): Secondary | ICD-10-CM | POA: Diagnosis not present

## 2022-03-28 DIAGNOSIS — Z9181 History of falling: Secondary | ICD-10-CM

## 2022-03-28 DIAGNOSIS — R2689 Other abnormalities of gait and mobility: Secondary | ICD-10-CM

## 2022-03-28 DIAGNOSIS — M545 Low back pain, unspecified: Secondary | ICD-10-CM

## 2022-03-28 DIAGNOSIS — R262 Difficulty in walking, not elsewhere classified: Secondary | ICD-10-CM

## 2022-03-28 NOTE — Therapy (Signed)
?OUTPATIENT PHYSICAL THERAPY TREATMENT NOTE ? ? ?Patient Name: Katherine Middleton ?MRN: [id number] ?DOB:February 01, 1951, 71 y.o., female ?Today's Date: 03/28/2022 ? ?PCP: Aletha Halim., PA-C ?REFERRING PROVIDER: Aletha Halim., PA-C ? ? PT End of Session - 03/28/22 1457   ? ? Visit Number 2   ? Number of Visits 12   ? Date for PT Re-Evaluation 05/02/22   ? Authorization Type UHC   ? Authorization Time Period No auth required, no visit limit   ? Authorization - Visit Number 10   ? PT Start Time 1358   ? PT Stop Time 1425   ? PT Time Calculation (min) 27 min   ? Activity Tolerance Patient tolerated treatment well;Patient limited by fatigue   ? Behavior During Therapy [facility] for tasks assessed/performed   ? ?  ?  ? ?  ? ? ?Past Medical History:  ?Diagnosis Date  ? Anxiety   ? Arthritis   ? Bipolar disorder ([place])   ? Depression   ? Fibromyalgia   ? GERD (gastroesophageal reflux disease) 12/31/2002  ? HLD (hyperlipidemia)   ? diet controlled - no meds  ? HSV infection   ? Liver mass 07/2021  ? Osteoarthritis   ? Prolonged Q-T interval on ECG 02/2021  ? Sinusitis 12/31/2012  ? Wears glasses   ? ?Past Surgical History:  ?Procedure Laterality Date  ? BIOPSY  08/24/2021  ? Procedure: BIOPSY;  Surgeon: Irving Copas., MD;  Location: [place];  Service: Gastroenterology;;  ? BIOPSY  09/11/2021  ? Procedure: BIOPSY;  Surgeon: Irving Copas., MD;  Location: [facility];  Service: Gastroenterology;;  ? ESOPHAGOGASTRODUODENOSCOPY (EGD) WITH PROPOFOL N/A 08/11/2021  ? Procedure: ESOPHAGOGASTRODUODENOSCOPY (EGD) WITH PROPOFOL;  Surgeon: Carol Ada, MD;  Location: [facility];  Service: Endoscopy;  Laterality: N/A;  ? ESOPHAGOGASTRODUODENOSCOPY (EGD) WITH PROPOFOL N/A 08/24/2021  ? Procedure: ESOPHAGOGASTRODUODENOSCOPY (EGD) WITH PROPOFOL;  Surgeon: Rush Landmark Telford Nab., MD;  Location: [place];  Service: Gastroenterology;  Laterality: N/A;  ? ESOPHAGOGASTRODUODENOSCOPY (EGD) WITH PROPOFOL N/A 09/11/2021  ?  Procedure: ESOPHAGOGASTRODUODENOSCOPY (EGD) WITH PROPOFOL;  Surgeon: Rush Landmark Telford Nab., MD;  Location: [facility];  Service: Gastroenterology;  Laterality: N/A;  ? EUS N/A 08/24/2021  ? Procedure: UPPER ENDOSCOPIC ULTRASOUND (EUS) LINEAR;  Surgeon: Rush Landmark Telford Nab., MD;  Location: [place];  Service: Gastroenterology;  Laterality: N/A;  ? EUS N/A 09/11/2021  ? Procedure: UPPER ENDOSCOPIC ULTRASOUND (EUS) LINEAR;  Surgeon: Rush Landmark Telford Nab., MD;  Location: [facility];  Service: Gastroenterology;  Laterality: N/A;  ? FINE NEEDLE ASPIRATION  08/24/2021  ? Procedure: FINE NEEDLE ASPIRATION;  Surgeon: Rush Landmark Telford Nab., MD;  Location: [place];  Service: Gastroenterology;;  ? FINE NEEDLE ASPIRATION  09/11/2021  ? Procedure: FINE NEEDLE ASPIRATION (FNA) LINEAR;  Surgeon: Rush Landmark Telford Nab., MD;  Location: [facility];  Service: Gastroenterology;;  Delaine Lame. not in system to charge  ? TONSILLECTOMY  04/08/57  ? some date around then  ? TUBAL LIGATION  10/21/1984  ? UPPER ESOPHAGEAL ENDOSCOPIC ULTRASOUND (EUS) N/A 08/11/2021  ? Procedure: UPPER ESOPHAGEAL ENDOSCOPIC ULTRASOUND (EUS);  Surgeon: Carol Ada, MD;  Location: [facility];  Service: Endoscopy;  Laterality: N/A;  ? ?Patient Active Problem List  ? Diagnosis Date Noted  ? Back pain, chronic 04/03/2013  ? Anxiety 12/17/2012  ? Vitamin D insufficiency 12/17/2012  ? GERD (gastroesophageal reflux disease) 11/18/2012  ? Bipolar 1 disorder ([place]) 11/18/2012  ? Fibromyalgia 11/18/2012  ? ? ?REFERRING DIAG: weakness of both LE and UE weakness ? ?THERAPY DIAG:  ?  Muscle weakness (generalized) ? ?Low back pain, unspecified back pain laterality, unspecified chronicity, unspecified whether sciatica present ? ?Other abnormalities of gait and mobility ? ?History of falling ? ?Difficulty in walking, not elsewhere classified ? ?PERTINENT HISTORY: arthritis, fibromyalgia, anxiety, chronic LBP ? ?PRECAUTIONS: fall ? ?SUBJECTIVE: Pt states she  is hurting "all over" with an aching feeling.  Specifically her neck, shoulders and back.  Currently 4/10 generalized.  Reports she did not do her exercises as she should after her last discharge. ? ?PAIN:  ?Are you having pain? Yes: NPRS scale: 4/10 ?Pain location: generalized ?Pain description: all over aching ?Aggravating factors: lifting something too heavy, going up/down stairs ?Relieving factors: heat ? ? ? ?OBJECTIVE:  ?  ?TODAY'S TREATMENT: ?03/28/22  ?Heel raise 10X ?Toe raise 10X ?Standing march 10X ?Tandem stance 30" X2 (10" max either LE lead) ?Sit to stands 10X from standard height no UE's ?  ?03/21/22  ?Heel raise  ?Toe raise  ?Standing march   ?  ?  ?PATIENT EDUCATION:  ?Education details: goal, HEP review and POC moving forward ?Person educated: Patient ?Education method: Explanation and Demonstration ?Education comprehension: verbalized understanding and returned demonstration ?  ?  ?HOME EXERCISE PROGRAM: ?Access Code: [email or web address] ?URL: [email or web address] ?Date: 03/21/2022 ?Prepared by: Josue Hector ?  ?Exercises ?Heel Raises with Counter Support - 3 x daily - 7 x weekly - 2 sets - 10 reps ?Toe Raises with Counter Support - 3 x daily - 7 x weekly - 2 sets - 10 reps ?Standing March with Counter Support - 3 x daily - 7 x weekly - 2 sets - 10 reps ?  ?  ?ASSESSMENT: ?  ?CLINICAL IMPRESSION: ? PT arrived late for session today.  Reviewed goals, HEP and POC moving forward.  Pt able to recall added therex from last session and added sit to stands and standing tandem stance this session.  PT unable to maintain balance greater than 10" with either LE leading without UE assist.  Patient will benefit from skilled physical therapy services  to improve level of function with ADLs, functional mobility tasks, and reduce risk for falls.  ?  ?  ?  ?OBJECTIVE IMPAIRMENTS Abnormal gait, decreased activity tolerance, decreased balance, decreased endurance, decreased mobility, difficulty walking,  decreased strength, improper body mechanics, and pain.  ?  ?ACTIVITY LIMITATIONS cleaning, community activity, meal prep, laundry, yard work, shopping, and yard work.  ?  ?PERSONAL FACTORS Past/current experiences and Time since onset of injury/illness/exacerbation are also affecting patient's functional outcome.  ?  ?  ?REHAB POTENTIAL: Fair chronic nature/ history  ?  ?CLINICAL DECISION MAKING: Stable/uncomplicated ?  ?EVALUATION COMPLEXITY: Low ?  ?  ?GOALS: ?  ?  ?SHORT TERM GOALS: Target date: 04/04/2022 ?  ? Patient will be independent with initial HEP and self-management strategies to improve functional outcomes ?Baseline:  ?Goal status: ongoing ?  ?LONG TERM GOALS: Target date: 05/02/22 ?  ?Patient will be independent with advanced HEP and self-management strategies to improve functional outcomes ?Baseline:  ?Goal status: ongoing ?  ?2. Patient will be able to perform stand x 5 in < 15 seconds to demonstrate improvement in functional mobility and reduced risk for falls.  ?Baseline:  ?Goal status: ongoing ?  ?3.  Patient will report reduction of back pain to <3/10 at rest for improved quality of life and ability to perform ADLs  ?Baseline:  ?Goal status: ongoing ?  ?4. Patient will be able to maintain tandem stance >30 seconds on BLEs to improve  stability and reduce risk for falls ?Baseline:  ?Goal status: ongoing ?  ?5. Patient will be able to ambulate at least 325 feet during 2MWT with LRAD to demonstrate improved ability to perform functional mobility and associated tasks. ?Baseline:  ?Goal status: INITIAL ?  ?PLAN: ?PT FREQUENCY: 2x/week ?  ?PT DURATION: 6 weeks ?  ?PLANNED INTERVENTIONS: Therapeutic exercises, Therapeutic activity, Neuromuscular re-education, Balance training, Gait training, Patient/Family education, Joint manipulation, Joint mobilization, Stair training, Aquatic Therapy, Dry Needling, Electrical stimulation, Spinal manipulation, Spinal mobilization, Cryotherapy, Moist heat, scar  mobilization, Taping, Traction, Ultrasound, Biofeedback, Ionotophoresis '4mg'$ /ml Dexamethasone, and Manual therapy. ?  ?  ?PLAN FOR NEXT SESSION: Progress functional LE strength, balance, gait and stairs. Begin tandem

## 2022-04-04 ENCOUNTER — Ambulatory Visit (HOSPITAL_COMMUNITY): Payer: Medicare Other | Attending: Family Medicine | Admitting: Physical Therapy

## 2022-04-04 DIAGNOSIS — M545 Low back pain, unspecified: Secondary | ICD-10-CM | POA: Diagnosis present

## 2022-04-04 DIAGNOSIS — Z9181 History of falling: Secondary | ICD-10-CM | POA: Diagnosis present

## 2022-04-04 DIAGNOSIS — R2689 Other abnormalities of gait and mobility: Secondary | ICD-10-CM | POA: Diagnosis present

## 2022-04-04 DIAGNOSIS — M6281 Muscle weakness (generalized): Secondary | ICD-10-CM | POA: Insufficient documentation

## 2022-04-04 DIAGNOSIS — R262 Difficulty in walking, not elsewhere classified: Secondary | ICD-10-CM | POA: Insufficient documentation

## 2022-04-04 NOTE — Therapy (Signed)
?OUTPATIENT PHYSICAL THERAPY TREATMENT NOTE ? ? ?Patient Name: Katherine Middleton ?MRN: [id number] ?DOB:[birth year], 71 y.o., female ?Today's Date: 04/04/2022 ? ?PCP: Aletha Halim., PA-C ?REFERRING PROVIDER: Aletha Halim., PA-C ? ?END OF SESSION:  ? PT End of Session - 04/04/22 1437   ? ? Visit Number 3   ? Number of Visits 12   ? Date for PT Re-Evaluation 05/02/22   ? Authorization Type UHC   ? Authorization Time Period No auth required, no visit limit   ? Authorization - Visit Number 11   ? PT Start Time 4332   ? PT Stop Time 9518   ? PT Time Calculation (min) 40 min   ? Activity Tolerance Patient tolerated treatment well;Patient limited by fatigue   ? Behavior During Therapy [facility] for tasks assessed/performed   ? ?  ?  ? ?  ? ? ?Past Medical History:  ?Diagnosis Date  ? Anxiety   ? Arthritis   ? Bipolar disorder ([place])   ? Depression   ? Fibromyalgia   ? GERD (gastroesophageal reflux disease) 12/31/2002  ? HLD (hyperlipidemia)   ? diet controlled - no meds  ? HSV infection   ? Liver mass 07/2021  ? Osteoarthritis   ? Prolonged Q-T interval on ECG 02/2021  ? Sinusitis 12/31/2012  ? Wears glasses   ? ?Past Surgical History:  ?Procedure Laterality Date  ? BIOPSY  08/24/2021  ? Procedure: BIOPSY;  Surgeon: Irving Copas., MD;  Location: [place];  Service: Gastroenterology;;  ? BIOPSY  09/11/2021  ? Procedure: BIOPSY;  Surgeon: Irving Copas., MD;  Location: [facility];  Service: Gastroenterology;;  ? ESOPHAGOGASTRODUODENOSCOPY (EGD) WITH PROPOFOL N/A 08/11/2021  ? Procedure: ESOPHAGOGASTRODUODENOSCOPY (EGD) WITH PROPOFOL;  Surgeon: Carol Ada, MD;  Location: [facility];  Service: Endoscopy;  Laterality: N/A;  ? ESOPHAGOGASTRODUODENOSCOPY (EGD) WITH PROPOFOL N/A 08/24/2021  ? Procedure: ESOPHAGOGASTRODUODENOSCOPY (EGD) WITH PROPOFOL;  Surgeon: Rush Landmark Telford Nab., MD;  Location: [place];  Service: Gastroenterology;  Laterality: N/A;  ? ESOPHAGOGASTRODUODENOSCOPY (EGD) WITH PROPOFOL  N/A 09/11/2021  ? Procedure: ESOPHAGOGASTRODUODENOSCOPY (EGD) WITH PROPOFOL;  Surgeon: Rush Landmark Telford Nab., MD;  Location: [facility];  Service: Gastroenterology;  Laterality: N/A;  ? EUS N/A 08/24/2021  ? Procedure: UPPER ENDOSCOPIC ULTRASOUND (EUS) LINEAR;  Surgeon: Rush Landmark Telford Nab., MD;  Location: [place];  Service: Gastroenterology;  Laterality: N/A;  ? EUS N/A 09/11/2021  ? Procedure: UPPER ENDOSCOPIC ULTRASOUND (EUS) LINEAR;  Surgeon: Rush Landmark Telford Nab., MD;  Location: [facility];  Service: Gastroenterology;  Laterality: N/A;  ? FINE NEEDLE ASPIRATION  08/24/2021  ? Procedure: FINE NEEDLE ASPIRATION;  Surgeon: Rush Landmark Telford Nab., MD;  Location: [place];  Service: Gastroenterology;;  ? FINE NEEDLE ASPIRATION  09/11/2021  ? Procedure: FINE NEEDLE ASPIRATION (FNA) LINEAR;  Surgeon: Rush Landmark Telford Nab., MD;  Location: [facility];  Service: Gastroenterology;;  Delaine Lame. not in system to charge  ? TONSILLECTOMY  04/08/57  ? some date around then  ? TUBAL LIGATION  10/21/1984  ? UPPER ESOPHAGEAL ENDOSCOPIC ULTRASOUND (EUS) N/A 08/11/2021  ? Procedure: UPPER ESOPHAGEAL ENDOSCOPIC ULTRASOUND (EUS);  Surgeon: Carol Ada, MD;  Location: [facility];  Service: Endoscopy;  Laterality: N/A;  ? ?Patient Active Problem List  ? Diagnosis Date Noted  ? Back pain, chronic 04/03/2013  ? Anxiety 12/17/2012  ? Vitamin D insufficiency 12/17/2012  ? GERD (gastroesophageal reflux disease) 11/18/2012  ? Bipolar 1 disorder ([place]) 11/18/2012  ? Fibromyalgia 11/18/2012  ? ? ?REFERRING DIAG: weakness of both LE and UE  weakness ? ?THERAPY DIAG:  ?Muscle weakness (generalized) ? ?Low back pain, unspecified back pain laterality, unspecified chronicity, unspecified whether sciatica present ? ?Other abnormalities of gait and mobility ? ?History of falling ? ?Difficulty in walking, not elsewhere classified ? ?PERTINENT HISTORY: arthritis, fibromyalgia, anxiety, chronic LBP ? ?PRECAUTIONS:  fall ? ?SUBJECTIVE: Patient reports that the exercises have been going well at home, but she has been experiencing her usual whole body soreness that sometimes prevents her from wanting to perform activities. ? ?PAIN:  ?Are you having pain?  Chronic ache ? ? ?OBJECTIVE:  ?  ?TODAY'S TREATMENT: ?04/04/22: ?Aerobic: ?-NuStep + arms (lvl5 / 30-50SPM) x61mn ?Seated: ?-Vertical grip row(body craft) #1 4x12 ?-Bench press (body craft) #1 3x8 ?-Lat pull down #1 3x8 ? ? ?03/28/22  ?Heel raise 10X ?Toe raise 10X ?Standing march 10X ?Tandem stance 30" X2 (10" max either LE lead) ?Sit to stands 10X from standard height no UE's ?  ?03/21/22  ?Heel raise  ?Toe raise  ?Standing march   ?  ?  ?PATIENT EDUCATION:  ?Education details: goal, HEP review and POC moving forward ?Person educated: Patient ?Education method: Explanation and Demonstration ?Education comprehension: verbalized understanding and returned demonstration ?  ?  ?HOME EXERCISE PROGRAM: ?Access Code: [email or web address] ?URL: [email or web address] ?Date: 03/21/2022 ?Prepared by: CJosue Hector?  ?Exercises ?Heel Raises with Counter Support - 3 x daily - 7 x weekly - 2 sets - 10 reps ?Toe Raises with Counter Support - 3 x daily - 7 x weekly - 2 sets - 10 reps ?Standing March with Counter Support - 3 x daily - 7 x weekly - 2 sets - 10 reps ?  ?  ?ASSESSMENT: ?  ?CLINICAL IMPRESSION: ?Patient tolerated treatment well during today's session, but did experience fatigue by the end. She was able to perform all exercises independently except for the bench press exercise for which she would require assistance after the 3rd rep in each set.  ?  ?OBJECTIVE IMPAIRMENTS Abnormal gait, decreased activity tolerance, decreased balance, decreased endurance, decreased mobility, difficulty walking, decreased strength, improper body mechanics, and pain.  ?  ?ACTIVITY LIMITATIONS cleaning, community activity, meal prep, laundry, yard work, shopping, and yard work.  ?  ?PERSONAL FACTORS  Past/current experiences and Time since onset of injury/illness/exacerbation are also affecting patient's functional outcome.  ?  ?  ?REHAB POTENTIAL: Fair chronic nature/ history  ?  ?CLINICAL DECISION MAKING: Stable/uncomplicated ?  ?EVALUATION COMPLEXITY: Low ?  ?  ?GOALS: ?  ?  ?SHORT TERM GOALS: Target date: 04/04/2022 ?  ? Patient will be independent with initial HEP and self-management strategies to improve functional outcomes ?Baseline:  ?Goal status: ongoing ?  ?LONG TERM GOALS: Target date: 05/02/22 ?  ?Patient will be independent with advanced HEP and self-management strategies to improve functional outcomes ?Baseline:  ?Goal status: ongoing ?  ?2. Patient will be able to perform stand x 5 in < 15 seconds to demonstrate improvement in functional mobility and reduced risk for falls.  ?Baseline:  ?Goal status: ongoing ?  ?3.  Patient will report reduction of back pain to <3/10 at rest for improved quality of life and ability to perform ADLs  ?Baseline:  ?Goal status: ongoing ?  ?4. Patient will be able to maintain tandem stance >30 seconds on BLEs to improve stability and reduce risk for falls ?Baseline:  ?Goal status: ongoing ?  ?5. Patient will be able to ambulate at least 325 feet during 2MWT with LRAD to demonstrate improved ability to perform functional  mobility and associated tasks. ?Baseline:  ?Goal status: INITIAL ?  ?PLAN: ?PT FREQUENCY: 2x/week ?  ?PT DURATION: 6 weeks ?  ?PLANNED INTERVENTIONS: Therapeutic exercises, Therapeutic activity, Neuromuscular re-education, Balance training, Gait training, Patient/Family education, Joint manipulation, Joint mobilization, Stair training, Aquatic Therapy, Dry Needling, Electrical stimulation, Spinal manipulation, Spinal mobilization, Cryotherapy, Moist heat, scar mobilization, Taping, Traction, Ultrasound, Biofeedback, Ionotophoresis '4mg'$ /ml Dexamethasone, and Manual therapy. ?  ?  ?PLAN FOR NEXT SESSION: Progress functional LE strength, balance, gait and  stairs. Begin tandem stance and additional standing exericses. ? ? ?Adalberto Cole, PT ?04/04/2022, 3:22 PM ? ?   ?

## 2022-04-06 ENCOUNTER — Ambulatory Visit (HOSPITAL_COMMUNITY): Payer: Medicare Other

## 2022-04-06 ENCOUNTER — Encounter (HOSPITAL_COMMUNITY): Payer: Self-pay

## 2022-04-06 DIAGNOSIS — M545 Low back pain, unspecified: Secondary | ICD-10-CM

## 2022-04-06 DIAGNOSIS — M6281 Muscle weakness (generalized): Secondary | ICD-10-CM

## 2022-04-06 NOTE — Therapy (Signed)
?OUTPATIENT PHYSICAL THERAPY TREATMENT NOTE ? ? ?Patient Name: Katherine Middleton ?MRN: [id number] ?DOB:1951/05/25, 71 y.o., female ?Today's Date: 04/06/2022 ? ?PCP: Aletha Halim., PA-C ?REFERRING PROVIDER: Aletha Halim., PA-C ? ?END OF SESSION:  ? PT End of Session - 04/06/22 1737   ? ? Visit Number 4   ? Number of Visits 12   ? Date for PT Re-Evaluation 05/02/22   ? Authorization Type UHC   ? Authorization Time Period No auth required, no visit limit   ? Progress Note Due on Visit 10   ? PT Start Time 8756   ? PT Stop Time 1750   ? PT Time Calculation (min) 45 min   ? Equipment Utilized During Treatment --   SBA  ? Activity Tolerance Patient tolerated treatment well;Patient limited by fatigue   ? Behavior During Therapy [facility] for tasks assessed/performed   ? ?  ?  ? ?  ? ? ? ?Past Medical History:  ?Diagnosis Date  ? Anxiety   ? Arthritis   ? Bipolar disorder ([place])   ? Depression   ? Fibromyalgia   ? GERD (gastroesophageal reflux disease) 12/31/2002  ? HLD (hyperlipidemia)   ? diet controlled - no meds  ? HSV infection   ? Liver mass 07/2021  ? Osteoarthritis   ? Prolonged Q-T interval on ECG 02/2021  ? Sinusitis 12/31/2012  ? Wears glasses   ? ?Past Surgical History:  ?Procedure Laterality Date  ? BIOPSY  08/24/2021  ? Procedure: BIOPSY;  Surgeon: Irving Copas., MD;  Location: [place];  Service: Gastroenterology;;  ? BIOPSY  09/11/2021  ? Procedure: BIOPSY;  Surgeon: Irving Copas., MD;  Location: [facility];  Service: Gastroenterology;;  ? ESOPHAGOGASTRODUODENOSCOPY (EGD) WITH PROPOFOL N/A 08/11/2021  ? Procedure: ESOPHAGOGASTRODUODENOSCOPY (EGD) WITH PROPOFOL;  Surgeon: Carol Ada, MD;  Location: [facility];  Service: Endoscopy;  Laterality: N/A;  ? ESOPHAGOGASTRODUODENOSCOPY (EGD) WITH PROPOFOL N/A 08/24/2021  ? Procedure: ESOPHAGOGASTRODUODENOSCOPY (EGD) WITH PROPOFOL;  Surgeon: Rush Landmark Telford Nab., MD;  Location: [place];  Service: Gastroenterology;  Laterality: N/A;  ?  ESOPHAGOGASTRODUODENOSCOPY (EGD) WITH PROPOFOL N/A 09/11/2021  ? Procedure: ESOPHAGOGASTRODUODENOSCOPY (EGD) WITH PROPOFOL;  Surgeon: Rush Landmark Telford Nab., MD;  Location: [facility];  Service: Gastroenterology;  Laterality: N/A;  ? EUS N/A 08/24/2021  ? Procedure: UPPER ENDOSCOPIC ULTRASOUND (EUS) LINEAR;  Surgeon: Rush Landmark Telford Nab., MD;  Location: [place];  Service: Gastroenterology;  Laterality: N/A;  ? EUS N/A 09/11/2021  ? Procedure: UPPER ENDOSCOPIC ULTRASOUND (EUS) LINEAR;  Surgeon: Rush Landmark Telford Nab., MD;  Location: [facility];  Service: Gastroenterology;  Laterality: N/A;  ? FINE NEEDLE ASPIRATION  08/24/2021  ? Procedure: FINE NEEDLE ASPIRATION;  Surgeon: Rush Landmark Telford Nab., MD;  Location: [place];  Service: Gastroenterology;;  ? FINE NEEDLE ASPIRATION  09/11/2021  ? Procedure: FINE NEEDLE ASPIRATION (FNA) LINEAR;  Surgeon: Rush Landmark Telford Nab., MD;  Location: [facility];  Service: Gastroenterology;;  Delaine Lame. not in system to charge  ? TONSILLECTOMY  04/08/57  ? some date around then  ? TUBAL LIGATION  10/21/1984  ? UPPER ESOPHAGEAL ENDOSCOPIC ULTRASOUND (EUS) N/A 08/11/2021  ? Procedure: UPPER ESOPHAGEAL ENDOSCOPIC ULTRASOUND (EUS);  Surgeon: Carol Ada, MD;  Location: [facility];  Service: Endoscopy;  Laterality: N/A;  ? ?Patient Active Problem List  ? Diagnosis Date Noted  ? Back pain, chronic 04/03/2013  ? Anxiety 12/17/2012  ? Vitamin D insufficiency 12/17/2012  ? GERD (gastroesophageal reflux disease) 11/18/2012  ? Bipolar 1 disorder ([place]) 11/18/2012  ? Fibromyalgia  11/18/2012  ? ? ?REFERRING DIAG: weakness of both LE and UE weakness ? ?THERAPY DIAG:  ?Muscle weakness (generalized) ? ?Low back pain, unspecified back pain laterality, unspecified chronicity, unspecified whether sciatica present ? ?PERTINENT HISTORY: arthritis, fibromyalgia, anxiety, chronic LBP ? ?PRECAUTIONS: fall ? ?SUBJECTIVE: Pt stated she is sore following session on Wednesday, pain scale  5-6/10 constant pain. ? ?PAIN:  ?PAIN:  ?Are you having pain? Yes: NPRS scale: 5-6/10 ?Pain location: trunk and back ?Pain description: soreness ?Aggravating factors: unsure ?Relieving factors: heat ? ? ? ?OBJECTIVE:  ?  ?TODAY'S TREATMENT: ?4/723: ?Standing march 10X 5" holds no HHA ?Tandem stance 2x 30" on foam ?SLS Lt 16", Rt 18" ?Cyber walk out sidestep and retro/forward 2Pl 3RT each ?Squat to heel raises with minimal UE support 10x 5"  ?Lunge on 6 in step no HHA 10x 3-5" ? ?  ?04/04/22: ?Aerobic: ?-NuStep + arms (lvl5 / 30-50SPM) x44mn ?Seated: ?-Vertical grip row(body craft) #1 4x12 ?-Bench press (body craft) #1 3x8 ?-Lat pull down #1 3x8 ? ? ?03/28/22  ?Heel raise 10X ?Toe raise 10X ?Standing march 10X ?Tandem stance 30" X2 (10" max either LE lead) ?Sit to stands 10X from standard height no UE's ?  ?03/21/22  ?Heel raise  ?Toe raise  ?Standing march   ?  ?  ?PATIENT EDUCATION:  ?Education details: goal, HEP review and POC moving forward ?Person educated: Patient ?Education method: Explanation and Demonstration ?Education comprehension: verbalized understanding and returned demonstration ?  ?  ?HOME EXERCISE PROGRAM: ?Access Code: [email or web address] ?URL: [email or web address] ?Date: 03/21/2022 ?Prepared by: CJosue Hector?  ?Exercises ?Heel Raises with Counter Support - 3 x daily - 7 x weekly - 2 sets - 10 reps ?Toe Raises with Counter Support - 3 x daily - 7 x weekly - 2 sets - 10 reps ?Standing March with Counter Support - 3 x daily - 7 x weekly - 2 sets - 10 reps ? ?Tandem stance 30x " ?  ?  ?ASSESSMENT: ?  ?CLINICAL IMPRESSION: ?Pt improving activity tolerance, 1 seated rest break through session.  Session focus with LE strengthening and dynamic balance training.  Improved static SLS BLE.   ?  ?OBJECTIVE IMPAIRMENTS Abnormal gait, decreased activity tolerance, decreased balance, decreased endurance, decreased mobility, difficulty walking, decreased strength, improper body mechanics, and pain.  ?   ?ACTIVITY LIMITATIONS cleaning, community activity, meal prep, laundry, yard work, shopping, and yard work.  ?  ?PERSONAL FACTORS Past/current experiences and Time since onset of injury/illness/exacerbation are also affecting patient's functional outcome.  ?  ?  ?REHAB POTENTIAL: Fair chronic nature/ history  ?  ?CLINICAL DECISION MAKING: Stable/uncomplicated ?  ?EVALUATION COMPLEXITY: Low ?  ?  ?GOALS: ?  ?  ?SHORT TERM GOALS: Target date: 04/04/2022 ?  ? Patient will be independent with initial HEP and self-management strategies to improve functional outcomes ?Baseline:  ?Goal status: ongoing ?  ?LONG TERM GOALS: Target date: 05/02/22 ?  ?Patient will be independent with advanced HEP and self-management strategies to improve functional outcomes ?Baseline:  ?Goal status: ongoing ?  ?2. Patient will be able to perform stand x 5 in < 15 seconds to demonstrate improvement in functional mobility and reduced risk for falls.  ?Baseline:  ?Goal status: ongoing ?  ?3.  Patient will report reduction of back pain to <3/10 at rest for improved quality of life and ability to perform ADLs  ?Baseline:  ?Goal status: ongoing ?  ?4. Patient will be able to maintain tandem stance >30 seconds on BLEs  to improve stability and reduce risk for falls ?Baseline:  ?Goal status: ongoing ?  ?5. Patient will be able to ambulate at least 325 feet during 2MWT with LRAD to demonstrate improved ability to perform functional mobility and associated tasks. ?Baseline:  ?Goal status: INITIAL ?  ?PLAN: ?PT FREQUENCY: 2x/week ?  ?PT DURATION: 6 weeks ?  ?PLANNED INTERVENTIONS: Therapeutic exercises, Therapeutic activity, Neuromuscular re-education, Balance training, Gait training, Patient/Family education, Joint manipulation, Joint mobilization, Stair training, Aquatic Therapy, Dry Needling, Electrical stimulation, Spinal manipulation, Spinal mobilization, Cryotherapy, Moist heat, scar mobilization, Taping, Traction, Ultrasound, Biofeedback,  Ionotophoresis '4mg'$ /ml Dexamethasone, and Manual therapy. ?  ?  ?PLAN FOR NEXT SESSION: Progress functional LE strength, balance, gait and stairs. Add vector stance with HHA ? ?Ihor Austin, LPTA/CLT; CBIS ?[phone]

## 2022-04-10 ENCOUNTER — Ambulatory Visit (HOSPITAL_COMMUNITY): Payer: Medicare Other | Admitting: Physical Therapy

## 2022-04-10 DIAGNOSIS — R2689 Other abnormalities of gait and mobility: Secondary | ICD-10-CM

## 2022-04-10 DIAGNOSIS — M6281 Muscle weakness (generalized): Secondary | ICD-10-CM | POA: Diagnosis not present

## 2022-04-10 DIAGNOSIS — Z9181 History of falling: Secondary | ICD-10-CM

## 2022-04-10 DIAGNOSIS — M545 Low back pain, unspecified: Secondary | ICD-10-CM

## 2022-04-10 NOTE — Therapy (Signed)
?OUTPATIENT PHYSICAL THERAPY TREATMENT NOTE ? ? ?Patient Name: Katherine Middleton ?MRN: [id number] ?DOB:14-Aug-1951, 71 y.o., female ?Today's Date: 04/10/2022 ? ?PCP: Aletha Halim., PA-C ?REFERRING PROVIDER: Aletha Halim., PA-C ? ?END OF SESSION:  ? PT End of Session - 04/10/22 1409   ? ? Visit Number 5   ? Number of Visits 12   ? Date for PT Re-Evaluation 05/02/22   ? Authorization Type UHC   ? Authorization Time Period No auth required, no visit limit   ? Progress Note Due on Visit 10   ? PT Start Time 1405   ? PT Stop Time 9622   ? PT Time Calculation (min) 40 min   ? Equipment Utilized During Treatment --   SBA  ? Activity Tolerance Patient tolerated treatment well;Patient limited by fatigue   ? Behavior During Therapy [facility] for tasks assessed/performed   ? ?  ?  ? ?  ? ? ? ?Past Medical History:  ?Diagnosis Date  ? Anxiety   ? Arthritis   ? Bipolar disorder ([place])   ? Depression   ? Fibromyalgia   ? GERD (gastroesophageal reflux disease) 12/31/2002  ? HLD (hyperlipidemia)   ? diet controlled - no meds  ? HSV infection   ? Liver mass 07/2021  ? Osteoarthritis   ? Prolonged Q-T interval on ECG 02/2021  ? Sinusitis 12/31/2012  ? Wears glasses   ? ?Past Surgical History:  ?Procedure Laterality Date  ? BIOPSY  08/24/2021  ? Procedure: BIOPSY;  Surgeon: Irving Copas., MD;  Location: [place];  Service: Gastroenterology;;  ? BIOPSY  09/11/2021  ? Procedure: BIOPSY;  Surgeon: Irving Copas., MD;  Location: [facility];  Service: Gastroenterology;;  ? ESOPHAGOGASTRODUODENOSCOPY (EGD) WITH PROPOFOL N/A 08/11/2021  ? Procedure: ESOPHAGOGASTRODUODENOSCOPY (EGD) WITH PROPOFOL;  Surgeon: Carol Ada, MD;  [facility]: [facility];  Service: Endoscopy;  Laterality: N/A;  ? ESOPHAGOGASTRODUODENOSCOPY (EGD) WITH PROPOFOL N/A 08/24/2021  ? Procedure: ESOPHAGOGASTRODUODENOSCOPY (EGD) WITH PROPOFOL;  Surgeon: Rush Landmark Telford Nab., MD;  Location: [place];  Service: Gastroenterology;  Laterality: N/A;   ? ESOPHAGOGASTRODUODENOSCOPY (EGD) WITH PROPOFOL N/A 09/11/2021  ? Procedure: ESOPHAGOGASTRODUODENOSCOPY (EGD) WITH PROPOFOL;  Surgeon: Rush Landmark Telford Nab., MD;  Location: [facility];  Service: Gastroenterology;  Laterality: N/A;  ? EUS N/A 08/24/2021  ? Procedure: UPPER ENDOSCOPIC ULTRASOUND (EUS) LINEAR;  Surgeon: Rush Landmark Telford Nab., MD;  Location: [place];  Service: Gastroenterology;  Laterality: N/A;  ? EUS N/A 09/11/2021  ? Procedure: UPPER ENDOSCOPIC ULTRASOUND (EUS) LINEAR;  Surgeon: Rush Landmark Telford Nab., MD;  Location: [facility];  Service: Gastroenterology;  Laterality: N/A;  ? FINE NEEDLE ASPIRATION  08/24/2021  ? Procedure: FINE NEEDLE ASPIRATION;  Surgeon: Rush Landmark Telford Nab., MD;  Location: [place];  Service: Gastroenterology;;  ? FINE NEEDLE ASPIRATION  09/11/2021  ? Procedure: FINE NEEDLE ASPIRATION (FNA) LINEAR;  Surgeon: Rush Landmark Telford Nab., MD;  Location: [facility];  Service: Gastroenterology;;  Delaine Lame. not in system to charge  ? TONSILLECTOMY  04/08/57  ? some date around then  ? TUBAL LIGATION  10/21/1984  ? UPPER ESOPHAGEAL ENDOSCOPIC ULTRASOUND (EUS) N/A 08/11/2021  ? Procedure: UPPER ESOPHAGEAL ENDOSCOPIC ULTRASOUND (EUS);  Surgeon: Carol Ada, MD;  Location: [facility];  Service: Endoscopy;  Laterality: N/A;  ? ?Patient Active Problem List  ? Diagnosis Date Noted  ? Back pain, chronic 04/03/2013  ? Anxiety 12/17/2012  ? Vitamin D insufficiency 12/17/2012  ? GERD (gastroesophageal reflux disease) 11/18/2012  ? Bipolar 1 disorder ([place]) 11/18/2012  ? Fibromyalgia  11/18/2012  ? ? ?REFERRING DIAG: weakness of both LE and UE weakness ? ?THERAPY DIAG:  ?Muscle weakness (generalized) ? ?Low back pain, unspecified back pain laterality, unspecified chronicity, unspecified whether sciatica present ? ?Other abnormalities of gait and mobility ? ?History of falling ? ?PERTINENT HISTORY: arthritis, fibromyalgia, anxiety, chronic LBP ? ?PRECAUTIONS:  fall ? ?SUBJECTIVE: Pt states her Rt knee hurts a little at 5/10.  States she had a good easter weekend. ? ?PAIN:  ?Are you having pain? Yes: NPRS scale: 5/10 ?Pain location: Rt knee ?Pain description: soreness ?Aggravating factors: unsure ?Relieving factors: Rest ? ? ? ?OBJECTIVE:  ?  ?TODAY'S TREATMENT: ? ?04/10/22: ?Heel raise and toe raise on incline 15 reps ?Standing march 15X 5" holds no HHA ?Tandem stance 2x 30" on foam ?SLS Lt 17", Rt 12" ?Vector stance 10X5"  ?Lunge on 4 in step no HHA 15x  ? ? ?4/723: ?Standing march 10X 5" holds no HHA ?Tandem stance 2x 30" on foam ?SLS Lt 16", Rt 18" ?Cyber walk out sidestep and retro/forward 2Pl 3RT each ?Squat to heel raises with minimal UE support 10x 5"  ?Lunge on 6 in step no HHA 10x 3-5" ? ?  ?04/04/22: ?Aerobic: ?-NuStep + arms (lvl5 / 30-50SPM) x58mn ?Seated: ?-Vertical grip row(body craft) #1 4x12 ?-Bench press (body craft) #1 3x8 ?-Lat pull down #1 3x8 ? ? ?03/28/22  ?Heel raise 10X ?Toe raise 10X ?Standing march 10X ?Tandem stance 30" X2 (10" max either LE lead) ?Sit to stands 10X from standard height no UE's ?  ?03/21/22  ?Heel raise  ?Toe raise  ?Standing march   ?  ?  ?PATIENT EDUCATION:  ?Education details: goal, HEP review and POC moving forward ?Person educated: Patient ?Education method: Explanation and Demonstration ?Education comprehension: verbalized understanding and returned demonstration ?  ?  ?HOME EXERCISE PROGRAM: ?Access Code: [email or web address] ?URL: [email or web address] ?Date: 03/21/2022 ?Prepared by: CJosue Hector?  ?Exercises ?Heel Raises with Counter Support - 3 x daily - 7 x weekly - 2 sets - 10 reps ?Toe Raises with Counter Support - 3 x daily - 7 x weekly - 2 sets - 10 reps ?Standing March with Counter Support - 3 x daily - 7 x weekly - 2 sets - 10 reps ? ?Tandem stance 30x " ?  ?  ?ASSESSMENT: ?  ?CLINICAL IMPRESSION: ?Continued with LE strengthening and balance activities.  Began vectors to help improve single leg stance with use  of 1 UE assist.  Pt required 2 seated rest breaks this session. Reduced to 4" step for forward lunges without UE assist.  Pt with one LOB but able to self correct during activity.  Pt will continue to benefit from skilled physical therapy. ?  ?OBJECTIVE IMPAIRMENTS Abnormal gait, decreased activity tolerance, decreased balance, decreased endurance, decreased mobility, difficulty walking, decreased strength, improper body mechanics, and pain.  ?  ?ACTIVITY LIMITATIONS cleaning, community activity, meal prep, laundry, yard work, shopping, and yard work.  ?  ?  ?GOALS: ?  ?  ?SHORT TERM GOALS: Target date: 04/04/2022 ?  ? Patient will be independent with initial HEP and self-management strategies to improve functional outcomes ?Baseline:  ?Goal status: ongoing ?  ?LONG TERM GOALS: Target date: 05/02/22 ?  ?Patient will be independent with advanced HEP and self-management strategies to improve functional outcomes ?Baseline:  ?Goal status: ongoing ?  ?2. Patient will be able to perform stand x 5 in < 15 seconds to demonstrate improvement in functional mobility and reduced risk for falls.  ?  Baseline:  ?Goal status: ongoing ?  ?3.  Patient will report reduction of back pain to <3/10 at rest for improved quality of life and ability to perform ADLs  ?Baseline:  ?Goal status: ongoing ?  ?4. Patient will be able to maintain tandem stance >30 seconds on BLEs to improve stability and reduce risk for falls ?Baseline:  ?Goal status: ongoing ?  ?5. Patient will be able to ambulate at least 325 feet during 2MWT with LRAD to demonstrate improved ability to perform functional mobility and associated tasks. ?Baseline:  ?Goal status: ongoing ? ? ?PLAN: ?PT FREQUENCY: 2x/week ?  ?PT DURATION: 6 weeks ?  ?PLANNED INTERVENTIONS: Therapeutic exercises, Therapeutic activity, Neuromuscular re-education, Balance training, Gait training, Patient/Family education, Joint manipulation, Joint mobilization, Stair training, Aquatic Therapy, Dry Needling,  Electrical stimulation, Spinal manipulation, Spinal mobilization, Cryotherapy, Moist heat, scar mobilization, Taping, Traction, Ultrasound, Biofeedback, Ionotophoresis '4mg'$ /ml Dexamethasone, and Manual therapy. ?  ?  ?

## 2022-04-12 ENCOUNTER — Encounter (HOSPITAL_COMMUNITY): Payer: Self-pay | Admitting: Physical Therapy

## 2022-04-12 ENCOUNTER — Ambulatory Visit (HOSPITAL_COMMUNITY): Payer: Medicare Other | Admitting: Physical Therapy

## 2022-04-12 DIAGNOSIS — M6281 Muscle weakness (generalized): Secondary | ICD-10-CM | POA: Diagnosis not present

## 2022-04-12 DIAGNOSIS — R2689 Other abnormalities of gait and mobility: Secondary | ICD-10-CM

## 2022-04-12 NOTE — Therapy (Signed)
?OUTPATIENT PHYSICAL THERAPY TREATMENT NOTE ? ? ?Patient Name: Katherine Middleton ?MRN: [id number] ?DOB:1951-03-10, 71 y.o., female ?Today's Date: 04/12/2022 ? ?PCP: Aletha Halim., PA-C ?REFERRING PROVIDER: Aletha Halim., PA-C ? ?END OF SESSION:  ? PT End of Session - 04/12/22 1441   ? ? Visit Number 6   ? Number of Visits 12   ? Date for PT Re-Evaluation 05/02/22   ? Authorization Type UHC   ? Authorization Time Period No auth required, no visit limit   ? Progress Note Due on Visit 10   ? PT Start Time 1441   ? PT Stop Time 6314   ? PT Time Calculation (min) 38 min   ? Equipment Utilized During Treatment --   SBA  ? Activity Tolerance Patient tolerated treatment well;Patient limited by fatigue   ? Behavior During Therapy [facility] for tasks assessed/performed   ? ?  ?  ? ?  ? ? ? ?Past Medical History:  ?Diagnosis Date  ? Anxiety   ? Arthritis   ? Bipolar disorder ([place])   ? Depression   ? Fibromyalgia   ? GERD (gastroesophageal reflux disease) 12/31/2002  ? HLD (hyperlipidemia)   ? diet controlled - no meds  ? HSV infection   ? Liver mass 07/2021  ? Osteoarthritis   ? Prolonged Q-T interval on ECG 02/2021  ? Sinusitis 12/31/2012  ? Wears glasses   ? ?Past Surgical History:  ?Procedure Laterality Date  ? BIOPSY  08/24/2021  ? Procedure: BIOPSY;  Surgeon: Irving Copas., MD;  Location: [place];  Service: Gastroenterology;;  ? BIOPSY  09/11/2021  ? Procedure: BIOPSY;  Surgeon: Irving Copas., MD;  Location: [facility];  Service: Gastroenterology;;  ? ESOPHAGOGASTRODUODENOSCOPY (EGD) WITH PROPOFOL N/A 08/11/2021  ? Procedure: ESOPHAGOGASTRODUODENOSCOPY (EGD) WITH PROPOFOL;  Surgeon: Carol Ada, MD;  Location: [facility];  Service: Endoscopy;  Laterality: N/A;  ? ESOPHAGOGASTRODUODENOSCOPY (EGD) WITH PROPOFOL N/A 08/24/2021  ? Procedure: ESOPHAGOGASTRODUODENOSCOPY (EGD) WITH PROPOFOL;  Surgeon: Rush Landmark Telford Nab., MD;  Location: [place];  Service: Gastroenterology;  Laterality: N/A;   ? ESOPHAGOGASTRODUODENOSCOPY (EGD) WITH PROPOFOL N/A 09/11/2021  ? Procedure: ESOPHAGOGASTRODUODENOSCOPY (EGD) WITH PROPOFOL;  Surgeon: Rush Landmark Telford Nab., MD;  Location: [facility];  Service: Gastroenterology;  Laterality: N/A;  ? EUS N/A 08/24/2021  ? Procedure: UPPER ENDOSCOPIC ULTRASOUND (EUS) LINEAR;  Surgeon: Rush Landmark Telford Nab., MD;  Location: [place];  Service: Gastroenterology;  Laterality: N/A;  ? EUS N/A 09/11/2021  ? Procedure: UPPER ENDOSCOPIC ULTRASOUND (EUS) LINEAR;  Surgeon: Rush Landmark Telford Nab., MD;  Location: [facility];  Service: Gastroenterology;  Laterality: N/A;  ? FINE NEEDLE ASPIRATION  08/24/2021  ? Procedure: FINE NEEDLE ASPIRATION;  Surgeon: Rush Landmark Telford Nab., MD;  Location: [place];  Service: Gastroenterology;;  ? FINE NEEDLE ASPIRATION  09/11/2021  ? Procedure: FINE NEEDLE ASPIRATION (FNA) LINEAR;  Surgeon: Rush Landmark Telford Nab., MD;  Location: [facility];  Service: Gastroenterology;;  Delaine Lame. not in system to charge  ? TONSILLECTOMY  04/08/57  ? some date around then  ? TUBAL LIGATION  10/21/1984  ? UPPER ESOPHAGEAL ENDOSCOPIC ULTRASOUND (EUS) N/A 08/11/2021  ? Procedure: UPPER ESOPHAGEAL ENDOSCOPIC ULTRASOUND (EUS);  Surgeon: Carol Ada, MD;  Location: [facility];  Service: Endoscopy;  Laterality: N/A;  ? ?Patient Active Problem List  ? Diagnosis Date Noted  ? Back pain, chronic 04/03/2013  ? Anxiety 12/17/2012  ? Vitamin D insufficiency 12/17/2012  ? GERD (gastroesophageal reflux disease) 11/18/2012  ? Bipolar 1 disorder ([place]) 11/18/2012  ? Fibromyalgia  11/18/2012  ? ? ?REFERRING DIAG: weakness of both LE and UE weakness ? ?THERAPY DIAG:  ?Muscle weakness (generalized) ? ?Other abnormalities of gait and mobility ? ?PERTINENT HISTORY: arthritis, fibromyalgia, anxiety, chronic LBP ? ?PRECAUTIONS: fall ? ?SUBJECTIVE: Has been doing alright, no falls. Had a busy day so far. ? ?PAIN:  ?Are you having pain? Yes: NPRS scale: 5/10 ?Pain location: Rt  knee ?Pain description: soreness ?Aggravating factors: unsure ?Relieving factors: Rest ? ? ? ?OBJECTIVE:  ?  ?TODAY'S TREATMENT: ?04/12/22 ?Heel raise and toe raise on incline 15 reps ?Step up 6 inch step 2x 10 bilateral ?Retro weighted walkout 1x 5 2 plates ?STS 2x 10  ?Row green band 1x20  ?LAQ 1x 10 3 second holds bilateral ? ?04/10/22: ?Heel raise and toe raise on incline 15 reps ?Standing march 15X 5" holds no HHA ?Tandem stance 2x 30" on foam ?SLS Lt 17", Rt 12" ?Vector stance 10X5"  ?Lunge on 4 in step no HHA 15x  ? ? ?4/723: ?Standing march 10X 5" holds no HHA ?Tandem stance 2x 30" on foam ?SLS Lt 16", Rt 18" ?Cyber walk out sidestep and retro/forward 2Pl 3RT each ?Squat to heel raises with minimal UE support 10x 5"  ?Lunge on 6 in step no HHA 10x 3-5" ? ?  ?04/04/22: ?Aerobic: ?-NuStep + arms (lvl5 / 30-50SPM) x73mn ?Seated: ?-Vertical grip row(body craft) #1 4x12 ?-Bench press (body craft) #1 3x8 ?-Lat pull down #1 3x8 ? ? ?03/28/22  ?Heel raise 10X ?Toe raise 10X ?Standing march 10X ?Tandem stance 30" X2 (10" max either LE lead) ?Sit to stands 10X from standard height no UE's ?  ?03/21/22  ?Heel raise  ?Toe raise  ?Standing march   ?  ?  ?PATIENT EDUCATION:  ?Education details: HEP ?Person educated: Patient ?Education method: Explanation and Demonstration ?Education comprehension: verbalized understanding and returned demonstration ?  ?  ?HOME EXERCISE PROGRAM: ?Access Code: [email or web address] ?URL: [email or web address] ?Date: 03/21/2022 ?Prepared by: CJosue Hector?  ?Exercises ?Heel Raises with Counter Support - 3 x daily - 7 x weekly - 2 sets - 10 reps ?Toe Raises with Counter Support - 3 x daily - 7 x weekly - 2 sets - 10 reps ?Standing March with Counter Support - 3 x daily - 7 x weekly - 2 sets - 10 reps ? ?Tandem stance 30x " ?  ?  ?ASSESSMENT: ?  ?CLINICAL IMPRESSION: ?Continued with functional strengthening and balance training which is tolerated well. She requires intermittent cueing for  mechanics and sequencing. Patient stating moderate to high fatigue at end of session. Patient will continue to benefit from physical therapy in order to improve function and reduce impairment. ? ?  ?OBJECTIVE IMPAIRMENTS Abnormal gait, decreased activity tolerance, decreased balance, decreased endurance, decreased mobility, difficulty walking, decreased strength, improper body mechanics, and pain.  ?  ?ACTIVITY LIMITATIONS cleaning, community activity, meal prep, laundry, yard work, shopping, and yard work.  ?  ?  ?GOALS: ?  ?  ?SHORT TERM GOALS: Target date: 04/04/2022 ?  ? Patient will be independent with initial HEP and self-management strategies to improve functional outcomes ?Baseline:  ?Goal status: ongoing ?  ?LONG TERM GOALS: Target date: 05/02/22 ?  ?Patient will be independent with advanced HEP and self-management strategies to improve functional outcomes ?Baseline:  ?Goal status: ongoing ?  ?2. Patient will be able to perform stand x 5 in < 15 seconds to demonstrate improvement in functional mobility and reduced risk for falls.  ?Baseline:  ?Goal status: ongoing ?  ?  3.  Patient will report reduction of back pain to <3/10 at rest for improved quality of life and ability to perform ADLs  ?Baseline:  ?Goal status: ongoing ?  ?4. Patient will be able to maintain tandem stance >30 seconds on BLEs to improve stability and reduce risk for falls ?Baseline:  ?Goal status: ongoing ?  ?5. Patient will be able to ambulate at least 325 feet during 2MWT with LRAD to demonstrate improved ability to perform functional mobility and associated tasks. ?Baseline:  ?Goal status: ongoing ? ? ?PLAN: ?PT FREQUENCY: 2x/week ?  ?PT DURATION: 6 weeks ?  ?PLANNED INTERVENTIONS: Therapeutic exercises, Therapeutic activity, Neuromuscular re-education, Balance training, Gait training, Patient/Family education, Joint manipulation, Joint mobilization, Stair training, Aquatic Therapy, Dry Needling, Electrical stimulation, Spinal manipulation,  Spinal mobilization, Cryotherapy, Moist heat, scar mobilization, Taping, Traction, Ultrasound, Biofeedback, Ionotophoresis '4mg'$ /ml Dexamethasone, and Manual therapy. ?  ?  ?PLAN FOR NEXT SESSION: Progress func

## 2022-04-17 ENCOUNTER — Ambulatory Visit (HOSPITAL_COMMUNITY): Payer: Medicare Other | Admitting: Physical Therapy

## 2022-04-17 DIAGNOSIS — M545 Low back pain, unspecified: Secondary | ICD-10-CM

## 2022-04-17 DIAGNOSIS — R2689 Other abnormalities of gait and mobility: Secondary | ICD-10-CM

## 2022-04-17 DIAGNOSIS — M6281 Muscle weakness (generalized): Secondary | ICD-10-CM | POA: Diagnosis not present

## 2022-04-17 DIAGNOSIS — Z9181 History of falling: Secondary | ICD-10-CM

## 2022-04-17 NOTE — Therapy (Signed)
?OUTPATIENT PHYSICAL THERAPY TREATMENT NOTE ? ? ?Patient Name: Katherine Middleton ?MRN: [id number] ?DOB:1951/10/30, 71 y.o., female ?Today's Date: 04/17/2022 ? ?PCP: Aletha Halim., PA-C ?REFERRING PROVIDER: Aletha Halim., PA-C ? ?END OF SESSION:  ? PT End of Session - 04/17/22 1450   ? ? Visit Number 7   ? Number of Visits 12   ? Date for PT Re-Evaluation 05/02/22   ? Authorization Type UHC   ? Authorization Time Period No auth required, no visit limit   ? Progress Note Due on Visit 10   ? PT Start Time 1448   ? PT Stop Time 1528   ? PT Time Calculation (min) 40 min   ? Equipment Utilized During Treatment --   SBA  ? Activity Tolerance Patient tolerated treatment well;Patient limited by fatigue   ? Behavior During Therapy [facility] for tasks assessed/performed   ? ?  ?  ? ?  ? ? ? ?Past Medical History:  ?Diagnosis Date  ? Anxiety   ? Arthritis   ? Bipolar disorder ([place])   ? Depression   ? Fibromyalgia   ? GERD (gastroesophageal reflux disease) 12/31/2002  ? HLD (hyperlipidemia)   ? diet controlled - no meds  ? HSV infection   ? Liver mass 07/2021  ? Osteoarthritis   ? Prolonged Q-T interval on ECG 02/2021  ? Sinusitis 12/31/2012  ? Wears glasses   ? ?Past Surgical History:  ?Procedure Laterality Date  ? BIOPSY  08/24/2021  ? Procedure: BIOPSY;  Surgeon: Irving Copas., MD;  Location: [place];  Service: Gastroenterology;;  ? BIOPSY  09/11/2021  ? Procedure: BIOPSY;  Surgeon: Irving Copas., MD;  Location: [facility];  Service: Gastroenterology;;  ? ESOPHAGOGASTRODUODENOSCOPY (EGD) WITH PROPOFOL N/A 08/11/2021  ? Procedure: ESOPHAGOGASTRODUODENOSCOPY (EGD) WITH PROPOFOL;  Surgeon: Carol Ada, MD;  [facility]: [facility];  Service: Endoscopy;  Laterality: N/A;  ? ESOPHAGOGASTRODUODENOSCOPY (EGD) WITH PROPOFOL N/A 08/24/2021  ? Procedure: ESOPHAGOGASTRODUODENOSCOPY (EGD) WITH PROPOFOL;  Surgeon: Rush Landmark Telford Nab., MD;  Location: [place];  Service: Gastroenterology;  Laterality: N/A;   ? ESOPHAGOGASTRODUODENOSCOPY (EGD) WITH PROPOFOL N/A 09/11/2021  ? Procedure: ESOPHAGOGASTRODUODENOSCOPY (EGD) WITH PROPOFOL;  Surgeon: Rush Landmark Telford Nab., MD;  Location: [facility];  Service: Gastroenterology;  Laterality: N/A;  ? EUS N/A 08/24/2021  ? Procedure: UPPER ENDOSCOPIC ULTRASOUND (EUS) LINEAR;  Surgeon: Rush Landmark Telford Nab., MD;  Location: [place];  Service: Gastroenterology;  Laterality: N/A;  ? EUS N/A 09/11/2021  ? Procedure: UPPER ENDOSCOPIC ULTRASOUND (EUS) LINEAR;  Surgeon: Rush Landmark Telford Nab., MD;  Location: [facility];  Service: Gastroenterology;  Laterality: N/A;  ? FINE NEEDLE ASPIRATION  08/24/2021  ? Procedure: FINE NEEDLE ASPIRATION;  Surgeon: Rush Landmark Telford Nab., MD;  Location: [place];  Service: Gastroenterology;;  ? FINE NEEDLE ASPIRATION  09/11/2021  ? Procedure: FINE NEEDLE ASPIRATION (FNA) LINEAR;  Surgeon: Rush Landmark Telford Nab., MD;  Location: [facility];  Service: Gastroenterology;;  Delaine Lame. not in system to charge  ? TONSILLECTOMY  04/08/57  ? some date around then  ? TUBAL LIGATION  10/21/1984  ? UPPER ESOPHAGEAL ENDOSCOPIC ULTRASOUND (EUS) N/A 08/11/2021  ? Procedure: UPPER ESOPHAGEAL ENDOSCOPIC ULTRASOUND (EUS);  Surgeon: Carol Ada, MD;  Location: [facility];  Service: Endoscopy;  Laterality: N/A;  ? ?Patient Active Problem List  ? Diagnosis Date Noted  ? Back pain, chronic 04/03/2013  ? Anxiety 12/17/2012  ? Vitamin D insufficiency 12/17/2012  ? GERD (gastroesophageal reflux disease) 11/18/2012  ? Bipolar 1 disorder ([place]) 11/18/2012  ? Fibromyalgia  11/18/2012  ? ? ?REFERRING DIAG: weakness of both LE and UE weakness ? ?THERAPY DIAG:  ?Muscle weakness (generalized) ? ?Other abnormalities of gait and mobility ? ?Low back pain, unspecified back pain laterality, unspecified chronicity, unspecified whether sciatica present ? ?History of falling ? ?PERTINENT HISTORY: arthritis, fibromyalgia, anxiety, chronic LBP ? ?PRECAUTIONS:  fall ? ?SUBJECTIVE: Pt reports no issues currently. ? ?PAIN:  ?Are you having pain? Yes: NPRS scale: 0/10 ?Pain location:   ?Pain description:   ?Aggravating factors:   ?Relieving factors:   ? ? ? ?OBJECTIVE:  ?  ?TODAY'S TREATMENT: ? ?04/17/22 ?Heel raise and toe raise on incline 15 reps ?Step up 6 inch step 2x 10 bilateral with 1 HHA ?Lateral step ups 6" 2X10 bilateral with 1 HHA ?Retro weighted walkout 5X each retro, forward, Rt/Lt 2 plates ?STS 20X no UE's standard chair ?Row green band 20X ?Extensions green band 10X  ? ?04/12/22 ?Heel raise and toe raise on incline 15 reps ?Step up 6 inch step 2x 10 bilateral ?Retro weighted walkout 1x 5 2 plates ?STS 2x 10  ?Row green band 1x20  ?LAQ 1x 10 3 second holds bilateral ? ?04/10/22: ?Heel raise and toe raise on incline 15 reps ?Standing march 15X 5" holds no HHA ?Tandem stance 2x 30" on foam ?SLS Lt 17", Rt 12" ?Vector stance 10X5"  ?Lunge on 4 in step no HHA 15x  ? ? ?04/06/22: ?Standing march 10X 5" holds no HHA ?Tandem stance 2x 30" on foam ?SLS Lt 16", Rt 18" ?Cyber walk out sidestep and retro/forward 2Pl 3RT each ?Squat to heel raises with minimal UE support 10x 5"  ?Lunge on 6 in step no HHA 10x 3-5" ? ?  ?PATIENT EDUCATION:  ?Education details: HEP ?Person educated: Patient ?Education method: Explanation and Demonstration ?Education comprehension: verbalized understanding and returned demonstration ?  ?  ?HOME EXERCISE PROGRAM: ?Access Code: [email or web address] ?URL: [email or web address] ?Date: 03/21/2022 ?Prepared by: Josue Hector ?  ?Exercises ?Heel Raises with Counter Support - 3 x daily - 7 x weekly - 2 sets - 10 reps ?Toe Raises with Counter Support - 3 x daily - 7 x weekly - 2 sets - 10 reps ?Standing March with Counter Support - 3 x daily - 7 x weekly - 2 sets - 10 reps ? ?Tandem stance 30x " ?  ?  ?ASSESSMENT: ?  ?CLINICAL IMPRESSION: ?Focus continued on balance and functional strengthening for LE's.  Pt requires assist with keeping count of therex  and general form with most.  Pt able to complete all 20 sit to stands without rest break today.  Attempted to increase resisted walk 10# but was too much to control. Added shoulder extension with green theraband with cues for stab as tends to bend forward when pulling back.  Pt reported fatigue at end of session. Patient will continue to benefit from physical therapy in order to improve function and reduce impairment. ? ?  ?OBJECTIVE IMPAIRMENTS Abnormal gait, decreased activity tolerance, decreased balance, decreased endurance, decreased mobility, difficulty walking, decreased strength, improper body mechanics, and pain.  ?  ?ACTIVITY LIMITATIONS cleaning, community activity, meal prep, laundry, yard work, shopping, and yard work.  ?  ?  ?GOALS: ?  ?  ?SHORT TERM GOALS: Target date: 04/04/2022 ?  ? Patient will be independent with initial HEP and self-management strategies to improve functional outcomes ?Baseline:  ?Goal status: ongoing ?  ?LONG TERM GOALS: Target date: 05/02/22 ?  ?Patient will be independent with advanced HEP and self-management strategies to  improve functional outcomes ?Baseline:  ?Goal status: ongoing ?  ?2. Patient will be able to perform stand x 5 in < 15 seconds to demonstrate improvement in functional mobility and reduced risk for falls.  ?Baseline:  ?Goal status: ongoing ?  ?3.  Patient will report reduction of back pain to <3/10 at rest for improved quality of life and ability to perform ADLs  ?Baseline:  ?Goal status: ongoing ?  ?4. Patient will be able to maintain tandem stance >30 seconds on BLEs to improve stability and reduce risk for falls ?Baseline:  ?Goal status: ongoing ?  ?5. Patient will be able to ambulate at least 325 feet during 2MWT with LRAD to demonstrate improved ability to perform functional mobility and associated tasks. ?Baseline:  ?Goal status: ongoing ? ? ?PLAN: ?PT FREQUENCY: 2x/week ?  ?PT DURATION: 6 weeks ?  ?PLANNED INTERVENTIONS: Therapeutic exercises, Therapeutic  activity, Neuromuscular re-education, Balance training, Gait training, Patient/Family education, Joint manipulation, Joint mobilization, Stair training, Aquatic Therapy, Dry Needling, Electrical stimulation, Spinal ma

## 2022-04-19 ENCOUNTER — Encounter (HOSPITAL_COMMUNITY): Payer: Self-pay | Admitting: Psychiatry

## 2022-04-19 ENCOUNTER — Encounter (HOSPITAL_COMMUNITY): Payer: Self-pay

## 2022-04-19 ENCOUNTER — Ambulatory Visit (HOSPITAL_COMMUNITY): Payer: Medicare Other

## 2022-04-19 ENCOUNTER — Telehealth (INDEPENDENT_AMBULATORY_CARE_PROVIDER_SITE_OTHER): Payer: Medicare Other | Admitting: Psychiatry

## 2022-04-19 DIAGNOSIS — R2689 Other abnormalities of gait and mobility: Secondary | ICD-10-CM

## 2022-04-19 DIAGNOSIS — M6281 Muscle weakness (generalized): Secondary | ICD-10-CM | POA: Diagnosis not present

## 2022-04-19 DIAGNOSIS — F319 Bipolar disorder, unspecified: Secondary | ICD-10-CM

## 2022-04-19 MED ORDER — GABAPENTIN 300 MG PO CAPS: 600.0000 mg | ORAL_CAPSULE | Freq: Two times a day (BID) | ORAL | 2 refills | Status: DC

## 2022-04-19 MED ORDER — ALPRAZOLAM 0.5 MG PO TABS: 0.5000 mg | ORAL_TABLET | Freq: Four times a day (QID) | ORAL | 1 refills | Status: DC | PRN

## 2022-04-19 MED ORDER — PAROXETINE HCL 40 MG PO TABS: 40.0000 mg | ORAL_TABLET | ORAL | 2 refills | Status: DC

## 2022-04-19 MED ORDER — TRAZODONE HCL 150 MG PO TABS: 150.0000 mg | ORAL_TABLET | Freq: Every day | ORAL | 2 refills | Status: DC

## 2022-04-19 NOTE — Therapy (Signed)
?OUTPATIENT PHYSICAL THERAPY TREATMENT NOTE ? ? ?Patient Name: Katherine Middleton ?MRN: [id number] ?DOB:02/03/1951, 71 y.o., female ?Today's Date: 04/19/2022 ? ?PCP: Aletha Halim., PA-C ?REFERRING PROVIDER: Aletha Halim., PA-C ? ?END OF SESSION:  ? PT End of Session - 04/19/22 1614   ? ? Visit Number 8   ? Number of Visits 12   ? Date for PT Re-Evaluation 05/02/22   ? Authorization Type [employer]   ? Authorization Time Period No auth required, no visit limit   ? PT Start Time 1616   ? PT Stop Time 2482   ? PT Time Calculation (min) 42 min   ? Activity Tolerance Patient tolerated treatment well;Patient limited by fatigue   ? Behavior During Therapy [facility] for tasks assessed/performed   ? ?  ?  ? ?  ? ? ? ?Past Medical History:  ?Diagnosis Date  ? Anxiety   ? Arthritis   ? Bipolar disorder ([place])   ? Depression   ? Fibromyalgia   ? GERD (gastroesophageal reflux disease) 12/31/2002  ? HLD (hyperlipidemia)   ? diet controlled - no meds  ? HSV infection   ? Liver mass 07/2021  ? Osteoarthritis   ? Prolonged Q-T interval on ECG 02/2021  ? Sinusitis 12/31/2012  ? Wears glasses   ? ?Past Surgical History:  ?Procedure Laterality Date  ? BIOPSY  08/24/2021  ? Procedure: BIOPSY;  Surgeon: Irving Copas., MD;  Location: [place];  Service: Gastroenterology;;  ? BIOPSY  09/11/2021  ? Procedure: BIOPSY;  Surgeon: Irving Copas., MD;  Location: [facility];  Service: Gastroenterology;;  ? ESOPHAGOGASTRODUODENOSCOPY (EGD) WITH PROPOFOL N/A 08/11/2021  ? Procedure: ESOPHAGOGASTRODUODENOSCOPY (EGD) WITH PROPOFOL;  Surgeon: Carol Ada, MD;  Location: [facility];  Service: Endoscopy;  Laterality: N/A;  ? ESOPHAGOGASTRODUODENOSCOPY (EGD) WITH PROPOFOL N/A 08/24/2021  ? Procedure: ESOPHAGOGASTRODUODENOSCOPY (EGD) WITH PROPOFOL;  Surgeon: Rush Landmark Telford Nab., MD;  Location: [place];  Service: Gastroenterology;  Laterality: N/A;  ? ESOPHAGOGASTRODUODENOSCOPY (EGD) WITH PROPOFOL N/A 09/11/2021  ? Procedure:  ESOPHAGOGASTRODUODENOSCOPY (EGD) WITH PROPOFOL;  Surgeon: Rush Landmark Telford Nab., MD;  Location: [facility];  Service: Gastroenterology;  Laterality: N/A;  ? EUS N/A 08/24/2021  ? Procedure: UPPER ENDOSCOPIC ULTRASOUND (EUS) LINEAR;  Surgeon: Rush Landmark Telford Nab., MD;  Location: [place];  Service: Gastroenterology;  Laterality: N/A;  ? EUS N/A 09/11/2021  ? Procedure: UPPER ENDOSCOPIC ULTRASOUND (EUS) LINEAR;  Surgeon: Rush Landmark Telford Nab., MD;  Location: [facility];  Service: Gastroenterology;  Laterality: N/A;  ? FINE NEEDLE ASPIRATION  08/24/2021  ? Procedure: FINE NEEDLE ASPIRATION;  Surgeon: Rush Landmark Telford Nab., MD;  Location: [place];  Service: Gastroenterology;;  ? FINE NEEDLE ASPIRATION  09/11/2021  ? Procedure: FINE NEEDLE ASPIRATION (FNA) LINEAR;  Surgeon: Rush Landmark Telford Nab., MD;  Location: [facility];  Service: Gastroenterology;;  Delaine Lame. not in system to charge  ? TONSILLECTOMY  04/08/57  ? some date around then  ? TUBAL LIGATION  10/21/1984  ? UPPER ESOPHAGEAL ENDOSCOPIC ULTRASOUND (EUS) N/A 08/11/2021  ? Procedure: UPPER ESOPHAGEAL ENDOSCOPIC ULTRASOUND (EUS);  Surgeon: Carol Ada, MD;  Location: [facility];  Service: Endoscopy;  Laterality: N/A;  ? ?Patient Active Problem List  ? Diagnosis Date Noted  ? Back pain, chronic 04/03/2013  ? Anxiety 12/17/2012  ? Vitamin D insufficiency 12/17/2012  ? GERD (gastroesophageal reflux disease) 11/18/2012  ? Bipolar 1 disorder ([place]) 11/18/2012  ? Fibromyalgia 11/18/2012  ? ? ?REFERRING DIAG: weakness of both LE and UE weakness ? ?THERAPY DIAG:  ?Muscle weakness (  generalized) ? ?Other abnormalities of gait and mobility ? ?PERTINENT HISTORY: arthritis, fibromyalgia, anxiety, chronic LBP ? ?PRECAUTIONS: fall ? ?SUBJECTIVE: Pt stated it is getting easier to stand without using hands.  Continues to have difficulty going up stairs.  No reports of pain. ? ?PAIN:  ?Are you having pain? Yes: NPRS scale: 0/10 ?Pain location:   ?Pain  description: sore muscles following tx Tuesday ?Aggravating factors:   ?Relieving factors:   ? ? ? ?OBJECTIVE:  ?  ?TODAY'S TREATMENT: ?04/19/22: ?Heel raise and toe raise on incline 20x ?Squat 2x 10 reps 3-5" holds ?Wall squat 10x 5" ?SLS Lt 18", Rt 11" ?Lateral step ups 6" 2X10 bilateral with 1 HHA ?Forward step up 6" 2x 10 BLE with 1 HHA ?Hurdles 6 and 12 in 5 RT in // bars 1 HHA then no UE support ?Tandem stance 2x 30", last set with head turns (intermittent HHA) ?[employer] walk out being used this session, resume next session. ? ?04/17/22 ?Heel raise and toe raise on incline 15 reps ?Step up 6 inch step 2x 10 bilateral with 1 HHA ?Lateral step ups 6" 2X10 bilateral with 1 HHA ?Retro weighted walkout 5X each retro, forward, Rt/Lt 2 plates ?STS 20X no UE's standard chair ?Row green band 20X ?Extensions green band 10X  ? ?04/12/22 ?Heel raise and toe raise on incline 15 reps ?Step up 6 inch step 2x 10 bilateral ?Retro weighted walkout 1x 5 2 plates ?STS 2x 10  ?Row green band 1x20  ?LAQ 1x 10 3 second holds bilateral ? ?04/10/22: ?Heel raise and toe raise on incline 15 reps ?Standing march 15X 5" holds no HHA ?Tandem stance 2x 30" on foam ?SLS Lt 17", Rt 12" ?Vector stance 10X5"  ?Lunge on 4 in step no HHA 15x  ? ? ?04/06/22: ?Standing march 10X 5" holds no HHA ?Tandem stance 2x 30" on foam ?SLS Lt 16", Rt 18" ?Cyber walk out sidestep and retro/forward 2Pl 3RT each ?Squat to heel raises with minimal UE support 10x 5"  ?Lunge on 6 in step no HHA 10x 3-5" ? ?  ?PATIENT EDUCATION:  ?Education details: HEP ?Person educated: Patient ?Education method: Explanation and Demonstration ?Education comprehension: verbalized understanding and returned demonstration ?  ?  ?HOME EXERCISE PROGRAM: ?Access Code: [email or web address] ?URL: [email or web address] ?Date: 03/21/2022 ?Prepared by: Josue Hector ?  ?Exercises ?Heel Raises with Counter Support - 3 x daily - 7 x weekly - 2 sets - 10 reps ?Toe Raises with Counter Support - 3  x daily - 7 x weekly - 2 sets - 10 reps ?Standing March with Counter Support - 3 x daily - 7 x weekly - 2 sets - 10 reps ? ?Tandem stance 30x " ?  ?  ?ASSESSMENT: ?  ?CLINICAL IMPRESSION: ?Continued session focus with balance and functional strengthening for LE's.  Added wall squats for quad strengthening that was tolerated well, visual fatigue with new exercise.  Static balance improving, able to complete tandem stance  with no LOB or need of UE support.  Added head turns and dynamic surface which did required intermittent HHA.  Pt continues to required cueing for form and mechanics with exercises.  Pt continues to be limited by fatigue, required short duration seated rest breaks through session. ? ?  ?OBJECTIVE IMPAIRMENTS Abnormal gait, decreased activity tolerance, decreased balance, decreased endurance, decreased mobility, difficulty walking, decreased strength, improper body mechanics, and pain.  ?  ?ACTIVITY LIMITATIONS cleaning, community activity, meal prep, laundry, yard work, shopping, and yard work.  ?  ?  ?  GOALS: ?  ?  ?SHORT TERM GOALS: Target date: 04/04/2022 ?  ? Patient will be independent with initial HEP and self-management strategies to improve functional outcomes ?Baseline:  ?Goal status: ongoing ?  ?LONG TERM GOALS: Target date: 05/02/22 ?  ?Patient will be independent with advanced HEP and self-management strategies to improve functional outcomes ?Baseline:  ?Goal status: ongoing ?  ?2. Patient will be able to perform stand x 5 in < 15 seconds to demonstrate improvement in functional mobility and reduced risk for falls.  ?Baseline:  ?Goal status: ongoing ?  ?3.  Patient will report reduction of back pain to <3/10 at rest for improved quality of life and ability to perform ADLs  ?Baseline:  ?Goal status: ongoing ?  ?4. Patient will be able to maintain tandem stance >30 seconds on BLEs to improve stability and reduce risk for falls ?Baseline:  ?Goal status: ongoing ?  ?5. Patient will be able to  ambulate at least 325 feet during 2MWT with LRAD to demonstrate improved ability to perform functional mobility and associated tasks. ?Baseline:  ?Goal status: ongoing ? ? ?PLAN: ?PT FREQUENCY: 2x/week ?  ?PT DU

## 2022-04-19 NOTE — Progress Notes (Signed)
Virtual Visit via Telephone Note ? ?I connected with Katherine Middleton on 04/19/22 at  2:00 PM EDT by telephone and verified that I am speaking with the correct person using two identifiers. ? ?Location: ?Patient: home ?Provider: office ?  ?I discussed the limitations, risks, security and privacy concerns of performing an evaluation and management service by telephone and the availability of in person appointments. I also discussed with the patient that there may be a patient responsible charge related to this service. The patient expressed understanding and agreed to proceed. ? ? ? ?  ?I discussed the assessment and treatment plan with the patient. The patient was provided an opportunity to ask questions and all were answered. The patient agreed with the plan and demonstrated an understanding of the instructions. ?  ?The patient was advised to call back or seek an in-person evaluation if the symptoms worsen or if the condition fails to improve as anticipated. ? ?I provided 13 minutes of non-face-to-face time during this encounter. ? ? ?Levonne Spiller, MD ? ?BH MD/PA/NP OP Progress Note ? ?04/19/2022 2:18 PM ?[place]  ?MRN:  [id number] ? ?Chief Complaint:  ?Chief Complaint  ?Patient presents with  ? Depression  ? Follow-up  ? Anxiety  ? ?HPI: Patient is 71 year-old widowed Caucasian female who lives alone in [place]. . She is on disability for fibromyalgia arthritis and bipolar disorder. ?  ?The patient has been seeing a psychiatrist for years. Initially she was diagnosed with depression but eventually she developed some manic symptoms and now is designated as having bipolar disorder. She's often very tired due to her fibromyalgia and has chronic muscle aches and pains. She feels that the medicines she is on now been very helpful. She sleeping well and her mood is stable. She's not using drugs or alcohol denies suicidal ideation. She's never been psychotic but mentions having  "breakdown" about 10 years  ago. She's never been hospitalized ? ?The patient returns for follow-up after 3 months.  She is doing a little bit better.  She has been followed closely by oncology and surgery.  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  Oncology would like her to be able to undergo surgery but she is still too weak.  She is going to physical therapy to improve her balance and functionality.  To her credit she is gained a few pounds and now is up to 103 pounds.  She states that her mood is good and she denies significant depression or anxiety.  She is sleeping well.  She states that she is trying to eat well and is using Ensure supplements.  She denies any manic symptoms or significant anxiety ?Visit Diagnosis:  ?  ICD-10-CM   ?1. Bipolar 1 disorder ([facility])  F31.9 PARoxetine (PAXIL) 40 MG tablet  ?  traZODone (DESYREL) 150 MG tablet  ?  ? ? ?Past Psychiatric History: Long-term outpatient treatment ? ?Past Medical History:  ?Past Medical History:  ?Diagnosis Date  ? Anxiety   ? Arthritis   ? Bipolar disorder ([place])   ? Depression   ? Fibromyalgia   ? GERD (gastroesophageal reflux disease) 12/31/2002  ? HLD (hyperlipidemia)   ? diet controlled - no meds  ? HSV infection   ? Liver mass 07/2021  ? Osteoarthritis   ? Prolonged Q-T interval on ECG 02/2021  ? Sinusitis 12/31/2012  ? Wears glasses   ?  ?Past Surgical History:  ?Procedure Laterality Date  ? BIOPSY  08/24/2021  ?  Procedure: BIOPSY;  Surgeon: Irving Copas., MD;  Location: [place];  Service: Gastroenterology;;  ? BIOPSY  09/11/2021  ? Procedure: BIOPSY;  Surgeon: Irving Copas., MD;  Location: [facility];  Service: Gastroenterology;;  ? ESOPHAGOGASTRODUODENOSCOPY (EGD) WITH PROPOFOL N/A 08/11/2021  ? Procedure: ESOPHAGOGASTRODUODENOSCOPY (EGD) WITH PROPOFOL;  Surgeon: Carol Ada, MD;  Location: [facility];  Service: Endoscopy;  Laterality: N/A;  ? ESOPHAGOGASTRODUODENOSCOPY (EGD) WITH PROPOFOL N/A 08/24/2021  ? Procedure:  ESOPHAGOGASTRODUODENOSCOPY (EGD) WITH PROPOFOL;  Surgeon: Rush Landmark Telford Nab., MD;  Location: [place];  Service: Gastroenterology;  Laterality: N/A;  ? ESOPHAGOGASTRODUODENOSCOPY (EGD) WITH PROPOFOL N/A 09/11/2021  ? Procedure: ESOPHAGOGASTRODUODENOSCOPY (EGD) WITH PROPOFOL;  Surgeon: Rush Landmark Telford Nab., MD;  Location: [facility];  Service: Gastroenterology;  Laterality: N/A;  ? EUS N/A 08/24/2021  ? Procedure: UPPER ENDOSCOPIC ULTRASOUND (EUS) LINEAR;  Surgeon: Rush Landmark Telford Nab., MD;  Location: [place];  Service: Gastroenterology;  Laterality: N/A;  ? EUS N/A 09/11/2021  ? Procedure: UPPER ENDOSCOPIC ULTRASOUND (EUS) LINEAR;  Surgeon: Rush Landmark Telford Nab., MD;  Location: [facility];  Service: Gastroenterology;  Laterality: N/A;  ? FINE NEEDLE ASPIRATION  08/24/2021  ? Procedure: FINE NEEDLE ASPIRATION;  Surgeon: Rush Landmark Telford Nab., MD;  Location: [place];  Service: Gastroenterology;;  ? FINE NEEDLE ASPIRATION  09/11/2021  ? Procedure: FINE NEEDLE ASPIRATION (FNA) LINEAR;  Surgeon: Rush Landmark Telford Nab., MD;  Location: [facility];  Service: Gastroenterology;;  Delaine Lame. not in system to charge  ? TONSILLECTOMY  04/08/57  ? some date around then  ? TUBAL LIGATION  10/21/1984  ? UPPER ESOPHAGEAL ENDOSCOPIC ULTRASOUND (EUS) N/A 08/11/2021  ? Procedure: UPPER ESOPHAGEAL ENDOSCOPIC ULTRASOUND (EUS);  Surgeon: Carol Ada, MD;  Location: [facility];  Service: Endoscopy;  Laterality: N/A;  ? ? ?Family Psychiatric History: see below ? ?Family History:  ?Family History  ?Problem Relation Age of Onset  ? Bipolar disorder Sister   ? Anxiety disorder Sister   ? Dementia Sister 51  ? Paranoid behavior Sister   ? Bipolar disorder Father   ? Schizophrenia Father   ? Alcohol abuse Maternal Uncle   ? Alcohol abuse Paternal Uncle   ? Anxiety disorder Mother   ? ADD / ADHD Other   ? ADD / ADHD Other   ? Healthy Other   ? OCD Neg Hx   ? Seizures Neg Hx   ? Sexual abuse Neg Hx   ? Physical  abuse Neg Hx   ? ? ?Social History:  ?Social History  ? ?Socioeconomic History  ? Marital status: Married  ?  Spouse name: Not on file  ? Number of children: Not on file  ? Years of education: Not on file  ? Highest education level: Not on file  ?Occupational History  ? Not on file  ?Tobacco Use  ? Smoking status: Every Day  ?  Packs/day: 0.50  ?  Years: 30.00  ?  Pack years: 15.00  ?  Types: Cigarettes  ? Smokeless tobacco: Never  ? Tobacco comments:  ?  11-15 cigs a day as of 05/01/2013  ?Vaping Use  ? Vaping Use: Never used  ?Substance and Sexual Activity  ? Alcohol use: No  ? Drug use: No  ? Sexual activity: Yes  ?  Partners: Male  ?  Birth control/protection: Post-menopausal  ?Other Topics Concern  ? Not on file  ?Social History Narrative  ? Left handed   ? Lives alone   ? ?Social Determinants of Health  ? ?Financial Resource Strain: Not  on file  ?Food Insecurity: Not on file  ?Transportation Needs: Not on file  ?Physical Activity: Not on file  ?Stress: Not on file  ?Social Connections: Not on file  ? ? ?Allergies:  ?Allergies  ?Allergen Reactions  ? Amoxicillin-Pot Clavulanate Anaphylaxis and Swelling  ? Cephalosporins Anaphylaxis  ?  Other reaction(s): Unknown  ? Codeine Itching  ? Cymbalta [Duloxetine Hcl] Other (See Comments)  ?  GERD  ? Duloxetine   ?  Other reaction(s): Other (See Comments), Unknown ?GERD ?  ? Oxycodone Itching and Other (See Comments)  ?  "OUT THERE", climbing the walls  ? Penicillins Anaphylaxis and Swelling  ?  Throat swelling ?Reaction: 8-10 years ago  ? Baclofen Other (See Comments)  ?  Anxiety got much worse and possibly ppted manic episode ?Other reaction(s): Unknown  ? Povidone Iodine Itching and Other (See Comments)  ?  Particularly if in a douche  ?Other reaction(s): Unknown  ? Flonase [Fluticasone]   ?  Makes eye pressure to increase  ? Propoxyphene Itching  ?  Anxiety/jittery   ? Sulfa Antibiotics Swelling  ? Sulfasalazine Swelling  ?  Other reaction(s): Unknown  ? Tramadol   ?   Other reaction(s): Other (See Comments), Unknown ?Patient states that this makes her nervous ?  ? Amitriptyline Other (See Comments)  ?  Caused bowels to empty quickly and couldn't sleep on it. ?Other reaction(s):

## 2022-04-24 ENCOUNTER — Ambulatory Visit (HOSPITAL_COMMUNITY): Payer: Medicare Other | Admitting: Physical Therapy

## 2022-04-24 ENCOUNTER — Encounter (HOSPITAL_COMMUNITY): Payer: Self-pay | Admitting: Physical Therapy

## 2022-04-24 DIAGNOSIS — R262 Difficulty in walking, not elsewhere classified: Secondary | ICD-10-CM

## 2022-04-24 DIAGNOSIS — M545 Low back pain, unspecified: Secondary | ICD-10-CM

## 2022-04-24 DIAGNOSIS — R2689 Other abnormalities of gait and mobility: Secondary | ICD-10-CM

## 2022-04-24 DIAGNOSIS — M6281 Muscle weakness (generalized): Secondary | ICD-10-CM | POA: Diagnosis not present

## 2022-04-24 DIAGNOSIS — Z9181 History of falling: Secondary | ICD-10-CM

## 2022-04-24 NOTE — Therapy (Signed)
?OUTPATIENT PHYSICAL THERAPY TREATMENT NOTE ? ? ?Patient Name: Katherine Middleton ?MRN: [id number] ?DOB:[birth year], 71 y.o., female ?Today's Date: 04/24/2022 ? ?PCP: Aletha Halim., PA-C ?REFERRING PROVIDER: Aletha Halim., PA-C ? ?END OF SESSION:  ? PT End of Session - 04/24/22 1347   ? ? Visit Number 9   ? Number of Visits 12   ? Date for PT Re-Evaluation 05/02/22   ? Authorization Type UHC   ? Authorization Time Period No auth required, no visit limit   ? PT Start Time 8466   ? PT Stop Time 5993   ? PT Time Calculation (min) 40 min   ? Activity Tolerance Patient tolerated treatment well   ? Behavior During Therapy [facility] for tasks assessed/performed   ? ?  ?  ? ?  ? ? ? ?Past Medical History:  ?Diagnosis Date  ? Anxiety   ? Arthritis   ? Bipolar disorder ([place])   ? Depression   ? Fibromyalgia   ? GERD (gastroesophageal reflux disease) 12/31/2002  ? HLD (hyperlipidemia)   ? diet controlled - no meds  ? HSV infection   ? Liver mass 07/2021  ? Osteoarthritis   ? Prolonged Q-T interval on ECG 02/2021  ? Sinusitis 12/31/2012  ? Wears glasses   ? ?Past Surgical History:  ?Procedure Laterality Date  ? BIOPSY  08/24/2021  ? Procedure: BIOPSY;  Surgeon: Irving Copas., MD;  Location: [place];  Service: Gastroenterology;;  ? BIOPSY  09/11/2021  ? Procedure: BIOPSY;  Surgeon: Irving Copas., MD;  Location: [facility];  Service: Gastroenterology;;  ? ESOPHAGOGASTRODUODENOSCOPY (EGD) WITH PROPOFOL N/A 08/11/2021  ? Procedure: ESOPHAGOGASTRODUODENOSCOPY (EGD) WITH PROPOFOL;  Surgeon: Carol Ada, MD;  Location: [facility];  Service: Endoscopy;  Laterality: N/A;  ? ESOPHAGOGASTRODUODENOSCOPY (EGD) WITH PROPOFOL N/A 08/24/2021  ? Procedure: ESOPHAGOGASTRODUODENOSCOPY (EGD) WITH PROPOFOL;  Surgeon: Rush Landmark Telford Nab., MD;  Location: [place];  Service: Gastroenterology;  Laterality: N/A;  ? ESOPHAGOGASTRODUODENOSCOPY (EGD) WITH PROPOFOL N/A 09/11/2021  ? Procedure: ESOPHAGOGASTRODUODENOSCOPY (EGD)  WITH PROPOFOL;  Surgeon: Rush Landmark Telford Nab., MD;  Location: [facility];  Service: Gastroenterology;  Laterality: N/A;  ? EUS N/A 08/24/2021  ? Procedure: UPPER ENDOSCOPIC ULTRASOUND (EUS) LINEAR;  Surgeon: Rush Landmark Telford Nab., MD;  Location: [place];  Service: Gastroenterology;  Laterality: N/A;  ? EUS N/A 09/11/2021  ? Procedure: UPPER ENDOSCOPIC ULTRASOUND (EUS) LINEAR;  Surgeon: Rush Landmark Telford Nab., MD;  [facility];  Service: Gastroenterology;  Laterality: N/A;  ? FINE NEEDLE ASPIRATION  08/24/2021  ? Procedure: FINE NEEDLE ASPIRATION;  Surgeon: Rush Landmark Telford Nab., MD;  Location: [place];  Service: Gastroenterology;;  ? FINE NEEDLE ASPIRATION  09/11/2021  ? Procedure: FINE NEEDLE ASPIRATION (FNA) LINEAR;  Surgeon: Rush Landmark Telford Nab., MD;  [facility]: [facility];  Service: Gastroenterology;;  Delaine Lame. not in system to charge  ? TONSILLECTOMY  04/08/57  ? some date around then  ? TUBAL LIGATION  10/21/1984  ? UPPER ESOPHAGEAL ENDOSCOPIC ULTRASOUND (EUS) N/A 08/11/2021  ? Procedure: UPPER ESOPHAGEAL ENDOSCOPIC ULTRASOUND (EUS);  Surgeon: Carol Ada, MD;  Location: [facility];  Service: Endoscopy;  Laterality: N/A;  ? ?Patient Active Problem List  ? Diagnosis Date Noted  ? Back pain, chronic 04/03/2013  ? Anxiety 12/17/2012  ? Vitamin D insufficiency 12/17/2012  ? GERD (gastroesophageal reflux disease) 11/18/2012  ? Bipolar 1 disorder ([place]) 11/18/2012  ? Fibromyalgia 11/18/2012  ? ? ?REFERRING DIAG: weakness of both LE and UE weakness ? ?THERAPY DIAG:  ?Muscle weakness (generalized) ? ?Other  abnormalities of gait and mobility ? ?Low back pain, unspecified back pain laterality, unspecified chronicity, unspecified whether sciatica present ? ?History of falling ? ?Difficulty in walking, not elsewhere classified ? ?PERTINENT HISTORY: arthritis, fibromyalgia, anxiety, chronic LBP ? ?PRECAUTIONS: fall ? ?SUBJECTIVE: Patient reports increased neck and shoulder pain today,  not sure why. Been about 4 days.  ? ?PAIN:  ?Are you having pain? Yes: NPRS scale: 9/10 ?Pain location: neck and shoulders ?Pain description: tight, aching, sharp ?Aggravating factors: turning head ?Relieving factors: rest, Tylenol  ? ? ? ?OBJECTIVE:  ?  ?TODAY'S TREATMENT: ?04/24/22 ?Heel/ toe raise x20 each  ?Stairs 4 inch single rail reciprocal 3 RT ?Tandem stance with head turns x15 each  (very challenging)  ?Tandem stance 30" holds each   ?FWD and lateral step over hurdles 4 inch hurdles 5 RT each  ?Tandem gait 5 RT  ?Chair squats 2 x 10 ? ?(Moist heat to neck during first 10 min of treatment, non billed)  ?     ? ?04/19/22: ?Heel raise and toe raise on incline 20x ?Squat 2x 10 reps 3-5" holds ?Wall squat 10x 5" ?SLS Lt 18", Rt 11" ?Lateral step ups 6" 2X10 bilateral with 1 HHA ?Forward step up 6" 2x 10 BLE with 1 HHA ?Hurdles 6 and 12 in 5 RT in // bars 1 HHA then no UE support ?Tandem stance 2x 30", last set with head turns (intermittent HHA) ?Bodycraft walk out being used this session, resume next session. ? ?04/17/22 ?Heel raise and toe raise on incline 15 reps ?Step up 6 inch step 2x 10 bilateral with 1 HHA ?Lateral step ups 6" 2X10 bilateral with 1 HHA ?Retro weighted walkout 5X each retro, forward, Rt/Lt 2 plates ?STS 20X no UE's standard chair ?Row green band 20X ?Extensions green band 10X  ? ?PATIENT EDUCATION:  ?Education details: Exercise form and function  ?Person educated: Patient ?Education method: Explanation and Demonstration ?Education comprehension: verbalized understanding and returned demonstration ?  ?  ?HOME EXERCISE PROGRAM: ?Access Code: [email or web address] ?URL: [email or web address] ?Date: 03/21/2022 ?Prepared by: Josue Hector ?  ?Exercises ?Heel Raises with Counter Support - 3 x daily - 7 x weekly - 2 sets - 10 reps ?Toe Raises with Counter Support - 3 x daily - 7 x weekly - 2 sets - 10 reps ?Standing March with Counter Support - 3 x daily - 7 x weekly - 2 sets - 10 reps ? ?Tandem  stance 30x " ?  ?  ?ASSESSMENT: ?  ?CLINICAL IMPRESSION: ?Patient continues to be challenged with balance activity. Increased difficulty with head turns in tandem stance position. Patient cued on posturing and limiting rotation ROM for improved form. Moist heat provided during treatment to address chief complaint of neck pain today with good results following. Patient will continue to benefit from skilled therapy services to reduce remaining deficits and improve functional ability.  ? ?  ?OBJECTIVE IMPAIRMENTS Abnormal gait, decreased activity tolerance, decreased balance, decreased endurance, decreased mobility, difficulty walking, decreased strength, improper body mechanics, and pain.  ?  ?ACTIVITY LIMITATIONS cleaning, community activity, meal prep, laundry, yard work, shopping, and yard work.  ?  ?  ?GOALS: ?  ?  ?SHORT TERM GOALS: Target date: 04/04/2022 ?  ? Patient will be independent with initial HEP and self-management strategies to improve functional outcomes ?Baseline:  ?Goal status: ongoing ?  ?LONG TERM GOALS: Target date: 05/02/22 ?  ?Patient will be independent with advanced HEP and self-management strategies to improve functional outcomes ?Baseline:  ?Goal  status: ongoing ?  ?2. Patient will be able to perform stand x 5 in < 15 seconds to demonstrate improvement in functional mobility and reduced risk for falls.  ?Baseline:  ?Goal status: ongoing ?  ?3.  Patient will report reduction of back pain to <3/10 at rest for improved quality of life and ability to perform ADLs  ?Baseline:  ?Goal status: ongoing ?  ?4. Patient will be able to maintain tandem stance >30 seconds on BLEs to improve stability and reduce risk for falls ?Baseline:  ?Goal status: ongoing ?  ?5. Patient will be able to ambulate at least 325 feet during 2MWT with LRAD to demonstrate improved ability to perform functional mobility and associated tasks. ?Baseline:  ?Goal status: ongoing ? ? ?PLAN: ?PT FREQUENCY: 2x/week ?  ?PT DURATION: 6  weeks ?  ?PLANNED INTERVENTIONS: Therapeutic exercises, Therapeutic activity, Neuromuscular re-education, Balance training, Gait training, Patient/Family education, Joint manipulation, Joint mobilizati

## 2022-04-26 ENCOUNTER — Ambulatory Visit (HOSPITAL_COMMUNITY): Payer: Medicare Other

## 2022-04-26 ENCOUNTER — Encounter (HOSPITAL_COMMUNITY): Payer: Self-pay

## 2022-04-26 DIAGNOSIS — M6281 Muscle weakness (generalized): Secondary | ICD-10-CM | POA: Diagnosis not present

## 2022-04-26 DIAGNOSIS — R2689 Other abnormalities of gait and mobility: Secondary | ICD-10-CM

## 2022-04-26 NOTE — Therapy (Signed)
?OUTPATIENT PHYSICAL THERAPY TREATMENT NOTE ?AND PROGRESS NOTE  ? ? ?Patient Name: Katherine Middleton ?MRN: [id number] ?DOB:1951/11/17, 71 y.o., female ?Today's Date: 04/26/2022 ? ?PCP: Aletha Halim., PA-C ?REFERRING PROVIDER: Aletha Halim., PA-C ? ?Progress Note  ? ?Reporting Period 03/21/22 to 04/26/22 as today is visit number 10  ? ? See note below for Objective Data and Assessment of Progress/Goals  ? ? ?END OF SESSION:  ? PT End of Session - 04/26/22 1358   ? ? Visit Number 10   ? Number of Visits 12   ? Date for PT Re-Evaluation 05/02/22   ? Authorization Type UHC updated on 04/26/22 is actually [facility] Medicare and thus follows medicare guidlines   ? Authorization Time Period No auth required, no visit limit   ? PT Start Time 3300   ? PT Stop Time 7622   ? PT Time Calculation (min) 36 min   ? Activity Tolerance Patient tolerated treatment well   ? Behavior During Therapy [facility] for tasks assessed/performed   ? ?  ?  ? ?  ? ? ? ?Past Medical History:  ?Diagnosis Date  ? Anxiety   ? Arthritis   ? Bipolar disorder ([place])   ? Depression   ? Fibromyalgia   ? GERD (gastroesophageal reflux disease) 12/31/2002  ? HLD (hyperlipidemia)   ? diet controlled - no meds  ? HSV infection   ? Liver mass 07/2021  ? Osteoarthritis   ? Prolonged Q-T interval on ECG 02/2021  ? Sinusitis 12/31/2012  ? Wears glasses   ? ?Past Surgical History:  ?Procedure Laterality Date  ? BIOPSY  08/24/2021  ? Procedure: BIOPSY;  Surgeon: Irving Copas., MD;  Location: [place];  Service: Gastroenterology;;  ? BIOPSY  09/11/2021  ? Procedure: BIOPSY;  Surgeon: Irving Copas., MD;  Location: [facility];  Service: Gastroenterology;;  ? ESOPHAGOGASTRODUODENOSCOPY (EGD) WITH PROPOFOL N/A 08/11/2021  ? Procedure: ESOPHAGOGASTRODUODENOSCOPY (EGD) WITH PROPOFOL;  Surgeon: Carol Ada, MD;  [facility];  Service: Endoscopy;  Laterality: N/A;  ? ESOPHAGOGASTRODUODENOSCOPY (EGD) WITH PROPOFOL N/A 08/24/2021  ? Procedure:  ESOPHAGOGASTRODUODENOSCOPY (EGD) WITH PROPOFOL;  Surgeon: Rush Landmark Telford Nab., MD;  Location: [place];  Service: Gastroenterology;  Laterality: N/A;  ? ESOPHAGOGASTRODUODENOSCOPY (EGD) WITH PROPOFOL N/A 09/11/2021  ? Procedure: ESOPHAGOGASTRODUODENOSCOPY (EGD) WITH PROPOFOL;  Surgeon: Rush Landmark Telford Nab., MD;  Location: [facility];  Service: Gastroenterology;  Laterality: N/A;  ? EUS N/A 08/24/2021  ? Procedure: UPPER ENDOSCOPIC ULTRASOUND (EUS) LINEAR;  Surgeon: Rush Landmark Telford Nab., MD;  Location: [place];  Service: Gastroenterology;  Laterality: N/A;  ? EUS N/A 09/11/2021  ? Procedure: UPPER ENDOSCOPIC ULTRASOUND (EUS) LINEAR;  Surgeon: Rush Landmark Telford Nab., MD;  [facility];  Service: Gastroenterology;  Laterality: N/A;  ? FINE NEEDLE ASPIRATION  08/24/2021  ? Procedure: FINE NEEDLE ASPIRATION;  Surgeon: Rush Landmark Telford Nab., MD;  Location: [place];  Service: Gastroenterology;;  ? FINE NEEDLE ASPIRATION  09/11/2021  ? Procedure: FINE NEEDLE ASPIRATION (FNA) LINEAR;  Surgeon: Rush Landmark Telford Nab., MD;  [facility]: [facility];  Service: Gastroenterology;;  Delaine Lame. not in system to charge  ? TONSILLECTOMY  04/08/57  ? some date around then  ? TUBAL LIGATION  10/21/1984  ? UPPER ESOPHAGEAL ENDOSCOPIC ULTRASOUND (EUS) N/A 08/11/2021  ? Procedure: UPPER ESOPHAGEAL ENDOSCOPIC ULTRASOUND (EUS);  Surgeon: Carol Ada, MD;  Location: [facility];  Service: Endoscopy;  Laterality: N/A;  ? ?Patient Active Problem List  ? Diagnosis Date Noted  ? Back pain, chronic 04/03/2013  ?  Anxiety 12/17/2012  ? Vitamin D insufficiency 12/17/2012  ? GERD (gastroesophageal reflux disease) 11/18/2012  ? Bipolar 1 disorder ([place]) 11/18/2012  ? Fibromyalgia 11/18/2012  ? ? ?REFERRING DIAG: weakness of both LE and UE weakness ? ?THERAPY DIAG:  ?Muscle weakness (generalized) ? ?Other abnormalities of gait and mobility ? ?PERTINENT HISTORY: arthritis, fibromyalgia, anxiety, chronic  LBP ? ?PRECAUTIONS: fall ? ?SUBJECTIVE: Patient reports improved neck and shoulder pain today as last session helped. Would like to continue with the moist heat.  ?Overall, patient reports she is feeling better by 80%, able to do HEP every other day mostly on days not in clinic.  ? ?PAIN:   5/10 for neck and shoulders, 4/10 constant lower back pain ?Are you having pain? Yes: NPRS scale: 5/10 ?Pain location: neck and shoulders ?Pain description: tight, aching, sharp ?Aggravating factors: turning head ?Relieving factors: rest, Tylenol  ? ? ? ?OBJECTIVE:  ? ?LE MMT: ?  ?MMT Right ?03/21/2022 Left ?03/21/2022 Right ?04/26/22 Left ?04/26/22  ?Hip flexion '5 5 5 5  '$ ?Hip extension 4 4 4+ 4+  ?Hip abduction 4+ 4+ 4+ 4+  ?Hip adduction        ?Hip internal rotation        ?Hip external rotation        ?Knee flexion        ?Knee extension '5 5 5 5  '$ ?Ankle dorsiflexion '5 5 5 5  '$ ?Ankle plantarflexion        ?Ankle inversion        ?Ankle eversion        ? (Blank rows = not tested) ?  ?  ?  ?FUNCTIONAL TESTS:  ?5 times sit to stand: At eval = 18.8 seconds with use of hands on thighs  ?04/26/22 - 17.5 seconds with no use of hands ?2 minute walk test: At eval = 226 feet using quad cane, decreased stride and noted foot drop on LLE  ? 04/26/22 - 375 feet using quad cane, equal stride length, no foot drop observed  ?Balance: At eval = tandem 4 sec RT, 7 sec LT ? 04/26/22 - B tandem Right and Left both for 22 seconds with no hand assist ?Single leg stance: At eval = unable to maintain without UE assist  ?   04/26/22 - 10 seconds no hand assist on left leg; 5 seconds no hand assist on right leg  ?  ? ? ?TODAY'S TREATMENT: ?04/26/22:  ?Re-assessment progress note testing ?Seated with cervical heat for subjective and starting movement ? Seated there-ex -  ?  - heel raises x 10  ?  - toe raises x 10  ?    - LAQ x 10 B alternating ? Re-testing functional tests, see measurements above  ? Seated cervical postural opening trialed all in sitting tall and  then standing tall =  ?  - shoulder shrug 2 x 10 ?  - shoulder rolls 2 x 10  ?  - shoulder horizontal abduction to barrel hugs x 10  ? ?04/24/22 ?Heel/ toe raise x20 each  ?Stairs 4 inch single rail reciprocal 3 RT ?Tandem stance with head turns x15 each  (very challenging)  ?Tandem stance 30" holds each   ?FWD and lateral step over hurdles 4 inch hurdles 5 RT each  ?Tandem gait 5 RT  ?Chair squats 2 x 10 ? ?(Moist heat to neck during first 10 min of treatment, non billed)  ?     ? ?04/19/22: ?Heel raise and toe raise  on incline 20x ?Squat 2x 10 reps 3-5" holds ?Wall squat 10x 5" ?SLS Lt 18", Rt 11" ?Lateral step ups 6" 2X10 bilateral with 1 HHA ?Forward step up 6" 2x 10 BLE with 1 HHA ?Hurdles 6 and 12 in 5 RT in // bars 1 HHA then no UE support ?Tandem stance 2x 30", last set with head turns (intermittent HHA) ?Bodycraft walk out being used this session, resume next session. ? ?04/17/22 ?Heel raise and toe raise on incline 15 reps ?Step up 6 inch step 2x 10 bilateral with 1 HHA ?Lateral step ups 6" 2X10 bilateral with 1 HHA ?Retro weighted walkout 5X each retro, forward, Rt/Lt 2 plates ?STS 20X no UE's standard chair ?Row green band 20X ?Extensions green band 10X  ? ?PATIENT EDUCATION:  ?Education details: Exercise form and function 04/26/22: re-assessment findings, baseline seated activity for post op safety as well ?Person educated: Patient ?Education method: Explanation and Demonstration ?Education comprehension: verbalized understanding and returned demonstration ?  ?  ?HOME EXERCISE PROGRAM: ?Access Code: [email or web address] ?URL: [email or web address] ?Date: 03/21/2022 ?Prepared by: Josue Hector ?  ?Exercises ?Heel Raises with Counter Support - 3 x daily - 7 x weekly - 2 sets - 10 reps ?Toe Raises with Counter Support - 3 x daily - 7 x weekly - 2 sets - 10 reps ?Standing March with Counter Support - 3 x daily - 7 x weekly - 2 sets - 10 reps ? ?Tandem stance 30x " ?  ?  ?ASSESSMENT: ?  ?CLINICAL  IMPRESSION: ?Today's session focused on assessing overall progress through functional activities.  Zoee demonstrated overall improvement with ability to improve 2 minute walk test and overall balance in both single leg stance and

## 2022-05-01 ENCOUNTER — Encounter (HOSPITAL_COMMUNITY): Payer: Self-pay | Admitting: Physical Therapy

## 2022-05-01 ENCOUNTER — Ambulatory Visit (HOSPITAL_COMMUNITY): Payer: Medicare Other | Attending: Family Medicine | Admitting: Physical Therapy

## 2022-05-01 DIAGNOSIS — Z9181 History of falling: Secondary | ICD-10-CM | POA: Diagnosis present

## 2022-05-01 DIAGNOSIS — R262 Difficulty in walking, not elsewhere classified: Secondary | ICD-10-CM | POA: Insufficient documentation

## 2022-05-01 DIAGNOSIS — R2689 Other abnormalities of gait and mobility: Secondary | ICD-10-CM | POA: Diagnosis present

## 2022-05-01 DIAGNOSIS — M6281 Muscle weakness (generalized): Secondary | ICD-10-CM | POA: Diagnosis present

## 2022-05-01 DIAGNOSIS — M545 Low back pain, unspecified: Secondary | ICD-10-CM | POA: Diagnosis present

## 2022-05-01 NOTE — Therapy (Signed)
?OUTPATIENT PHYSICAL THERAPY TREATMENT NOTE ?AND PROGRESS NOTE  ? ? ?Patient Name: Katherine Middleton ?MRN: [id number] ?DOB:October 21, 1951, 71 y.o., female ?Today's Date: 05/01/2022 ? ?PCP: Aletha Halim., PA-C ?REFERRING PROVIDER: Aletha Halim., PA-C ? ? ? See note below for Objective Data and Assessment of Progress/Goals  ? ? ?END OF SESSION:  ? PT End of Session - 05/01/22 1449   ? ? Visit Number 11   ? Number of Visits 18   ? Date for PT Re-Evaluation 05/26/22   ? Authorization Type UHC updated on 04/26/22 is actually [facility] Medicare and thus follows medicare guidlines   ? Authorization Time Period No auth required, no visit limit   ? Progress Note Due on Visit 20   ? PT Start Time 8563   late arrival  ? PT Stop Time 1515   ? PT Time Calculation (min) 33 min   ? Activity Tolerance Patient tolerated treatment well;Patient limited by fatigue   ? Behavior During Therapy [facility] for tasks assessed/performed   ? ?  ?  ? ?  ? ? ? ?Past Medical History:  ?Diagnosis Date  ? Anxiety   ? Arthritis   ? Bipolar disorder ([place])   ? Depression   ? Fibromyalgia   ? GERD (gastroesophageal reflux disease) 12/31/2002  ? HLD (hyperlipidemia)   ? diet controlled - no meds  ? HSV infection   ? Liver mass 07/2021  ? Osteoarthritis   ? Prolonged Q-T interval on ECG 02/2021  ? Sinusitis 12/31/2012  ? Wears glasses   ? ?Past Surgical History:  ?Procedure Laterality Date  ? BIOPSY  08/24/2021  ? Procedure: BIOPSY;  Surgeon: Irving Copas., MD;  Location: [place];  Service: Gastroenterology;;  ? BIOPSY  09/11/2021  ? Procedure: BIOPSY;  Surgeon: Irving Copas., MD;  Location: [facility];  Service: Gastroenterology;;  ? ESOPHAGOGASTRODUODENOSCOPY (EGD) WITH PROPOFOL N/A 08/11/2021  ? Procedure: ESOPHAGOGASTRODUODENOSCOPY (EGD) WITH PROPOFOL;  Surgeon: Carol Ada, MD;  Location: [facility];  Service: Endoscopy;  Laterality: N/A;  ? ESOPHAGOGASTRODUODENOSCOPY (EGD) WITH PROPOFOL N/A 08/24/2021  ? Procedure:  ESOPHAGOGASTRODUODENOSCOPY (EGD) WITH PROPOFOL;  Surgeon: Rush Landmark Telford Nab., MD;  Location: [place];  Service: Gastroenterology;  Laterality: N/A;  ? ESOPHAGOGASTRODUODENOSCOPY (EGD) WITH PROPOFOL N/A 09/11/2021  ? Procedure: ESOPHAGOGASTRODUODENOSCOPY (EGD) WITH PROPOFOL;  Surgeon: Rush Landmark Telford Nab., MD;  Location: [facility];  Service: Gastroenterology;  Laterality: N/A;  ? EUS N/A 08/24/2021  ? Procedure: UPPER ENDOSCOPIC ULTRASOUND (EUS) LINEAR;  Surgeon: Rush Landmark Telford Nab., MD;  Location: [place];  Service: Gastroenterology;  Laterality: N/A;  ? EUS N/A 09/11/2021  ? Procedure: UPPER ENDOSCOPIC ULTRASOUND (EUS) LINEAR;  Surgeon: Rush Landmark Telford Nab., MD;  Location: [facility];  Service: Gastroenterology;  Laterality: N/A;  ? FINE NEEDLE ASPIRATION  08/24/2021  ? Procedure: FINE NEEDLE ASPIRATION;  Surgeon: Rush Landmark Telford Nab., MD;  Location: [place];  Service: Gastroenterology;;  ? FINE NEEDLE ASPIRATION  09/11/2021  ? Procedure: FINE NEEDLE ASPIRATION (FNA) LINEAR;  Surgeon: Rush Landmark Telford Nab., MD;  Location: [facility];  Service: Gastroenterology;;  Delaine Lame. not in system to charge  ? TONSILLECTOMY  04/08/57  ? some date around then  ? TUBAL LIGATION  10/21/1984  ? UPPER ESOPHAGEAL ENDOSCOPIC ULTRASOUND (EUS) N/A 08/11/2021  ? Procedure: UPPER ESOPHAGEAL ENDOSCOPIC ULTRASOUND (EUS);  Surgeon: Carol Ada, MD;  Location: [facility];  Service: Endoscopy;  Laterality: N/A;  ? ?Patient Active Problem List  ? Diagnosis Date Noted  ? Back pain, chronic 04/03/2013  ? Anxiety  12/17/2012  ? Vitamin D insufficiency 12/17/2012  ? GERD (gastroesophageal reflux disease) 11/18/2012  ? Bipolar 1 disorder ([place]) 11/18/2012  ? Fibromyalgia 11/18/2012  ? ? ?REFERRING DIAG: weakness of both LE and UE weakness ? ?THERAPY DIAG:  ?Muscle weakness (generalized) ? ?Other abnormalities of gait and mobility ? ?PERTINENT HISTORY: arthritis, fibromyalgia, anxiety, chronic  LBP ? ?PRECAUTIONS: fall ? ?SUBJECTIVE: Ongoing pain in shoulders. Low back doing alright, balance is improving.  ? ?PAIN:   ?Are you having pain? Yes: NPRS scale: 6/10 ?Pain location: neck and shoulders ?Pain description: tight, aching, sharp ?Aggravating factors: turning head ?Relieving factors: rest, Tylenol  ? ? ? ?OBJECTIVE:  ? ?LE MMT: ?  ?MMT Right ?03/21/2022 Left ?03/21/2022 Right ?04/26/22 Left ?04/26/22  ?Hip flexion '5 5 5 5  '$ ?Hip extension 4 4 4+ 4+  ?Hip abduction 4+ 4+ 4+ 4+  ?Hip adduction        ?Hip internal rotation        ?Hip external rotation        ?Knee flexion        ?Knee extension '5 5 5 5  '$ ?Ankle dorsiflexion '5 5 5 5  '$ ?Ankle plantarflexion        ?Ankle inversion        ?Ankle eversion        ? (Blank rows = not tested) ?  ?  ?  ?FUNCTIONAL TESTS:  ?5 times sit to stand: At eval = 18.8 seconds with use of hands on thighs  ?04/26/22 - 17.5 seconds with no use of hands ?2 minute walk test: At eval = 226 feet using quad cane, decreased stride and noted foot drop on LLE  ? 04/26/22 - 375 feet using quad cane, equal stride length, no foot drop observed  ?Balance: At eval = tandem 4 sec RT, 7 sec LT ? 04/26/22 - B tandem Right and Left both for 22 seconds with no hand assist ?Single leg stance: At eval = unable to maintain without UE assist  ?   04/26/22 - 10 seconds no hand assist on left leg; 5 seconds no hand assist on right leg  ?  ? ? ?TODAY'S TREATMENT: ?05/01/22 ?    (Moist heat to neck during first 10 min of treatment, non billed)  ?Heel raise 2 x 10 ?Toe raise 2 x10 ?Standing march 2lb x20 each  ?Standing hip abduction 2lb 2 x 10 each ?Standing hip extension 2lb 2 x 10 each ?Standing knee flexion 2 lb 2 x 10 each  ?Step taps 6 inch box x20 each ?Step up 6 inch box x  10 each  ? ?04/26/22:  ?Re-assessment progress note testing ?Seated with cervical heat for subjective and starting movement ? Seated there-ex -  ?  - heel raises x 10  ?  - toe raises x 10  ?    - LAQ x 10 B alternating ? Re-testing  functional tests, see measurements above  ? Seated cervical postural opening trialed all in sitting tall and then standing tall =  ?  - shoulder shrug 2 x 10 ?  - shoulder rolls 2 x 10  ?  - shoulder horizontal abduction to barrel hugs x 10  ? ?04/24/22 ?Heel/ toe raise x20 each  ?Stairs 4 inch single rail reciprocal 3 RT ?Tandem stance with head turns x15 each  (very challenging)  ?Tandem stance 30" holds each   ?FWD and lateral step over hurdles 4 inch hurdles 5 RT each  ?Tandem gait  5 RT  ?Chair squats 2 x 10 ? ?(Moist heat to neck during first 10 min of treatment, non billed)  ?     ? ?PATIENT EDUCATION:  ?Education details: Exercise form and function 04/26/22: re-assessment findings, baseline seated activity for post op safety as well ?Person educated: Patient ?Education method: Explanation and Demonstration ?Education comprehension: verbalized understanding and returned demonstration ?  ?  ?HOME EXERCISE PROGRAM: ?Access Code: [email or web address] ?URL: [email or web address] ?Date: 03/21/2022 ?Prepared by: Josue Hector ?  ?Exercises ?Heel Raises with Counter Support - 3 x daily - 7 x weekly - 2 sets - 10 reps ?Toe Raises with Counter Support - 3 x daily - 7 x weekly - 2 sets - 10 reps ?Standing March with Counter Support - 3 x daily - 7 x weekly - 2 sets - 10 reps ? ?Tandem stance 30x " ?  ?  ?ASSESSMENT: ?  ?CLINICAL IMPRESSION: ?   Patient tolerated session well today. Noting mild fatigue throughout requiring 3 short rest breaks. Progressed LE strengthening with adding 2lb ankle weights to standing hip exercises. Patient showing improved balance with 6 inch step taps today. Patient will continue to benefit from skilled therapy services to reduce remaining deficits and improve functional ability.  ?  ?  ?OBJECTIVE IMPAIRMENTS Abnormal gait, decreased activity tolerance, decreased balance, decreased endurance, decreased mobility, difficulty walking, decreased strength, improper body mechanics, and pain.  ?   ?ACTIVITY LIMITATIONS cleaning, community activity, meal prep, laundry, yard work, shopping, and yard work.  ?  ?  ?GOALS: ?  ?  ?SHORT TERM GOALS: [place] date: 04/04/2022 ?  ? Patient will be independent with initial HEP and self-man

## 2022-05-03 ENCOUNTER — Encounter (HOSPITAL_COMMUNITY): Payer: Self-pay

## 2022-05-03 ENCOUNTER — Ambulatory Visit (HOSPITAL_COMMUNITY): Payer: Medicare Other

## 2022-05-03 DIAGNOSIS — R262 Difficulty in walking, not elsewhere classified: Secondary | ICD-10-CM

## 2022-05-03 DIAGNOSIS — Z9181 History of falling: Secondary | ICD-10-CM

## 2022-05-03 DIAGNOSIS — M6281 Muscle weakness (generalized): Secondary | ICD-10-CM

## 2022-05-03 DIAGNOSIS — M545 Low back pain, unspecified: Secondary | ICD-10-CM

## 2022-05-03 DIAGNOSIS — R2689 Other abnormalities of gait and mobility: Secondary | ICD-10-CM

## 2022-05-03 NOTE — Therapy (Signed)
?OUTPATIENT PHYSICAL THERAPY TREATMENT NOTE ? ? ?Patient Name: Katherine Middleton ?MRN: [id number] ?DOB:03-Jan-1951, 71 y.o., female ?Today's Date: 05/03/2022 ? ?PCP: Aletha Halim., PA-C ?REFERRING PROVIDER: Aletha Halim., PA-C ? ? ? ? ?END OF SESSION:  ? PT End of Session - 05/03/22 1444   ? ? Visit Number 12   ? Number of Visits 18   ? Date for PT Re-Evaluation 05/26/22   ? Authorization Type UHC updated on 04/26/22 is actually [facility] Medicare and thus follows medicare guidlines   ? Authorization Time Period No auth required, no visit limit   ? Authorization - Visit Number 12   ? Authorization - Number of Visits 18   ? Progress Note Due on Visit 20   ? PT Start Time 6295   ? PT Stop Time 2841   ? PT Time Calculation (min) 39 min   ? Activity Tolerance Patient tolerated treatment well;Patient limited by fatigue   ? Behavior During Therapy [facility] for tasks assessed/performed   ? ?  ?  ? ?  ? ? ? ? ?Past Medical History:  ?Diagnosis Date  ? Anxiety   ? Arthritis   ? Bipolar disorder ([place])   ? Depression   ? Fibromyalgia   ? GERD (gastroesophageal reflux disease) 12/31/2002  ? HLD (hyperlipidemia)   ? diet controlled - no meds  ? HSV infection   ? Liver mass 07/2021  ? Osteoarthritis   ? Prolonged Q-T interval on ECG 02/2021  ? Sinusitis 12/31/2012  ? Wears glasses   ? ?Past Surgical History:  ?Procedure Laterality Date  ? BIOPSY  08/24/2021  ? Procedure: BIOPSY;  Surgeon: Irving Copas., MD;  Location: [place];  Service: Gastroenterology;;  ? BIOPSY  09/11/2021  ? Procedure: BIOPSY;  Surgeon: Irving Copas., MD;  Location: [facility];  Service: Gastroenterology;;  ? ESOPHAGOGASTRODUODENOSCOPY (EGD) WITH PROPOFOL N/A 08/11/2021  ? Procedure: ESOPHAGOGASTRODUODENOSCOPY (EGD) WITH PROPOFOL;  Surgeon: Carol Ada, MD;  [facility];  Service: Endoscopy;  Laterality: N/A;  ? ESOPHAGOGASTRODUODENOSCOPY (EGD) WITH PROPOFOL N/A 08/24/2021  ? Procedure: ESOPHAGOGASTRODUODENOSCOPY (EGD) WITH  PROPOFOL;  Surgeon: Rush Landmark Telford Nab., MD;  Location: [place];  Service: Gastroenterology;  Laterality: N/A;  ? ESOPHAGOGASTRODUODENOSCOPY (EGD) WITH PROPOFOL N/A 09/11/2021  ? Procedure: ESOPHAGOGASTRODUODENOSCOPY (EGD) WITH PROPOFOL;  Surgeon: Rush Landmark Telford Nab., MD;  Location: [facility];  Service: Gastroenterology;  Laterality: N/A;  ? EUS N/A 08/24/2021  ? Procedure: UPPER ENDOSCOPIC ULTRASOUND (EUS) LINEAR;  Surgeon: Rush Landmark Telford Nab., MD;  Location: [place];  Service: Gastroenterology;  Laterality: N/A;  ? EUS N/A 09/11/2021  ? Procedure: UPPER ENDOSCOPIC ULTRASOUND (EUS) LINEAR;  Surgeon: Rush Landmark Telford Nab., MD;  Location: [facility];  Service: Gastroenterology;  Laterality: N/A;  ? FINE NEEDLE ASPIRATION  08/24/2021  ? Procedure: FINE NEEDLE ASPIRATION;  Surgeon: Rush Landmark Telford Nab., MD;  Location: [place];  Service: Gastroenterology;;  ? FINE NEEDLE ASPIRATION  09/11/2021  ? Procedure: FINE NEEDLE ASPIRATION (FNA) LINEAR;  Surgeon: Rush Landmark Telford Nab., MD;  Location: [facility];  Service: Gastroenterology;;  Delaine Lame. not in system to charge  ? TONSILLECTOMY  04/08/57  ? some date around then  ? TUBAL LIGATION  10/21/1984  ? UPPER ESOPHAGEAL ENDOSCOPIC ULTRASOUND (EUS) N/A 08/11/2021  ? Procedure: UPPER ESOPHAGEAL ENDOSCOPIC ULTRASOUND (EUS);  Surgeon: Carol Ada, MD;  Location: [facility];  Service: Endoscopy;  Laterality: N/A;  ? ?Patient Active Problem List  ? Diagnosis Date Noted  ? Back pain, chronic 04/03/2013  ? Anxiety 12/17/2012  ?  Vitamin D insufficiency 12/17/2012  ? GERD (gastroesophageal reflux disease) 11/18/2012  ? Bipolar 1 disorder ([place]) 11/18/2012  ? Fibromyalgia 11/18/2012  ? ? ?REFERRING DIAG: weakness of both LE and UE weakness ? ?THERAPY DIAG:  ?Muscle weakness (generalized) ? ?Other abnormalities of gait and mobility ? ?Low back pain, unspecified back pain laterality, unspecified chronicity, unspecified whether sciatica  present ? ?History of falling ? ?Difficulty in walking, not elsewhere classified ? ?PERTINENT HISTORY: arthritis, fibromyalgia, anxiety, chronic LBP ? ?PRECAUTIONS: fall ? ?SUBJECTIVE: Pt stated she shoulders feel a lot better.  Does have some constant LBP with fibro and some pain in knees today Rt >Lt.  Feels she is getting stronger, no reports of recent falls. ? ?PAIN:   ?Are you having pain? Yes: NPRS scale: 6/10 ?Pain location: back and knees Rt >Lt ?Pain description: tight, aching, sharp ?Aggravating factors: turning head ?Relieving factors: rest, Tylenol  ? ? ? ?OBJECTIVE:  ? ?LE MMT: ?  ?MMT Right ?03/21/2022 Left ?03/21/2022 Right ?04/26/22 Left ?04/26/22  ?Hip flexion '5 5 5 5  '$ ?Hip extension 4 4 4+ 4+  ?Hip abduction 4+ 4+ 4+ 4+  ?Hip adduction        ?Hip internal rotation        ?Hip external rotation        ?Knee flexion        ?Knee extension '5 5 5 5  '$ ?Ankle dorsiflexion '5 5 5 5  '$ ?Ankle plantarflexion        ?Ankle inversion        ?Ankle eversion        ? (Blank rows = not tested) ?  ?  ?  ?FUNCTIONAL TESTS:  ?5 times sit to stand: At eval = 18.8 seconds with use of hands on thighs  ?04/26/22 - 17.5 seconds with no use of hands ?2 minute walk test: At eval = 226 feet using quad cane, decreased stride and noted foot drop on LLE  ? 04/26/22 - 375 feet using quad cane, equal stride length, no foot drop observed  ?Balance: At eval = tandem 4 sec RT, 7 sec LT ? 04/26/22 - B tandem Right and Left both for 22 seconds with no hand assist ?Single leg stance: At eval = unable to maintain without UE assist  ?   04/26/22 - 10 seconds no hand assist on left leg; 5 seconds no hand assist on right leg  ?  ? ? ?TODAY'S TREATMENT: ?05/03/22: ?(Moist heat to neck during first 10 min of treatment, non billed)  ?Heel raise 2 x 10 ?Toe raise 2 x10 ?Vector stance 5x 5" 1 HHA ?5STS x 2 19" then 16.52" ?Step up 7in 2 then 1 HHA 10x 2 ?Squat 10x ?Toe tapping 7in toe tapping 2x 8, able to complete 8 alternating in 12.17"  ?Tandem stance  with head turns x10 ? ?05/01/22 ?    (Moist heat to neck during first 10 min of treatment, non billed)  ?Heel raise 2 x 10 ?Toe raise 2 x10 ?Standing march 2lb x20 each  ?Standing hip abduction 2lb 2 x 10 each ?Standing hip extension 2lb 2 x 10 each ?Standing knee flexion 2 lb 2 x 10 each  ?Step taps 6 inch box x20 each ?Step up 6 inch box x  10 each  ? ?04/26/22:  ?Re-assessment progress note testing ?Seated with cervical heat for subjective and starting movement ? Seated there-ex -  ?  - heel raises x 10  ?  - toe raises  x 10  ?    - LAQ x 10 B alternating ? Re-testing functional tests, see measurements above  ? Seated cervical postural opening trialed all in sitting tall and then standing tall =  ?  - shoulder shrug 2 x 10 ?  - shoulder rolls 2 x 10  ?  - shoulder horizontal abduction to barrel hugs x 10  ? ?04/24/22 ?Heel/ toe raise x20 each  ?Stairs 4 inch single rail reciprocal 3 RT ?Tandem stance with head turns x15 each  (very challenging)  ?Tandem stance 30" holds each   ?FWD and lateral step over hurdles 4 inch hurdles 5 RT each  ?Tandem gait 5 RT  ?Chair squats 2 x 10 ? ?(Moist heat to neck during first 10 min of treatment, non billed)  ?     ? ?PATIENT EDUCATION:  ?Education details: Exercise form and function 04/26/22: re-assessment findings, baseline seated activity for post op safety as well ?Person educated: Patient ?Education method: Explanation and Demonstration ?Education comprehension: verbalized understanding and returned demonstration ?  ?  ?HOME EXERCISE PROGRAM: ?Access Code: [email or web address] ?URL: [email or web address] ?Date: 03/21/2022 ?Prepared by: Josue Hector ?  ?Exercises ?Heel Raises with Counter Support - 3 x daily - 7 x weekly - 2 sets - 10 reps ?Toe Raises with Counter Support - 3 x daily - 7 x weekly - 2 sets - 10 reps ?Standing March with Counter Support - 3 x daily - 7 x weekly - 2 sets - 10 reps ? ?Tandem stance 30x " ?  ?  ?ASSESSMENT: ?  ?CLINICAL IMPRESSION: ?Session  focus with functional strengthening and balalnce training.  Pt progressing well with ability to complete step up training with increased height and ability to complete majority of balance activities with no or minimal ha

## 2022-05-08 ENCOUNTER — Encounter (HOSPITAL_COMMUNITY): Payer: Self-pay | Admitting: Physical Therapy

## 2022-05-08 ENCOUNTER — Ambulatory Visit (HOSPITAL_COMMUNITY): Payer: Medicare Other | Admitting: Physical Therapy

## 2022-05-08 DIAGNOSIS — M545 Low back pain, unspecified: Secondary | ICD-10-CM

## 2022-05-08 DIAGNOSIS — R2689 Other abnormalities of gait and mobility: Secondary | ICD-10-CM

## 2022-05-08 DIAGNOSIS — M6281 Muscle weakness (generalized): Secondary | ICD-10-CM

## 2022-05-08 NOTE — Therapy (Signed)
?OUTPATIENT PHYSICAL THERAPY TREATMENT NOTE ? ? ?Patient Name: Katherine Middleton ?MRN: [id number] ?DOB:Sep 09, 1951, 71 y.o., female ?Today's Date: 05/08/2022 ? ?PCP: Aletha Halim., PA-C ?REFERRING PROVIDER: Aletha Halim., PA-C ? ? ? ? ?END OF SESSION:  ? PT End of Session - 05/08/22 1558   ? ? Visit Number 13   ? Number of Visits 18   ? Date for PT Re-Evaluation 05/26/22   ? Authorization Type UHC updated on 04/26/22 is actually [facility] Medicare and thus follows medicare guidlines   ? Authorization Time Period No auth required, no visit limit   ? Authorization - Visit Number 13   ? Authorization - Number of Visits 18   ? Progress Note Due on Visit 20   ? PT Start Time 8469   ? PT Stop Time 6295   ? PT Time Calculation (min) 39 min   ? Activity Tolerance Patient tolerated treatment well;Patient limited by fatigue   ? Behavior During Therapy [facility] for tasks assessed/performed   ? ?  ?  ? ?  ? ? ? ? ?Past Medical History:  ?Diagnosis Date  ? Anxiety   ? Arthritis   ? Bipolar disorder ([place])   ? Depression   ? Fibromyalgia   ? GERD (gastroesophageal reflux disease) 12/31/2002  ? HLD (hyperlipidemia)   ? diet controlled - no meds  ? HSV infection   ? Liver mass 07/2021  ? Osteoarthritis   ? Prolonged Q-T interval on ECG 02/2021  ? Sinusitis 12/31/2012  ? Wears glasses   ? ?Past Surgical History:  ?Procedure Laterality Date  ? BIOPSY  08/24/2021  ? Procedure: BIOPSY;  Surgeon: Irving Copas., MD;  Location: [place];  Service: Gastroenterology;;  ? BIOPSY  09/11/2021  ? Procedure: BIOPSY;  Surgeon: Irving Copas., MD;  Location: [facility];  Service: Gastroenterology;;  ? ESOPHAGOGASTRODUODENOSCOPY (EGD) WITH PROPOFOL N/A 08/11/2021  ? Procedure: ESOPHAGOGASTRODUODENOSCOPY (EGD) WITH PROPOFOL;  Surgeon: Carol Ada, MD;  Location: [facility];  Service: Endoscopy;  Laterality: N/A;  ? ESOPHAGOGASTRODUODENOSCOPY (EGD) WITH PROPOFOL N/A 08/24/2021  ? Procedure: ESOPHAGOGASTRODUODENOSCOPY (EGD) WITH  PROPOFOL;  Surgeon: Rush Landmark Telford Nab., MD;  Location: [place];  Service: Gastroenterology;  Laterality: N/A;  ? ESOPHAGOGASTRODUODENOSCOPY (EGD) WITH PROPOFOL N/A 09/11/2021  ? Procedure: ESOPHAGOGASTRODUODENOSCOPY (EGD) WITH PROPOFOL;  Surgeon: Rush Landmark Telford Nab., MD;  Location: [facility];  Service: Gastroenterology;  Laterality: N/A;  ? EUS N/A 08/24/2021  ? Procedure: UPPER ENDOSCOPIC ULTRASOUND (EUS) LINEAR;  Surgeon: Rush Landmark Telford Nab., MD;  Location: [place];  Service: Gastroenterology;  Laterality: N/A;  ? EUS N/A 09/11/2021  ? Procedure: UPPER ENDOSCOPIC ULTRASOUND (EUS) LINEAR;  Surgeon: Rush Landmark Telford Nab., MD;  [facility]: [facility];  Service: Gastroenterology;  Laterality: N/A;  ? FINE NEEDLE ASPIRATION  08/24/2021  ? Procedure: FINE NEEDLE ASPIRATION;  Surgeon: Rush Landmark Telford Nab., MD;  Location: [place];  Service: Gastroenterology;;  ? FINE NEEDLE ASPIRATION  09/11/2021  ? Procedure: FINE NEEDLE ASPIRATION (FNA) LINEAR;  Surgeon: Rush Landmark Telford Nab., MD;  Location: [facility];  Service: Gastroenterology;;  Delaine Lame. not in system to charge  ? TONSILLECTOMY  04/08/57  ? some date around then  ? TUBAL LIGATION  10/21/1984  ? UPPER ESOPHAGEAL ENDOSCOPIC ULTRASOUND (EUS) N/A 08/11/2021  ? Procedure: UPPER ESOPHAGEAL ENDOSCOPIC ULTRASOUND (EUS);  Surgeon: Carol Ada, MD;  Location: [facility];  Service: Endoscopy;  Laterality: N/A;  ? ?Patient Active Problem List  ? Diagnosis Date Noted  ? Back pain, chronic 04/03/2013  ? Anxiety 12/17/2012  ?  Vitamin D insufficiency 12/17/2012  ? GERD (gastroesophageal reflux disease) 11/18/2012  ? Bipolar 1 disorder ([place]) 11/18/2012  ? Fibromyalgia 11/18/2012  ? ? ?REFERRING DIAG: weakness of both LE and UE weakness ? ?THERAPY DIAG:  ?Muscle weakness (generalized) ? ?Other abnormalities of gait and mobility ? ?Low back pain, unspecified back pain laterality, unspecified chronicity, unspecified whether sciatica  present ? ?PERTINENT HISTORY: arthritis, fibromyalgia, anxiety, chronic LBP ? ?PRECAUTIONS: fall ? ?SUBJECTIVE: Constant pain in back due to fibro and arthritis. No new issues.  ? ?PAIN:   ?Are you having pain? Yes: NPRS scale: 4/10 ?Pain location: back and knees Rt >Lt ?Pain description: tight, aching, sharp ?Aggravating factors: turning head ?Relieving factors: rest, Tylenol  ? ? ? ?OBJECTIVE:  ? ?LE MMT: ?  ?MMT Right ?03/21/2022 Left ?03/21/2022 Right ?04/26/22 Left ?04/26/22  ?Hip flexion '5 5 5 5  ' ?Hip extension 4 4 4+ 4+  ?Hip abduction 4+ 4+ 4+ 4+  ?Hip adduction        ?Hip internal rotation        ?Hip external rotation        ?Knee flexion        ?Knee extension '5 5 5 5  ' ?Ankle dorsiflexion '5 5 5 5  ' ?Ankle plantarflexion        ?Ankle inversion        ?Ankle eversion        ? (Blank rows = not tested) ?  ?  ?  ?FUNCTIONAL TESTS:  ?5 times sit to stand: At eval = 18.8 seconds with use of hands on thighs  ?04/26/22 - 17.5 seconds with no use of hands ?2 minute walk test: At eval = 226 feet using quad cane, decreased stride and noted foot drop on LLE  ? 04/26/22 - 375 feet using quad cane, equal stride length, no foot drop observed  ?Balance: At eval = tandem 4 sec RT, 7 sec LT ? 04/26/22 - B tandem Right and Left both for 22 seconds with no hand assist ?Single leg stance: At eval = unable to maintain without UE assist  ?   04/26/22 - 10 seconds no hand assist on left leg; 5 seconds no hand assist on right leg  ?  ? ? ?TODAY'S TREATMENT: ?05/08/22 ?Nu step lv 2 (Seat 7) 6 min   ?Stairs 7 inch 5 RT reciprocal single rail  ?Weighted walkouts 2 plates x 5 each  ?Tandem stance with head turns x20 ?Tandem gait 3RT ?Gait with head turns 3RT ? ? ?05/03/22: ?(Moist heat to neck during first 10 min of treatment, non billed)  ?Heel raise 2 x 10 ?Toe raise 2 x10 ?Vector stance 5x 5" 1 HHA ?5STS x 2 19" then 16.52" ?Step up 7in 2 then 1 HHA 10x 2 ?Squat 10x ?Toe tapping 7in toe tapping 2x 8, able to complete 8 alternating in 12.17"   ?Tandem stance with head turns x10 ? ?05/01/22 ?    (Moist heat to neck during first 10 min of treatment, non billed)  ?Heel raise 2 x 10 ?Toe raise 2 x10 ?Standing march 2lb x20 each  ?Standing hip abduction 2lb 2 x 10 each ?Standing hip extension 2lb 2 x 10 each ?Standing knee flexion 2 lb 2 x 10 each  ?Step taps 6 inch box x20 each ?Step up 6 inch box x  10 each  ? ? ?PATIENT EDUCATION:  ?Education details: Exercise form and function 04/26/22: re-assessment findings, baseline seated activity for post op safety as well ?  Person educated: Patient ?Education method: Explanation and Demonstration ?Education comprehension: verbalized understanding and returned demonstration ?  ?  ?HOME EXERCISE PROGRAM: ?Access Code: [email or web address] ?URL: [email or web address] ?Date: 03/21/2022 ?Prepared by: Josue Hector ?  ?Exercises ?Heel Raises with Counter Support - 3 x daily - 7 x weekly - 2 sets - 10 reps ?Toe Raises with Counter Support - 3 x daily - 7 x weekly - 2 sets - 10 reps ?Standing March with Counter Support - 3 x daily - 7 x weekly - 2 sets - 10 reps ? ?Tandem stance 30x " ?  ?  ?ASSESSMENT: ?  ?CLINICAL IMPRESSION: ?   Patient tolerated session well today. Showing improved activity tolerance. No rest breaks taken today. Continues to be challenged with balance activity and requires CG for more challenging dynamic balance activity. LOB x 1 during walkouts, also during tandem gait but corrected with min guard assist. Patient will continue to benefit from skilled therapy services to reduce remaining deficits and improve functional ability.  ? ? ?OBJECTIVE IMPAIRMENTS Abnormal gait, decreased activity tolerance, decreased balance, decreased endurance, decreased mobility, difficulty walking, decreased strength, improper body mechanics, and pain.  ?  ?ACTIVITY LIMITATIONS cleaning, community activity, meal prep, laundry, yard work, shopping, and yard work.  ?  ?  ?GOALS: ?  ?  ?SHORT TERM GOALS: Target date:  04/04/2022 ?  ? Patient will be independent with initial HEP and self-management strategies to improve functional outcomes ?Baseline:  ?Goal status: MET! ?  ?LONG TERM GOALS: Target date: 05/02/22  Reviesed to 05/26/22 ?  ?Patient

## 2022-05-15 ENCOUNTER — Ambulatory Visit (HOSPITAL_COMMUNITY): Payer: Medicare Other | Admitting: Physical Therapy

## 2022-05-15 DIAGNOSIS — M545 Low back pain, unspecified: Secondary | ICD-10-CM

## 2022-05-15 DIAGNOSIS — M6281 Muscle weakness (generalized): Secondary | ICD-10-CM

## 2022-05-15 DIAGNOSIS — R2689 Other abnormalities of gait and mobility: Secondary | ICD-10-CM

## 2022-05-15 NOTE — Therapy (Signed)
?OUTPATIENT PHYSICAL THERAPY TREATMENT NOTE ? ? ?Patient Name: Katherine Middleton ?MRN: [id number] ?DOB:Jan 13, 1951, 71 y.o., female ?Today's Date: 05/15/2022 ? ?PCP: Aletha Halim., PA-C ?REFERRING PROVIDER: Aletha Halim., PA-C ? ? ? ? ?END OF SESSION:  ? PT End of Session - 05/15/22 1150   ? ? Visit Number 14   ? Number of Visits 18   ? Date for PT Re-Evaluation 05/26/22   ? Authorization Type UHC updated on 04/26/22 is actually [facility] Medicare and thus follows medicare guidlines   ? Authorization Time Period No auth required, no visit limit   ? Authorization - Visit Number 14   ? Authorization - Number of Visits 18   ? Progress Note Due on Visit 20   ? PT Start Time 1134   ? PT Stop Time 1217   ? PT Time Calculation (min) 43 min   ? Activity Tolerance Patient tolerated treatment well;Patient limited by fatigue   ? Behavior During Therapy [facility] for tasks assessed/performed   ? ?  ?  ? ?  ? ? ? ? ?Past Medical History:  ?Diagnosis Date  ? Anxiety   ? Arthritis   ? Bipolar disorder ([place])   ? Depression   ? Fibromyalgia   ? GERD (gastroesophageal reflux disease) 12/31/2002  ? HLD (hyperlipidemia)   ? diet controlled - no meds  ? HSV infection   ? Liver mass 07/2021  ? Osteoarthritis   ? Prolonged Q-T interval on ECG 02/2021  ? Sinusitis 12/31/2012  ? Wears glasses   ? ?Past Surgical History:  ?Procedure Laterality Date  ? BIOPSY  08/24/2021  ? Procedure: BIOPSY;  Surgeon: Irving Copas., MD;  Location: [place];  Service: Gastroenterology;;  ? BIOPSY  09/11/2021  ? Procedure: BIOPSY;  Surgeon: Irving Copas., MD;  Location: [facility];  Service: Gastroenterology;;  ? ESOPHAGOGASTRODUODENOSCOPY (EGD) WITH PROPOFOL N/A 08/11/2021  ? Procedure: ESOPHAGOGASTRODUODENOSCOPY (EGD) WITH PROPOFOL;  Surgeon: Carol Ada, MD;  [facility];  Service: Endoscopy;  Laterality: N/A;  ? ESOPHAGOGASTRODUODENOSCOPY (EGD) WITH PROPOFOL N/A 08/24/2021  ? Procedure: ESOPHAGOGASTRODUODENOSCOPY (EGD) WITH  PROPOFOL;  Surgeon: Rush Landmark Telford Nab., MD;  Location: [place];  Service: Gastroenterology;  Laterality: N/A;  ? ESOPHAGOGASTRODUODENOSCOPY (EGD) WITH PROPOFOL N/A 09/11/2021  ? Procedure: ESOPHAGOGASTRODUODENOSCOPY (EGD) WITH PROPOFOL;  Surgeon: Rush Landmark Telford Nab., MD;  Location: [facility];  Service: Gastroenterology;  Laterality: N/A;  ? EUS N/A 08/24/2021  ? Procedure: UPPER ENDOSCOPIC ULTRASOUND (EUS) LINEAR;  Surgeon: Rush Landmark Telford Nab., MD;  Location: [place];  Service: Gastroenterology;  Laterality: N/A;  ? EUS N/A 09/11/2021  ? Procedure: UPPER ENDOSCOPIC ULTRASOUND (EUS) LINEAR;  Surgeon: Rush Landmark Telford Nab., MD;  Location: [facility];  Service: Gastroenterology;  Laterality: N/A;  ? FINE NEEDLE ASPIRATION  08/24/2021  ? Procedure: FINE NEEDLE ASPIRATION;  Surgeon: Rush Landmark Telford Nab., MD;  Location: [place];  Service: Gastroenterology;;  ? FINE NEEDLE ASPIRATION  09/11/2021  ? Procedure: FINE NEEDLE ASPIRATION (FNA) LINEAR;  Surgeon: Rush Landmark Telford Nab., MD;  Location: [facility];  Service: Gastroenterology;;  Delaine Lame. not in system to charge  ? TONSILLECTOMY  04/08/57  ? some date around then  ? TUBAL LIGATION  10/21/1984  ? UPPER ESOPHAGEAL ENDOSCOPIC ULTRASOUND (EUS) N/A 08/11/2021  ? Procedure: UPPER ESOPHAGEAL ENDOSCOPIC ULTRASOUND (EUS);  Surgeon: Carol Ada, MD;  Location: [facility];  Service: Endoscopy;  Laterality: N/A;  ? ?Patient Active Problem List  ? Diagnosis Date Noted  ? Back pain, chronic 04/03/2013  ? Anxiety 12/17/2012  ?  Vitamin D insufficiency 12/17/2012  ? GERD (gastroesophageal reflux disease) 11/18/2012  ? Bipolar 1 disorder ([place]) 11/18/2012  ? Fibromyalgia 11/18/2012  ? ? ?REFERRING DIAG: weakness of both LE and UE weakness ? ?THERAPY DIAG:  ?Muscle weakness (generalized) ? ?Other abnormalities of gait and mobility ? ?Low back pain, unspecified back pain laterality, unspecified chronicity, unspecified whether sciatica  present ? ?PERTINENT HISTORY: arthritis, fibromyalgia, anxiety, chronic LBP ? ?PRECAUTIONS: fall ? ?SUBJECTIVE: Pt states she is still having pain in her neck and no pain in her back.  Neck pain is 6/10.  Pt requested a moist heat back during session for this .   ? ?PAIN:   ?Are you having pain? Yes: NPRS scale: 0/10 ?Pain location: back and knees Rt >Lt ?Pain description: tight, aching, sharp ?Aggravating factors: turning head ?Relieving factors: rest, Tylenol  ? ? ? ?OBJECTIVE:  ? ?LE MMT: ?  ?MMT Right ?03/21/2022 Left ?03/21/2022 Right ?04/26/22 Left ?04/26/22  ?Hip flexion '5 5 5 5  '$ ?Hip extension 4 4 4+ 4+  ?Hip abduction 4+ 4+ 4+ 4+  ?Hip adduction        ?Hip internal rotation        ?Hip external rotation        ?Knee flexion        ?Knee extension '5 5 5 5  '$ ?Ankle dorsiflexion '5 5 5 5  '$ ?Ankle plantarflexion        ?Ankle inversion        ?Ankle eversion        ? (Blank rows = not tested) ?  ?  ?  ?FUNCTIONAL TESTS:  ?5 times sit to stand: At eval = 18.8 seconds with use of hands on thighs  ?04/26/22 - 17.5 seconds with no use of hands ?2 minute walk test: At eval = 226 feet using quad cane, decreased stride and noted foot drop on LLE  ? 04/26/22 - 375 feet using quad cane, equal stride length, no foot drop observed  ?Balance: At eval = tandem 4 sec RT, 7 sec LT ? 04/26/22 - B tandem Right and Left both for 22 seconds with no hand assist ?Single leg stance: At eval = unable to maintain without UE assist  ?   04/26/22 - 10 seconds no hand assist on left leg; 5 seconds no hand assist on right leg  ?  ? ? ?TODAY'S TREATMENT: ?05/15/22 ?Nu step lv 2 (Seat 7) 6 min  LE only with moist heat on neck ?Tandem stance with head turns 30" each ?Tandem stance static no UE 30" each ?Heel and toe raises 15X each ?Stairs 7 inch 5 RT reciprocal single rail  ?Weighted walkouts 2 plates x 5 each  ?Tandem gait 4RT in parallel bars ?Gait with head turns 3RT ? ?05/08/22 ?Nu step lv 2 (Seat 7) 6 min   ?Stairs 7 inch 5 RT reciprocal single  rail  ?Weighted walkouts 2 plates x 5 each  ?Tandem stance with head turns x20 ?Tandem gait 3RT ?Gait with head turns 3RT ? ? ?05/03/22: ?(Moist heat to neck during first 10 min of treatment, non billed)  ?Heel raise 2 x 10 ?Toe raise 2 x10 ?Vector stance 5x 5" 1 HHA ?5STS x 2 19" then 16.52" ?Step up 7in 2 then 1 HHA 10x 2 ?Squat 10x ?Toe tapping 7in toe tapping 2x 8, able to complete 8 alternating in 12.17"  ?Tandem stance with head turns x10 ? ?05/01/22 ?    (Moist heat to neck during first  10 min of treatment, non billed)  ?Heel raise 2 x 10 ?Toe raise 2 x10 ?Standing march 2lb x20 each  ?Standing hip abduction 2lb 2 x 10 each ?Standing hip extension 2lb 2 x 10 each ?Standing knee flexion 2 lb 2 x 10 each  ?Step taps 6 inch box x20 each ?Step up 6 inch box x  10 each  ? ? ?PATIENT EDUCATION:  ?Education details: Exercise form and function 04/26/22: re-assessment findings, baseline seated activity for post op safety as well ?Person educated: Patient ?Education method: Explanation and Demonstration ?Education comprehension: verbalized understanding and returned demonstration ?  ?  ?HOME EXERCISE PROGRAM: ?Access Code: [email or web address] ?URL: [email or web address] ?Date: 03/21/2022 ?Prepared by: Josue Hector ?  ?Exercises ?Heel Raises with Counter Support - 3 x daily - 7 x weekly - 2 sets - 10 reps ?Toe Raises with Counter Support - 3 x daily - 7 x weekly - 2 sets - 10 reps ?Standing March with Counter Support - 3 x daily - 7 x weekly - 2 sets - 10 reps ? ?Tandem stance 30x " ?  ?  ?ASSESSMENT: ?  ?CLINICAL IMPRESSION: ?Patient unable to complete head turns in tandem without UE assist.  Pt with more stability with Rt LE leading rather than Lt.  Weighted walkouts remain challenging for stability as well but able to self correct LOB.   Pt with less head turn today with activities due to neck pain. PT wore heat around her neck for most of treatment today. No rest breaks taken today. Patient will continue to benefit  from skilled therapy services to reduce remaining deficits and improve functional ability.  ? ? ?OBJECTIVE IMPAIRMENTS Abnormal gait, decreased activity tolerance, decreased balance, decreased endurance, decreased mob

## 2022-05-17 ENCOUNTER — Ambulatory Visit (HOSPITAL_COMMUNITY): Payer: Medicare Other

## 2022-05-17 DIAGNOSIS — R2689 Other abnormalities of gait and mobility: Secondary | ICD-10-CM

## 2022-05-17 DIAGNOSIS — Z9181 History of falling: Secondary | ICD-10-CM

## 2022-05-17 DIAGNOSIS — R262 Difficulty in walking, not elsewhere classified: Secondary | ICD-10-CM

## 2022-05-17 DIAGNOSIS — M545 Low back pain, unspecified: Secondary | ICD-10-CM

## 2022-05-17 DIAGNOSIS — M6281 Muscle weakness (generalized): Secondary | ICD-10-CM

## 2022-05-17 NOTE — Therapy (Signed)
OUTPATIENT PHYSICAL THERAPY TREATMENT NOTE   Patient Name: Luis Sami MRN: [id number] DOB:1951-04-02, 71 y.o., female Today's Date: 05/17/2022  PCP: Aletha Halim., PA-C REFERRING PROVIDER: Aletha Halim., PA-C     END OF SESSION:   PT End of Session - 05/17/22 1346     Visit Number 15    Number of Visits 18    Date for PT Re-Evaluation 05/26/22    Authorization Type UHC updated on 04/26/22 is actually [facility] Medicare and thus follows medicare guidlines    Authorization Time Period No auth required, no visit limit    Authorization - Visit Number 15    Authorization - Number of Visits 18    Progress Note Due on Visit 20    PT Start Time 1345    PT Stop Time 1424    PT Time Calculation (min) 39 min    Activity Tolerance Patient tolerated treatment well;Patient limited by fatigue    Behavior During Therapy [facility] for tasks assessed/performed                Past Medical History:  Diagnosis Date   Anxiety    Arthritis    Bipolar disorder ([place])    Depression    Fibromyalgia    GERD (gastroesophageal reflux disease) 12/31/2002   HLD (hyperlipidemia)    diet controlled - no meds   HSV infection    Liver mass 07/2021   Osteoarthritis    Prolonged Q-T interval on ECG 02/2021   Sinusitis 12/31/2012   Wears glasses    Past Surgical History:  Procedure Laterality Date   BIOPSY  08/24/2021   Procedure: BIOPSY;  Surgeon: Irving Copas., MD;  Location: [place];  Service: Gastroenterology;;   BIOPSY  09/11/2021   Procedure: BIOPSY;  Surgeon: Irving Copas., MD;  [facility];  Service: Gastroenterology;;   ESOPHAGOGASTRODUODENOSCOPY (EGD) WITH PROPOFOL N/A 08/11/2021   Procedure: ESOPHAGOGASTRODUODENOSCOPY (EGD) WITH PROPOFOL;  Surgeon: Carol Ada, MD;  [facility];  Service: Endoscopy;  Laterality: N/A;   ESOPHAGOGASTRODUODENOSCOPY (EGD) WITH PROPOFOL N/A 08/24/2021   Procedure: ESOPHAGOGASTRODUODENOSCOPY (EGD) WITH  PROPOFOL;  Surgeon: Rush Landmark Telford Nab., MD;  Location: [place];  Service: Gastroenterology;  Laterality: N/A;   ESOPHAGOGASTRODUODENOSCOPY (EGD) WITH PROPOFOL N/A 09/11/2021   Procedure: ESOPHAGOGASTRODUODENOSCOPY (EGD) WITH PROPOFOL;  Surgeon: Rush Landmark Telford Nab., MD;  [facility];  Service: Gastroenterology;  Laterality: N/A;   EUS N/A 08/24/2021   Procedure: UPPER ENDOSCOPIC ULTRASOUND (EUS) LINEAR;  Surgeon: Irving Copas., MD;  Location: [place];  Service: Gastroenterology;  Laterality: N/A;   EUS N/A 09/11/2021   Procedure: UPPER ENDOSCOPIC ULTRASOUND (EUS) LINEAR;  Surgeon: Irving Copas., MD;  [facility];  Service: Gastroenterology;  Laterality: N/A;   FINE NEEDLE ASPIRATION  08/24/2021   Procedure: FINE NEEDLE ASPIRATION;  Surgeon: Rush Landmark Telford Nab., MD;  Location: [place];  Service: Gastroenterology;;   FINE NEEDLE ASPIRATION  09/11/2021   Procedure: FINE NEEDLE ASPIRATION (FNA) LINEAR;  Surgeon: Irving Copas., MD;  [facility];  Service: Gastroenterology;;  Delaine Lame. not in system to charge   TONSILLECTOMY  04/08/57   some date around then   TUBAL LIGATION  10/21/1984   UPPER ESOPHAGEAL ENDOSCOPIC ULTRASOUND (EUS) N/A 08/11/2021   Procedure: UPPER ESOPHAGEAL ENDOSCOPIC ULTRASOUND (EUS);  Surgeon: Carol Ada, MD;  [facility];  Service: Endoscopy;  Laterality: N/A;   Patient Active Problem List   Diagnosis Date Noted   Back pain, chronic 04/03/2013   Anxiety  12/17/2012   Vitamin D insufficiency 12/17/2012   GERD (gastroesophageal reflux disease) 11/18/2012   Bipolar 1 disorder ([place]) 11/18/2012   Fibromyalgia 11/18/2012    REFERRING DIAG: weakness of both LE and UE weakness  THERAPY DIAG:  History of falling  Muscle weakness (generalized)  Other abnormalities of gait and mobility  Difficulty in walking, not elsewhere classified  Low back pain, unspecified back pain  laterality, unspecified chronicity, unspecified whether sciatica present  PERTINENT HISTORY: arthritis, fibromyalgia, anxiety, chronic LBP  PRECAUTIONS: fall  SUBJECTIVE: Pt states her neck is moving better today but still having some pain; also having some pain in her back. PAIN:   Are you having pain? Yes: NPRS scale: 4-5/10 Pain location: back and neck Pain description: tight, aching, sharp Aggravating factors: turning head Relieving factors: rest, Tylenol     OBJECTIVE:   LE MMT:   MMT Right 03/21/2022 Left 03/21/2022 Right 04/26/22 Left 04/26/22  Hip flexion '5 5 5 5  ' Hip extension 4 4 4+ 4+  Hip abduction 4+ 4+ 4+ 4+  Hip adduction        Hip internal rotation        Hip external rotation        Knee flexion        Knee extension '5 5 5 5  ' Ankle dorsiflexion '5 5 5 5  ' Ankle plantarflexion        Ankle inversion        Ankle eversion         (Blank rows = not tested)       FUNCTIONAL TESTS:  5 times sit to stand: At eval = 18.8 seconds with use of hands on thighs  04/26/22 - 17.5 seconds with no use of hands 2 minute walk test: At eval = 226 feet using quad cane, decreased stride and noted foot drop on LLE   04/26/22 - 375 feet using quad cane, equal stride length, no foot drop observed  Balance: At eval = tandem 4 sec RT, 7 sec LT  04/26/22 - B tandem Right and Left both for 22 seconds with no hand assist Single leg stance: At eval = unable to maintain without UE assist     04/26/22 - 10 seconds no hand assist on left leg; 5 seconds no hand assist on right leg      TODAY'S TREATMENT:  05/17/22  Nu step lv 2 (Seat 7) 7 min  LE only with moist heat on neck  Tandem stance static no UE 2 x  30" each Heel and toe raises 2 x 10 each 6" mini lunge without UE assist x 10 each leg Foam march x 30" Foam stand with small BOS with bilat UE flexion Tandem walking in // bars x 3 passes Sit to stand x 10 no UE assist 5 x sit to stand in 22 sec today Sit to stand on foam x  10 no UE assist   05/15/22 Nu step lv 2 (Seat 7) 6 min  LE only with moist heat on neck Tandem stance with head turns 30" each Tandem stance static no UE 30" each Heel and toe raises 15X each Stairs 7 inch 5 RT reciprocal single rail  Weighted walkouts 2 plates x 5 each  Tandem gait 4RT in parallel bars Gait with head turns 3RT  05/08/22 Nu step lv 2 (Seat 7) 6 min   Stairs 7 inch 5 RT reciprocal single rail  Weighted walkouts 2 plates x 5 each  Tandem  stance with head turns x20 Tandem gait 3RT Gait with head turns 3RT   05/03/22: (Moist heat to neck during first 10 min of treatment, non billed)  Heel raise 2 x 10 Toe raise 2 x10 Vector stance 5x 5" 1 HHA 5STS x 2 19" then 16.52" Step up 7in 2 then 1 HHA 10x 2 Squat 10x Toe tapping 7in toe tapping 2x 8, able to complete 8 alternating in 12.17"  Tandem stance with head turns x10   PATIENT EDUCATION:  Education details: Exercise form and function 04/26/22: re-assessment findings, baseline seated activity for post op safety as well Person educated: Patient Education method: Explanation and Demonstration Education comprehension: verbalized understanding and returned demonstration     HOME EXERCISE PROGRAM: Access Code: [email or web address] URL: [email or web address] Date: 03/21/2022 Prepared by: Josue Hector   Exercises Heel Raises with Counter Support - 3 x daily - 7 x weekly - 2 sets - 10 reps Toe Raises with Counter Support - 3 x daily - 7 x weekly - 2 sets - 10 reps Standing March with Counter Support - 3 x daily - 7 x weekly - 2 sets - 10 reps  Tandem stance 30x "     ASSESSMENT:   CLINICAL IMPRESSION: Today's session continues to work on strengthening and balance.  She reports no knee pain today at start of session but continued pain and stiffness in neck and low back. Added sit to stand on foam and foam marching today to continue to work on balance; has to occasionally use hands to maintain balance with  tandem stance today.    Patient will continue to benefit from skilled therapy services to reduce remaining deficits and improve functional ability.    OBJECTIVE IMPAIRMENTS Abnormal gait, decreased activity tolerance, decreased balance, decreased endurance, decreased mobility, difficulty walking, decreased strength, improper body mechanics, and pain.    ACTIVITY LIMITATIONS cleaning, community activity, meal prep, laundry, yard work, shopping, and yard work.      GOALS:     SHORT TERM GOALS: Target date: 04/04/2022    Patient will be independent with initial HEP and self-management strategies to improve functional outcomes Baseline:  Goal status: MET!   LONG TERM GOALS: Target date: 05/02/22  Reviesed to 05/26/22   Patient will be independent with advanced HEP and self-management strategies to improve functional outcomes Baseline:  Goal status: ongoing   2. Patient will be able to perform stand x 5 in < 15 seconds to demonstrate improvement in functional mobility and reduced risk for falls.  REVISED to under 11 seconds  Goal status: ongoing   3.  Patient will report reduction of back pain to <3/10 at rest for improved quality of life and ability to perform ADLs  Baseline:  Goal status: ongoing   4. Patient will be able to maintain tandem stance >30 seconds on BLEs to improve stability and reduce risk for falls Baseline:  Goal status: ongoing   5. Patient will be able to ambulate at least 325 feet during 2MWT with LRAD to demonstrate improved ability to perform functional mobility and associated tasks. Baseline:  Goal status: MET!   PLAN: PT FREQUENCY: additional 2x/week   PT DURATION: additional 4 weeks   PLANNED INTERVENTIONS: Therapeutic exercises, Therapeutic activity, Neuromuscular re-education, Balance training, Gait training, Patient/Family education, Joint manipulation, Joint mobilization, Stair training, Aquatic Therapy, Dry Needling, Electrical stimulation, Spinal  manipulation, Spinal mobilization, Cryotherapy, Moist heat, scar mobilization, Taping, Traction, Ultrasound, Biofeedback, Ionotophoresis 78m/ml Dexamethasone, and Manual therapy.     PLAN  FOR NEXT SESSION: sees her surgeon Monday. Progress functional LE strength, balance, gait and stairs.   2:31 PM, 05/17/22 Marlin Jarrard Small Kadir Azucena MPT [place] [facility]  [phone]

## 2022-05-20 IMAGING — MR MR HEAD W/O CM
7 of 11 series · 25 of 48 positions shown · non-contrast
Comparison: None.

CLINICAL DATA: Mental status change

EXAM:
MRI HEAD WITHOUT CONTRAST
TECHNIQUE: Multiplanar, multiecho pulse sequences of the brain and surrounding
structures were obtained without intravenous contrast.

[Series 2: DWI · axial · 3.0mm · 0.94mm/px · z∈[-87,+61]mm · 7 of 106 slices shown (1 of 2)]
[im 1/106]
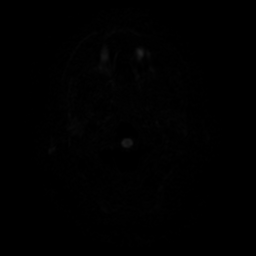
[im 18/106]
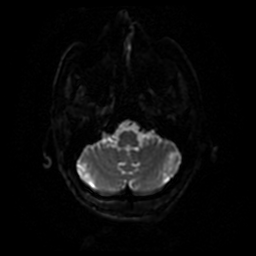
[im 36/106]
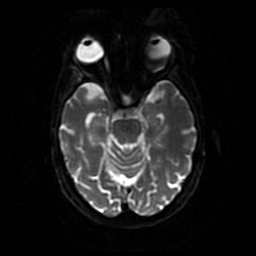
[im 53/106]
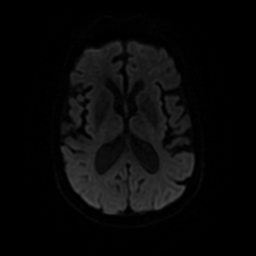
[im 71/106]
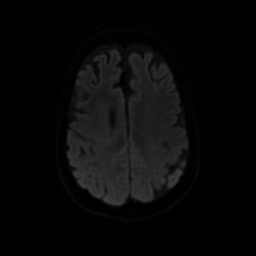
[im 88/106]
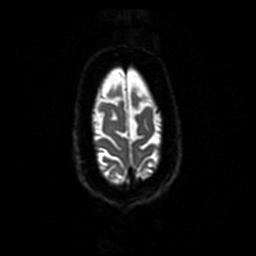
[im 106/106]
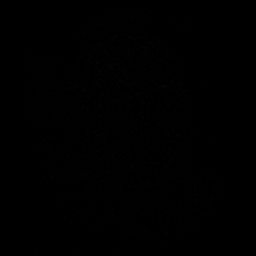

[Series 3: DWI · coronal · 4.0mm · 0.94mm/px · 6 of 76 slices shown (2 of 2)]
[im 1/76]
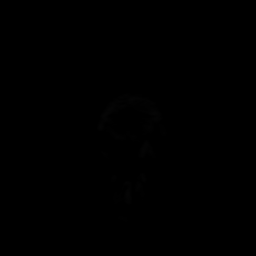
[im 16/76]
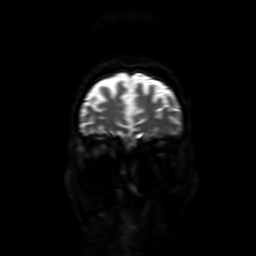
[im 31/76]
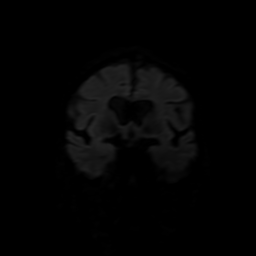
[im 46/76]
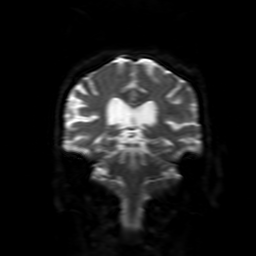
[im 61/76]
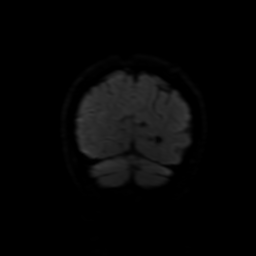
[im 76/76]
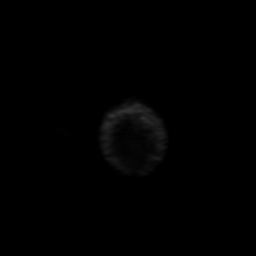

[Series 4: FLAIR · sagittal · 5.0mm · 0.23mm/px · 2 of 26 slices shown (1 of 2)]
[im 1/26]
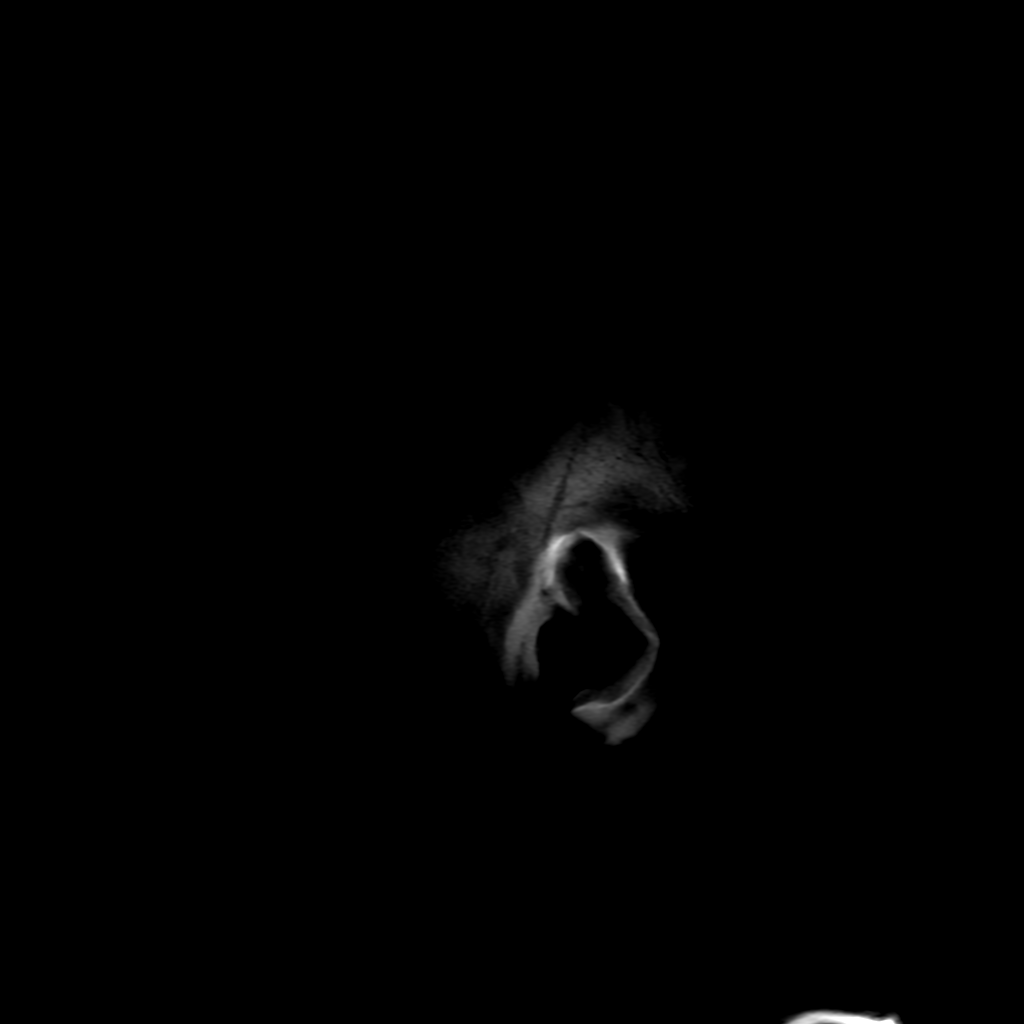
[im 26/26]
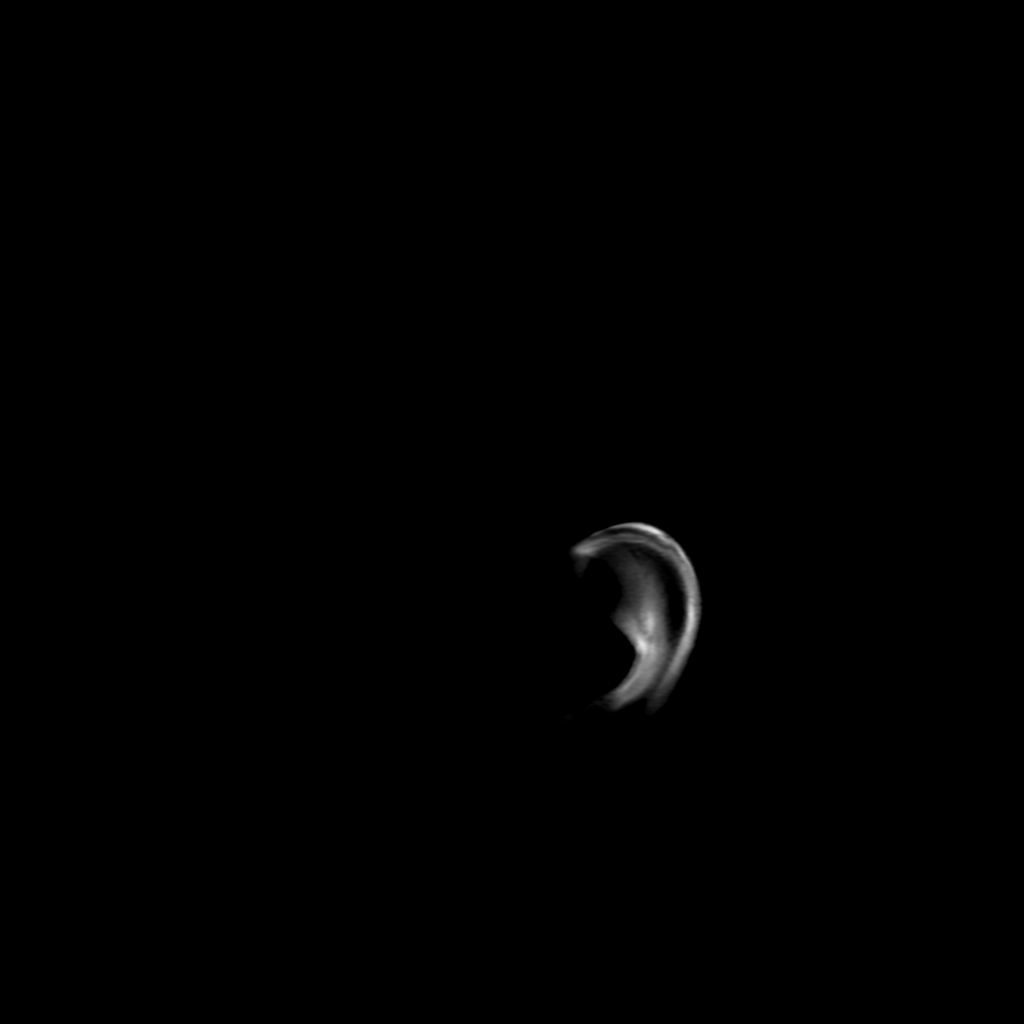

[Series 5: T2 · axial · 5.0mm · 0.23mm/px · 1 of 27 slices shown]
[im 1/27]
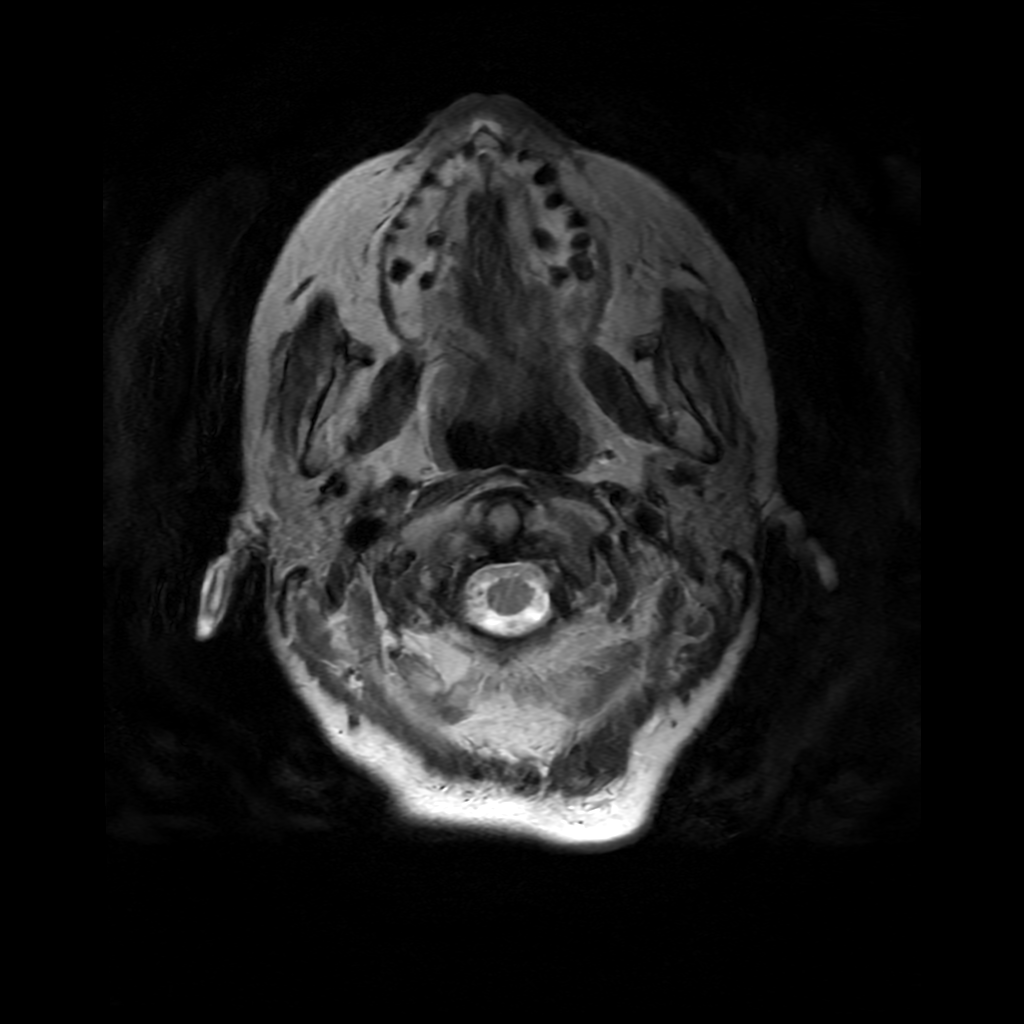

[Series 6: FLAIR · axial · 3.0mm · 0.45mm/px · z∈[-89,+51]mm · 2 of 26 slices shown (2 of 2)]
[im 1/26]
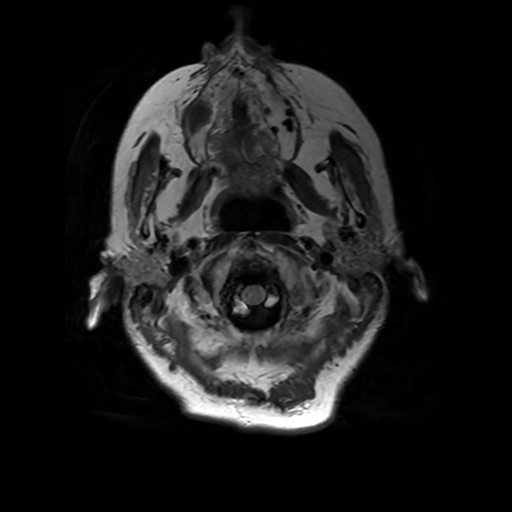
[im 26/26]
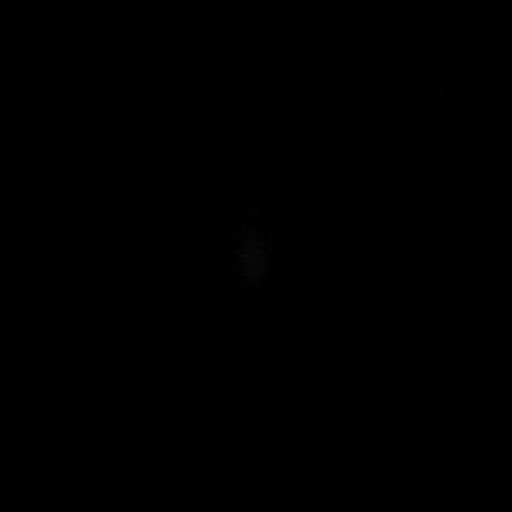

[Series 250: ADC · axial · 3.0mm · 0.94mm/px · z∈[-87,+61]mm · 4 of 53 slices shown (1 of 2)]
[im 1/53]
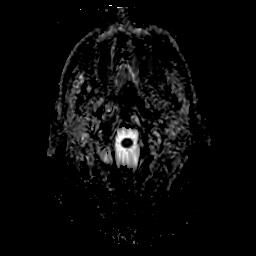
[im 18/53]
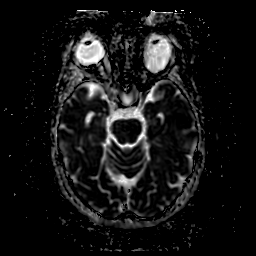
[im 35/53]
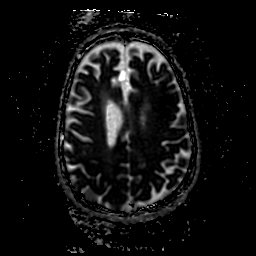
[im 53/53]
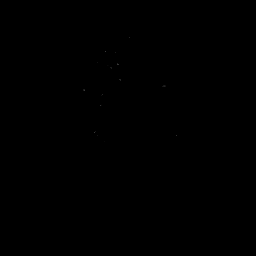

[Series 350: ADC · coronal · 4.0mm · 0.94mm/px · 3 of 38 slices shown (2 of 2)]
[im 1/38]
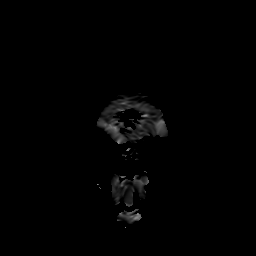
[im 19/38]
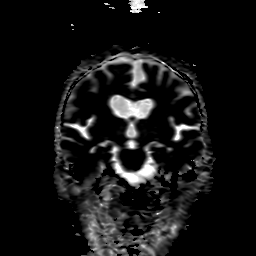
[im 38/38]
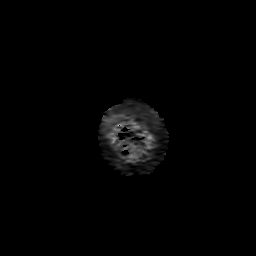

[25 of 48 positions shown; findings below may reference images not displayed]

FINDINGS: Brain: Mild atrophy. Negative for hydrocephalus. Mild
periventricular white matter hyperintensity.

Negative for acute infarct, hemorrhage, mass.

Vascular: Normal arterial flow voids.

Skull and upper cervical spine: Negative

Sinuses/Orbits: Paranasal sinuses clear.  Negative orbit

Other: None
IMPRESSION: No acute abnormality.  Mild generalized atrophy.

## 2022-05-22 ENCOUNTER — Encounter (HOSPITAL_COMMUNITY): Payer: Medicare Other | Admitting: Physical Therapy

## 2022-05-24 ENCOUNTER — Ambulatory Visit (HOSPITAL_COMMUNITY): Payer: Medicare Other | Admitting: Physical Therapy

## 2022-05-24 DIAGNOSIS — R262 Difficulty in walking, not elsewhere classified: Secondary | ICD-10-CM

## 2022-05-24 DIAGNOSIS — M545 Low back pain, unspecified: Secondary | ICD-10-CM

## 2022-05-24 DIAGNOSIS — R2689 Other abnormalities of gait and mobility: Secondary | ICD-10-CM

## 2022-05-24 DIAGNOSIS — M6281 Muscle weakness (generalized): Secondary | ICD-10-CM | POA: Diagnosis not present

## 2022-05-24 DIAGNOSIS — Z9181 History of falling: Secondary | ICD-10-CM

## 2022-05-24 NOTE — Therapy (Addendum)
OUTPATIENT PHYSICAL THERAPY TREATMENT NOTE   Patient Name: Katherine Middleton MRN: [id number] DOB:22-Oct-1951, 71 y.o., female Today's Date: 05/24/2022  PCP: Aletha Halim., PA-C REFERRING PROVIDER: Aletha Halim., PA-C     END OF SESSION:   PT End of Session - 05/24/22 1625     Visit Number 16    Number of Visits 18    Date for PT Re-Evaluation 05/26/22    Authorization Type UHC updated on 04/26/22 is actually [facility] Medicare and thus follows medicare guidlines    Authorization Time Period No auth required, no visit limit    Authorization - Visit Number 15    Authorization - Number of Visits 18    Progress Note Due on Visit 20    PT Start Time 1416    PT Stop Time 1445    PT Time Calculation (min) 29 min    Activity Tolerance Patient tolerated treatment well;Patient limited by fatigue    Behavior During Therapy [facility] for tasks assessed/performed                 Past Medical History:  Diagnosis Date   Anxiety    Arthritis    Bipolar disorder ([place])    Depression    Fibromyalgia    GERD (gastroesophageal reflux disease) 12/31/2002   HLD (hyperlipidemia)    diet controlled - no meds   HSV infection    Liver mass 07/2021   Osteoarthritis    Prolonged Q-T interval on ECG 02/2021   Sinusitis 12/31/2012   Wears glasses    Past Surgical History:  Procedure Laterality Date   BIOPSY  08/24/2021   Procedure: BIOPSY;  Surgeon: Irving Copas., MD;  Location: [place];  Service: Gastroenterology;;   BIOPSY  09/11/2021   Procedure: BIOPSY;  Surgeon: Irving Copas., MD;  [facility];  Service: Gastroenterology;;   ESOPHAGOGASTRODUODENOSCOPY (EGD) WITH PROPOFOL N/A 08/11/2021   Procedure: ESOPHAGOGASTRODUODENOSCOPY (EGD) WITH PROPOFOL;  Surgeon: Carol Ada, MD;  [facility];  Service: Endoscopy;  Laterality: N/A;   ESOPHAGOGASTRODUODENOSCOPY (EGD) WITH PROPOFOL N/A 08/24/2021   Procedure: ESOPHAGOGASTRODUODENOSCOPY (EGD) WITH  PROPOFOL;  Surgeon: Rush Landmark Telford Nab., MD;  Location: [place];  Service: Gastroenterology;  Laterality: N/A;   ESOPHAGOGASTRODUODENOSCOPY (EGD) WITH PROPOFOL N/A 09/11/2021   Procedure: ESOPHAGOGASTRODUODENOSCOPY (EGD) WITH PROPOFOL;  Surgeon: Rush Landmark Telford Nab., MD;  [facility];  Service: Gastroenterology;  Laterality: N/A;   EUS N/A 08/24/2021   Procedure: UPPER ENDOSCOPIC ULTRASOUND (EUS) LINEAR;  Surgeon: Irving Copas., MD;  Location: [place];  Service: Gastroenterology;  Laterality: N/A;   EUS N/A 09/11/2021   Procedure: UPPER ENDOSCOPIC ULTRASOUND (EUS) LINEAR;  Surgeon: Irving Copas., MD;  [facility];  Service: Gastroenterology;  Laterality: N/A;   FINE NEEDLE ASPIRATION  08/24/2021   Procedure: FINE NEEDLE ASPIRATION;  Surgeon: Rush Landmark Telford Nab., MD;  Location: [place];  Service: Gastroenterology;;   FINE NEEDLE ASPIRATION  09/11/2021   Procedure: FINE NEEDLE ASPIRATION (FNA) LINEAR;  Surgeon: Irving Copas., MD;  [facility];  Service: Gastroenterology;;  Delaine Lame. not in system to charge   TONSILLECTOMY  04/08/57   some date around then   TUBAL LIGATION  10/21/1984   UPPER ESOPHAGEAL ENDOSCOPIC ULTRASOUND (EUS) N/A 08/11/2021   Procedure: UPPER ESOPHAGEAL ENDOSCOPIC ULTRASOUND (EUS);  Surgeon: Carol Ada, MD;  [facility];  Service: Endoscopy;  Laterality: N/A;   Patient Active Problem List   Diagnosis Date Noted   Back pain, chronic 04/03/2013  Anxiety 12/17/2012   Vitamin D insufficiency 12/17/2012   GERD (gastroesophageal reflux disease) 11/18/2012   Bipolar 1 disorder ([place]) 11/18/2012   Fibromyalgia 11/18/2012    REFERRING DIAG: weakness of both LE and UE weakness  THERAPY DIAG:  No diagnosis found.  PERTINENT HISTORY: arthritis, fibromyalgia, anxiety, chronic LBP  PRECAUTIONS: fall  SUBJECTIVE: Pt states she is to return back to [place] in 6 weeks with no changes.  No complaints but still having the pain in her back. PAIN:   Are you having pain? Yes: NPRS scale: 4-5/10 Pain location: back and neck Pain description: tight, aching, sharp Aggravating factors: turning head Relieving factors: rest, Tylenol     OBJECTIVE:   LE MMT:   MMT Right 03/21/2022 Left 03/21/2022 Right 04/26/22 Left 04/26/22  Hip flexion '5 5 5 5  ' Hip extension 4 4 4+ 4+  Hip abduction 4+ 4+ 4+ 4+  Hip adduction        Hip internal rotation        Hip external rotation        Knee flexion        Knee extension '5 5 5 5  ' Ankle dorsiflexion '5 5 5 5  ' Ankle plantarflexion        Ankle inversion        Ankle eversion         (Blank rows = not tested)       FUNCTIONAL TESTS:  5 times sit to stand: At eval = 18.8 seconds with use of hands on thighs  04/26/22 - 17.5 seconds with no use of hands 2 minute walk test: At eval = 226 feet using quad cane, decreased stride and noted foot drop on LLE   04/26/22 - 375 feet using quad cane, equal stride length, no foot drop observed  Balance: At eval = tandem 4 sec RT, 7 sec LT  04/26/22 - B tandem Right and Left both for 22 seconds with no hand assist Single leg stance: At eval = unable to maintain without UE assist     04/26/22 - 10 seconds no hand assist on left leg; 5 seconds no hand assist on right leg      TODAY'S TREATMENT:  05/24/22 Tandem stance static no UE 2 x  30" each (max10" Rt LE lead, full 30" with Lt leading without UE) Heel and toe raises 2 x 10 each on incline 4" mini lunge without UE assist x 10 each leg Foam march x 10 with intermittent HHA, min guard assist from therapist Foam stand with small BOS with bilat UE flexion Sit to stand x 10 no UE assist with feet on foam Vectors 5X5" with 1 HHA   05/17/22  Nu step lv 2 (Seat 7) 7 min  LE only with moist heat on neck  Tandem stance static no UE 2 x  30" each Heel and toe raises 2 x 10 each 6" mini lunge without UE assist x 10 each leg Foam march x 30" Foam  stand with small BOS with bilat UE flexion Tandem walking in // bars x 3 passes Sit to stand x 10 no UE assist 5 x sit to stand in 22 sec today Sit to stand on foam x 10 no UE assist   05/15/22 Nu step lv 2 (Seat 7) 6 min  LE only with moist heat on neck Tandem stance with head turns 30" each Tandem stance static no UE 30" each Heel and toe raises 15X each Stairs 7  inch 5 RT reciprocal single rail  Weighted walkouts 2 plates x 5 each  Tandem gait 4RT in parallel bars Gait with head turns 3RT  05/08/22 Nu step lv 2 (Seat 7) 6 min   Stairs 7 inch 5 RT reciprocal single rail  Weighted walkouts 2 plates x 5 each  Tandem stance with head turns x20 Tandem gait 3RT Gait with head turns 3RT   05/03/22: (Moist heat to neck during first 10 min of treatment, non billed)  Heel raise 2 x 10 Toe raise 2 x10 Vector stance 5x 5" 1 HHA 5STS x 2 19" then 16.52" Step up 7in 2 then 1 HHA 10x 2 Squat 10x Toe tapping 7in toe tapping 2x 8, able to complete 8 alternating in 12.17"  Tandem stance with head turns x10   PATIENT EDUCATION:  Education details: Exercise form and function 04/26/22: re-assessment findings, baseline seated activity for post op safety as well Person educated: Patient Education method: Explanation and Demonstration Education comprehension: verbalized understanding and returned demonstration     HOME EXERCISE PROGRAM: Access Code: [email or web address] URL: [email or web address] Date: 03/21/2022 Prepared by: Josue Hector   Exercises Heel Raises with Counter Support - 3 x daily - 7 x weekly - 2 sets - 10 reps Toe Raises with Counter Support - 3 x daily - 7 x weekly - 2 sets - 10 reps Standing March with Counter Support - 3 x daily - 7 x weekly - 2 sets - 10 reps  Tandem stance 30x "     ASSESSMENT:   CLINICAL IMPRESSION: Pt was late for session today and received short session.  Pt able to complete all challenges today with only one short rest break.  Good  stability overall with addition of vector stance to help improve this.    Patient will continue to benefit from skilled therapy services to reduce remaining deficits and improve functional ability.    OBJECTIVE IMPAIRMENTS Abnormal gait, decreased activity tolerance, decreased balance, decreased endurance, decreased mobility, difficulty walking, decreased strength, improper body mechanics, and pain.    ACTIVITY LIMITATIONS cleaning, community activity, meal prep, laundry, yard work, shopping, and yard work.      GOALS:     SHORT TERM GOALS: Target date: 04/04/2022    Patient will be independent with initial HEP and self-management strategies to improve functional outcomes Baseline:  Goal status: MET!   LONG TERM GOALS: Target date: 05/02/22  Reviesed to 05/26/22   Patient will be independent with advanced HEP and self-management strategies to improve functional outcomes Baseline:  Goal status: ongoing   2. Patient will be able to perform stand x 5 in < 15 seconds to demonstrate improvement in functional mobility and reduced risk for falls.  REVISED to under 11 seconds  Goal status: ongoing   3.  Patient will report reduction of back pain to <3/10 at rest for improved quality of life and ability to perform ADLs  Baseline:  Goal status: ongoing   4. Patient will be able to maintain tandem stance >30 seconds on BLEs to improve stability and reduce risk for falls Baseline:  Goal status: ongoing   5. Patient will be able to ambulate at least 325 feet during 2MWT with LRAD to demonstrate improved ability to perform functional mobility and associated tasks. Baseline:  Goal status: MET!   PLAN: PT FREQUENCY: additional 2x/week   PT DURATION: additional 4 weeks   PLANNED INTERVENTIONS: Therapeutic exercises, Therapeutic activity, Neuromuscular re-education, Balance training, Gait training, Patient/Family education, Joint manipulation,  Joint mobilization, Stair training, Aquatic  Therapy, Dry Needling, Electrical stimulation, Spinal manipulation, Spinal mobilization, Cryotherapy, Moist heat, scar mobilization, Taping, Traction, Ultrasound, Biofeedback, Ionotophoresis 53m/ml Dexamethasone, and Manual therapy.     PLAN FOR NEXT SESSION:  Progress functional LE strength, balance, gait and stairs.   4:26 PM, 05/24/22 ATeena Irani PTA/CLT CPine BendPh: [phone]

## 2022-05-29 ENCOUNTER — Ambulatory Visit (HOSPITAL_COMMUNITY): Payer: Medicare Other | Attending: Family Medicine | Admitting: Physical Therapy

## 2022-05-29 DIAGNOSIS — R269 Unspecified abnormalities of gait and mobility: Secondary | ICD-10-CM | POA: Diagnosis not present

## 2022-05-29 DIAGNOSIS — M1990 Unspecified osteoarthritis, unspecified site: Secondary | ICD-10-CM | POA: Insufficient documentation

## 2022-05-29 DIAGNOSIS — Z9181 History of falling: Secondary | ICD-10-CM | POA: Diagnosis present

## 2022-05-29 DIAGNOSIS — M797 Fibromyalgia: Secondary | ICD-10-CM | POA: Insufficient documentation

## 2022-05-29 DIAGNOSIS — R262 Difficulty in walking, not elsewhere classified: Secondary | ICD-10-CM | POA: Insufficient documentation

## 2022-05-29 DIAGNOSIS — M6281 Muscle weakness (generalized): Secondary | ICD-10-CM | POA: Diagnosis not present

## 2022-05-29 DIAGNOSIS — R2689 Other abnormalities of gait and mobility: Secondary | ICD-10-CM

## 2022-05-29 NOTE — Therapy (Signed)
OUTPATIENT PHYSICAL THERAPY TREATMENT NOTE   Patient Name: Katherine Middleton MRN: [id number] DOB:Sep 25, 1951, 71 y.o., female Today's Date: 05/29/2022  PCP: Aletha Halim., PA-C REFERRING PROVIDER: Aletha Halim., PA-C  PHYSICAL THERAPY DISCHARGE SUMMARY  Visits from Start of Care: 17  Current functional level related to goals / functional outcomes: See below   Remaining deficits: See below   Education / Equipment: HEP   Patient agrees to discharge. Patient goals were partially met. Patient is being discharged due to maximized rehab potential.     END OF SESSION:   PT End of Session - 05/29/22 1316     Visit Number 17    Number of Visits 18    Date for PT Re-Evaluation 05/26/22    Authorization Type [employer] updated on 04/26/22 is actually [facility] Medicare and thus follows medicare guidlines    Authorization Time Period No auth required, no visit limit    Authorization - Visit Number 15    Authorization - Number of Visits 18    Progress Note Due on Visit 20    PT Start Time 1315    PT Stop Time 1400    PT Time Calculation (min) 45 min    Activity Tolerance Patient tolerated treatment well;Patient limited by fatigue    Behavior During Therapy [facility] for tasks assessed/performed                 Past Medical History:  Diagnosis Date   Anxiety    Arthritis    Bipolar disorder ([place])    Depression    Fibromyalgia    GERD (gastroesophageal reflux disease) 12/31/2002   HLD (hyperlipidemia)    diet controlled - no meds   HSV infection    Liver mass 07/2021   Osteoarthritis    Prolonged Q-T interval on ECG 02/2021   Sinusitis 12/31/2012   Wears glasses    Past Surgical History:  Procedure Laterality Date   BIOPSY  08/24/2021   Procedure: BIOPSY;  Surgeon: Irving Copas., MD;  Location: [place];  Service: Gastroenterology;;   BIOPSY  09/11/2021   Procedure: BIOPSY;  Surgeon: Irving Copas., MD;  Location: [facility];  Service:  Gastroenterology;;   ESOPHAGOGASTRODUODENOSCOPY (EGD) WITH PROPOFOL N/A 08/11/2021   Procedure: ESOPHAGOGASTRODUODENOSCOPY (EGD) WITH PROPOFOL;  Surgeon: Carol Ada, MD;  Location: [facility];  Service: Endoscopy;  Laterality: N/A;   ESOPHAGOGASTRODUODENOSCOPY (EGD) WITH PROPOFOL N/A 08/24/2021   Procedure: ESOPHAGOGASTRODUODENOSCOPY (EGD) WITH PROPOFOL;  Surgeon: Rush Landmark Telford Nab., MD;  Location: [place];  Service: Gastroenterology;  Laterality: N/A;   ESOPHAGOGASTRODUODENOSCOPY (EGD) WITH PROPOFOL N/A 09/11/2021   Procedure: ESOPHAGOGASTRODUODENOSCOPY (EGD) WITH PROPOFOL;  Surgeon: Rush Landmark Telford Nab., MD;  Location: [facility];  Service: Gastroenterology;  Laterality: N/A;   EUS N/A 08/24/2021   Procedure: UPPER ENDOSCOPIC ULTRASOUND (EUS) LINEAR;  Surgeon: Irving Copas., MD;  Location: [place];  Service: Gastroenterology;  Laterality: N/A;   EUS N/A 09/11/2021   Procedure: UPPER ENDOSCOPIC ULTRASOUND (EUS) LINEAR;  Surgeon: Irving Copas., MD;  Location: [facility];  Service: Gastroenterology;  Laterality: N/A;   FINE NEEDLE ASPIRATION  08/24/2021   Procedure: FINE NEEDLE ASPIRATION;  Surgeon: Rush Landmark Telford Nab., MD;  Location: [place];  Service: Gastroenterology;;   FINE NEEDLE ASPIRATION  09/11/2021   Procedure: FINE NEEDLE ASPIRATION (FNA) LINEAR;  Surgeon: Irving Copas., MD;  Location: [facility];  Service: Gastroenterology;;  Delaine Lame. not in system to charge   TONSILLECTOMY  04/08/57   some date around then  TUBAL LIGATION  10/21/1984   UPPER ESOPHAGEAL ENDOSCOPIC ULTRASOUND (EUS) N/A 08/11/2021   Procedure: UPPER ESOPHAGEAL ENDOSCOPIC ULTRASOUND (EUS);  Surgeon: Carol Ada, MD;  Location: [facility];  Service: Endoscopy;  Laterality: N/A;   Patient Active Problem List   Diagnosis Date Noted   Back pain, chronic 04/03/2013   Anxiety 12/17/2012   Vitamin D insufficiency 12/17/2012   GERD (gastroesophageal reflux  disease) 11/18/2012   Bipolar 1 disorder ([place]) 11/18/2012   Fibromyalgia 11/18/2012    REFERRING DIAG: weakness of both LE and UE weakness  THERAPY DIAG:  History of falling  Muscle weakness (generalized)  Other abnormalities of gait and mobility  Difficulty in walking, not elsewhere classified  PERTINENT HISTORY: arthritis, fibromyalgia, anxiety, chronic LBP  PRECAUTIONS: fall  SUBJECTIVE:  PT states that she is doing her exercises and has no questions.  She is completing the exercises about three times a week.                          Pt states that she can not remember the last time that she fell.   PAIN:   Are you having pain? No more achy today than pain  Pain location: back and neck Pain description: tight, aching, sharp Aggravating factors: turning head Relieving factors: rest, Tylenol     OBJECTIVE:   LE MMT:   MMT Right 03/21/2022 Left 03/21/2022 Right 04/26/22 Left 04/26/22 Left 05/29/22  Right  05/29/22  Hip flexion _0 Hip extension 4 4 4+ 4+ 4 4  Hip abduction 4+ 4+ 4+ 4+ 5 5  Hip adduction          Hip internal rotation          Hip external rotation          Knee flexion          Knee extension _1 Ankle dorsiflexion _2 Ankle plantarflexion          Ankle inversion          Ankle eversion           (Blank rows = not tested)       FUNCTIONAL TESTS:  5 times sit to stand: At eval = 18.8 seconds with use of hands on thighs  04/26/22 - 17.5 seconds with no use of hands  05/29/22-14 seconds was 17.5  2 minute walk test: At eval = 226 feet using quad cane, decreased stride and noted foot drop on LLE   04/26/22 - 375 feet using quad cane, equal stride length, no foot drop observed              05/29/22  242 using quad cane.  Balance: At eval = tandem 4 sec RT, 7 sec LT  04/26/22 - B tandem Right and Left both for 22 seconds with no hand assist             05/29/22 10 " Single leg stance: At eval = unable to maintain without  UE assist     04/26/22 - 10 seconds no hand assist on left leg; 5 seconds no hand assist on right leg                                      05/29/22- Rt 22"; Lt  14   TODAY'S TREATMENT:             05/29/22             SLS x 3             2 minute walk test             Sit to stand x 5             Prone hip extension x 10             Bridge x 10    05/24/22 Tandem stance static no UE 2 x  30" each (max10" Rt LE lead, full 30" with Lt leading without UE) Heel and toe raises 2 x 10 each on incline 4" mini lunge without UE assist x 10 each leg Foam march x 10 with intermittent HHA, min guard assist from therapist Foam stand with small BOS with bilat UE flexion Sit to stand x 10 no UE assist with feet on foam Vectors 5X5" with 1 HHA   05/17/22  Nu step lv 2 (Seat 7) 7 min  LE only with moist heat on neck  Tandem stance static no UE 2 x  30" each Heel and toe raises 2 x 10 each 6" mini lunge without UE assist x 10 each leg Foam march x 30" Foam stand with small BOS with bilat UE flexion Tandem walking in // bars x 3 passes Sit to stand x 10 no UE assist 5 x sit to stand in 22 sec today Sit to stand on foam x 10 no UE assist   05/15/22 Nu step lv 2 (Seat 7) 6 min  LE only with moist heat on neck Tandem stance with head turns 30" each Tandem stance static no UE 30" each Heel and toe raises 15X each Stairs 7 inch 5 RT reciprocal single rail  Weighted walkouts 2 plates x 5 each  Tandem gait 4RT in parallel bars Gait with head turns 3RT  05/08/22 Nu step lv 2 (Seat 7) 6 min   Stairs 7 inch 5 RT reciprocal single rail  Weighted walkouts 2 plates x 5 each  Tandem stance with head turns x20 Tandem gait 3RT Gait with head turns 3RT   05/03/22: (Moist heat to neck during first 10 min of treatment, non billed)  Heel raise 2 x 10 Toe raise 2 x10 Vector stance 5x 5" 1 HHA 5STS x 2 19" then 16.52" Step up 7in 2 then 1 HHA 10x 2 Squat 10x Toe tapping 7in toe tapping 2x 8, able to  complete 8 alternating in 12.17"  Tandem stance with head turns x10   PATIENT EDUCATION:  Education details: Exercise form and function 04/26/22: re-assessment findings, baseline seated activity for post op safety as well Person educated: Patient Education method: Explanation and Demonstration Education comprehension: verbalized understanding and returned demonstration     HOME EXERCISE PROGRAM:               Bridge, sit to stand, prone hip extension and walking  Access Code: [email or web address] URL: [email or web address] Date: 03/21/2022 Prepared by: Josue Hector   Exercises Heel Raises with Counter Support - 3 x daily - 7 x weekly - 2 sets - 10 reps Toe Raises with Counter Support - 3 x daily - 7 x weekly - 2 sets - 10 reps Standing March with Counter Support - 3 x daily - 7 x weekly - 2 sets - 10 reps  Tandem  stance 30x "     ASSESSMENT:   CLINICAL IMPRESSION:   Pt reassessed.  She has improved in her sit to stand time and single leg stance as well as hip abductor strength.  Hip extensor, tandem stance and 2 minute walk test have decreased.  At this time pt is I in her HEP and therapist feels that skilled therapy is no longer needed  OBJECTIVE IMPAIRMENTS Abnormal gait, decreased activity tolerance, decreased balance, decreased endurance, decreased mobility, difficulty walking, decreased strength, improper body mechanics, and pain.    ACTIVITY LIMITATIONS cleaning, community activity, meal prep, laundry, yard work, shopping, and yard work.      GOALS:     SHORT TERM GOALS: Target date: 04/04/2022    Patient will be independent with initial HEP and self-management strategies to improve functional outcomes Baseline:  Goal status: MET!   LONG TERM GOALS: Target date: 05/02/22  Reviesed to 05/26/22   Patient will be independent with advanced HEP and self-management strategies to improve functional outcomes Baseline:  Goal status: MEt    2. Patient will be able to  perform stand x 5 in < 15 seconds to demonstrate improvement in functional mobility and reduced risk for falls.  REVISED to under 11 seconds  Goal status: ongoing   3.  Patient will report reduction of back pain to <3/10 at rest for improved quality of life and ability to perform ADLs  Baseline:  Goal status: ongoing   4. Patient will be able to maintain tandem stance >30 seconds on BLEs to improve stability and reduce risk for falls Baseline:  Goal status: ongoing   5. Patient will be able to ambulate at least 325 feet during 2MWT with LRAD to demonstrate improved ability to perform functional mobility and associated tasks. Baseline:  Goal status: MET!   PLAN: PT FREQUENCY: additional 2x/week   PT DURATION: additional 4 weeks   PLANNED INTERVENTIONS: Therapeutic exercises, Therapeutic activity, Neuromuscular re-education, Balance training, Gait training, Patient/Family education, Joint manipulation, Joint mobilization, Stair training, Aquatic Therapy, Dry Needling, Electrical stimulation, Spinal manipulation, Spinal mobilization, Cryotherapy, Moist heat, scar mobilization, Taping, Traction, Ultrasound, Biofeedback, Ionotophoresis 61m/ml Dexamethasone, and Manual therapy.     PLAN FOR NEXT SESSION:  Discharge 1:55 PM, 05/29/22 CRayetta Humphrey PWaverlyCLT [phone]

## 2022-05-31 ENCOUNTER — Encounter (HOSPITAL_COMMUNITY): Payer: Medicare Other | Admitting: Physical Therapy

## 2022-09-14 IMAGING — CT CT CHEST-ABD-PELV W/O CM
2 of 4 series · 13 of 36 positions shown, 15 images · non-contrast
Comparison: None.

CLINICAL DATA: Unintentional weight loss.

EXAM:
CT CHEST, ABDOMEN AND PELVIS WITHOUT CONTRAST
TECHNIQUE: Multidetector CT imaging of the chest, abdomen and pelvis was
performed following the standard protocol without IV contrast.

[Series 2: cap without · axial · non-contrast · 0.71mm/px · z∈[+906,+1391]mm · 10 of 119 slices shown, 12 images]
[im 11/119  mediastinal]
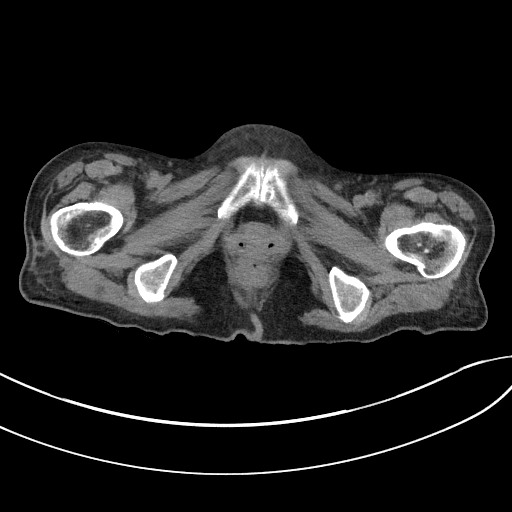
[im 11/119  bone]
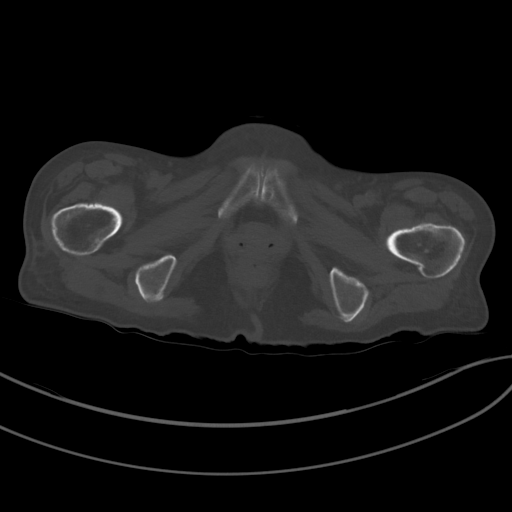
[im 22/119  mediastinal]
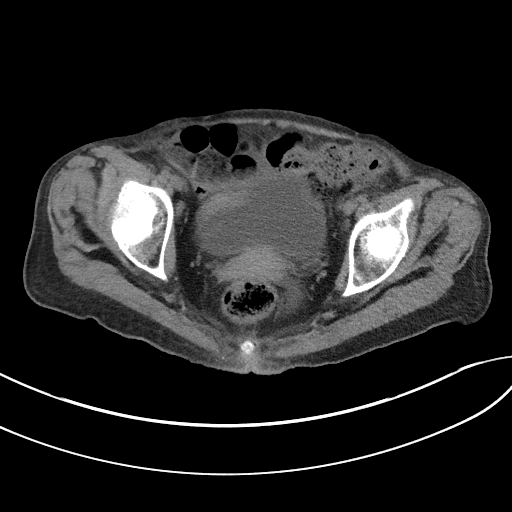
[im 33/119  mediastinal]
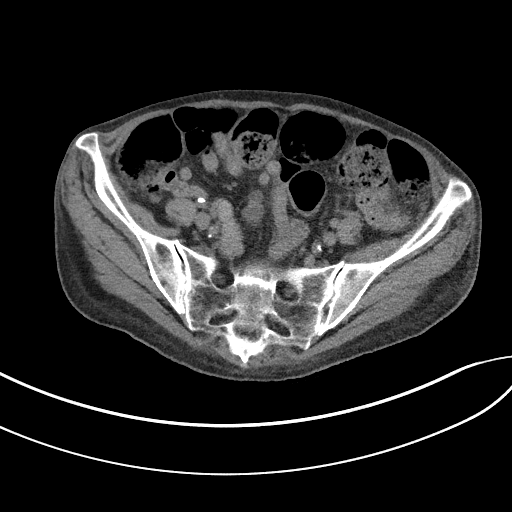
[im 43/119  mediastinal]
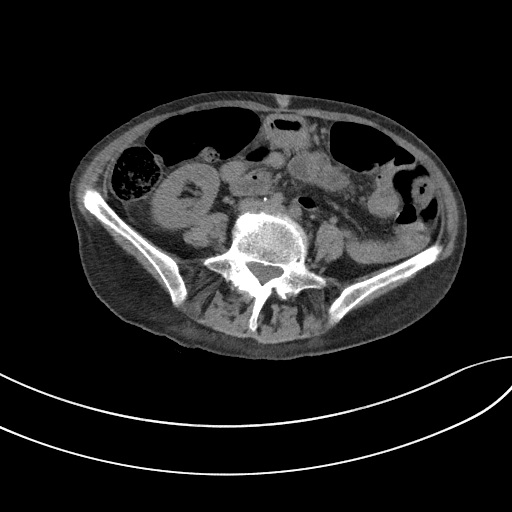
[im 54/119  mediastinal]
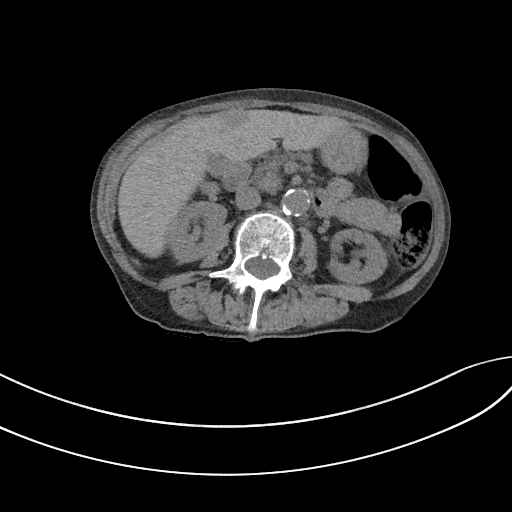
[im 65/119  mediastinal]
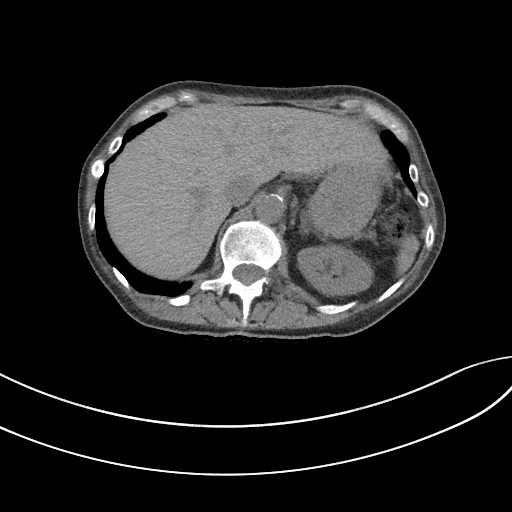
[im 76/119  mediastinal]
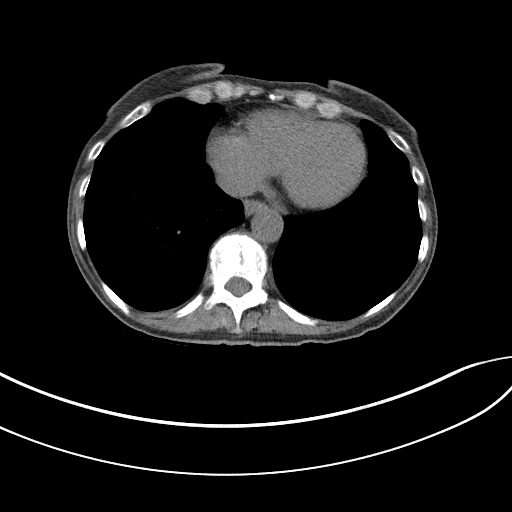
[im 86/119  mediastinal]
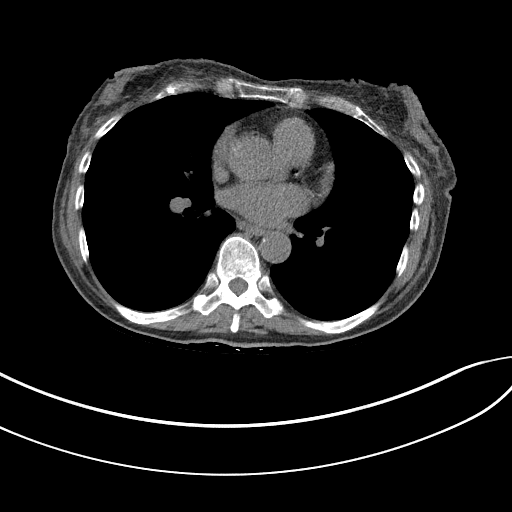
[im 97/119  mediastinal]
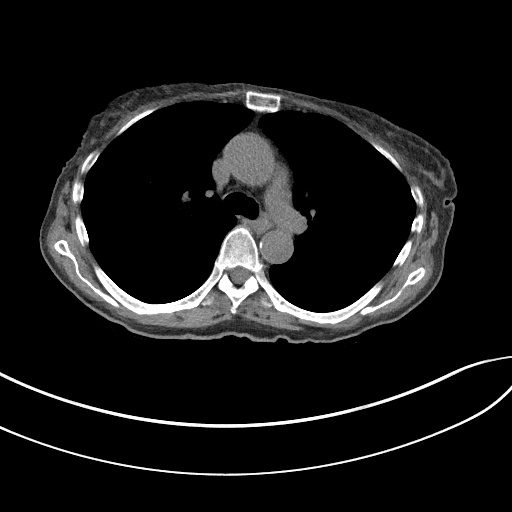
[im 97/119  bone]
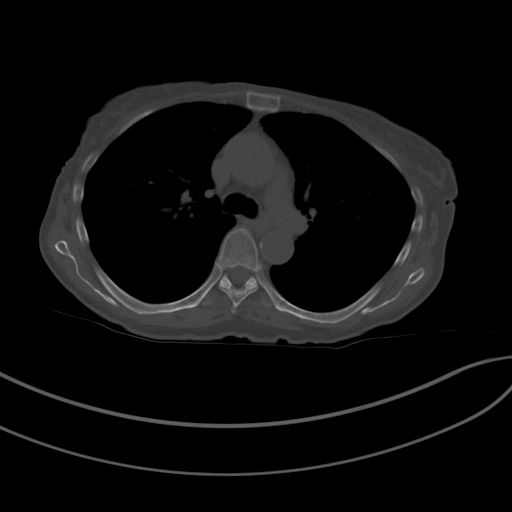
[im 108/119  mediastinal]
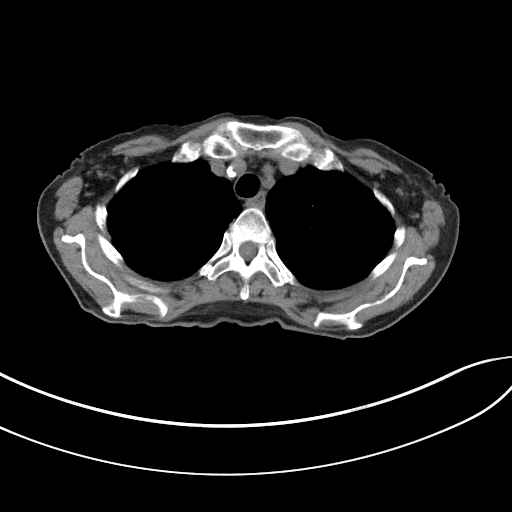

[Series 5: coronal · coronal · 0.64mm/px · 3 of 141 slices shown]
[im 29/141  mediastinal]
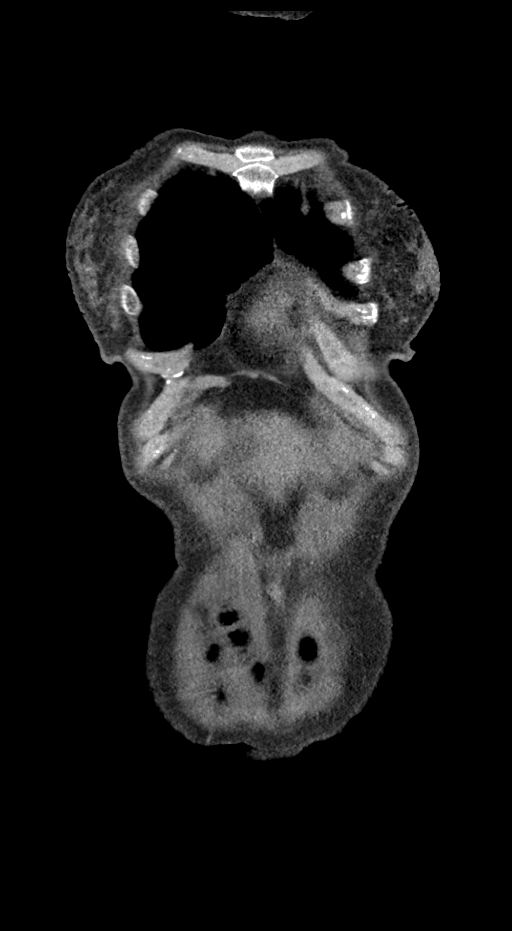
[im 57/141  mediastinal]
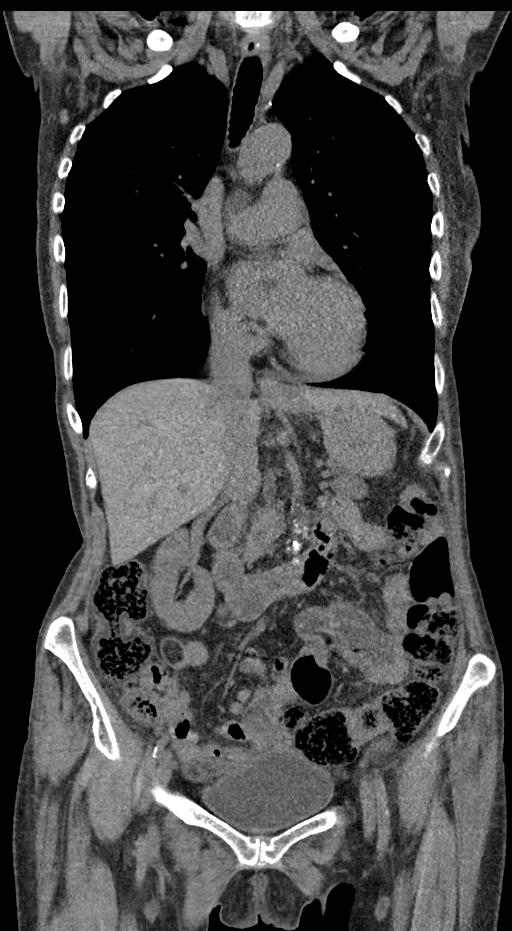
[im 85/141  mediastinal]
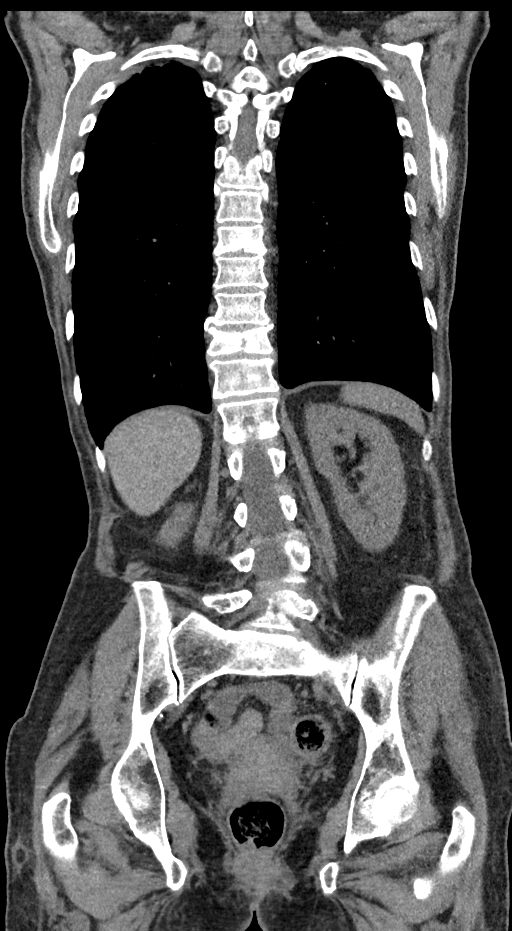

[13 of 36 positions shown; findings below may reference images not displayed]

FINDINGS: CT CHEST FINDINGS

Cardiovascular: Atherosclerosis of thoracic aorta is noted without
aneurysm formation. Normal cardiac size. No pericardial effusion.

Mediastinum/Nodes: No enlarged mediastinal, hilar, or axillary lymph
nodes. Thyroid gland, trachea, and esophagus demonstrate no
significant findings.

Lungs/Pleura: No pneumothorax or pleural effusion is noted. Minimal
biapical scarring is noted. Pneumatocele or bulla is noted in right
upper lobe. No definite acute abnormality is noted.

Musculoskeletal: No chest wall mass or suspicious bone lesions
identified.

CT ABDOMEN PELVIS FINDINGS

Hepatobiliary: No gallstones or biliary dilatation is noted. 2.1 x
1.5 cm low density is noted in the left hepatic lobe of uncertain
etiology.

Pancreas: Unremarkable. No pancreatic ductal dilatation or
surrounding inflammatory changes.

Spleen: Calcified splenic granulomata are noted.

Adrenals/Urinary Tract: Adrenal glands are unremarkable. Kidneys are
normal, without renal calculi, focal lesion, or hydronephrosis.
Bladder is unremarkable.

Stomach/Bowel: The stomach appears normal. There is no evidence of
bowel obstruction or inflammation. Stool is noted throughout the
colon.

Vascular/Lymphatic: Aortic atherosclerosis. No enlarged abdominal or
pelvic lymph nodes.

Reproductive: Uterus and bilateral adnexa are unremarkable.

Other: No abdominal wall hernia or abnormality. No abdominopelvic
ascites.

Musculoskeletal: No acute or significant osseous findings.
IMPRESSION: 2.1 x 1.5 cm density is noted anteriorly in the left hepatic lobe.
MRI with and without gadolinium is recommended to evaluate for
possible neoplasm.

No other significant abnormality is noted in the, chest abdomen or
pelvis.

Aortic Atherosclerosis ([id number]).

## 2022-10-24 ENCOUNTER — Telehealth (INDEPENDENT_AMBULATORY_CARE_PROVIDER_SITE_OTHER): Payer: Medicare Other | Admitting: Psychiatry

## 2022-10-24 ENCOUNTER — Encounter (HOSPITAL_COMMUNITY): Payer: Self-pay | Admitting: Psychiatry

## 2022-10-24 DIAGNOSIS — F319 Bipolar disorder, unspecified: Secondary | ICD-10-CM

## 2022-10-24 MED ORDER — GABAPENTIN 300 MG PO CAPS: 600.0000 mg | ORAL_CAPSULE | Freq: Two times a day (BID) | ORAL | 2 refills | Status: DC

## 2022-10-24 MED ORDER — ALPRAZOLAM 0.5 MG PO TABS: 0.5000 mg | ORAL_TABLET | Freq: Four times a day (QID) | ORAL | 1 refills | Status: DC | PRN

## 2022-10-24 MED ORDER — PAROXETINE HCL 40 MG PO TABS: 40.0000 mg | ORAL_TABLET | ORAL | 2 refills | Status: DC

## 2022-10-24 MED ORDER — TRAZODONE HCL 150 MG PO TABS: 150.0000 mg | ORAL_TABLET | Freq: Every day | ORAL | 2 refills | Status: DC

## 2022-10-24 NOTE — Progress Notes (Signed)
Virtual Visit via Telephone Note  I connected with Katherine Middleton on 10/24/22 at  2:40 PM EDT by telephone and verified that I am speaking with the correct person using two identifiers.  Location: Patient: home Provider: office   I discussed the limitations, risks, security and privacy concerns of performing an evaluation and management service by telephone and the availability of in person appointments. I also discussed with the patient that there may be a patient responsible charge related to this service. The patient expressed understanding and agreed to proceed.     I discussed the assessment and treatment plan with the patient. The patient was provided an opportunity to ask questions and all were answered. The patient agreed with the plan and demonstrated an understanding of the instructions.   The patient was advised to call back or seek an in-person evaluation if the symptoms worsen or if the condition fails to improve as anticipated.  I provided 15 minutes of non-face-to-face time during this encounter.   Katherine Spiller, MD  [facility] MD/PA/NP OP Progress Note  10/24/2022 2:54 PM [name]  MRN:  [id number]  Chief Complaint:  Chief Complaint  Patient presents with   Anxiety   Depression   Follow-up   HPI: Patient is 71 year-old widowed Caucasian female who lives alone in [place]. . She is on disability for fibromyalgia arthritis and bipolar disorder.   The patient has been seeing a psychiatrist for years. Initially she was diagnosed with depression but eventually she developed some manic symptoms and now is designated as having bipolar disorder. She's often very tired due to her fibromyalgia and has chronic muscle aches and pains. She feels that the medicines she is on now been very helpful. She sleeping well and her mood is stable. She's not using drugs or alcohol denies suicidal ideation. She's never been psychotic but mentions having  "breakdown" about 10 years ago.  She's never been hospitalized  The patient returns for follow-up after 3 months.  She is doing about the same.  She completed her course of physical therapy.  She is still not gaining weight and has gone back down to 97 pounds.  She is still being followed closely by GI and general surgery for her pancreatic duct dilatation.  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  She claims that she is eating several meals a day along with protein shakes.  She states her mood has been good and she denies significant depression anxiety mood swings or difficulty sleeping.  Her anxiety is under good control. Visit Diagnosis:    ICD-10-CM   1. Bipolar 1 disorder (HCC)  F31.9 PARoxetine (PAXIL) 40 MG tablet    traZODone (DESYREL) 150 MG tablet      Past Psychiatric History: Long-term outpatient treatment  Past Medical History:  Past Medical History:  Diagnosis Date   Anxiety    Arthritis    Bipolar disorder ([place])    Depression    Fibromyalgia    GERD (gastroesophageal reflux disease) 12/31/2002   HLD (hyperlipidemia)    diet controlled - no meds   HSV infection    Liver mass 07/2021   Osteoarthritis    Prolonged Q-T interval on ECG 02/2021   Sinusitis 12/31/2012   Wears glasses     Past Surgical History:  Procedure Laterality Date   BIOPSY  08/24/2021   Procedure: BIOPSY;  Surgeon: Irving Copas., MD;  Location: [place];  Service: Gastroenterology;;  BIOPSY  09/11/2021   Procedure: BIOPSY;  Surgeon: Irving Copas., MD;  Location: [facility];  Service: Gastroenterology;;   ESOPHAGOGASTRODUODENOSCOPY (EGD) WITH PROPOFOL N/A 08/11/2021   Procedure: ESOPHAGOGASTRODUODENOSCOPY (EGD) WITH PROPOFOL;  Surgeon: Carol Ada, MD;  Location: [facility];  Service: Endoscopy;  Laterality: N/A;   ESOPHAGOGASTRODUODENOSCOPY (EGD) WITH PROPOFOL N/A 08/24/2021   Procedure: ESOPHAGOGASTRODUODENOSCOPY (EGD) WITH PROPOFOL;  Surgeon: Rush Landmark  Telford Nab., MD;  Location: [place];  Service: Gastroenterology;  Laterality: N/A;   ESOPHAGOGASTRODUODENOSCOPY (EGD) WITH PROPOFOL N/A 09/11/2021   Procedure: ESOPHAGOGASTRODUODENOSCOPY (EGD) WITH PROPOFOL;  Surgeon: Rush Landmark Telford Nab., MD;  Location: [facility];  Service: Gastroenterology;  Laterality: N/A;   EUS N/A 08/24/2021   Procedure: UPPER ENDOSCOPIC ULTRASOUND (EUS) LINEAR;  Surgeon: Irving Copas., MD;  Location: [place];  Service: Gastroenterology;  Laterality: N/A;   EUS N/A 09/11/2021   Procedure: UPPER ENDOSCOPIC ULTRASOUND (EUS) LINEAR;  Surgeon: Irving Copas., MD;  Location: [facility];  Service: Gastroenterology;  Laterality: N/A;   FINE NEEDLE ASPIRATION  08/24/2021   Procedure: FINE NEEDLE ASPIRATION;  Surgeon: Rush Landmark Telford Nab., MD;  Location: [place];  Service: Gastroenterology;;   FINE NEEDLE ASPIRATION  09/11/2021   Procedure: FINE NEEDLE ASPIRATION (FNA) LINEAR;  Surgeon: Irving Copas., MD;  Location: [facility];  Service: Gastroenterology;;  Delaine Lame. not in system to charge   TONSILLECTOMY  04/08/57   some date around then   TUBAL LIGATION  10/21/1984   UPPER ESOPHAGEAL ENDOSCOPIC ULTRASOUND (EUS) N/A 08/11/2021   Procedure: UPPER ESOPHAGEAL ENDOSCOPIC ULTRASOUND (EUS);  Surgeon: Carol Ada, MD;  Location: [facility];  Service: Endoscopy;  Laterality: N/A;    Family Psychiatric History: See below  Family History:  Family History  Problem Relation Age of Onset   Bipolar disorder Sister    Anxiety disorder Sister    Dementia Sister 41   Paranoid behavior Sister    Bipolar disorder Father    Schizophrenia Father    Alcohol abuse Maternal Uncle    Alcohol abuse Paternal Uncle    Anxiety disorder Mother    ADD / ADHD Other    ADD / ADHD Other    Healthy Other    OCD Neg Hx    Seizures Neg Hx    Sexual abuse Neg Hx    Physical abuse Neg Hx     Social History:  Social History   Socioeconomic  History   Marital status: Married    Spouse name: Not on file   Number of children: Not on file   Years of education: Not on file   Highest education level: Not on file  Occupational History   Not on file  Tobacco Use   Smoking status: Every Day    Packs/day: 0.50    Years: 30.00    Total pack years: 15.00    Types: Cigarettes   Smokeless tobacco: Never   Tobacco comments:    11-15 cigs a day as of 05/01/2013  Vaping Use   Vaping Use: Never used  Substance and Sexual Activity   Alcohol use: No   Drug use: No   Sexual activity: Yes    Partners: Male    Birth control/protection: Post-menopausal  Other Topics Concern   Not on file  Social History Narrative   Left handed    Lives alone    Social Determinants of Health   Financial Resource Strain: Not on file  Food Insecurity: Not on file  Transportation Needs: Not on file  Physical  Activity: Not on file  Stress: Not on file  Social Connections: Not on file    Allergies:  Allergies  Allergen Reactions   Amoxicillin-Pot Clavulanate Anaphylaxis and Swelling   Cephalosporins Anaphylaxis    Other reaction(s): Unknown   Codeine Itching   Cymbalta [Duloxetine Hcl] Other (See Comments)    GERD   Duloxetine     Other reaction(s): Other (See Comments), Unknown GERD    Oxycodone Itching and Other (See Comments)    "OUT THERE", climbing the walls   Penicillins Anaphylaxis and Swelling    Throat swelling Reaction: 8-10 years ago   Baclofen Other (See Comments)    Anxiety got much worse and possibly ppted manic episode Other reaction(s): Unknown   Povidone Iodine Itching and Other (See Comments)    Particularly if in a douche  Other reaction(s): Unknown   Flonase [Fluticasone]     Makes eye pressure to increase   Propoxyphene Itching    Anxiety/jittery    Sulfa Antibiotics Swelling   Sulfasalazine Swelling    Other reaction(s): Unknown   Tramadol     Other reaction(s): Other (See Comments), Unknown Patient states  that this makes her nervous    Amitriptyline Other (See Comments)    Caused bowels to empty quickly and couldn't sleep on it. Other reaction(s): Other (See Comments), Unknown Caused bowels to empty quickly and couldn't sleep on it. Caused bowels to empty quickly and couldn't sleep on it. Caused bowels to empty quickly and couldn't sleep on it.    Aspirin Hives and Rash   Latex Swelling and Other (See Comments)    If in the mouth, it swells   Nsaids Nausea Only and Other (See Comments)    Almost passing out, bad acid reflux Other reaction(s): Other (See Comments) other   Tolmetin Nausea Only    Other reaction(s): Other (See Comments), Unknown Almost passing out, bad acid reflux Almost passing out, bad acid reflux     Metabolic Disorder Labs: No results found for: "HGBA1C", "MPG" No results found for: "PROLACTIN" No results found for: "CHOL", "TRIG", "HDL", "CHOLHDL", "VLDL", "LDLCALC" No results found for: "TSH"  Therapeutic Level Labs: No results found for: "LITHIUM" No results found for: "VALPROATE" No results found for: "CBMZ"  Current Medications: Current Outpatient Medications  Medication Sig Dispense Refill   acetaminophen (TYLENOL) 650 MG CR tablet Take 650 mg by mouth daily as needed for pain.     ALPRAZolam (XANAX) 0.5 MG tablet Take 1 tablet (0.5 mg total) by mouth 4 (four) times daily as needed. 360 tablet 1   cetirizine (ZYRTEC) 10 MG tablet Take 10 mg by mouth at bedtime.     Cholecalciferol (VITAMIN D3) 125 MCG (5000 UT) TABS Take 5,000 Units by mouth daily.     CREON 24000-76000 units CPEP Take by mouth.     gabapentin (NEURONTIN) 300 MG capsule Take 2 capsules (600 mg total) by mouth 2 (two) times daily. 480 capsule 2   omeprazole (PRILOSEC) 20 MG capsule Take 20 mg by mouth daily.      Pancrelipase, Lip-Prot-Amyl, (CREON) 24000-76000 units CPEP Take 2 capsules with each meal, 1 after starting/1 when finished eating, and 1 capsule with snack after starting  to eat.     PARoxetine (PAXIL) 40 MG tablet Take 1 tablet (40 mg total) by mouth every morning. 90 tablet 2   Polyethyl Glyc-Propyl Glyc PF (SYSTANE ULTRA PF) 0.4-0.3 % SOLN Place 1 drop into both eyes daily as needed (Dry eye).  sodium chloride (OCEAN) 0.65 % SOLN nasal spray Place 1 spray into both nostrils as needed for congestion.     tiZANidine (ZANAFLEX) 4 MG tablet Take 4 mg by mouth every 8 (eight) hours as needed (muscle pain).     traZODone (DESYREL) 150 MG tablet Take 1 tablet (150 mg total) by mouth at bedtime. 90 tablet 2   vitamin B-12 (CYANOCOBALAMIN) 1000 MCG tablet Take 1,000 mcg by mouth daily.     No current facility-administered medications for this visit.     Musculoskeletal: Strength & Muscle Tone: na Gait & Station: na Patient leans: N/A  Psychiatric Specialty Exam: Review of Systems  Constitutional:  Positive for fatigue and unexpected weight change.  All other systems reviewed and are negative.   There were no vitals taken for this visit.There is no height or weight on file to calculate BMI.  General Appearance: NA  Eye Contact:  NA  Speech:  Clear and Coherent  Volume:  Normal  Mood:  Euthymic  Affect:  NA  Thought Process:  Goal Directed  Orientation:  Full (Time, Place, and Person)  Thought Content: WDL   Suicidal Thoughts:  No  Homicidal Thoughts:  No  Memory:  Immediate;   Good Recent;   Fair Remote;   NA  Judgement:  Good  Insight:  Fair  Psychomotor Activity:  Decreased  Concentration:  Concentration: Fair and Attention Span: Fair  Recall:  [place] of Knowledge: Good  Language: Good  Akathisia:  No  Handed:  Right  AIMS (if indicated): not done  Assets:  Communication Skills Desire for Improvement Resilience Social Support  ADL's:  Intact  Cognition: WNL  Sleep:  Good   Screenings: PHQ2-9    Flowsheet Row Video Visit from 04/19/2022 in [place] [facility] Video Visit from 01/24/2022 in  [place] [facility] Video Visit from 10/25/2021 in [place] Video Visit from 07/25/2021 in [place] [facility] Video Visit from 05/02/2021 in [place] [facility]  PHQ-2 Total Score 0 0 0 0 0      Flowsheet Row Video Visit from 04/19/2022 in [place] [facility] Video Visit from 01/24/2022 in [place] [facility] Video Visit from 10/25/2021 in [place] No Risk No Risk No Risk        Assessment and Plan:  This patient is a 71 year old female with a history of bipolar disorder 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  She is still not gaining weight even with an improved diet.  All of this is suspicious but is being closely followed by GI and surgery.  For now she states her mood is stable so she will continue Paxil 40 mg daily for depression, gabapentin 600 mg twice daily for anxiety and fibromyalgia, Xanax 0.5 mg 4 times daily as needed for anxiety and trazodone 150 mg at bedtime for sleep.  She will return to see me in 3 months Collaboration of Care: Collaboration of Care: Primary Care Provider AEB notes are shared with PCP through the epic system  Patient/Guardian was advised Release of Information must be obtained prior to any record release in order to collaborate their care with an outside provider. Patient/Guardian was advised if they have not already done so to contact the registration department to sign all necessary forms in order for [place] to release information regarding their care.   Consent: Patient/Guardian gives verbal consent  for treatment and assignment of benefits for services provided during this visit. Patient/Guardian expressed understanding and agreed to proceed.    Katherine Spiller,  MD 10/24/2022, 2:54 PM

## 2022-12-25 ENCOUNTER — Emergency Department (HOSPITAL_COMMUNITY): Payer: Medicare Other

## 2022-12-25 ENCOUNTER — Encounter (HOSPITAL_COMMUNITY): Payer: Self-pay | Admitting: *Deleted

## 2022-12-25 ENCOUNTER — Other Ambulatory Visit: Payer: Self-pay

## 2022-12-25 ENCOUNTER — Emergency Department (HOSPITAL_COMMUNITY)
Admission: EM | Admit: 2022-12-25 | Discharge: 2022-12-25 | Disposition: A | Discharge status: Home / Self Care | Payer: Medicare Other | Attending: Emergency Medicine | Admitting: Emergency Medicine

## 2022-12-25 DIAGNOSIS — W06XXXA Fall from bed, initial encounter: Secondary | ICD-10-CM | POA: Diagnosis not present

## 2022-12-25 DIAGNOSIS — Z91040 Latex allergy status: Secondary | ICD-10-CM | POA: Diagnosis not present

## 2022-12-25 DIAGNOSIS — S42212A Unspecified displaced fracture of surgical neck of left humerus, initial encounter for closed fracture: Secondary | ICD-10-CM | POA: Diagnosis not present

## 2022-12-25 DIAGNOSIS — M25512 Pain in left shoulder: Secondary | ICD-10-CM | POA: Diagnosis present

## 2022-12-25 NOTE — ED Provider Notes (Signed)
[facility] EMERGENCY DEPARTMENT Provider Note   CSN: [id number] Arrival date & time: 12/25/22  1253     History  Chief Complaint  Patient presents with   Fall    Katherine Middleton is a 71 y.o. female presents to the ED from urgent care with complaint of left arm pain following a mechanical fall that occurred last week.  Patient states she fell out of bed onto the hardwood floor and landed on her left side.  She states she has left shoulder pain that radiates to her hand, but denies numbness and tingling.  She denies hitting her head and denies LOC.  Patient does not take blood thinners.  She denies other injuries associated with fall.  Patient does have history significant for osteoporosis.        Home Medications Prior to Admission medications   Medication Sig Start Date End Date Taking? Authorizing Provider  acetaminophen (TYLENOL) 650 MG CR tablet Take 650 mg by mouth daily as needed for pain.    [provider]  ALPRAZolam Duanne Moron) 0.5 MG tablet Take 1 tablet (0.5 mg total) by mouth 4 (four) times daily as needed. 10/24/22   Cloria Spring, MD  cetirizine (ZYRTEC) 10 MG tablet Take 10 mg by mouth at bedtime.    [provider]  Cholecalciferol (VITAMIN D3) 125 MCG (5000 UT) TABS Take 5,000 Units by mouth daily.    [provider]  CREON 24000-76000 units CPEP Take by mouth. 01/17/22   [provider]  gabapentin (NEURONTIN) 300 MG capsule Take 2 capsules (600 mg total) by mouth 2 (two) times daily. 10/24/22   Cloria Spring, MD  omeprazole (PRILOSEC) 20 MG capsule Take 20 mg by mouth daily.  09/04/13   [provider]  Pancrelipase, Lip-Prot-Amyl, (CREON) 24000-76000 units CPEP Take 2 capsules with each meal, 1 after starting/1 when finished eating, and 1 capsule with snack after starting to eat. 12/07/21   [provider]  PARoxetine (PAXIL) 40 MG tablet Take 1 tablet (40 mg total) by mouth every morning. 10/24/22   Cloria Spring, MD  Polyethyl Glyc-Propyl Glyc PF (SYSTANE ULTRA PF) 0.4-0.3 % SOLN Place 1 drop into both eyes daily as needed (Dry eye).    [provider]  sodium chloride (OCEAN) 0.65 % SOLN nasal spray Place 1 spray into both nostrils as needed for congestion.    [provider]  tiZANidine (ZANAFLEX) 4 MG tablet Take 4 mg by mouth every 8 (eight) hours as needed (muscle pain).    [provider]  traZODone (DESYREL) 150 MG tablet Take 1 tablet (150 mg total) by mouth at bedtime. 10/24/22   Cloria Spring, MD  vitamin B-12 (CYANOCOBALAMIN) 1000 MCG tablet Take 1,000 mcg by mouth daily.    [provider]      Allergies    Amoxicillin-pot clavulanate, Cephalosporins, Codeine, Cymbalta [duloxetine hcl], Duloxetine, Oxycodone, Penicillins, Baclofen, Povidone iodine, Flonase [fluticasone], Propoxyphene, Sulfa antibiotics, Sulfasalazine, Tramadol, Amitriptyline, Aspirin, Latex, Nsaids, and Tolmetin    Review of Systems   Review of Systems  Musculoskeletal:  Negative for joint swelling and neck pain.       Left shoulder pain  Neurological:  Negative for weakness, numbness and headaches.    Physical Exam Updated Vital Signs BP (!) 150/53 (BP Location: Right Arm)   Pulse (!) 59   Temp 99.4 F (37.4 C) (Oral)   Resp 16   SpO2 93%  Physical Exam Vitals and nursing note reviewed.  Constitutional:      General: She is not in acute distress.    Appearance: Normal appearance. She is not ill-appearing or diaphoretic.  HENT:     Head: Normocephalic and atraumatic.  Cardiovascular:     Rate and Rhythm: Normal rate and regular rhythm.     Pulses: Normal pulses.     Heart sounds: Normal heart sounds.  Pulmonary:     Effort: Pulmonary effort is normal.  Musculoskeletal:     Left shoulder: Swelling and bony tenderness present. No deformity. Decreased range of motion. Normal pulse.     Comments: Tenderness and swelling appreciated to proximal left upper  extremity just below the shoulder.  Left arm and hand neurovascularly intact.  No obvious gross deformity.  Grip strength is intact.   Neurological:     Mental Status: She is alert. Mental status is at baseline.  Psychiatric:        Mood and Affect: Mood normal.        Behavior: Behavior normal.     ED Results / Procedures / Treatments   Labs (all labs ordered are listed, but only abnormal results are displayed) Labs Reviewed - No data to display  EKG None  Radiology DG Humerus Left  Result Date: 12/25/2022 CLINICAL DATA:  Fall. EXAM: LEFT HUMERUS - 2+ VIEW COMPARISON:  None Available. FINDINGS: Mildly displaced proximal left humeral neck fracture is noted. IMPRESSION: Mildly displaced proximal left humeral neck fracture. Electronically Signed   By: Marijo Conception M.D.   On: 12/25/2022 14:17    Procedures Procedures    Medications Ordered in ED Medications - No data to display  ED Course/ Medical Decision Making/ A&P                           Medical Decision Making  This patient presents to the ED with chief complaint(s) of left shoulder pain following a mechanical fall with pertinent past medical history of osteoporosis .The complaint involves an extensive differential diagnosis and also carries with it a high risk of complications and morbidity.    The differential diagnosis includes acute fracture, acute dislocation, contusion, muscle strain or sprain   The initial plan is to obtain x-ray of shoulder/arm  Additional history obtained: Additional history obtained from family Records reviewed  none  Initial Assessment:   Exam significant for tenderness and swelling to the proximal left upper extremity just below the shoulder.  Left arm and hand are neurovascularly intact.  No obvious gross deformity, and grip strength is intact.  She is normocephalic, atraumatic.  No neck pain on palpation.  Lungs clear to auscultation bilaterally.  Heart rate is normal with regular  rhythm.   Independent ECG/labs interpretation:  The following labs were independently interpreted:  Not indicated  Independent visualization and interpretation of imaging: I independently visualized the following imaging with scope of interpretation limited to determining acute life threatening conditions related to emergency care: x-ray of humerus, which revealed acute fracture to the humeral neck.  I agree with radiologist interpretation.   Treatment and Reassessment: Patient was placed in a deluxe sling and swath.  She reports no pain and appears to be comfortable.  Left arm and hand remains neurovascularly intact.   Other treatment options considered:   N/A  Disposition:   The patient has been appropriately medically screened and/or stabilized in the ED. I have low suspicion for any other emergent medical condition which would require further screening, evaluation  or treatment in the ED or require inpatient management. At time of discharge the patient is hemodynamically stable and in no acute distress. I have discussed work-up results and diagnosis with patient and answered all questions. Patient is agreeable with discharge plan. We discussed strict return precautions for returning to the emergency department and they verbalized understanding.  Patient was given referral to orthopedics for outpatient follow-up.  She verbally expressed her understanding regarding need to schedule an appointment.  Recommended patient take Tylenol as needed for pain.           Final Clinical Impression(s) / ED Diagnoses Final diagnoses:  Closed displaced fracture of surgical neck of left humerus, unspecified fracture morphology, initial encounter    Rx / DC Orders ED Discharge Orders     None         Pat Kocher, [place] 12/25/22 1904    Isla Pence, MD 12/25/22 2349

## 2022-12-25 NOTE — ED Triage Notes (Signed)
Pt in with family, per pt she fell last week unable to state day, pt reports fell out of bed onto the floor, pt c/o L shoulder pain that radiates to the L hand, pt denies hitting head and denies LOC, pt does not take blood thinners, A&O x4

## 2022-12-25 NOTE — Discharge Instructions (Addendum)
Thank you for allowing me to be part of your care today.  You were placed in a sling and swath.  You can remove this when you are changing clothes or for showering purposes, however, limit use of left arm as it is broken.   I have included information for orthopedic follow-up.  Please give their office a call tomorrow to schedule an appointment.   I recommend taking Tylenol as needed for pain control.

## 2022-12-25 NOTE — ED Provider Triage Note (Signed)
Emergency Medicine Provider Triage Evaluation Note  Cortni Ruweyda Macknight , a 71 y.o. female  was evaluated in triage.  Pt complains of a fall.  Pt reports she fell an hit her shoulder.    Review of Systems  Positive: Drainage Negative: fever  Physical Exam  BP (!) 145/46 (BP Location: Right Arm)   Pulse (!) 54   Temp 99.4 F (37.4 C) (Oral)   Resp 18   SpO2 (!) 88%  Gen:   Awake, no distress   Resp:  Normal effort  MSK:   Moves extremities without difficulty  Other:    Medical Decision Making  Medically screening exam initiated at 1:07 PM.  Appropriate orders placed.  Charmon Secily Walthour was informed that the remainder of the evaluation will be completed by another provider, this initial triage assessment does not replace that evaluation, and the importance of remaining in the ED until their evaluation is complete.     Fransico Meadow, [place] 12/25/22 1351

## 2022-12-31 ENCOUNTER — Other Ambulatory Visit: Payer: Self-pay

## 2022-12-31 ENCOUNTER — Emergency Department (HOSPITAL_COMMUNITY): Payer: Medicare PPO

## 2022-12-31 ENCOUNTER — Encounter (HOSPITAL_COMMUNITY): Payer: Self-pay | Admitting: Emergency Medicine

## 2022-12-31 ENCOUNTER — Inpatient Hospital Stay (HOSPITAL_COMMUNITY)
Admission: EM | Admit: 2022-12-31 | Discharge: 2023-01-04 | DRG: 558 | Disposition: A | Discharge status: Skilled Nursing Facility | Payer: Medicare PPO | Attending: Family Medicine | Admitting: Family Medicine

## 2022-12-31 DIAGNOSIS — Z880 Allergy status to penicillin: Secondary | ICD-10-CM

## 2022-12-31 DIAGNOSIS — M6282 Rhabdomyolysis: Principal | ICD-10-CM | POA: Diagnosis present

## 2022-12-31 DIAGNOSIS — Z818 Family history of other mental and behavioral disorders: Secondary | ICD-10-CM

## 2022-12-31 DIAGNOSIS — Z882 Allergy status to sulfonamides status: Secondary | ICD-10-CM

## 2022-12-31 DIAGNOSIS — R627 Adult failure to thrive: Secondary | ICD-10-CM | POA: Diagnosis present

## 2022-12-31 DIAGNOSIS — Z886 Allergy status to analgesic agent status: Secondary | ICD-10-CM

## 2022-12-31 DIAGNOSIS — M797 Fibromyalgia: Secondary | ICD-10-CM | POA: Diagnosis present

## 2022-12-31 DIAGNOSIS — Z720 Tobacco use: Secondary | ICD-10-CM | POA: Insufficient documentation

## 2022-12-31 DIAGNOSIS — K219 Gastro-esophageal reflux disease without esophagitis: Secondary | ICD-10-CM | POA: Diagnosis present

## 2022-12-31 DIAGNOSIS — F319 Bipolar disorder, unspecified: Secondary | ICD-10-CM | POA: Diagnosis present

## 2022-12-31 DIAGNOSIS — Z79899 Other long term (current) drug therapy: Secondary | ICD-10-CM

## 2022-12-31 DIAGNOSIS — R531 Weakness: Secondary | ICD-10-CM

## 2022-12-31 DIAGNOSIS — Z885 Allergy status to narcotic agent status: Secondary | ICD-10-CM

## 2022-12-31 DIAGNOSIS — R159 Full incontinence of feces: Secondary | ICD-10-CM | POA: Diagnosis present

## 2022-12-31 DIAGNOSIS — W19XXXA Unspecified fall, initial encounter: Secondary | ICD-10-CM

## 2022-12-31 DIAGNOSIS — D490 Neoplasm of unspecified behavior of digestive system: Secondary | ICD-10-CM | POA: Insufficient documentation

## 2022-12-31 DIAGNOSIS — E785 Hyperlipidemia, unspecified: Secondary | ICD-10-CM | POA: Diagnosis present

## 2022-12-31 DIAGNOSIS — D539 Nutritional anemia, unspecified: Secondary | ICD-10-CM | POA: Diagnosis present

## 2022-12-31 DIAGNOSIS — S42212A Unspecified displaced fracture of surgical neck of left humerus, initial encounter for closed fracture: Secondary | ICD-10-CM | POA: Diagnosis present

## 2022-12-31 DIAGNOSIS — Z91040 Latex allergy status: Secondary | ICD-10-CM

## 2022-12-31 DIAGNOSIS — Z888 Allergy status to other drugs, medicaments and biological substances status: Secondary | ICD-10-CM

## 2022-12-31 DIAGNOSIS — Z9851 Tubal ligation status: Secondary | ICD-10-CM

## 2022-12-31 DIAGNOSIS — F17210 Nicotine dependence, cigarettes, uncomplicated: Secondary | ICD-10-CM | POA: Diagnosis present

## 2022-12-31 DIAGNOSIS — D136 Benign neoplasm of pancreas: Secondary | ICD-10-CM | POA: Diagnosis present

## 2022-12-31 DIAGNOSIS — R32 Unspecified urinary incontinence: Secondary | ICD-10-CM | POA: Diagnosis present

## 2022-12-31 DIAGNOSIS — R64 Cachexia: Secondary | ICD-10-CM | POA: Diagnosis present

## 2022-12-31 DIAGNOSIS — R296 Repeated falls: Principal | ICD-10-CM | POA: Diagnosis present

## 2022-12-31 DIAGNOSIS — Z881 Allergy status to other antibiotic agents status: Secondary | ICD-10-CM

## 2022-12-31 DIAGNOSIS — M549 Dorsalgia, unspecified: Secondary | ICD-10-CM | POA: Diagnosis present

## 2022-12-31 DIAGNOSIS — K8689 Other specified diseases of pancreas: Secondary | ICD-10-CM | POA: Diagnosis present

## 2022-12-31 DIAGNOSIS — W06XXXA Fall from bed, initial encounter: Secondary | ICD-10-CM | POA: Diagnosis present

## 2022-12-31 DIAGNOSIS — F32A Depression, unspecified: Secondary | ICD-10-CM | POA: Insufficient documentation

## 2022-12-31 DIAGNOSIS — I10 Essential (primary) hypertension: Secondary | ICD-10-CM | POA: Diagnosis present

## 2022-12-31 DIAGNOSIS — Y92009 Unspecified place in unspecified non-institutional (private) residence as the place of occurrence of the external cause: Secondary | ICD-10-CM

## 2022-12-31 DIAGNOSIS — Z8673 Personal history of transient ischemic attack (TIA), and cerebral infarction without residual deficits: Secondary | ICD-10-CM

## 2022-12-31 DIAGNOSIS — G8929 Other chronic pain: Secondary | ICD-10-CM | POA: Diagnosis present

## 2022-12-31 DIAGNOSIS — W19XXXD Unspecified fall, subsequent encounter: Secondary | ICD-10-CM

## 2022-12-31 DIAGNOSIS — R0902 Hypoxemia: Secondary | ICD-10-CM | POA: Diagnosis present

## 2022-12-31 DIAGNOSIS — E876 Hypokalemia: Secondary | ICD-10-CM | POA: Diagnosis present

## 2022-12-31 DIAGNOSIS — Z681 Body mass index (BMI) 19 or less, adult: Secondary | ICD-10-CM

## 2022-12-31 DIAGNOSIS — F419 Anxiety disorder, unspecified: Secondary | ICD-10-CM | POA: Diagnosis present

## 2022-12-31 DIAGNOSIS — R636 Underweight: Secondary | ICD-10-CM | POA: Diagnosis present

## 2022-12-31 LAB — URINALYSIS, ROUTINE W REFLEX MICROSCOPIC
Bilirubin Urine: NEGATIVE
Glucose, UA: NEGATIVE mg/dL
Ketones, ur: 20 mg/dL — AB
Leukocytes,Ua: NEGATIVE
Nitrite: NEGATIVE
Protein, ur: 100 mg/dL — AB
Specific Gravity, Urine: 1.017 (ref 1.005–1.030)
pH: 6 (ref 5.0–8.0)

## 2022-12-31 LAB — CBC WITH DIFFERENTIAL/PLATELET
Abs Immature Granulocytes: 0.03 10*3/uL (ref 0.00–0.07)
Basophils Absolute: 0 10*3/uL (ref 0.0–0.1)
Basophils Relative: 0 %
Eosinophils Absolute: 0 10*3/uL (ref 0.0–0.5)
Eosinophils Relative: 0 %
HCT: 37.3 % (ref 36.0–46.0)
Hemoglobin: 11.8 g/dL — ABNORMAL LOW (ref 12.0–15.0)
Immature Granulocytes: 0 %
Lymphocytes Relative: 7 %
Lymphs Abs: 0.7 10*3/uL (ref 0.7–4.0)
MCH: 32.5 pg (ref 26.0–34.0)
MCHC: 31.6 g/dL (ref 30.0–36.0)
MCV: 102.8 fL — ABNORMAL HIGH (ref 80.0–100.0)
Monocytes Absolute: 0.7 10*3/uL (ref 0.1–1.0)
Monocytes Relative: 7 %
Neutro Abs: 8.9 10*3/uL — ABNORMAL HIGH (ref 1.7–7.7)
Neutrophils Relative %: 86 %
Platelets: 414 10*3/uL — ABNORMAL HIGH (ref 150–400)
RBC: 3.63 MIL/uL — ABNORMAL LOW (ref 3.87–5.11)
RDW: 12.8 % (ref 11.5–15.5)
WBC: 10.4 10*3/uL (ref 4.0–10.5)
nRBC: 0 % (ref 0.0–0.2)

## 2022-12-31 LAB — COMPREHENSIVE METABOLIC PANEL
ALT: 21 U/L (ref 0–44)
AST: 60 U/L — ABNORMAL HIGH (ref 15–41)
Albumin: 3.7 g/dL (ref 3.5–5.0)
Alkaline Phosphatase: 88 U/L (ref 38–126)
Anion gap: 9 (ref 5–15)
BUN: 11 mg/dL (ref 8–23)
CO2: 32 mmol/L (ref 22–32)
Calcium: 8.9 mg/dL (ref 8.9–10.3)
Chloride: 101 mmol/L (ref 98–111)
Creatinine, Ser: 0.46 mg/dL (ref 0.44–1.00)
GFR, Estimated: >60 mL/min (ref >60)
Glucose, Bld: 138 mg/dL — ABNORMAL HIGH (ref 70–99)
Potassium: 4.1 mmol/L (ref 3.5–5.1)
Sodium: 142 mmol/L (ref 135–145)
Total Bilirubin: 0.7 mg/dL (ref 0.3–1.2)
Total Protein: 7 g/dL (ref 6.5–8.1)

## 2022-12-31 LAB — CK: Total CK: 1004 U/L — ABNORMAL HIGH (ref 38–234)

## 2022-12-31 MED ORDER — SODIUM CHLORIDE 0.9 % IV BOLUS
1000.0000 mL | Freq: Once | INTRAVENOUS | Status: AC
  Administered 2022-12-31: 1000 mL via INTRAVENOUS

## 2022-12-31 NOTE — ED Provider Notes (Signed)
[facility] Provider Note   CSN: [id number] Arrival date & time: 12/31/22  1622     History  Chief Complaint  Patient presents with   [facility] is a 72 y.o. female.  Patient is brought to the emergency department by her son.  Patient has had multiple falls.  Patient fell on 1226 and fractured her humeral head.  Patient's son reports that he went to check on her today and found her in the floor.  He is unsure of how long she was in the floor but he thinks it was approximately 8 hours.  He reports patient was confused when he found her.  He thinks that she hit her head.  Patient cannot recall the fall.  Patient's son is concerned that she can no longer care for herself at home.  RN reported patient incontinent of stool and urine.  The history is provided by a relative. The history is limited by a language barrier.  Fall This is a new problem. The problem occurs constantly. The problem has not changed since onset.Nothing aggravates the symptoms. Nothing relieves the symptoms. She has tried nothing for the symptoms.       Home Medications Prior to Admission medications   Medication Sig Start Date End Date Taking? Authorizing Provider  acetaminophen (TYLENOL) 650 MG CR tablet Take 650 mg by mouth daily as needed for pain.    [provider]  ALPRAZolam Duanne Moron) 0.5 MG tablet Take 1 tablet (0.5 mg total) by mouth 4 (four) times daily as needed. 10/24/22   Cloria Spring, MD  cetirizine (ZYRTEC) 10 MG tablet Take 10 mg by mouth at bedtime.    [provider]  Cholecalciferol (VITAMIN D3) 125 MCG (5000 UT) TABS Take 5,000 Units by mouth daily.    [provider]  CREON 24000-76000 units CPEP Take by mouth. 01/17/22   [provider]  gabapentin (NEURONTIN) 300 MG capsule Take 2 capsules (600 mg total) by mouth 2 (two) times daily. 10/24/22   Cloria Spring, MD  omeprazole (PRILOSEC) 20 MG capsule Take 20 mg by mouth  daily.  09/04/13   [provider]  Pancrelipase, Lip-Prot-Amyl, (CREON) 24000-76000 units CPEP Take 2 capsules with each meal, 1 after starting/1 when finished eating, and 1 capsule with snack after starting to eat. 12/07/21   [provider]  PARoxetine (PAXIL) 40 MG tablet Take 1 tablet (40 mg total) by mouth every morning. 10/24/22   Cloria Spring, MD  Polyethyl Glyc-Propyl Glyc PF (SYSTANE ULTRA PF) 0.4-0.3 % SOLN Place 1 drop into both eyes daily as needed (Dry eye).    [provider]  sodium chloride (OCEAN) 0.65 % SOLN nasal spray Place 1 spray into both nostrils as needed for congestion.    [provider]  tiZANidine (ZANAFLEX) 4 MG tablet Take 4 mg by mouth every 8 (eight) hours as needed (muscle pain).    [provider]  traZODone (DESYREL) 150 MG tablet Take 1 tablet (150 mg total) by mouth at bedtime. 10/24/22   Cloria Spring, MD  vitamin B-12 (CYANOCOBALAMIN) 1000 MCG tablet Take 1,000 mcg by mouth daily.    [provider]      Allergies    Amoxicillin-pot clavulanate, Cephalosporins, Codeine, Cymbalta [duloxetine hcl], Duloxetine, Oxycodone, Penicillins, Baclofen, Povidone iodine, Flonase [fluticasone], Propoxyphene, Sulfa antibiotics, Sulfasalazine, Tramadol, Amitriptyline, Aspirin, Latex, Nsaids, and Tolmetin    Review of Systems   Review of Systems  All other systems reviewed and are negative.   Physical Exam Updated Vital Signs BP (!) 166/59   Pulse (!) 57   Temp 97.8 F (36.6 C) (Oral)   Resp 15   SpO2 91%  Physical Exam Vitals and nursing note reviewed.  Constitutional:      Appearance: She is well-developed.  HENT:     Head: Normocephalic.     Mouth/Throat:     Mouth: Mucous membranes are moist.  Eyes:     Conjunctiva/sclera: Conjunctivae normal.     Pupils: Pupils are equal, round, and reactive to light.  Cardiovascular:     Rate and Rhythm: Normal rate.  Pulmonary:     Effort: Pulmonary effort  is normal.  Abdominal:     General: There is no distension.  Musculoskeletal:        General: Swelling present.     Cervical back: Normal range of motion.     Comments: Left shoulder bruising shoulder in sling  Neurological:     Mental Status: She is alert and oriented to person, place, and time.     ED Results / Procedures / Treatments   Labs (all labs ordered are listed, but only abnormal results are displayed) Labs Reviewed  CBC WITH DIFFERENTIAL/PLATELET - Abnormal; Notable for the following components:      Result Value   RBC 3.63 (*)    Hemoglobin 11.8 (*)    MCV 102.8 (*)    Platelets 414 (*)    Neutro Abs 8.9 (*)    All other components within normal limits  COMPREHENSIVE METABOLIC PANEL - Abnormal; Notable for the following components:   Glucose, Bld 138 (*)    AST 60 (*)    All other components within normal limits  CK - Abnormal; Notable for the following components:   Total CK 1,004 (*)    All other components within normal limits  URINALYSIS, ROUTINE W REFLEX MICROSCOPIC    EKG None  Radiology CT Head Wo Contrast  Result Date: 12/31/2022 CLINICAL DATA:  Head trauma multiple fall EXAM: CT HEAD WITHOUT CONTRAST TECHNIQUE: Contiguous axial images were obtained from the base of the skull through the vertex without intravenous contrast. RADIATION DOSE REDUCTION: This exam was performed according to the departmental dose-optimization program which includes automated exposure control, adjustment of the mA and/or kV according to patient size and/or use of iterative reconstruction technique. COMPARISON:  MRI 03/22/2021 FINDINGS: Brain: No acute territorial infarction, hemorrhage, or intracranial mass. Mild atrophy. Mild chronic small vessel ischemic changes of the white matter Vascular: No hyperdense vessels.  Carotid vascular calcification. Skull: Normal. Negative for fracture or focal lesion. Sinuses/Orbits: No acute finding. Other: None IMPRESSION: 1. No CT evidence for  acute intracranial abnormality. 2. Atrophy and chronic small vessel ischemic changes of the white matter. Electronically Signed   By: Donavan Foil M.D.   On: 12/31/2022 20:50    Procedures Procedures    Medications Ordered in ED Medications - No data to display  ED Course/ Medical Decision Making/ A&P                           Medical Decision Making Patient's son reports patient is having frequent falls  Amount and/or Complexity of Data Reviewed Independent Historian: caregiver    Details: Patient brought to the emergency department by her son who is primary historian External Data Reviewed: notes.    Details: Previous x-ray left humerus and ED notes reviewed Labs:  ordered. Decision-making details documented in ED Course.    Details: CK 1004.  UA is negative Radiology: ordered and independent interpretation performed. Decision-making details documented in ED Course.    Details: CT head shows no acute abnormality.  Chest x-ray is negative  Risk Risk Details: I discussed the patient with hospitalist Dr. Josephine Cables who will admit patient for IV fluids and further evaluation           Final Clinical Impression(s) / ED Diagnoses Final diagnoses:  Frequent falls  Weakness    Rx / DC Orders ED Discharge Orders     None         Sidney Ace 12/31/22 2321    Milton Ferguson, MD 01/02/23 1128

## 2022-12-31 NOTE — H&P (Incomplete)
History and Physical    Patient: Katherine Middleton:[id number] DOB: 1951/01/18 DOA: 12/31/2022 DOS: the patient was seen and examined on 01/01/2023 PCP: Aletha Halim., PA-C  Patient coming from: Home  Chief Complaint:  Chief Complaint  Patient presents with   Fall   HPI: Katherine Middleton is a 72 y.o. female with medical history significant of GERD, chronic back pain, hyperlipidemia, bipolar disorder type I, CVA, current tobacco use, and pancreatic insufficiency status post pancreatic duct dilatation who presents to the emergency department after being brought in by her son.  Patient has had several falls within most recent fall being yesterday.  Patient lives alone with her dog, she states that she sustained a fall and I had difficulty in getting up, she was unsure how long she was on the floor.  Per ED medical record, son went to check on patient today and find her on the floor, was unsure how long she has been on the floor, but thinks it was approximately 8 hours.  She was confused when he saw her and thought she could have hit her head.  Patient's son was concerned that patient can no longer take care of herself at home, she was reported to have both stool and urinary incontinence in the ED. Patient was seen in the ED on 12/26 due to left shoulder pain following a mechanical fall that occurred a week prior to presentation.  Left humerus x-ray taken at that time showed mildly displaced proximal left humeral neck fracture, she was provided with a sling, she was advised to take Tylenol as needed for pain and to follow-up with outpatient orthopedics.  ED Course:  In the emergency department, temperature was 97.5, BP was 160/48, other vital signs were within normal range.  Workup in the ED showed macrocytic anemia, BMP was normal except for blood glucose of 138, AST 60, total CK 1004, urinalysis was unimpressive for UTI. Chest x-ray showed no active disease CT head without contrast showed no  evidence for acute intracranial abnormality IV hydration was provided.  Hospitalist was asked to admit patient for further evaluation and management.  Review of Systems: Review of systems as noted in the HPI. All other systems reviewed and are negative.   Past Medical History:  Diagnosis Date   Anxiety    Arthritis    Bipolar disorder ([place])    Depression    Fibromyalgia    GERD (gastroesophageal reflux disease) 12/31/2002   HLD (hyperlipidemia)    diet controlled - no meds   HSV infection    Liver mass 07/2021   Osteoarthritis    Prolonged Q-T interval on ECG 02/2021   Sinusitis 12/31/2012   Wears glasses    Past Surgical History:  Procedure Laterality Date   BIOPSY  08/24/2021   Procedure: BIOPSY;  Surgeon: Irving Copas., MD;  Location: [facility];  Service: Gastroenterology;;   BIOPSY  09/11/2021   Procedure: BIOPSY;  Surgeon: Irving Copas., MD;  Location: [facility];  Service: Gastroenterology;;   ESOPHAGOGASTRODUODENOSCOPY (EGD) WITH PROPOFOL N/A 08/11/2021   Procedure: ESOPHAGOGASTRODUODENOSCOPY (EGD) WITH PROPOFOL;  Surgeon: Carol Ada, MD;  Location: [facility];  Service: Endoscopy;  Laterality: N/A;   ESOPHAGOGASTRODUODENOSCOPY (EGD) WITH PROPOFOL N/A 08/24/2021   Procedure: ESOPHAGOGASTRODUODENOSCOPY (EGD) WITH PROPOFOL;  Surgeon: Rush Landmark Telford Nab., MD;  Location: [facility];  Service: Gastroenterology;  Laterality: N/A;   ESOPHAGOGASTRODUODENOSCOPY (EGD) WITH PROPOFOL N/A 09/11/2021   Procedure: ESOPHAGOGASTRODUODENOSCOPY (EGD) WITH PROPOFOL;  Surgeon: Rush Landmark Telford Nab., MD;  Location:  [facility];  Service: Gastroenterology;  Laterality: N/A;   EUS N/A 08/24/2021   Procedure: UPPER ENDOSCOPIC ULTRASOUND (EUS) LINEAR;  Surgeon: Irving Copas., MD;  Location: [facility];  Service: Gastroenterology;  Laterality: N/A;   EUS N/A 09/11/2021   Procedure: UPPER ENDOSCOPIC ULTRASOUND (EUS) LINEAR;  Surgeon: Irving Copas., MD;  Location: [facility];  Service: Gastroenterology;  Laterality: N/A;   FINE NEEDLE ASPIRATION  08/24/2021   Procedure: FINE NEEDLE ASPIRATION;  Surgeon: Rush Landmark Telford Nab., MD;  Location: [facility];  Service: Gastroenterology;;   FINE NEEDLE ASPIRATION  09/11/2021   Procedure: FINE NEEDLE ASPIRATION (FNA) LINEAR;  Surgeon: Irving Copas., MD;  Location: [facility];  Service: Gastroenterology;;  Delaine Lame. not in system to charge   TONSILLECTOMY  04/08/57   some date around then   TUBAL LIGATION  10/21/1984   UPPER ESOPHAGEAL ENDOSCOPIC ULTRASOUND (EUS) N/A 08/11/2021   Procedure: UPPER ESOPHAGEAL ENDOSCOPIC ULTRASOUND (EUS);  Surgeon: Carol Ada, MD;  Location: [facility];  Service: Endoscopy;  Laterality: N/A;    Social History:  reports that she has been smoking cigarettes. She has a 15.00 pack-year smoking history. She has never used smokeless tobacco. She reports current alcohol use. She reports that she does not use drugs.   Allergies  Allergen Reactions   Amoxicillin-Pot Clavulanate Anaphylaxis and Swelling   Cephalosporins Anaphylaxis    Other reaction(s): Unknown   Codeine Itching   Cymbalta [Duloxetine Hcl] Other (See Comments)    GERD   Duloxetine     Other reaction(s): Other (See Comments), Unknown GERD    Oxycodone Itching and Other (See Comments)    "OUT THERE", climbing the walls   Penicillins Anaphylaxis and Swelling    Throat swelling Reaction: 8-10 years ago   Baclofen Other (See Comments)    Anxiety got much worse and possibly ppted manic episode Other reaction(s): Unknown   Povidone Iodine Itching and Other (See Comments)    Particularly if in a douche  Other reaction(s): Unknown   Flonase [Fluticasone]     Makes eye pressure to increase   Propoxyphene Itching    Anxiety/jittery    Sulfa Antibiotics Swelling   Sulfasalazine Swelling    Other reaction(s): Unknown   Tramadol     Other reaction(s): Other (See Comments),  Unknown Patient states that this makes her nervous    Amitriptyline Other (See Comments)    Caused bowels to empty quickly and couldn't sleep on it. Other reaction(s): Other (See Comments), Unknown Caused bowels to empty quickly and couldn't sleep on it. Caused bowels to empty quickly and couldn't sleep on it. Caused bowels to empty quickly and couldn't sleep on it.    Aspirin Hives and Rash   Latex Swelling and Other (See Comments)    If in the mouth, it swells   Nsaids Nausea Only and Other (See Comments)    Almost passing out, bad acid reflux Other reaction(s): Other (See Comments) other   Tolmetin Nausea Only    Other reaction(s): Other (See Comments), Unknown Almost passing out, bad acid reflux Almost passing out, bad acid reflux     Family History  Problem Relation Age of Onset   Bipolar disorder Sister    Anxiety disorder Sister    Dementia Sister 75   Paranoid behavior Sister    Bipolar disorder Father    Schizophrenia Father    Alcohol abuse Maternal Uncle    Alcohol abuse Paternal Uncle    Anxiety disorder Mother  ADD / ADHD Other    ADD / ADHD Other    Healthy Other    OCD Neg Hx    Seizures Neg Hx    Sexual abuse Neg Hx    Physical abuse Neg Hx      Prior to Admission medications   Medication Sig Start Date End Date Taking? Authorizing Provider  acetaminophen (TYLENOL) 650 MG CR tablet Take 650 mg by mouth daily as needed for pain.    [provider]  ALPRAZolam Duanne Moron) 0.5 MG tablet Take 1 tablet (0.5 mg total) by mouth 4 (four) times daily as needed. 10/24/22   Cloria Spring, MD  cetirizine (ZYRTEC) 10 MG tablet Take 10 mg by mouth at bedtime.    [provider]  Cholecalciferol (VITAMIN D3) 125 MCG (5000 UT) TABS Take 5,000 Units by mouth daily.    [provider]  CREON 24000-76000 units CPEP Take by mouth. 01/17/22   [provider]  gabapentin (NEURONTIN) 300 MG capsule Take 2 capsules (600 mg total) by mouth  2 (two) times daily. 10/24/22   Cloria Spring, MD  omeprazole (PRILOSEC) 20 MG capsule Take 20 mg by mouth daily.  09/04/13   [provider]  Pancrelipase, Lip-Prot-Amyl, (CREON) 24000-76000 units CPEP Take 2 capsules with each meal, 1 after starting/1 when finished eating, and 1 capsule with snack after starting to eat. 12/07/21   [provider]  PARoxetine (PAXIL) 40 MG tablet Take 1 tablet (40 mg total) by mouth every morning. 10/24/22   Cloria Spring, MD  Polyethyl Glyc-Propyl Glyc PF (SYSTANE ULTRA PF) 0.4-0.3 % SOLN Place 1 drop into both eyes daily as needed (Dry eye).    [provider]  sodium chloride (OCEAN) 0.65 % SOLN nasal spray Place 1 spray into both nostrils as needed for congestion.    [provider]  tiZANidine (ZANAFLEX) 4 MG tablet Take 4 mg by mouth every 8 (eight) hours as needed (muscle pain).    [provider]  traZODone (DESYREL) 150 MG tablet Take 1 tablet (150 mg total) by mouth at bedtime. 10/24/22   Cloria Spring, MD  vitamin B-12 (CYANOCOBALAMIN) 1000 MCG tablet Take 1,000 mcg by mouth daily.    [provider]    Physical Exam: BP (!) 153/64 (BP Location: Right Arm)   Pulse (!) 56   Temp 98.1 F (36.7 C) (Oral)   Resp 17   Ht '5\' 4"'$  (1.626 m)   Wt 46.6 kg   SpO2 96%   BMI 17.63 kg/m   General: 72 y.o. year-old female cachectic, but in no acute distress.  Alert and oriented x3. HEENT: NCAT, EOMI Neck: Supple, trachea medial Cardiovascular: Regular rate and rhythm with no rubs or gallops.  No thyromegaly or JVD noted.  No lower extremity edema. 2/4 pulses in all 4 extremities. Respiratory: Clear to auscultation with no wheezes or rales. Good inspiratory effort. Abdomen: Soft, nontender nondistended with normal bowel sounds x4 quadrants. Muskuloskeletal: Tenderness to palpation of the shoulder, restricted ROM due to pain.   Neuro: CN II-XII intact, strength 5/5 x 4, sensation, reflexes intact Skin:  No ulcerative lesions noted or rashes Psychiatry: Mood is appropriate for condition and setting          Labs on Admission:  Basic Metabolic Panel: Recent Labs  Lab 12/31/22 1723  NA 142  K 4.1  CL 101  CO2 32  GLUCOSE 138*  BUN 11  CREATININE 0.46  CALCIUM 8.9  Liver Function Tests: Recent Labs  Lab 12/31/22 1723  AST 60*  ALT 21  ALKPHOS 88  BILITOT 0.7  PROT 7.0  ALBUMIN 3.7   No results for input(s): "LIPASE", "AMYLASE" in the last 168 hours. No results for input(s): "AMMONIA" in the last 168 hours. CBC: Recent Labs  Lab 12/31/22 1723  WBC 10.4  NEUTROABS 8.9*  HGB 11.8*  HCT 37.3  MCV 102.8*  PLT 414*   Cardiac Enzymes: Recent Labs  Lab 12/31/22 1723  CKTOTAL 1,004*    BNP (last 3 results) No results for input(s): "BNP" in the last 8760 hours.  ProBNP (last 3 results) No results for input(s): "PROBNP" in the last 8760 hours.  CBG: No results for input(s): "GLUCAP" in the last 168 hours.  Radiological Exams on Admission: DG Chest Port 1 View  Result Date: 12/31/2022 CLINICAL DATA:  Cough. Multiple falls over last few weeks. Broke left shoulder 4 weeks ago. EXAM: PORTABLE CHEST 1 VIEW COMPARISON:  11/20/2016, 07/17/2021. FINDINGS: The heart size and mediastinal contours are within normal limits. There is atherosclerotic calcification of the aorta. No consolidation, effusion, or pneumothorax. There is bony deformity of the proximal left humerus, compatible with known fracture. There is a compression fracture at T11 which is unchanged from the previous exam. IMPRESSION: No active disease. Electronically Signed   By: Brett Fairy M.D.   On: 12/31/2022 22:44   CT Head Wo Contrast  Result Date: 12/31/2022 CLINICAL DATA:  Head trauma multiple fall EXAM: CT HEAD WITHOUT CONTRAST TECHNIQUE: Contiguous axial images were obtained from the base of the skull through the vertex without intravenous contrast. RADIATION DOSE REDUCTION: This exam was performed  according to the departmental dose-optimization program which includes automated exposure control, adjustment of the mA and/or kV according to patient size and/or use of iterative reconstruction technique. COMPARISON:  MRI 03/22/2021 FINDINGS: Brain: No acute territorial infarction, hemorrhage, or intracranial mass. Mild atrophy. Mild chronic small vessel ischemic changes of the white matter Vascular: No hyperdense vessels.  Carotid vascular calcification. Skull: Normal. Negative for fracture or focal lesion. Sinuses/Orbits: No acute finding. Other: None IMPRESSION: 1. No CT evidence for acute intracranial abnormality. 2. Atrophy and chronic small vessel ischemic changes of the white matter. Electronically Signed   By: Donavan Foil M.D.   On: 12/31/2022 20:50    EKG: I independently viewed the EKG done and my findings are as followed: EKG was not done in the ED  Assessment/Plan Present on Admission:  Rhabdomyolysis  Chronic back pain  GERD (gastroesophageal reflux disease)  Anxiety  Principal Problem:   Rhabdomyolysis Active Problems:   GERD (gastroesophageal reflux disease)   Anxiety   Chronic back pain   Fall at home, initial encounter   Failure to thrive in adult   Underweight   Closed displaced fracture of surgical neck of left humerus   Generalized weakness   Macrocytic anemia   Pancreatic insufficiency   Depression   Tobacco abuse  Rhabdomyolysis Total CK 1004 Patient was started on IV hydration, continue IV hydration Continue to monitor total CK  Recurrent falls at home Closed displaced fracture of surgical neck of left humerus Patient has had recurrent falls at home.  She sustained humeral fracture during prior fall Continue with sling Continue fall precaution Continue PT/OT eval and treat  Generalized weakness Failure to thrive in adult/underweight (BMI 17.65) Protein supplement to be provided Dietitian will be consulted and we shall await further  recommendation  Macrocytic anemia MCV 102.8, continue vitamin B12  GERD Continue Protonix  Pancreatic insufficiency Continue Creon  Chronic back pain Continue gabapentin, Zanaflex  Anxiety/depression Continue Paxil, and Xanax  Tobacco abuse Patient was counseled on tobacco abuse cessation  Goals of care Patient lives alone and has been having recurrent falls, she's failing to thrive, arrived to the ED showed with fecal material and smelled of urine.  TOC was consulted for placement prior to discharge   DVT prophylaxis: Lovenox  Code Status: Full code  Family Communication: None at bedside  Consults: Dietitian  Severity of Illness: The appropriate patient status for this patient is INPATIENT. Inpatient status is judged to be reasonable and necessary in order to provide the required intensity of service to ensure the patient's safety. The patient's presenting symptoms, physical exam findings, and initial radiographic and laboratory data in the context of their chronic comorbidities is felt to place them at high risk for further clinical deterioration. Furthermore, it is not anticipated that the patient will be medically stable for discharge from the hospital within 2 midnights of admission.   * I certify that at the point of admission it is my clinical judgment that the patient will require inpatient hospital care spanning beyond 2 midnights from the point of admission due to high intensity of service, high risk for further deterioration and high frequency of surveillance required.*  Author: Bernadette Hoit, DO 01/01/2023 1:43 AM  For on call review [email or web address].

## 2022-12-31 NOTE — ED Triage Notes (Signed)
Pt multiple falls over last few weeks. Broke L shoulder 4 weeks ago. Pt lives alone. [place] today c/o same shoulder pain. Arm sling currently on, bruising noted from upper arm down. Radial pulse present. Pt slow to respond which is her baseline per ems, per son at the home.

## 2023-01-01 ENCOUNTER — Ambulatory Visit: Payer: Medicare Other | Admitting: Orthopedic Surgery

## 2023-01-01 ENCOUNTER — Observation Stay (HOSPITAL_COMMUNITY): Payer: Medicare PPO

## 2023-01-01 DIAGNOSIS — W06XXXA Fall from bed, initial encounter: Secondary | ICD-10-CM | POA: Diagnosis present

## 2023-01-01 DIAGNOSIS — M797 Fibromyalgia: Secondary | ICD-10-CM | POA: Diagnosis present

## 2023-01-01 DIAGNOSIS — F32A Depression, unspecified: Secondary | ICD-10-CM | POA: Insufficient documentation

## 2023-01-01 DIAGNOSIS — E876 Hypokalemia: Secondary | ICD-10-CM | POA: Diagnosis present

## 2023-01-01 DIAGNOSIS — Z8673 Personal history of transient ischemic attack (TIA), and cerebral infarction without residual deficits: Secondary | ICD-10-CM | POA: Diagnosis not present

## 2023-01-01 DIAGNOSIS — S42202A Unspecified fracture of upper end of left humerus, initial encounter for closed fracture: Secondary | ICD-10-CM

## 2023-01-01 DIAGNOSIS — Z720 Tobacco use: Secondary | ICD-10-CM | POA: Diagnosis not present

## 2023-01-01 DIAGNOSIS — S42212A Unspecified displaced fracture of surgical neck of left humerus, initial encounter for closed fracture: Secondary | ICD-10-CM | POA: Insufficient documentation

## 2023-01-01 DIAGNOSIS — D539 Nutritional anemia, unspecified: Secondary | ICD-10-CM | POA: Insufficient documentation

## 2023-01-01 DIAGNOSIS — Y92009 Unspecified place in unspecified non-institutional (private) residence as the place of occurrence of the external cause: Secondary | ICD-10-CM

## 2023-01-01 DIAGNOSIS — R531 Weakness: Secondary | ICD-10-CM

## 2023-01-01 DIAGNOSIS — D136 Benign neoplasm of pancreas: Secondary | ICD-10-CM | POA: Diagnosis present

## 2023-01-01 DIAGNOSIS — M549 Dorsalgia, unspecified: Secondary | ICD-10-CM | POA: Diagnosis present

## 2023-01-01 DIAGNOSIS — K8689 Other specified diseases of pancreas: Secondary | ICD-10-CM | POA: Insufficient documentation

## 2023-01-01 DIAGNOSIS — R636 Underweight: Secondary | ICD-10-CM | POA: Diagnosis present

## 2023-01-01 DIAGNOSIS — W19XXXA Unspecified fall, initial encounter: Secondary | ICD-10-CM

## 2023-01-01 DIAGNOSIS — K219 Gastro-esophageal reflux disease without esophagitis: Secondary | ICD-10-CM | POA: Diagnosis present

## 2023-01-01 DIAGNOSIS — R159 Full incontinence of feces: Secondary | ICD-10-CM | POA: Diagnosis present

## 2023-01-01 DIAGNOSIS — G8929 Other chronic pain: Secondary | ICD-10-CM | POA: Diagnosis present

## 2023-01-01 DIAGNOSIS — F17210 Nicotine dependence, cigarettes, uncomplicated: Secondary | ICD-10-CM | POA: Diagnosis present

## 2023-01-01 DIAGNOSIS — R0902 Hypoxemia: Secondary | ICD-10-CM | POA: Diagnosis present

## 2023-01-01 DIAGNOSIS — R627 Adult failure to thrive: Secondary | ICD-10-CM | POA: Diagnosis not present

## 2023-01-01 DIAGNOSIS — I10 Essential (primary) hypertension: Secondary | ICD-10-CM | POA: Diagnosis present

## 2023-01-01 DIAGNOSIS — E785 Hyperlipidemia, unspecified: Secondary | ICD-10-CM | POA: Diagnosis present

## 2023-01-01 DIAGNOSIS — M6282 Rhabdomyolysis: Secondary | ICD-10-CM | POA: Diagnosis not present

## 2023-01-01 DIAGNOSIS — F319 Bipolar disorder, unspecified: Secondary | ICD-10-CM | POA: Diagnosis present

## 2023-01-01 DIAGNOSIS — Z681 Body mass index (BMI) 19 or less, adult: Secondary | ICD-10-CM | POA: Diagnosis not present

## 2023-01-01 DIAGNOSIS — F419 Anxiety disorder, unspecified: Secondary | ICD-10-CM | POA: Diagnosis present

## 2023-01-01 DIAGNOSIS — S42212D Unspecified displaced fracture of surgical neck of left humerus, subsequent encounter for fracture with routine healing: Secondary | ICD-10-CM | POA: Diagnosis not present

## 2023-01-01 DIAGNOSIS — R296 Repeated falls: Secondary | ICD-10-CM | POA: Diagnosis present

## 2023-01-01 DIAGNOSIS — R64 Cachexia: Secondary | ICD-10-CM | POA: Diagnosis present

## 2023-01-01 DIAGNOSIS — R32 Unspecified urinary incontinence: Secondary | ICD-10-CM | POA: Diagnosis present

## 2023-01-01 DIAGNOSIS — D490 Neoplasm of unspecified behavior of digestive system: Secondary | ICD-10-CM | POA: Insufficient documentation

## 2023-01-01 LAB — CBC
HCT: 34.5 % — ABNORMAL LOW (ref 36.0–46.0)
Hemoglobin: 10.9 g/dL — ABNORMAL LOW (ref 12.0–15.0)
MCH: 32 pg (ref 26.0–34.0)
MCHC: 31.6 g/dL (ref 30.0–36.0)
MCV: 101.2 fL — ABNORMAL HIGH (ref 80.0–100.0)
Platelets: 425 10*3/uL — ABNORMAL HIGH (ref 150–400)
RBC: 3.41 MIL/uL — ABNORMAL LOW (ref 3.87–5.11)
RDW: 13 % (ref 11.5–15.5)
WBC: 6.8 10*3/uL (ref 4.0–10.5)
nRBC: 0 % (ref 0.0–0.2)

## 2023-01-01 LAB — COMPREHENSIVE METABOLIC PANEL
ALT: 25 U/L (ref 0–44)
AST: 79 U/L — ABNORMAL HIGH (ref 15–41)
Albumin: 3.2 g/dL — ABNORMAL LOW (ref 3.5–5.0)
Alkaline Phosphatase: 78 U/L (ref 38–126)
Anion gap: 8 (ref 5–15)
BUN: 8 mg/dL (ref 8–23)
CO2: 31 mmol/L (ref 22–32)
Calcium: 8.3 mg/dL — ABNORMAL LOW (ref 8.9–10.3)
Chloride: 100 mmol/L (ref 98–111)
Creatinine, Ser: 0.46 mg/dL (ref 0.44–1.00)
GFR, Estimated: >60 mL/min (ref >60)
Glucose, Bld: 110 mg/dL — ABNORMAL HIGH (ref 70–99)
Potassium: 3 mmol/L — ABNORMAL LOW (ref 3.5–5.1)
Sodium: 139 mmol/L (ref 135–145)
Total Bilirubin: 0.7 mg/dL (ref 0.3–1.2)
Total Protein: 6.1 g/dL — ABNORMAL LOW (ref 6.5–8.1)

## 2023-01-01 LAB — MAGNESIUM: Magnesium: 2 mg/dL (ref 1.7–2.4)

## 2023-01-01 LAB — VITAMIN B12: Vitamin B-12: 1194 pg/mL — ABNORMAL HIGH (ref 180–914)

## 2023-01-01 LAB — CK: Total CK: 1670 U/L — ABNORMAL HIGH (ref 38–234)

## 2023-01-01 LAB — FOLATE: Folate: 19.6 ng/mL (ref >5.9)

## 2023-01-01 LAB — TSH: TSH: 2.099 u[IU]/mL (ref 0.350–4.500)

## 2023-01-01 LAB — PHOSPHORUS: Phosphorus: 2.5 mg/dL (ref 2.5–4.6)

## 2023-01-01 LAB — T4, FREE: Free T4: 1.05 ng/dL (ref 0.61–1.12)

## 2023-01-01 MED ORDER — POTASSIUM CHLORIDE CRYS ER 20 MEQ PO TBCR
20.0000 meq | EXTENDED_RELEASE_TABLET | Freq: Once | ORAL | Status: AC
  Administered 2023-01-01: 20 meq via ORAL
  Filled 2023-01-01: qty 1

## 2023-01-01 MED ORDER — ACETAMINOPHEN 650 MG RE SUPP: 650.0000 mg | Freq: Four times a day (QID) | RECTAL | Status: DC | PRN

## 2023-01-01 MED ORDER — TIZANIDINE HCL 4 MG PO TABS
4.0000 mg | ORAL_TABLET | Freq: Three times a day (TID) | ORAL | Status: DC | PRN
  Administered 2023-01-03: 4 mg via ORAL
  Filled 2023-01-01: qty 1

## 2023-01-01 MED ORDER — ENSURE ENLIVE PO LIQD
237.0000 mL | Freq: Two times a day (BID) | ORAL | Status: DC
  Administered 2023-01-01 – 2023-01-02 (×2): 237 mL via ORAL

## 2023-01-01 MED ORDER — ALUM & MAG HYDROXIDE-SIMETH 200-200-20 MG/5ML PO SUSP
30.0000 mL | ORAL | Status: DC | PRN
  Administered 2023-01-01: 30 mL via ORAL
  Filled 2023-01-01: qty 30

## 2023-01-01 MED ORDER — ONDANSETRON HCL 4 MG PO TABS: 4.0000 mg | ORAL_TABLET | Freq: Four times a day (QID) | ORAL | Status: DC | PRN

## 2023-01-01 MED ORDER — PANCRELIPASE (LIP-PROT-AMYL) 12000-38000 UNITS PO CPEP
24000.0000 [IU] | ORAL_CAPSULE | Freq: Every day | ORAL | Status: DC
  Administered 2023-01-01 – 2023-01-02 (×2): 24000 [IU] via ORAL
  Filled 2023-01-01 (×2): qty 2

## 2023-01-01 MED ORDER — ENOXAPARIN SODIUM 40 MG/0.4ML IJ SOSY
40.0000 mg | PREFILLED_SYRINGE | INTRAMUSCULAR | Status: DC
  Administered 2023-01-01 – 2023-01-04 (×4): 40 mg via SUBCUTANEOUS
  Filled 2023-01-01 (×4): qty 0.4

## 2023-01-01 MED ORDER — POTASSIUM CHLORIDE IN NACL 20-0.9 MEQ/L-% IV SOLN: INTRAVENOUS | Status: DC

## 2023-01-01 MED ORDER — VITAMIN D 25 MCG (1000 UNIT) PO TABS
5000.0000 [IU] | ORAL_TABLET | Freq: Every day | ORAL | Status: DC
  Administered 2023-01-01 – 2023-01-04 (×4): 5000 [IU] via ORAL
  Filled 2023-01-01 (×4): qty 5

## 2023-01-01 MED ORDER — PAROXETINE HCL 20 MG PO TABS
40.0000 mg | ORAL_TABLET | Freq: Every day | ORAL | Status: DC
  Administered 2023-01-01 – 2023-01-04 (×4): 40 mg via ORAL
  Filled 2023-01-01 (×4): qty 2

## 2023-01-01 MED ORDER — ONDANSETRON HCL 4 MG/2ML IJ SOLN: 4.0000 mg | Freq: Four times a day (QID) | INTRAMUSCULAR | Status: DC | PRN

## 2023-01-01 MED ORDER — ACETAMINOPHEN 325 MG PO TABS
650.0000 mg | ORAL_TABLET | Freq: Four times a day (QID) | ORAL | Status: DC | PRN
  Administered 2023-01-01 – 2023-01-04 (×8): 650 mg via ORAL
  Filled 2023-01-01 (×7): qty 2

## 2023-01-01 MED ORDER — IPRATROPIUM-ALBUTEROL 0.5-2.5 (3) MG/3ML IN SOLN: 3.0000 mL | RESPIRATORY_TRACT | Status: DC | PRN

## 2023-01-01 MED ORDER — GABAPENTIN 300 MG PO CAPS: 600.0000 mg | ORAL_CAPSULE | Freq: Two times a day (BID) | ORAL | Status: DC

## 2023-01-01 MED ORDER — GABAPENTIN 300 MG PO CAPS
300.0000 mg | ORAL_CAPSULE | Freq: Two times a day (BID) | ORAL | Status: DC
  Administered 2023-01-01 – 2023-01-04 (×7): 300 mg via ORAL
  Filled 2023-01-01 (×7): qty 1

## 2023-01-01 MED ORDER — ORAL CARE MOUTH RINSE: 15.0000 mL | OROMUCOSAL | Status: DC | PRN

## 2023-01-01 MED ORDER — PANTOPRAZOLE SODIUM 40 MG PO TBEC
40.0000 mg | DELAYED_RELEASE_TABLET | Freq: Every day | ORAL | Status: DC
  Administered 2023-01-01 – 2023-01-04 (×4): 40 mg via ORAL
  Filled 2023-01-01 (×4): qty 1

## 2023-01-01 MED ORDER — VITAMIN B-12 1000 MCG PO TABS
1000.0000 ug | ORAL_TABLET | Freq: Every day | ORAL | Status: DC
  Administered 2023-01-01 – 2023-01-04 (×4): 1000 ug via ORAL
  Filled 2023-01-01 (×4): qty 1

## 2023-01-01 MED ORDER — ADULT MULTIVITAMIN W/MINERALS CH
1.0000 | ORAL_TABLET | Freq: Every day | ORAL | Status: DC
  Administered 2023-01-01 – 2023-01-04 (×4): 1 via ORAL
  Filled 2023-01-01 (×4): qty 1

## 2023-01-01 MED ORDER — PNEUMOCOCCAL 20-VAL CONJ VACC 0.5 ML IM SUSY
0.5000 mL | PREFILLED_SYRINGE | INTRAMUSCULAR | Status: DC
  Filled 2023-01-01: qty 0.5

## 2023-01-01 MED ORDER — SODIUM CHLORIDE 0.9 % IV SOLN: INTRAVENOUS | Status: DC

## 2023-01-01 MED ORDER — ALPRAZOLAM 0.5 MG PO TABS
0.5000 mg | ORAL_TABLET | Freq: Four times a day (QID) | ORAL | Status: DC | PRN
  Administered 2023-01-01 – 2023-01-04 (×7): 0.5 mg via ORAL
  Filled 2023-01-01 (×7): qty 1

## 2023-01-01 NOTE — Hospital Course (Addendum)
73 year old female with a history of chronic back pain, GERD, bipolar disorder, hyperlipidemia, stroke, tobacco abuse (30+ pack years), pancreatic IPMN presenting with a fall.  The patient has had frequent falls.  Notably, the patient had a mechanical fall a few days before Christmas.  She came to the ED on 12/25/2022.  X-rays of her left arm showed a mildly displaced proximal left humerus fracture.  She was placed in a sling and set up for outpatient follow-up with orthopedics.  Unfortunately, the patient continued to have generalized weakness and gait instability.  She states that she fell out of bed again in the evening of 12/30/2022.  She had difficulty getting up secondary to generalized weakness.  Her son found her on the floor when he went to check up on her on 12/31/2022.  As result, she was brought to the emergency department for further evaluation and treatment.  Patient denies fevers, chills, headache, chest pain, dyspnea, nausea, vomiting, diarrhea, abdominal pain, dysuria, hematuria, hematochezia, and melena.  In the ED, the patient was afebrile and hemodynamically stable with oxygen saturation 89% on room air.  WBC 10.4, hemoglobin 11.8, platelets 414,000.  sodium 139, potassium 3.0, bicarbonate 31, serum creatinine 0.46.  AST 79, ALT 25, alk phosphatase 70, total bilirubin 0.7.  Magnesium 2.0.  Phosphorus 2.5. CPK 1004.  UA was negative for pyuria.  CT of the brain was negative for acute findings.  Chest x-ray was negative for any acute findings.  Orthopedics was consulted to assist with management.  PT evaluation was requested.

## 2023-01-01 NOTE — Progress Notes (Signed)
PROGRESS NOTE  Katherine Middleton TJQ:[id number] DOB: 1951-12-28 DOA: 12/31/2022 PCP: Aletha Halim., PA-C  Brief History:  72 year old female with a history of chronic back pain, GERD, bipolar disorder, hyperlipidemia, stroke, tobacco abuse (30+ pack years), pancreatic IPMN presenting with a fall.  The patient has had frequent falls.  Notably, the patient had a mechanical fall a few days before Christmas.  She came to the ED on 12/25/2022.  X-rays of her left arm showed a mildly displaced proximal left humerus fracture.  She was placed in a sling and set up for outpatient follow-up with orthopedics.  Unfortunately, the patient continued to have generalized weakness and gait instability.  She states that she fell out of bed again in the evening of 12/30/2022.  She had difficulty getting up secondary to generalized weakness.  Her son found her on the floor when he went to check up on her on 12/31/2022.  As result, she was brought to the emergency department for further evaluation and treatment.  Patient denies fevers, chills, headache, chest pain, dyspnea, nausea, vomiting, diarrhea, abdominal pain, dysuria, hematuria, hematochezia, and melena.  In the ED, the patient was afebrile and hemodynamically stable with oxygen saturation 89% on room air.  WBC 10.4, hemoglobin 11.8, platelets 414,000.  sodium 139, potassium 3.0, bicarbonate 31, serum creatinine 0.46.  AST 79, ALT 25, alk phosphatase 70, total bilirubin 0.7.  Magnesium 2.0.  Phosphorus 2.5. CPK 1004.  UA was negative for pyuria.  CT of the brain was negative for acute findings.  Chest x-ray was negative for any acute findings.  Orthopedics was consulted to assist with management.  PT evaluation was requested.   Assessment/Plan: Rhabdomyolysis -CPK 1004>> 1670 -Continue to trend CPK -Continue IV fluids  Gait instability/frequent falls/failure to thrive/underweight -T62 -Folic acid -TSH -PT evaluation -UA neg for pyuria -BMI  17.63  Left humerus fracture -Repeat x-ray left humerus -Orthopedics, Dr. Amedeo Kinsman consulted -received tramadol Rx from ED 12/27/22  Pancreatic IPMN -Patient follows at [facility] -MRI of the abdomen 11/26/2022 showed diffuse dilatation of the main pancreatic duct with multifocal side ductal ectasia which may represent main duct IPMN with sidebranch IPMN -Patient was deemed a poor surgical candidate secondary to poor functional status -Decision was made for continued imaging surveillance  Bipolar disorder -Patient follows Dr. Levonne Spiller -Continue Paxil, Xanax as needed, trazodone at bedtime -PDMP reviewed--alprazolam 0.5 mg, #120, refilled monthly, last rx 12/21/22  Hypokalemia -Replete -Magnesium 2.0  Hypoxia -Presented with oxygen saturation 89% room air -Likely secondary to undiagnosed COPD -Wean oxygen for saturation 92% -Personally reviewed chest x-ray--no infiltrates or edema      Family Communication: son updated 01/01/23  Consultants:  ortho--[place]  Code Status:  FULL  DVT Prophylaxis:  [place] Lovenox   Procedures: As Listed in Progress Note Above  Antibiotics: None       Subjective: Patient denies fevers, chills, headache, chest pain, dyspnea, nausea, vomiting, diarrhea, abdominal pain, dysuria, hematuria, hematochezia, and melena.   Objective: Vitals:   12/31/22 2230 01/01/23 0023 01/01/23 0126 01/01/23 0420  BP: (!) 172/63 (!) 182/64 (!) 153/64 (!) 159/62  Pulse: (!) 56 (!) 56  (!) 54  Resp: _0 Temp:  98.1 F (36.7 C)  98 F (36.7 C)  TempSrc:  Oral    SpO2: 92% 96%  94%  Weight:  46.6 kg    Height:  _1  (1.626 m)      Intake/Output Summary (Last  24 hours) at 01/01/2023 0858 Last data filed at 01/01/2023 0600 Gross per 24 hour  Intake 480 ml  Output 400 ml  Net 80 ml   Weight change:  Exam:  General:  Pt is alert, follows commands appropriately, not in acute distress HEENT: No icterus, No thrush, No neck mass,  [place]/AT Cardiovascular: RRR, S1/S2, no rubs, no gallops Respiratory: fine bibasilar crackles. No wheeze Abdomen: Soft/+BS, non tender, non distended, no guarding Extremities: No edema, No lymphangitis, No petechiae, No rashes, no synovitis   Data Reviewed: I have personally reviewed following labs and imaging studies Basic Metabolic Panel: Recent Labs  Lab 12/31/22 1723 01/01/23 0429  NA 142 139  K 4.1 3.0*  CL 101 100  CO2 32 31  GLUCOSE 138* 110*  BUN 11 8  CREATININE 0.46 0.46  CALCIUM 8.9 8.3*  MG  --  2.0  PHOS  --  2.5   Liver Function Tests: Recent Labs  Lab 12/31/22 1723 01/01/23 0429  AST 60* 79*  ALT 21 25  ALKPHOS 88 78  BILITOT 0.7 0.7  PROT 7.0 6.1*  ALBUMIN 3.7 3.2*   No results for input(s): "LIPASE", "AMYLASE" in the last 168 hours. No results for input(s): "AMMONIA" in the last 168 hours. Coagulation Profile: No results for input(s): "INR", "PROTIME" in the last 168 hours. CBC: Recent Labs  Lab 12/31/22 1723 01/01/23 0429  WBC 10.4 6.8  NEUTROABS 8.9*  --   HGB 11.8* 10.9*  HCT 37.3 34.5*  MCV 102.8* 101.2*  PLT 414* 425*   Cardiac Enzymes: Recent Labs  Lab 12/31/22 1723 01/01/23 0429  CKTOTAL 1,004* 1,670*   BNP: Invalid input(s): "POCBNP" CBG: No results for input(s): "GLUCAP" in the last 168 hours. HbA1C: No results for input(s): "HGBA1C" in the last 72 hours. Urine analysis:    Component Value Date/Time   COLORURINE YELLOW 12/31/2022 2221   APPEARANCEUR HAZY (A) 12/31/2022 2221   LABSPEC 1.017 12/31/2022 2221   PHURINE 6.0 12/31/2022 2221   GLUCOSEU NEGATIVE 12/31/2022 2221   HGBUR MODERATE (A) 12/31/2022 2221   BILIRUBINUR NEGATIVE 12/31/2022 2221   KETONESUR 20 (A) 12/31/2022 2221   PROTEINUR 100 (A) 12/31/2022 2221   NITRITE NEGATIVE 12/31/2022 2221   LEUKOCYTESUR NEGATIVE 12/31/2022 2221   Sepsis Labs: _0 (procalcitonin:4,lacticidven:4) )No results found for this or any previous visit (from the past 240  hour(s)).   Scheduled Meds:  cyanocobalamin  1,000 mcg Oral Daily   enoxaparin (LOVENOX) injection  40 mg Subcutaneous Q24H   feeding supplement  237 mL Oral BID BM   gabapentin  300 mg Oral BID   Pancrelipase (Lip-Prot-Amyl)  24,000 Units Oral Daily   pantoprazole  40 mg Oral Daily   PARoxetine  40 mg Oral BH-q7a   [START ON 01/02/2023] pneumococcal 20-valent conjugate vaccine  0.5 mL Intramuscular Tomorrow-1000   potassium chloride  20 mEq Oral Once   Vitamin D3  5,000 Units Oral Daily   Continuous Infusions:  0.9 % NaCl with KCl 20 mEq / L      Procedures/Studies: DG Humerus Left  Result Date: 01/01/2023 CLINICAL DATA:  242828 Proximal humerus fracture 250539 EXAM: LEFT HUMERUS - 2+ VIEW COMPARISON:  Radiograph 12/25/2022 FINDINGS: There is a mild displaced and comminuted left proximal humerus fracture through the surgical neck and involving the greater tuberosity. Alignment is not significantly changed from prior. No new fracture. IMPRESSION: Unchanged alignment of the mildly displaced and comminuted proximal humerus fracture. Electronically Signed   By: Ileene Patrick.D.  On: 01/01/2023 08:39   DG Chest Port 1 View  Result Date: 12/31/2022 CLINICAL DATA:  Cough. Multiple falls over last few weeks. Broke left shoulder 4 weeks ago. EXAM: PORTABLE CHEST 1 VIEW COMPARISON:  11/20/2016, 07/17/2021. FINDINGS: The heart size and mediastinal contours are within normal limits. There is atherosclerotic calcification of the aorta. No consolidation, effusion, or pneumothorax. There is bony deformity of the proximal left humerus, compatible with known fracture. There is a compression fracture at T11 which is unchanged from the previous exam. IMPRESSION: No active disease. Electronically Signed   By: Brett Fairy M.D.   On: 12/31/2022 22:44   CT Head Wo Contrast  Result Date: 12/31/2022 CLINICAL DATA:  Head trauma multiple fall EXAM: CT HEAD WITHOUT CONTRAST TECHNIQUE: Contiguous axial images were  obtained from the base of the skull through the vertex without intravenous contrast. RADIATION DOSE REDUCTION: This exam was performed according to the departmental dose-optimization program which includes automated exposure control, adjustment of the mA and/or kV according to patient size and/or use of iterative reconstruction technique. COMPARISON:  MRI 03/22/2021 FINDINGS: Brain: No acute territorial infarction, hemorrhage, or intracranial mass. Mild atrophy. Mild chronic small vessel ischemic changes of the white matter Vascular: No hyperdense vessels.  Carotid vascular calcification. Skull: Normal. Negative for fracture or focal lesion. Sinuses/Orbits: No acute finding. Other: None IMPRESSION: 1. No CT evidence for acute intracranial abnormality. 2. Atrophy and chronic small vessel ischemic changes of the white matter. Electronically Signed   By: Donavan Foil M.D.   On: 12/31/2022 20:50   DG Humerus Left  Result Date: 12/25/2022 CLINICAL DATA:  Fall. EXAM: LEFT HUMERUS - 2+ VIEW COMPARISON:  None Available. FINDINGS: Mildly displaced proximal left humeral neck fracture is noted. IMPRESSION: Mildly displaced proximal left humeral neck fracture. Electronically Signed   By: Marijo Conception M.D.   On: 12/25/2022 14:17    Orson Eva, DO  [facility]  If 7PM-7AM, please contact night-coverage [email or web address] Password [email or web address] 01/01/2023, 8:58 AM   LOS: 0 days

## 2023-01-01 NOTE — NC FL2 (Signed)
[place] LEVEL OF CARE FORM     IDENTIFICATION  Patient Name: Katherine Middleton Birthdate: 1951-01-06 Sex: female Admission Date (Current Location): 12/31/2022  [facility] and [place] Number:  [employer] and Address:  [place] [address] [place]      Provider Number: [id number]  Attending Physician Name and Address:  Orson Eva, MD  Relative Name and Phone Number:  Syesha, Thaw (Son) [phone]    Current Level of Care: Hospital Recommended Level of Care: [place] Prior Approval Number:    Date Approved/Denied:   PASRR Number: Pending  Discharge Plan: SNF    Current Diagnoses: Patient Active Problem List   Diagnosis Date Noted   Fall at home, initial encounter 01/01/2023   Failure to thrive in adult 01/01/2023   Underweight 01/01/2023   Closed displaced fracture of surgical neck of left humerus 01/01/2023   Generalized weakness 01/01/2023   Macrocytic anemia 01/01/2023   Pancreatic insufficiency 01/01/2023   Depression 01/01/2023   Tobacco abuse 01/01/2023   IPMN (intraductal papillary mucinous neoplasm) 01/01/2023   Rhabdomyolysis 12/31/2022   Chronic back pain 04/03/2013   Anxiety 12/17/2012   Vitamin D insufficiency 12/17/2012   GERD (gastroesophageal reflux disease) 11/18/2012   Bipolar 1 disorder ([place]) 11/18/2012   Fibromyalgia 11/18/2012    Orientation RESPIRATION BLADDER Height & Weight     Self, Time, Situation, Place  O2 (2L) External catheter Weight: 46.6 kg Height:  '5\' 4"'$  (162.6 cm)  BEHAVIORAL SYMPTOMS/MOOD NEUROLOGICAL BOWEL NUTRITION STATUS      Continent Diet (See DC Summary)  AMBULATORY STATUS COMMUNICATION OF NEEDS Skin   Extensive Assist Verbally Skin abrasions, Bruising                       Personal Care Assistance Level of Assistance  Bathing, Feeding, Dressing Bathing Assistance: Maximum assistance Feeding assistance: Limited assistance Dressing  Assistance: Maximum assistance     Functional Limitations Info  Sight, Hearing, Speech Sight Info: Impaired Hearing Info: Adequate Speech Info: Adequate    SPECIAL CARE FACTORS FREQUENCY  PT (By licensed PT)     PT Frequency: 5 times a week              Contractures Contractures Info: Not present    Additional Factors Info  Code Status, Allergies Code Status Info: FULL Allergies Info: Amoxicillin-pot Clavulanate  Cephalosporins  Codeine  Cymbalta (Duloxetine Hcl)  Duloxetine  Oxycodone  Penicillins  Baclofen  Povidone Iodine  Flonase (Fluticasone)  Propoxyphene  Sulfa Antibiotics  Sulfasalazine  Tramadol  Amitriptyline  Aspirin  Latex  Nsaids  Tolmetin           Current Medications (01/01/2023):  This is the current hospital active medication list Current Facility-Administered Medications  Medication Dose Route Frequency Provider Last Rate Last Admin   0.9 % NaCl with KCl 20 mEq/ L  infusion   Intravenous Continuous Tat, David, MD 100 mL/hr at 01/01/23 0943 New Bag at 01/01/23 0943   acetaminophen (TYLENOL) tablet 650 mg  650 mg Oral Q6H PRN Adefeso, Oladapo, DO   650 mg at 01/01/23 4665   Or   acetaminophen (TYLENOL) suppository 650 mg  650 mg Rectal Q6H PRN Adefeso, Oladapo, DO       ALPRAZolam Duanne Moron) tablet 0.5 mg  0.5 mg Oral QID PRN Adefeso, Oladapo, DO       alum & mag hydroxide-simeth (MAALOX/MYLANTA) 200-200-20 MG/5ML suspension 30 mL  30 mL Oral  Q4H PRN Orson Eva, MD   30 mL at 01/01/23 1314   cholecalciferol (VITAMIN D3) 25 MCG (1000 UNIT) tablet 5,000 Units  5,000 Units Oral Daily Adefeso, Oladapo, DO   5,000 Units at 01/01/23 0934   cyanocobalamin (VITAMIN B12) tablet 1,000 mcg  1,000 mcg Oral Daily Adefeso, Oladapo, DO   1,000 mcg at 01/01/23 0934   enoxaparin (LOVENOX) injection 40 mg  40 mg Subcutaneous Q24H Adefeso, Oladapo, DO   40 mg at 01/01/23 0934   feeding supplement (ENSURE ENLIVE / ENSURE PLUS) liquid 237 mL  237 mL Oral BID BM Adefeso, Oladapo, DO    237 mL at 01/01/23 0934   gabapentin (NEURONTIN) capsule 300 mg  300 mg Oral BID Tat, Shanon Brow, MD   300 mg at 01/01/23 0934   ipratropium-albuterol (DUONEB) 0.5-2.5 (3) MG/3ML nebulizer solution 3 mL  3 mL Nebulization Q4H PRN Tat, Shanon Brow, MD       lipase/protease/amylase (CREON) capsule 24,000 Units  24,000 Units Oral Daily Adefeso, Oladapo, DO   24,000 Units at 01/01/23 0934   ondansetron (ZOFRAN) tablet 4 mg  4 mg Oral Q6H PRN Adefeso, Oladapo, DO       Or   ondansetron (ZOFRAN) injection 4 mg  4 mg Intravenous Q6H PRN Adefeso, Oladapo, DO       Oral care mouth rinse  15 mL Mouth Rinse PRN Tat, Shanon Brow, MD       pantoprazole (PROTONIX) EC tablet 40 mg  40 mg Oral Daily Adefeso, Oladapo, DO   40 mg at 01/01/23 0934   PARoxetine (PAXIL) tablet 40 mg  40 mg Oral Daily Adefeso, Oladapo, DO   40 mg at 01/01/23 0934   [START ON 01/02/2023] pneumococcal 20-valent conjugate vaccine (PREVNAR 20) injection 0.5 mL  0.5 mL Intramuscular Tomorrow-1000 Adefeso, Oladapo, DO       tiZANidine (ZANAFLEX) tablet 4 mg  4 mg Oral Q8H PRN Bernadette Hoit, DO         Discharge Medications: Please see discharge summary for a list of discharge medications.  Relevant Imaging Results:  Relevant Lab Results:   Additional Information SS# [id number]  Boneta Lucks, RN

## 2023-01-01 NOTE — Evaluation (Signed)
Occupational Therapy Evaluation Patient Details Name: Katherine Middleton MRN: [id number] DOB: [birth year] Today's Date: 01/01/2023   History of Present Illness Katherine Middleton is a 72 y.o. female with medical history significant of GERD, chronic back pain, hyperlipidemia, bipolar disorder type I, CVA, current tobacco use, and pancreatic insufficiency status post pancreatic duct dilatation who presents to the emergency department after being brought in by her son.  Patient has had several falls within most recent fall being yesterday.  Patient lives alone with her dog, she states that she sustained a fall and I had difficulty in getting up, she was unsure how long she was on the floor.  Per ED medical record, son went to check on patient today and find her on the floor, was unsure how long she has been on the floor, but thinks it was approximately 8 hours.  She was confused when he saw her and thought she could have hit her head.  Patient's son was concerned that patient can no longer take care of herself at home, she was reported to have both stool and urinary incontinence in the ED.  Patient was seen in the ED on 12/26 due to left shoulder pain following a mechanical fall that occurred a week prior to presentation.  Left humerus x-ray taken at that time showed mildly displaced proximal left humeral neck fracture, she was provided with a sling, she was advised to take Tylenol as needed for pain and to follow-up with outpatient orthopedics.   Clinical Impression   Pt admitted for concerns listed above. PTA pt reported that she was independent at home, however she was getting assist with all IADL's. At this time, pt is severely limited in ROM, weakness, balance deficits, and activity tolerance. She is requiring mod A for bed mobility, and will require max A for all standing ADL's. OT recommending SNF level therapies to maximize independence and safety. OT will follow acutely.       Recommendations for follow  up therapy are one component of a multi-disciplinary discharge planning process, led by the attending physician.  Recommendations may be updated based on patient status, additional functional criteria and insurance authorization.   Follow Up Recommendations  Skilled nursing-short term rehab (<3 hours/day)     Assistance Recommended at Discharge Frequent or constant Supervision/Assistance  Patient can return home with the following A lot of help with walking and/or transfers;A lot of help with bathing/dressing/bathroom;Assistance with cooking/housework;Help with stairs or ramp for entrance    Functional Status Assessment  Patient has had a recent decline in their functional status and demonstrates the ability to make significant improvements in function in a reasonable and predictable amount of time.  Equipment Recommendations  None recommended by OT    Recommendations for Other Services       Precautions / Restrictions Precautions Precautions: Fall Precaution Comments: L shoulder fracture Required Braces or Orthoses: Sling Restrictions Weight Bearing Restrictions: Yes LUE Weight Bearing: Non weight bearing      Mobility Bed Mobility Overal bed mobility: Needs Assistance Bed Mobility: Supine to Sit, Sit to Supine     Supine to sit: Mod assist, HOB elevated Sit to supine: Mod assist   General bed mobility comments: assist to pull to seated with HOB elevated and assist for BLE back into bed    Transfers                   General transfer comment: Pt refused due to dizziness in sitting  Balance Overall balance assessment: Needs assistance Sitting-balance support: Feet supported, Single extremity supported Sitting balance-Leahy Scale: Fair Sitting balance - Comments: good/fair                                   ADL either performed or assessed with clinical judgement   ADL Overall ADL's : Needs assistance/impaired Eating/Feeding: Set  up;Sitting   Grooming: Set up;Sitting   Upper Body Bathing: Moderate assistance;Sitting   Lower Body Bathing: Maximal assistance;Sitting/lateral leans;Sit to/from stand   Upper Body Dressing : Moderate assistance;Sitting   Lower Body Dressing: Maximal assistance;Sit to/from stand;Sitting/lateral leans   Toilet Transfer: Maximal assistance;Stand-pivot   Toileting- Clothing Manipulation and Hygiene: Maximal assistance;Sit to/from stand;Sitting/lateral lean       Functional mobility during ADLs: Maximal assistance General ADL Comments: Pt limited by weakness and pain this session, as well as dizziness in sitting     Vision Baseline Vision/History: 1 Wears glasses Ability to See in Adequate Light: 0 Adequate Patient Visual Report: No change from baseline Vision Assessment?: No apparent visual deficits     Perception Perception Perception Tested?: No   Praxis Praxis Praxis tested?: Not tested    Pertinent Vitals/Pain Pain Assessment Pain Assessment: 0-10 Pain Score: 9  Pain Location: L shoulder Pain Descriptors / Indicators: Aching, Constant, Discomfort, Grimacing, Guarding Pain Intervention(s): Limited activity within patient's tolerance, Monitored during session, RN gave pain meds during session, Repositioned     Hand Dominance Right   Extremity/Trunk Assessment Upper Extremity Assessment Upper Extremity Assessment: LUE deficits/detail LUE Deficits / Details: Humerus fracture LUE: Shoulder pain at rest LUE Coordination: decreased fine motor;decreased gross motor   Lower Extremity Assessment Lower Extremity Assessment: Defer to PT evaluation   Cervical / Trunk Assessment Cervical / Trunk Assessment: Kyphotic   Communication Communication Communication: No difficulties   Cognition Arousal/Alertness: Awake/alert Behavior During Therapy: WFL for tasks assessed/performed Overall Cognitive Status: Within Functional Limits for tasks assessed                                        General Comments  VSS on RA, pt reporting dizziness in sitting    Exercises     Shoulder Instructions      Home Living Family/patient expects to be discharged to:: Private residence Living Arrangements: Alone Available Help at Discharge: Family Type of Home: House Home Access: Stairs to enter Technical brewer of Steps: 5-6 Entrance Stairs-Rails: Left Home Layout: One level     Bathroom Shower/Tub: Teacher, early years/pre: Standard Bathroom Accessibility: Yes How Accessible: Accessible via walker Home Equipment: Conservation officer, nature (2 wheels);Cane - single point;Grab bars - tub/shower;Shower seat          Prior Functioning/Environment Prior Level of Function : Independent/Modified Independent             Mobility Comments: patient states short distance community ambulation with SPC ADLs Comments: states independent        OT Problem List: Decreased strength;Decreased activity tolerance;Decreased range of motion;Impaired balance (sitting and/or standing);Decreased safety awareness;Decreased knowledge of use of DME or AE;Impaired UE functional use;Pain      OT Treatment/Interventions: Self-care/ADL training;Therapeutic exercise;Energy conservation;DME and/or AE instruction;Therapeutic activities;Patient/family education    OT Goals(Current goals can be found in the care plan section) Acute Rehab OT Goals Patient Stated Goal: To lessen pain OT Goal  Formulation: With patient Time For Goal Achievement: 01/15/23 Potential to Achieve Goals: Good ADL Goals Pt Will Perform Grooming: with modified independence;standing Pt Will Perform Lower Body Bathing: sitting/lateral leans;sit to/from stand;with min assist;with adaptive equipment Pt Will Perform Lower Body Dressing: sitting/lateral leans;sit to/from stand;with min assist;with adaptive equipment Pt Will Transfer to Toilet: with mod assist;stand pivot transfer Pt Will  Perform Toileting - Clothing Manipulation and hygiene: with min assist;sitting/lateral leans;sit to/from stand  OT Frequency: Min 2X/week    Co-evaluation              AM-PAC OT "6 Clicks" Daily Activity     Outcome Measure Help from another person eating meals?: A Little Help from another person taking care of personal grooming?: A Little Help from another person toileting, which includes using toliet, bedpan, or urinal?: A Lot Help from another person bathing (including washing, rinsing, drying)?: A Lot Help from another person to put on and taking off regular upper body clothing?: A Lot Help from another person to put on and taking off regular lower body clothing?: A Lot 6 Click Score: 14   End of Session Equipment Utilized During Treatment: Oxygen Nurse Communication: Mobility status  Activity Tolerance: Patient limited by pain Patient left: in bed;with call bell/phone within reach;with bed alarm set  OT Visit Diagnosis: Unsteadiness on feet (R26.81);Other abnormalities of gait and mobility (R26.89);Muscle weakness (generalized) (M62.81);Pain Pain - Right/Left: Left Pain - part of body: Shoulder                Time: 1610-9604 OT Time Calculation (min): 24 min Charges:  OT General Charges $OT Visit: 1 Visit OT Evaluation $OT Eval Moderate Complexity: 1 Mod OT Treatments $Self Care/Home Management : 8-22 mins  Paulita Fujita, OTR/L [place] 01/01/2023, 5:27 PM

## 2023-01-01 NOTE — Progress Notes (Signed)
Initial Nutrition Assessment  DOCUMENTATION CODES:   Underweight  INTERVENTION:   -Continue Ensure Enlive po BID, each supplement provides 350 kcal and 20 grams of protein -MVI with minerals daily -Magic cup TID with meals, each supplement provides 290 kcal and 9 grams of protein  -Continue with regular diet for widest variety of meal selections  NUTRITION DIAGNOSIS:   Increased nutrient needs related to chronic illness (pancreatic IPMN) as evidenced by estimated needs.  GOAL:   Patient will meet greater than or equal to 90% of their needs  MONITOR:   PO intake, Supplement acceptance  REASON FOR ASSESSMENT:   Consult Assessment of nutrition requirement/status  ASSESSMENT:   Pt with a history of chronic back pain, GERD, bipolar disorder, hyperlipidemia, stroke, tobacco abuse (30+ pack years), pancreatic IPMN presenting with a fall.  Pt admitted with rhabdomyolysis and lt humerus fracture.   Reviewed I/O's: +80 ml x 24 hours   UOP: 400 ml x 24 hours  Pt unavailable at time of visit. Attempted to speak with pt via call to hospital room phone, however, unable to reach. RD unable to obtain further nutrition-related history or complete nutrition-focused physical exam at this time.    Per MD notes, plan to continue with sling for humerus fracture. Pt has had multiple falls at home PTA.   Pt currently on a regular diet. Noted meal completions 50%. Ensure supplements have been ordered. Pt with variable acceptance of supplements.   Reviewed wt hx; no wt loss noted.   Given frailty and underweight status, highly suspect pt with malnutrition, however, unable to identify at this time. Pt would greatly benefit from addition of oral nutrition supplements.   Medications reviewed and include vitamin D3, vitamin B-12, creon, and 0.9% NaCl with KCl 20 mEq/L infusion @ 100 ml/hr.   Per TOC notes, plan to d/c to SNF once medically stable. PASRR pending.   Labs reviewed: Na: 128, K:  3, CBGS: 128 (inpatient orders for glycemic control are none).    Diet Order:   Diet Order             Diet regular Room service appropriate? Yes; Fluid consistency: Thin  Diet effective now                   EDUCATION NEEDS:   No education needs have been identified at this time  Skin:  Skin Assessment: Reviewed RN Assessment  Last BM:  01/01/23  Height:   Ht Readings from Last 1 Encounters:  01/01/23 '5\' 4"'$  (1.626 m)    Weight:   Wt Readings from Last 1 Encounters:  01/01/23 46.6 kg    Ideal Body Weight:  54.5 kg  BMI:  Body mass index is 17.63 kg/m.  Estimated Nutritional Needs:   Kcal:  1850-2050  Protein:  90-105 grams  Fluid:  > 1.8 L    Loistine Chance, RD, LDN, [place] Registered Dietitian II Certified Diabetes Care and Education Specialist Please refer to [facility] for RD and/or RD on-call/weekend/after hours pager

## 2023-01-01 NOTE — Consult Note (Signed)
ORTHOPAEDIC CONSULTATION  REQUESTING PHYSICIAN: Orson Eva, MD  ASSESSMENT AND PLAN: 72 y.o. female with the following: Left proximal humerus fracture  Katherine Middleton sustained a proximal left humerus fracture, minimally displaced.  Based on available imaging, I think this can be treated without surgery.  She is to remain in the sling at all times.  Will plan to see her back for repeat evaluation in clinic in approximately 2 weeks.  If she has issues before then, I have asked her and her family to return to clinic at an earlier time.  All this was discussed with the patient's son, and all questions were answered.  - Weight Bearing Status/Activity: Nonweightbearing, in a sling  - Additional recommended labs/tests: None  -VTE Prophylaxis: As needed  - Pain control: P.o. pain medications, as needed  - Follow-up plan: 1-2 weeks  -Procedures: None  Chief Complaint: Left shoulder pain  HPI: Katherine Middleton is a 72 y.o. LHD female with past medical history as indicated below.  She was recently admitted to the hospital, after sustaining multiple falls.  First fall was over a week ago.  She was seen in the emergency department.  She was diagnosed with a left proximal humerus fracture.  She returned home, and subsequently fell again.  This time, she was found down.  She was admitted for medical issues.  She is in a sling.  No pain elsewhere.  She has no numbness or tingling.  Past Medical History:  Diagnosis Date   Anxiety    Arthritis    Bipolar disorder ([place])    Depression    Fibromyalgia    GERD (gastroesophageal reflux disease) 12/31/2002   HLD (hyperlipidemia)    diet controlled - no meds   HSV infection    Liver mass 07/2021   Osteoarthritis    Prolonged Q-T interval on ECG 02/2021   Sinusitis 12/31/2012   Wears glasses    Past Surgical History:  Procedure Laterality Date   BIOPSY  08/24/2021   Procedure: BIOPSY;  Surgeon: Irving Copas., MD;  Location: [place];  Service: Gastroenterology;;   BIOPSY  09/11/2021   Procedure: BIOPSY;  Surgeon: Irving Copas., MD;  Location: [facility];  Service: Gastroenterology;;   ESOPHAGOGASTRODUODENOSCOPY (EGD) WITH PROPOFOL N/A 08/11/2021   Procedure: ESOPHAGOGASTRODUODENOSCOPY (EGD) WITH PROPOFOL;  Surgeon: Carol Ada, MD;  Location: [facility];  Service: Endoscopy;  Laterality: N/A;   ESOPHAGOGASTRODUODENOSCOPY (EGD) WITH PROPOFOL N/A 08/24/2021   Procedure: ESOPHAGOGASTRODUODENOSCOPY (EGD) WITH PROPOFOL;  Surgeon: Rush Landmark Telford Nab., MD;  Location: [place];  Service: Gastroenterology;  Laterality: N/A;   ESOPHAGOGASTRODUODENOSCOPY (EGD) WITH PROPOFOL N/A 09/11/2021   Procedure: ESOPHAGOGASTRODUODENOSCOPY (EGD) WITH PROPOFOL;  Surgeon: Rush Landmark Telford Nab., MD;  [facility];  Service: Gastroenterology;  Laterality: N/A;   EUS N/A 08/24/2021   Procedure: UPPER ENDOSCOPIC ULTRASOUND (EUS) LINEAR;  Surgeon: Irving Copas., MD;  Location: [place];  Service: Gastroenterology;  Laterality: N/A;   EUS N/A 09/11/2021   Procedure: UPPER ENDOSCOPIC ULTRASOUND (EUS) LINEAR;  Surgeon: Irving Copas., MD;  [facility];  Service: Gastroenterology;  Laterality: N/A;   FINE NEEDLE ASPIRATION  08/24/2021   Procedure: FINE NEEDLE ASPIRATION;  Surgeon: Rush Landmark Telford Nab., MD;  Location: [place];  Service: Gastroenterology;;   FINE NEEDLE ASPIRATION  09/11/2021   Procedure: FINE NEEDLE ASPIRATION (FNA) LINEAR;  Surgeon: Irving Copas., MD;  Location[facility];  Service: Gastroenterology;;  Delaine Lame. not in system to charge   TONSILLECTOMY  04/08/57  some date around then   TUBAL LIGATION  10/21/1984   UPPER ESOPHAGEAL ENDOSCOPIC ULTRASOUND (EUS) N/A 08/11/2021   Procedure: UPPER ESOPHAGEAL ENDOSCOPIC ULTRASOUND (EUS);  Surgeon: Carol Ada, MD;  Location: [facility];  Service: Endoscopy;  Laterality: N/A;   Social History    Socioeconomic History   Marital status: Married    Spouse name: Not on file   Number of children: Not on file   Years of education: Not on file   Highest education level: Not on file  Occupational History   Not on file  Tobacco Use   Smoking status: Every Day    Packs/day: 0.50    Years: 30.00    Total pack years: 15.00    Types: Cigarettes   Smokeless tobacco: Never   Tobacco comments:    11-15 cigs a day as of 05/01/2013  Vaping Use   Vaping Use: Never used  Substance and Sexual Activity   Alcohol use: Yes    Comment: pts son reports several drinks throughout the week   Drug use: No   Sexual activity: Yes    Partners: Male    Birth control/protection: Post-menopausal  Other Topics Concern   Not on file  Social History Narrative   Left handed    Lives alone    Social Determinants of Health   Financial Resource Strain: Not on file  Food Insecurity: No Food Insecurity (01/01/2023)   Hunger Vital Sign    Worried About Running Out of Food in the Last Year: Never true    Ran Out of Food in the Last Year: Never true  Transportation Needs: No Transportation Needs (01/01/2023)   PRAPARE - Hydrologist (Medical): No    Lack of Transportation (Non-Medical): No  Physical Activity: Not on file  Stress: Not on file  Social Connections: Not on file   Family History  Problem Relation Age of Onset   Bipolar disorder Sister    Anxiety disorder Sister    Dementia Sister 17   Paranoid behavior Sister    Bipolar disorder Father    Schizophrenia Father    Alcohol abuse Maternal Uncle    Alcohol abuse Paternal Uncle    Anxiety disorder Mother    ADD / ADHD Other    ADD / ADHD Other    Healthy Other    OCD Neg Hx    Seizures Neg Hx    Sexual abuse Neg Hx    Physical abuse Neg Hx    Allergies  Allergen Reactions   Amoxicillin-Pot Clavulanate Anaphylaxis and Swelling   Cephalosporins Anaphylaxis    Other reaction(s): Unknown   Codeine Itching    Cymbalta [Duloxetine Hcl] Other (See Comments)    GERD   Duloxetine     Other reaction(s): Other (See Comments), Unknown GERD    Oxycodone Itching and Other (See Comments)    "OUT THERE", climbing the walls   Penicillins Anaphylaxis and Swelling    Throat swelling Reaction: 8-10 years ago   Baclofen Other (See Comments)    Anxiety got much worse and possibly ppted manic episode Other reaction(s): Unknown   Povidone Iodine Itching and Other (See Comments)    Particularly if in a douche  Other reaction(s): Unknown   Flonase [Fluticasone]     Makes eye pressure to increase   Propoxyphene Itching    Anxiety/jittery    Sulfa Antibiotics Swelling   Sulfasalazine Swelling    Other reaction(s): Unknown   Tramadol  Other reaction(s): Other (See Comments), Unknown Patient states that this makes her nervous    Amitriptyline Other (See Comments)    Caused bowels to empty quickly and couldn't sleep on it. Other reaction(s): Other (See Comments), Unknown Caused bowels to empty quickly and couldn't sleep on it. Caused bowels to empty quickly and couldn't sleep on it. Caused bowels to empty quickly and couldn't sleep on it.    Aspirin Hives and Rash   Latex Swelling and Other (See Comments)    If in the mouth, it swells   Nsaids Nausea Only and Other (See Comments)    Almost passing out, bad acid reflux Other reaction(s): Other (See Comments) other   Tolmetin Nausea Only    Other reaction(s): Other (See Comments), Unknown Almost passing out, bad acid reflux Almost passing out, bad acid reflux    Prior to Admission medications   Medication Sig Start Date End Date Taking? Authorizing Provider  acetaminophen (TYLENOL) 650 MG CR tablet Take 650 mg by mouth daily as needed for pain.   Yes [provider]  ALPRAZolam (XANAX) 0.5 MG tablet Take 1 tablet (0.5 mg total) by mouth 4 (four) times daily as needed. 10/24/22  Yes Cloria Spring, MD  cetirizine (ZYRTEC) 10 MG  tablet Take 10 mg by mouth at bedtime.   Yes [provider]  Cholecalciferol (VITAMIN D3) 125 MCG (5000 UT) TABS Take 5,000 Units by mouth daily.   Yes [provider]  gabapentin (NEURONTIN) 300 MG capsule Take 2 capsules (600 mg total) by mouth 2 (two) times daily. 10/24/22  Yes Cloria Spring, MD  omeprazole (PRILOSEC) 20 MG capsule Take 20 mg by mouth daily.  09/04/13  Yes [provider]  Pancrelipase, Lip-Prot-Amyl, (CREON) 24000-76000 units CPEP Take 2 capsules with each meal, 1 after starting/1 when finished eating, and 1 capsule with snack after starting to eat. 12/07/21  Yes [provider]  PARoxetine (PAXIL) 40 MG tablet Take 1 tablet (40 mg total) by mouth every morning. 10/24/22  Yes Cloria Spring, MD  Polyethyl Glyc-Propyl Glyc PF (SYSTANE ULTRA PF) 0.4-0.3 % SOLN Place 1 drop into both eyes daily as needed (Dry eye).   Yes [provider]  sodium chloride (OCEAN) 0.65 % SOLN nasal spray Place 1 spray into both nostrils as needed for congestion.   Yes [provider]  tiZANidine (ZANAFLEX) 4 MG tablet Take 4 mg by mouth every 8 (eight) hours as needed (muscle pain).   Yes [provider]  traZODone (DESYREL) 150 MG tablet Take 1 tablet (150 mg total) by mouth at bedtime. 10/24/22  Yes Cloria Spring, MD  vitamin B-12 (CYANOCOBALAMIN) 1000 MCG tablet Take 1,000 mcg by mouth daily.   Yes [provider]   DG Humerus Left  Result Date: 01/01/2023 CLINICAL DATA:  242828 Proximal humerus fracture 161096 EXAM: LEFT HUMERUS - 2+ VIEW COMPARISON:  Radiograph 12/25/2022 FINDINGS: There is a mild displaced and comminuted left proximal humerus fracture through the surgical neck and involving the greater tuberosity. Alignment is not significantly changed from prior. No new fracture. IMPRESSION: Unchanged alignment of the mildly displaced and comminuted proximal humerus fracture. Electronically Signed   By: Maurine Simmering M.D.    On: 01/01/2023 08:39   DG Chest Port 1 View  Result Date: 12/31/2022 CLINICAL DATA:  Cough. Multiple falls over last few weeks. Broke left shoulder 4 weeks ago. EXAM: PORTABLE CHEST 1 VIEW COMPARISON:  11/20/2016, 07/17/2021. FINDINGS: The heart size and mediastinal  contours are within normal limits. There is atherosclerotic calcification of the aorta. No consolidation, effusion, or pneumothorax. There is bony deformity of the proximal left humerus, compatible with known fracture. There is a compression fracture at T11 which is unchanged from the previous exam. IMPRESSION: No active disease. Electronically Signed   By: Brett Fairy M.D.   On: 12/31/2022 22:44   CT Head Wo Contrast  Result Date: 12/31/2022 CLINICAL DATA:  Head trauma multiple fall EXAM: CT HEAD WITHOUT CONTRAST TECHNIQUE: Contiguous axial images were obtained from the base of the skull through the vertex without intravenous contrast. RADIATION DOSE REDUCTION: This exam was performed according to the departmental dose-optimization program which includes automated exposure control, adjustment of the mA and/or kV according to patient size and/or use of iterative reconstruction technique. COMPARISON:  MRI 03/22/2021 FINDINGS: Brain: No acute territorial infarction, hemorrhage, or intracranial mass. Mild atrophy. Mild chronic small vessel ischemic changes of the white matter Vascular: No hyperdense vessels.  Carotid vascular calcification. Skull: Normal. Negative for fracture or focal lesion. Sinuses/Orbits: No acute finding. Other: None IMPRESSION: 1. No CT evidence for acute intracranial abnormality. 2. Atrophy and chronic small vessel ischemic changes of the white matter. Electronically Signed   By: Donavan Foil M.D.   On: 12/31/2022 20:50    Family History Reviewed and non-contributory, no pertinent history of problems with bleeding or anesthesia    Review of Systems No fevers or chills No numbness or tingling No chest pain No  shortness of breath No bowel or bladder dysfunction No GI distress No headaches    OBJECTIVE  Vitals:Patient Vitals for the past 8 hrs:  BP Temp Temp src Pulse SpO2  01/01/23 1300 (!) 169/72 98.3 F (36.8 C) Oral 60 95 %   General: Alert, no acute distress Cardiovascular: Warm extremities noted Respiratory: No cyanosis, no use of accessory musculature GI: No organomegaly, abdomen is soft and non-tender Skin: No lesions in the area of chief complaint other than those listed below in MSK exam.  Neurologic: Sensation intact distally save for the below mentioned MSK exam Psychiatric: Patient is competent for consent with normal mood and affect Lymphatic: No swelling obvious and reported other than the area involved in the exam below Extremities   LUE: Evaluation of left upper extremity demonstrates no obvious deformity.  There is healing bruising, throughout the left arm.  Residual swelling in the arm.  Tenderness to palpation of the proximal humerus.  Fingers are warm and well-perfused.  Active motion intact throughout the left hand.  Sensation is intact throughout the left hand.  2+ DP pulse.    Test Results Imaging  Left shoulder x-ray: Unchanged alignment of the mildly displaced and comminuted proximal left humerus fracture  Labs cbc Recent Labs    12/31/22 1723 01/01/23 0429  WBC 10.4 6.8  HGB 11.8* 10.9*  HCT 37.3 34.5*  PLT 414* 425*      Recent Labs    12/31/22 1723 01/01/23 0429  NA 142 139  K 4.1 3.0*  CL 101 100  CO2 32 31  GLUCOSE 138* 110*  BUN 11 8  CREATININE 0.46 0.46  CALCIUM 8.9 8.3*

## 2023-01-01 NOTE — Progress Notes (Signed)
Blood pressure 182/64, HR 55. secure chat sent to Dr. Pierce Crane.

## 2023-01-01 NOTE — Progress Notes (Signed)
Patient admitted to floor from ER. Patient alert and oriented x4. Patient has purse/wallet at bedside with personal belongings and requested to keep at bedside. Purse placed inside patient belongings bag, valuable policy explained to patient and she verbalized understanding. Patient declined to have purse locked up. Home medication sent to pharmacy by [facility] and tab placed in chart from medication bag. Call bell within reach, fall risk bracelet placed on patient, bed in lowest position and fall mat placed in floor at bedside.

## 2023-01-01 NOTE — Plan of Care (Signed)
  Problem: Acute Rehab PT Goals(only PT should resolve) Goal: Pt Will Go Supine/Side To Sit Outcome: Progressing Flowsheets (Taken 01/01/2023 1019) Pt will go Supine/Side to Sit:  with min guard assist  with minimal assist Goal: Pt Will Go Sit To Supine/Side Outcome: Progressing Flowsheets (Taken 01/01/2023 1019) Pt will go Sit to Supine/Side:  with min guard assist  with minimal assist Goal: Patient Will Transfer Sit To/From Stand Outcome: Progressing Flowsheets (Taken 01/01/2023 1019) Patient will transfer sit to/from stand:  with min guard assist  with minimal assist Goal: Pt Will Transfer Bed To Chair/Chair To Bed Outcome: Progressing Flowsheets (Taken 01/01/2023 1019) Pt will Transfer Bed to Chair/Chair to Bed:  with min assist  min guard assist Goal: Pt Will Ambulate Outcome: Progressing Flowsheets (Taken 01/01/2023 1019) Pt will Ambulate:  25 feet  with minimal assist  with least restrictive assistive device Goal: Pt/caregiver will Perform Home Exercise Program Outcome: Progressing Flowsheets (Taken 01/01/2023 1019) Pt/caregiver will Perform Home Exercise Program:  For increased strengthening  For improved balance  Independently  10:19 AM, 01/01/23 Mearl Latin PT, DPT Physical Therapist at [facility]

## 2023-01-01 NOTE — TOC Progression Note (Signed)
  30 Day Note  Patient Details  Name: Katherine Middleton MRN: [id number] Date of Birth: 10/21/1951  [facility]) CM/SW Contact  Boneta Lucks, RN Phone Number: [phone] 01/01/2023, 2:25 PM     To whom it May Concern: Please be advised that the above name patient will require a short-term nursing home stay- anticipated 30 days or less rehabilitation and strengthening. The plan is for return home.

## 2023-01-01 NOTE — TOC Initial Note (Signed)
Transition of Care [facility]) - Initial/Assessment Note    Patient Details  Name: Katherine Middleton MRN: [id number] Date of Birth: 1951/11/23  [facility]) CM/SW Contact:    Boneta Lucks, RN Phone Number: 01/01/2023, 2:20 PM  Clinical Narrative:   Patient admitted with Rhabdomyolysis. PT is recommending SNF. CM received message from RN to call Son and daughter in law. Per Daughter in law, patient lives alone, she has been weak, need SNF to get stronger for future surgery needed.  First choice is [facility] or [facility]. FL2 completed and sent out for bed offers. PASRR is pending, documents uploaded as requested. TOC to follow.                 Expected Discharge Plan: Skilled Nursing Facility Barriers to Discharge: Continued Medical Work up   Patient Goals and CMS Choice Patient states their goals for this hospitalization and ongoing recovery are:: agreeable to SNF CMS Medicare.gov Compare Post Acute Care list provided to:: Patient Represenative (must comment) Choice offered to / list presented to : Adult Children     Expected Discharge Plan and Services      Living arrangements for the past 2 months: Single Family Home      Prior Living Arrangements/Services Living arrangements for the past 2 months: [place] with:: Self Patient language and need for interpreter reviewed:: Yes        Need for Family Participation in Patient Care: Yes (Comment) Care giver support system in place?: Yes (comment)   Criminal Activity/Legal Involvement Pertinent to Current Situation/Hospitalization: No - Comment as needed  Activities of Daily Living Home Assistive Devices/Equipment: Cane (specify quad or straight), Eyeglasses, Scales, Shower chair with back, Sling (specify type), Walker (specify type) ADL Screening (condition at time of admission) Patient's cognitive ability adequate to safely complete daily activities?: Yes Is the patient deaf or have difficulty hearing?:  No Does the patient have difficulty seeing, even when wearing glasses/contacts?: No Does the patient have difficulty concentrating, remembering, or making decisions?: No Patient able to express need for assistance with ADLs?: Yes Does the patient have difficulty dressing or bathing?: Yes Independently performs ADLs?: No Communication: Independent Dressing (OT): Needs assistance Is this a change from baseline?: Change from baseline, expected to last <3days Grooming: Needs assistance Is this a change from baseline?: Change from baseline, expected to last <3 days Feeding: Independent Bathing: Needs assistance Is this a change from baseline?: Change from baseline, expected to last <3 days Toileting: Needs assistance Is this a change from baseline?: Change from baseline, expected to last <3 days In/Out Bed: Needs assistance Is this a change from baseline?: Change from baseline, expected to last <3 days Walks in Home: Independent with device (comment) (walker) Does the patient have difficulty walking or climbing stairs?: Yes Weakness of Legs: Both Weakness of Arms/Hands: Both  Permission Sought/Granted      Emotional Assessment      Orientation: : Oriented to Self, Oriented to Place, Oriented to  Time, Oriented to Situation Alcohol / Substance Use: Not Applicable Psych Involvement: No (comment)  Admission diagnosis:  Rhabdomyolysis [M62.82] Weakness [R53.1] Frequent falls [R29.6] Patient Active Problem List   Diagnosis Date Noted   Fall at home, initial encounter 01/01/2023   Failure to thrive in adult 01/01/2023   Underweight 01/01/2023   Closed displaced fracture of surgical neck of left humerus 01/01/2023   Generalized weakness 01/01/2023   Macrocytic anemia 01/01/2023   Pancreatic insufficiency 01/01/2023   Depression 01/01/2023  Tobacco abuse 01/01/2023   IPMN (intraductal papillary mucinous neoplasm) 01/01/2023   Rhabdomyolysis 12/31/2022   Chronic back pain  04/03/2013   Anxiety 12/17/2012   Vitamin D insufficiency 12/17/2012   GERD (gastroesophageal reflux disease) 11/18/2012   Bipolar 1 disorder ([place]) 11/18/2012   Fibromyalgia 11/18/2012   PCP:  Aletha Halim., PA-C Pharmacy:   [facility] [place] [address] [place] [address] Phone: [phone] Fax: [phone]     Social Determinants of Health (SDOH) Social History: [place]: No Food Insecurity (01/01/2023)  Housing: Low Risk  (01/01/2023)  Transportation Needs: No Transportation Needs (01/01/2023)  Utilities: Not At Risk (01/01/2023)  Depression (PHQ2-9): Low Risk  (04/19/2022)  Tobacco Use: High Risk (12/31/2022)   SDOH Interventions: Housing Interventions: Inpatient TOC  Readmission Risk Interventions    01/01/2023    2:18 PM  Readmission Risk Prevention Plan  Post Dischage Appt Not Complete  Medication Screening Complete  Transportation Screening Complete

## 2023-01-01 NOTE — Evaluation (Signed)
Physical Therapy Evaluation Patient Details Name: Katherine Middleton MRN: [id number] DOB: [birth year] Today's Date: 01/01/2023  History of Present Illness  Katherine Middleton is a 72 y.o. female with medical history significant of GERD, chronic back pain, hyperlipidemia, bipolar disorder type I, CVA, current tobacco use, and pancreatic insufficiency status post pancreatic duct dilatation who presents to the emergency department after being brought in by her son.  Patient has had several falls within most recent fall being yesterday.  Patient lives alone with her dog, she states that she sustained a fall and I had difficulty in getting up, she was unsure how long she was on the floor.  Per ED medical record, son went to check on patient today and find her on the floor, was unsure how long she has been on the floor, but thinks it was approximately 8 hours.  She was confused when he saw her and thought she could have hit her head.  Patient's son was concerned that patient can no longer take care of herself at home, she was reported to have both stool and urinary incontinence in the ED.  Patient was seen in the ED on 12/26 due to left shoulder pain following a mechanical fall that occurred a week prior to presentation.  Left humerus x-ray taken at that time showed mildly displaced proximal left humeral neck fracture, she was provided with a sling, she was advised to take Tylenol as needed for pain and to follow-up with outpatient orthopedics.   Clinical Impression  Patient limited for functional mobility as stated below secondary to BLE weakness, fatigue and impaired balance. Patient requires assist to pull to seated EOB with HOB elevated with use of RUE only as L arm in sling with humeral fracture. She demonstrates fair sitting balance requiring frequent RUE use for balance. Patient apprehensive for further mobility due to c/o L shoulder pain and fear of falling. Patient will benefit from continued physical therapy  in hospital and recommended venue below to increase strength, balance, endurance for safe ADLs and gait.        Recommendations for follow up therapy are one component of a multi-disciplinary discharge planning process, led by the attending physician.  Recommendations may be updated based on patient status, additional functional criteria and insurance authorization.  Follow Up Recommendations Skilled nursing-short term rehab (<3 hours/day) Can patient physically be transported by private vehicle: Yes    Assistance Recommended at Discharge Intermittent Supervision/Assistance  Patient can return home with the following  A little help with walking and/or transfers;A little help with bathing/dressing/bathroom    Equipment Recommendations None recommended by PT  Recommendations for Other Services       Functional Status Assessment Patient has had a recent decline in their functional status and demonstrates the ability to make significant improvements in function in a reasonable and predictable amount of time.     Precautions / Restrictions Precautions Precautions: Fall Precaution Comments: L shoulder fracture Required Braces or Orthoses: Sling Restrictions Weight Bearing Restrictions: No      Mobility  Bed Mobility Overal bed mobility: Needs Assistance Bed Mobility: Supine to Sit, Sit to Supine     Supine to sit: Mod assist, HOB elevated Sit to supine: Mod assist   General bed mobility comments: assist to pull to seated with HOB elevated and assist for BLE back into bed    Transfers  Ambulation/Gait                  Stairs            Wheelchair Mobility    Modified Rankin (Stroke Patients Only)       Balance Overall balance assessment: Needs assistance Sitting-balance support: Feet supported, Single extremity supported Sitting balance-Leahy Scale: Fair Sitting balance - Comments: good/fair                                      Pertinent Vitals/Pain Pain Assessment Pain Assessment: Faces Faces Pain Scale: Hurts even more Pain Location: L shoulder Pain Descriptors / Indicators: Grimacing, Guarding Pain Intervention(s): Limited activity within patient's tolerance, Monitored during session, Repositioned    Home Living Family/patient expects to be discharged to:: Private residence Living Arrangements: Alone Available Help at Discharge: Family Type of Home: House Home Access: Stairs to enter Entrance Stairs-Rails: Left Entrance Stairs-Number of Steps: 5-6   Home Layout: One level Home Equipment: Conservation officer, nature (2 wheels);Cane - single point;Grab bars - tub/shower;Shower seat      Prior Function Prior Level of Function : Independent/Modified Independent             Mobility Comments: patient states short distance community ambulation with SPC ADLs Comments: states independent     Hand Dominance        Extremity/Trunk Assessment   Upper Extremity Assessment Upper Extremity Assessment: Defer to OT evaluation    Lower Extremity Assessment Lower Extremity Assessment: Generalized weakness    Cervical / Trunk Assessment Cervical / Trunk Assessment: Kyphotic  Communication   Communication: No difficulties  Cognition Arousal/Alertness: Awake/alert Behavior During Therapy: WFL for tasks assessed/performed Overall Cognitive Status: Within Functional Limits for tasks assessed                                          General Comments      Exercises     Assessment/Plan    PT Assessment Patient needs continued PT services  PT Problem List Decreased strength;Decreased mobility;Decreased activity tolerance;Decreased balance;Pain       PT Treatment Interventions DME instruction;Therapeutic exercise;Gait training;Balance training;Stair training;Neuromuscular re-education;Functional mobility training;Therapeutic activities;Patient/family education     PT Goals (Current goals can be found in the Care Plan section)  Acute Rehab PT Goals Patient Stated Goal: return home PT Goal Formulation: With patient Time For Goal Achievement: 01/15/23 Potential to Achieve Goals: Fair    Frequency Min 3X/week     Co-evaluation               AM-PAC PT "6 Clicks" Mobility  Outcome Measure Help needed turning from your back to your side while in a flat bed without using bedrails?: A Little Help needed moving from lying on your back to sitting on the side of a flat bed without using bedrails?: A Lot Help needed moving to and from a bed to a chair (including a wheelchair)?: A Lot Help needed standing up from a chair using your arms (e.g., wheelchair or bedside chair)?: A Lot Help needed to walk in hospital room?: A Lot Help needed climbing 3-5 steps with a railing? : A Lot 6 Click Score: 13    End of Session Equipment Utilized During Treatment: Oxygen Activity Tolerance: Patient tolerated treatment well;Patient limited by fatigue;Patient limited  by pain Patient left: in bed;with call bell/phone within reach;with bed alarm set Nurse Communication: Mobility status PT Visit Diagnosis: Unsteadiness on feet (R26.81);Other abnormalities of gait and mobility (R26.89);Muscle weakness (generalized) (M62.81)    Time: 7322-5672 PT Time Calculation (min) (ACUTE ONLY): 16 min   Charges:   PT Evaluation $PT Eval Low Complexity: 1 Low PT Treatments $Therapeutic Activity: 8-22 mins        10:18 AM, 01/01/23 Mearl Latin PT, DPT Physical Therapist at [facility]

## 2023-01-02 DIAGNOSIS — K8689 Other specified diseases of pancreas: Secondary | ICD-10-CM | POA: Diagnosis not present

## 2023-01-02 DIAGNOSIS — S42212D Unspecified displaced fracture of surgical neck of left humerus, subsequent encounter for fracture with routine healing: Secondary | ICD-10-CM | POA: Diagnosis not present

## 2023-01-02 DIAGNOSIS — R627 Adult failure to thrive: Secondary | ICD-10-CM | POA: Diagnosis not present

## 2023-01-02 DIAGNOSIS — M6282 Rhabdomyolysis: Secondary | ICD-10-CM | POA: Diagnosis not present

## 2023-01-02 DIAGNOSIS — D490 Neoplasm of unspecified behavior of digestive system: Secondary | ICD-10-CM

## 2023-01-02 LAB — BASIC METABOLIC PANEL
Anion gap: 6 (ref 5–15)
BUN: 7 mg/dL — ABNORMAL LOW (ref 8–23)
CO2: 29 mmol/L (ref 22–32)
Calcium: 7.8 mg/dL — ABNORMAL LOW (ref 8.9–10.3)
Chloride: 104 mmol/L (ref 98–111)
Creatinine, Ser: <0.3 mg/dL — ABNORMAL LOW (ref 0.44–1.00)
Glucose, Bld: 98 mg/dL (ref 70–99)
Potassium: 3.3 mmol/L — ABNORMAL LOW (ref 3.5–5.1)
Sodium: 139 mmol/L (ref 135–145)

## 2023-01-02 LAB — CBC
HCT: 32.7 % — ABNORMAL LOW (ref 36.0–46.0)
Hemoglobin: 10 g/dL — ABNORMAL LOW (ref 12.0–15.0)
MCH: 31.6 pg (ref 26.0–34.0)
MCHC: 30.6 g/dL (ref 30.0–36.0)
MCV: 103.5 fL — ABNORMAL HIGH (ref 80.0–100.0)
Platelets: 344 10*3/uL (ref 150–400)
RBC: 3.16 MIL/uL — ABNORMAL LOW (ref 3.87–5.11)
RDW: 13 % (ref 11.5–15.5)
WBC: 6 10*3/uL (ref 4.0–10.5)
nRBC: 0 % (ref 0.0–0.2)

## 2023-01-02 LAB — CK: Total CK: 805 U/L — ABNORMAL HIGH (ref 38–234)

## 2023-01-02 MED ORDER — AMLODIPINE BESYLATE 5 MG PO TABS
5.0000 mg | ORAL_TABLET | Freq: Every day | ORAL | Status: DC
  Administered 2023-01-02 – 2023-01-04 (×3): 5 mg via ORAL
  Filled 2023-01-02 (×3): qty 1

## 2023-01-02 MED ORDER — POTASSIUM CHLORIDE CRYS ER 20 MEQ PO TBCR
40.0000 meq | EXTENDED_RELEASE_TABLET | Freq: Once | ORAL | Status: AC
  Administered 2023-01-02: 40 meq via ORAL
  Filled 2023-01-02: qty 2

## 2023-01-02 MED ORDER — TRAZODONE HCL 50 MG PO TABS
150.0000 mg | ORAL_TABLET | Freq: Every day | ORAL | Status: DC
  Administered 2023-01-02 – 2023-01-03 (×3): 150 mg via ORAL
  Filled 2023-01-02 (×3): qty 3

## 2023-01-02 MED ORDER — PANCRELIPASE (LIP-PROT-AMYL) 12000-38000 UNITS PO CPEP
24000.0000 [IU] | ORAL_CAPSULE | Freq: Three times a day (TID) | ORAL | Status: DC
  Administered 2023-01-02 – 2023-01-04 (×6): 24000 [IU] via ORAL
  Filled 2023-01-02 (×6): qty 2

## 2023-01-02 MED ORDER — PANCRELIPASE (LIP-PROT-AMYL) 12000-38000 UNITS PO CPEP
24000.0000 [IU] | ORAL_CAPSULE | ORAL | Status: DC
  Administered 2023-01-03 (×2): 24000 [IU] via ORAL
  Filled 2023-01-02 (×4): qty 2

## 2023-01-02 NOTE — Progress Notes (Signed)
Spoke with daughter in law gave update on patient status and of noted open area to left buttock.

## 2023-01-02 NOTE — Progress Notes (Signed)
Writer was called to patient's room by NT when taking patient off bedpan noted open small bleeding area to left buttock measures about 1x1x0.1 foam placed, MD notified and charge nurse made aware.

## 2023-01-02 NOTE — Progress Notes (Signed)
PROGRESS NOTE  Katherine Middleton MIW:[id number] DOB: [birth year] DOA: 12/31/2022 PCP: Aletha Halim., PA-C  Brief History:  72 year old female with a history of chronic back pain, GERD, bipolar disorder, hyperlipidemia, stroke, tobacco abuse (30+ pack years), pancreatic IPMN presenting with a fall.  The patient has had frequent falls.  Notably, the patient had a mechanical fall a few days before Christmas.  She came to the ED on 12/25/2022.  X-rays of her left arm showed a mildly displaced proximal left humerus fracture.  She was placed in a sling and set up for outpatient follow-up with orthopedics.  Unfortunately, the patient continued to have generalized weakness and gait instability.  She states that she fell out of bed again in the evening of 12/30/2022.  She had difficulty getting up secondary to generalized weakness.  Her son found her on the floor when he went to check up on her on 12/31/2022.  As result, she was brought to the emergency department for further evaluation and treatment.  Patient denies fevers, chills, headache, chest pain, dyspnea, nausea, vomiting, diarrhea, abdominal pain, dysuria, hematuria, hematochezia, and melena.  In the ED, the patient was afebrile and hemodynamically stable with oxygen saturation 89% on room air.  WBC 10.4, hemoglobin 11.8, platelets 414,000.  sodium 139, potassium 3.0, bicarbonate 31, serum creatinine 0.46.  AST 79, ALT 25, alk phosphatase 70, total bilirubin 0.7.  Magnesium 2.0.  Phosphorus 2.5. CPK 1004.  UA was negative for pyuria.  CT of the brain was negative for acute findings.  Chest x-ray was negative for any acute findings.  Orthopedics was consulted to assist with management.  PT evaluation was requested.   Assessment/Plan: Rhabdomyolysis -CPK 1004>> 1670>>805 -Continue to trend CPK -Continue IV fluids  Gait instability/frequent falls/failure to thrive/underweight -G50--0370 -Folic WUGQ--91.6 -XIH--0.388/EK8 1.05 -PT  evaluation--SNF recommended -UA neg for pyuria -BMI 17.63  Left humerus fracture -Repeat x-ray left humerus--stable  -Orthopedics, Dr. Amedeo Kinsman consulted--outpatient follow up in 2 weeks; nonoperative; manage conservately with sling recommended -received tramadol Rx from ED 12/27/22  Pancreatic IPMN -Patient follows at [facility] -MRI of the abdomen 11/26/2022 showed diffuse dilatation of the main pancreatic duct with multifocal side ductal ectasia which may represent main duct IPMN with sidebranch IPMN -Patient was deemed a poor surgical candidate secondary to poor functional status -Decision was made for continued imaging surveillance  Bipolar disorder -Patient follows Dr. Levonne Spiller -Continue Paxil, Xanax as needed, trazodone at bedtime -PDMP reviewed--alprazolam 0.5 mg, #120, refilled monthly, last rx 12/21/22  Hypokalemia -Repleted; recheck in AM  -Magnesium 2.0  Hypoxia -Presented with oxygen saturation 89% room air -Likely secondary to undiagnosed COPD -Wean oxygen for saturation 92% -Personally reviewed chest x-ray--no infiltrates or edema  Essential Hypertension -added amlodipine 5 mg daily   Family Communication: son updated 01/01/23  Consultants:  ortho--[place]  Code Status:  FULL  DVT Prophylaxis:  [place] Lovenox   Procedures: As Listed in Progress Note Above  Antibiotics: None  Subjective: Agreeable to SNF rehab; reports that pain is better controlled;   Objective: Vitals:   01/01/23 1300 01/01/23 2145 01/02/23 0527 01/02/23 0858  BP: (!) 169/72 (!) 159/70 (!) 145/61 (!) 146/87  Pulse: 60 (!) 50 (!) 52 (!) 58  Resp:  17 17   Temp: 98.3 F (36.8 C) 98.3 F (36.8 C) 98.3 F (36.8 C)   TempSrc: Oral Oral Oral   SpO2: 95% 95% 91%   Weight:      Height:  Intake/Output Summary (Last 24 hours) at 01/02/2023 1112 Last data filed at 01/02/2023 0900 Gross per 24 hour  Intake 2328.34 ml  Output 2250 ml  Net 78.34 ml   Weight change:   Exam:  General:  Pt is alert, follows commands appropriately, not in acute distress HEENT: No icterus, No thrush, No neck mass, [place]/AT Cardiovascular: RRR, S1/S2, no rubs, no gallops Respiratory: fine bibasilar crackles. No wheeze Abdomen: Soft/+BS, non tender, non distended, no guarding Extremities: arm sling in place; No edema, No lymphangitis, No petechiae, No rashes, no synovitis  Data Reviewed: I have personally reviewed following labs and imaging studies Basic Metabolic Panel: Recent Labs  Lab 12/31/22 1723 01/01/23 0429 01/02/23 0437  NA 142 139 139  K 4.1 3.0* 3.3*  CL 101 100 104  CO2 32 31 29  GLUCOSE 138* 110* 98  BUN 11 8 7*  CREATININE 0.46 0.46 <0.30*  CALCIUM 8.9 8.3* 7.8*  MG  --  2.0  --   PHOS  --  2.5  --    Liver Function Tests: Recent Labs  Lab 12/31/22 1723 01/01/23 0429  AST 60* 79*  ALT 21 25  ALKPHOS 88 78  BILITOT 0.7 0.7  PROT 7.0 6.1*  ALBUMIN 3.7 3.2*   No results for input(s): "LIPASE", "AMYLASE" in the last 168 hours. No results for input(s): "AMMONIA" in the last 168 hours. Coagulation Profile: No results for input(s): "INR", "PROTIME" in the last 168 hours. CBC: Recent Labs  Lab 12/31/22 1723 01/01/23 0429 01/02/23 0437  WBC 10.4 6.8 6.0  NEUTROABS 8.9*  --   --   HGB 11.8* 10.9* 10.0*  HCT 37.3 34.5* 32.7*  MCV 102.8* 101.2* 103.5*  PLT 414* 425* 344   Cardiac Enzymes: Recent Labs  Lab 12/31/22 1723 01/01/23 0429 01/02/23 0437  CKTOTAL 1,004* 1,670* 805*   BNP: Invalid input(s): "POCBNP" CBG: No results for input(s): "GLUCAP" in the last 168 hours. HbA1C: No results for input(s): "HGBA1C" in the last 72 hours. Urine analysis:    Component Value Date/Time   COLORURINE YELLOW 12/31/2022 2221   APPEARANCEUR HAZY (A) 12/31/2022 2221   LABSPEC 1.017 12/31/2022 2221   PHURINE 6.0 12/31/2022 2221   GLUCOSEU NEGATIVE 12/31/2022 2221   HGBUR MODERATE (A) 12/31/2022 2221   BILIRUBINUR NEGATIVE 12/31/2022 2221    KETONESUR 20 (A) 12/31/2022 2221   PROTEINUR 100 (A) 12/31/2022 2221   NITRITE NEGATIVE 12/31/2022 2221   LEUKOCYTESUR NEGATIVE 12/31/2022 2221   Scheduled Meds:  cholecalciferol  5,000 Units Oral Daily   cyanocobalamin  1,000 mcg Oral Daily   enoxaparin (LOVENOX) injection  40 mg Subcutaneous Q24H   feeding supplement  237 mL Oral BID BM   gabapentin  300 mg Oral BID   lipase/protease/amylase  24,000 Units Oral Daily   multivitamin with minerals  1 tablet Oral Daily   pantoprazole  40 mg Oral Daily   PARoxetine  40 mg Oral Daily   pneumococcal 20-valent conjugate vaccine  0.5 mL Intramuscular Tomorrow-1000   traZODone  150 mg Oral QHS   Continuous Infusions:  0.9 % NaCl with KCl 20 mEq / L 100 mL/hr at 01/01/23 2105    Procedures/Studies: DG Humerus Left  Result Date: 01/01/2023 CLINICAL DATA:  242828 Proximal humerus fracture [id number] EXAM: LEFT HUMERUS - 2+ VIEW COMPARISON:  Radiograph 12/25/2022 FINDINGS: There is a mild displaced and comminuted left proximal humerus fracture through the surgical neck and involving the greater tuberosity. Alignment is not significantly changed from prior. No new  fracture. IMPRESSION: Unchanged alignment of the mildly displaced and comminuted proximal humerus fracture. Electronically Signed   By: Maurine Simmering M.D.   On: 01/01/2023 08:39   DG Chest Port 1 View  Result Date: 12/31/2022 CLINICAL DATA:  Cough. Multiple falls over last few weeks. Broke left shoulder 4 weeks ago. EXAM: PORTABLE CHEST 1 VIEW COMPARISON:  11/20/2016, 07/17/2021. FINDINGS: The heart size and mediastinal contours are within normal limits. There is atherosclerotic calcification of the aorta. No consolidation, effusion, or pneumothorax. There is bony deformity of the proximal left humerus, compatible with known fracture. There is a compression fracture at T11 which is unchanged from the previous exam. IMPRESSION: No active disease. Electronically Signed   By: Brett Fairy M.D.   On:  12/31/2022 22:44   CT Head Wo Contrast  Result Date: 12/31/2022 CLINICAL DATA:  Head trauma multiple fall EXAM: CT HEAD WITHOUT CONTRAST TECHNIQUE: Contiguous axial images were obtained from the base of the skull through the vertex without intravenous contrast. RADIATION DOSE REDUCTION: This exam was performed according to the departmental dose-optimization program which includes automated exposure control, adjustment of the mA and/or kV according to patient size and/or use of iterative reconstruction technique. COMPARISON:  MRI 03/22/2021 FINDINGS: Brain: No acute territorial infarction, hemorrhage, or intracranial mass. Mild atrophy. Mild chronic small vessel ischemic changes of the white matter Vascular: No hyperdense vessels.  Carotid vascular calcification. Skull: Normal. Negative for fracture or focal lesion. Sinuses/Orbits: No acute finding. Other: None IMPRESSION: 1. No CT evidence for acute intracranial abnormality. 2. Atrophy and chronic small vessel ischemic changes of the white matter. Electronically Signed   By: Donavan Foil M.D.   On: 12/31/2022 20:50   DG Humerus Left  Result Date: 12/25/2022 CLINICAL DATA:  Fall. EXAM: LEFT HUMERUS - 2+ VIEW COMPARISON:  None Available. FINDINGS: Mildly displaced proximal left humeral neck fracture is noted. IMPRESSION: Mildly displaced proximal left humeral neck fracture. Electronically Signed   By: Marijo Conception M.D.   On: 12/25/2022 14:17    Irwin Brakeman, MD  [facility]  If 7PM-7AM, please contact night-coverage [email or web address] Password [email or web address] 01/02/2023, 11:12 AM   LOS: 1 day

## 2023-01-02 NOTE — TOC Progression Note (Addendum)
Transition of Care [facility]) - Progression Note    Patient Details  Name: Katherine Middleton MRN: [id number] Date of Birth: 1951/12/24  [facility] CM/SW Contact  Boneta Lucks, RN Phone Number: 01/02/2023, 10:50 AM  Clinical Narrative:   Discussed bed offers with Fritz Pickerel, son, he is approving [employer].  INS Auth started.   PASRR received Rosalie Gums [phone] [id number]     Expected Discharge Plan: [place] Barriers to Discharge: Continued Medical Work up  Expected Discharge Plan and Services      Living arrangements for the past 2 months: Single Family Home                   Social Determinants of Health (SDOH) Interventions SDOH Screenings   Food Insecurity: No Food Insecurity (01/01/2023)  Housing: Low Risk  (01/01/2023)  Transportation Needs: No Transportation Needs (01/01/2023)  Utilities: Not At Risk (01/01/2023)  Depression (PHQ2-9): Low Risk  (04/19/2022)  Tobacco Use: High Risk (12/31/2022)    Readmission Risk Interventions    01/01/2023    2:18 PM  Readmission Risk Prevention Plan  Post Dischage Appt Not Complete  Medication Screening Complete  Transportation Screening Complete

## 2023-01-03 ENCOUNTER — Other Ambulatory Visit (HOSPITAL_COMMUNITY): Payer: Self-pay | Admitting: Psychiatry

## 2023-01-03 DIAGNOSIS — K8689 Other specified diseases of pancreas: Secondary | ICD-10-CM | POA: Diagnosis not present

## 2023-01-03 DIAGNOSIS — R627 Adult failure to thrive: Secondary | ICD-10-CM | POA: Diagnosis not present

## 2023-01-03 DIAGNOSIS — F319 Bipolar disorder, unspecified: Secondary | ICD-10-CM

## 2023-01-03 DIAGNOSIS — Z720 Tobacco use: Secondary | ICD-10-CM | POA: Diagnosis not present

## 2023-01-03 DIAGNOSIS — M6282 Rhabdomyolysis: Secondary | ICD-10-CM | POA: Diagnosis not present

## 2023-01-03 LAB — BASIC METABOLIC PANEL
Anion gap: 6 (ref 5–15)
BUN: 7 mg/dL — ABNORMAL LOW (ref 8–23)
CO2: 29 mmol/L (ref 22–32)
Calcium: 8.1 mg/dL — ABNORMAL LOW (ref 8.9–10.3)
Chloride: 106 mmol/L (ref 98–111)
Creatinine, Ser: 0.56 mg/dL (ref 0.44–1.00)
GFR, Estimated: >60 mL/min (ref >60)
Glucose, Bld: 99 mg/dL (ref 70–99)
Potassium: 3.9 mmol/L (ref 3.5–5.1)
Sodium: 141 mmol/L (ref 135–145)

## 2023-01-03 LAB — CK: Total CK: 374 U/L — ABNORMAL HIGH (ref 38–234)

## 2023-01-03 MED ORDER — IPRATROPIUM-ALBUTEROL 0.5-2.5 (3) MG/3ML IN SOLN: 3.0000 mL | RESPIRATORY_TRACT | Status: DC | PRN

## 2023-01-03 MED ORDER — ENSURE ENLIVE PO LIQD: 237.0000 mL | Freq: Two times a day (BID) | ORAL | 12 refills | Status: AC

## 2023-01-03 MED ORDER — ADULT MULTIVITAMIN W/MINERALS CH: 1.0000 | ORAL_TABLET | Freq: Every day | ORAL | Status: AC

## 2023-01-03 MED ORDER — AMLODIPINE BESYLATE 5 MG PO TABS: 5.0000 mg | ORAL_TABLET | Freq: Every day | ORAL | Status: DC

## 2023-01-03 MED ORDER — ALPRAZOLAM 0.5 MG PO TABS: 0.5000 mg | ORAL_TABLET | Freq: Three times a day (TID) | ORAL | 0 refills | Status: DC | PRN

## 2023-01-03 MED ORDER — ACETAMINOPHEN 325 MG PO TABS: 650.0000 mg | ORAL_TABLET | Freq: Four times a day (QID) | ORAL | Status: DC

## 2023-01-03 NOTE — Care Management Important Message (Signed)
Important Message  Patient Details  Name: Katherine Middleton MRN: [id number] Date of Birth: 04/01/1951   Medicare Important Message Given:  N/A - LOS <3 / Initial given by admissions     Tommy Medal 01/03/2023, 10:01 AM

## 2023-01-03 NOTE — Discharge Summary (Signed)
Physician Discharge Summary  Katherine Middleton [place] UYZ:[id number] DOB: 12/19/1951 DOA: 12/31/2022  PCP: Aletha Halim., PA-C Orthopedics: Dr. Amedeo Kinsman   Admit date: 12/31/2022 Discharge date: 01/03/2023  Admitted From:  Home  Disposition:  SNF Rehab   Recommendations for Outpatient Follow-up:  Follow up with Dr. Amedeo Kinsman in 1-2 weeks Please titrate/adjust blood pressure medication as needed  Orthopedic Instructions - Weight Bearing Status/Activity: Nonweightbearing, in a sling   - Pain control: P.o. pain medications, as needed   - Follow-up plan: 1-2 weeks with Dr. Amedeo Kinsman - Orthopedics   Wound Care Instructions: left buttock wound Pt has a partial thickness abrasion to left buttock, 1X1X.1cm, according to nursing notes, red and moist.  Foam dressing to left buttock, change Q 3 days or PRN soiling.  If patient is stooling frequently, leave dressing off and apply barrier cream.   Discharge Condition: STABLE   CODE STATUS: FULL DIET: 2 gm sodium restricted    Brief Hospitalization Summary: Please see all hospital notes, images, labs for full details of the hospitalization. ADMISSION HPI:  72 year old female with a history of chronic back pain, GERD, bipolar disorder, hyperlipidemia, stroke, tobacco abuse (30+ pack years), pancreatic IPMN presenting with a fall.  The patient has had frequent falls.  Notably, the patient had a mechanical fall a few days before Christmas.  She came to the ED on 12/25/2022.  X-rays of her left arm showed a mildly displaced proximal left humerus fracture.  She was placed in a sling and set up for outpatient follow-up with orthopedics.  Unfortunately, the patient continued to have generalized weakness and gait instability.  She states that she fell out of bed again in the evening of 12/30/2022.  She had difficulty getting up secondary to generalized weakness.  Her son found her on the floor when he went to check up on her on 12/31/2022.  As result, she was brought to the  emergency department for further evaluation and treatment.  Patient denies fevers, chills, headache, chest pain, dyspnea, nausea, vomiting, diarrhea, abdominal pain, dysuria, hematuria, hematochezia, and melena.   In the ED, the patient was afebrile and hemodynamically stable with oxygen saturation 89% on room air.  WBC 10.4, hemoglobin 11.8, platelets 414,000.  sodium 139, potassium 3.0, bicarbonate 31, serum creatinine 0.46.  AST 79, ALT 25, alk phosphatase 70, total bilirubin 0.7.  Magnesium 2.0.  Phosphorus 2.5. CPK 1004.  UA was negative for pyuria.  CT of the brain was negative for acute findings.  Chest x-ray was negative for any acute findings.  Orthopedics was consulted to assist with management.  PT evaluation was requested.    Assessment/Plan: Rhabdomyolysis -CPK 1004>> 1670>>805>>300 -Continue to trend CPK -treated with IV fluids   Gait instability/frequent falls/failure to thrive/underweight -F84--0375 -Folic OHKG--67.7 -CHE--0.352/YE1 1.05 -PT evaluation--SNF recommended -UA neg for pyuria -BMI 17.63   Left humerus fracture -Repeat x-ray left humerus--stable  -Orthopedics, Dr. Amedeo Kinsman consulted--outpatient follow up in 2 weeks; nonoperative; manage conservately with sling recommended -due to multiple drug intolerances given acetaminophen around the clock for pain   Pancreatic IPMN -Patient follows at [facility] -MRI of the abdomen 11/26/2022 showed diffuse dilatation of the main pancreatic duct with multifocal side ductal ectasia which may represent main duct IPMN with sidebranch IPMN -Patient was deemed a poor surgical candidate secondary to poor functional status -Decision was made for continued imaging surveillance -continue Creon with each meal and snacks   Bipolar disorder -Patient follows Dr. Levonne Spiller -Continue Paxil, Xanax as needed, trazodone at bedtime -PDMP  reviewed--alprazolam 0.5 mg, #120, refilled monthly, last rx 12/21/22   Hypokalemia -Repleted; recheck  in AM  -Magnesium 2.0   Hypoxia -Presented with oxygen saturation 89% room air -Likely secondary to undiagnosed COPD -Wean oxygen for saturation 92% -Personally reviewed chest x-ray--no infiltrates or edema -wean to room air   Essential Hypertension -added amlodipine 5 mg daily    Discharge Diagnoses:  Principal Problem:   Rhabdomyolysis Active Problems:   GERD (gastroesophageal reflux disease)   Anxiety   Chronic back pain   Fall at home, initial encounter   Failure to thrive in adult   Underweight   Closed displaced fracture of surgical neck of left humerus   Generalized weakness   Macrocytic anemia   Pancreatic insufficiency   Depression   Tobacco abuse   IPMN (intraductal papillary mucinous neoplasm)   Discharge Instructions:  Allergies as of 01/03/2023       Reactions   Amoxicillin-pot Clavulanate Anaphylaxis, Swelling   Cephalosporins Anaphylaxis   Other reaction(s): Unknown   Codeine Itching   Cymbalta [duloxetine Hcl] Other (See Comments)   GERD   Duloxetine    Other reaction(s): Other (See Comments), Unknown GERD   Oxycodone Itching, Other (See Comments)   "OUT THERE", climbing the walls   Penicillins Anaphylaxis, Swelling   Throat swelling Reaction: 8-10 years ago   Baclofen Other (See Comments)   Anxiety got much worse and possibly ppted manic episode Other reaction(s): Unknown   Povidone Iodine Itching, Other (See Comments)   Particularly if in a douche  Other reaction(s): Unknown   Flonase [fluticasone]    Makes eye pressure to increase   Propoxyphene Itching   Anxiety/jittery    Sulfa Antibiotics Swelling   Sulfasalazine Swelling   Other reaction(s): Unknown   Tramadol    Other reaction(s): Other (See Comments), Unknown Patient states that this makes her nervous   Amitriptyline Other (See Comments)   Caused bowels to empty quickly and couldn't sleep on it. Other reaction(s): Other (See Comments), Unknown Caused bowels to empty quickly  and couldn't sleep on it. Caused bowels to empty quickly and couldn't sleep on it. Caused bowels to empty quickly and couldn't sleep on it.   Aspirin Hives, Rash   Latex Swelling, Other (See Comments)   If in the mouth, it swells   Nsaids Nausea Only, Other (See Comments)   Almost passing out, bad acid reflux Other reaction(s): Other (See Comments) other   Tolmetin Nausea Only   Other reaction(s): Other (See Comments), Unknown Almost passing out, bad acid reflux Almost passing out, bad acid reflux        Medication List     STOP taking these medications    acetaminophen 650 MG CR tablet Commonly known as: TYLENOL Replaced by: acetaminophen 325 MG tablet       TAKE these medications    acetaminophen 325 MG tablet Commonly known as: TYLENOL Take 2 tablets (650 mg total) by mouth 4 (four) times daily. Replaces: acetaminophen 650 MG CR tablet   ALPRAZolam 0.5 MG tablet Commonly known as: XANAX Take 1 tablet (0.5 mg total) by mouth 3 (three) times daily as needed for anxiety. What changed:  when to take this reasons to take this   amLODipine 5 MG tablet Commonly known as: NORVASC Take 1 tablet (5 mg total) by mouth daily. Start taking on: January 04, 2023   cetirizine 10 MG tablet Commonly known as: ZYRTEC Take 10 mg by mouth at bedtime.   Creon 24000-76000 units Cpep Generic  drug: Pancrelipase (Lip-Prot-Amyl) Take 2 capsules with each meal, 1 after starting/1 when finished eating, and 1 capsule with snack after starting to eat.   cyanocobalamin 1000 MCG tablet Commonly known as: VITAMIN B12 Take 1,000 mcg by mouth daily.   feeding supplement Liqd Take 237 mLs by mouth 2 (two) times daily between meals.   gabapentin 300 MG capsule Commonly known as: NEURONTIN Take 2 capsules (600 mg total) by mouth 2 (two) times daily.   ipratropium-albuterol 0.5-2.5 (3) MG/3ML Soln Commonly known as: DUONEB Take 3 mLs by nebulization every 4 (four) hours as needed.    multivitamin with minerals Tabs tablet Take 1 tablet by mouth daily. Start taking on: January 04, 2023   omeprazole 20 MG capsule Commonly known as: PRILOSEC Take 20 mg by mouth daily.   PARoxetine 40 MG tablet Commonly known as: PAXIL Take 1 tablet (40 mg total) by mouth every morning.   sodium chloride 0.65 % Soln nasal spray Commonly known as: OCEAN Place 1 spray into both nostrils as needed for congestion.   Systane Ultra PF 0.4-0.3 % Soln Generic drug: Polyethyl Glyc-Propyl Glyc PF Place 1 drop into both eyes daily as needed (Dry eye).   tiZANidine 4 MG tablet Commonly known as: ZANAFLEX Take 4 mg by mouth every 8 (eight) hours as needed (muscle pain).   traZODone 150 MG tablet Commonly known as: DESYREL Take 1 tablet (150 mg total) by mouth at bedtime.   Vitamin D3 125 MCG (5000 UT) Tabs Take 5,000 Units by mouth daily.        Contact information for follow-up providers     Mordecai Rasmussen, MD. Schedule an appointment as soon as possible for a visit in 2 week(s).   Specialties: Orthopedic Surgery, Sports Medicine Why: Hospital Follow Up Contact information: [place]. [address] [place] [address] [phone]              Contact information for after-discharge care     [facility] .   Service: Skilled Nursing Contact information: [address] [address] [phone]                    Allergies  Allergen Reactions   Amoxicillin-Pot Clavulanate Anaphylaxis and Swelling   Cephalosporins Anaphylaxis    Other reaction(s): Unknown   Codeine Itching   Cymbalta [Duloxetine Hcl] Other (See Comments)    GERD   Duloxetine     Other reaction(s): Other (See Comments), Unknown GERD    Oxycodone Itching and Other (See Comments)    "OUT THERE", climbing the walls   Penicillins Anaphylaxis and Swelling    Throat swelling Reaction: 8-10 years ago    Baclofen Other (See Comments)    Anxiety got much worse and possibly ppted manic episode Other reaction(s): Unknown   Povidone Iodine Itching and Other (See Comments)    Particularly if in a douche  Other reaction(s): Unknown   Flonase [Fluticasone]     Makes eye pressure to increase   Propoxyphene Itching    Anxiety/jittery    Sulfa Antibiotics Swelling   Sulfasalazine Swelling    Other reaction(s): Unknown   Tramadol     Other reaction(s): Other (See Comments), Unknown Patient states that this makes her nervous    Amitriptyline Other (See Comments)    Caused bowels to empty quickly and couldn't sleep on it. Other reaction(s): Other (See Comments), Unknown Caused bowels to empty  quickly and couldn't sleep on it. Caused bowels to empty quickly and couldn't sleep on it. Caused bowels to empty quickly and couldn't sleep on it.    Aspirin Hives and Rash   Latex Swelling and Other (See Comments)    If in the mouth, it swells   Nsaids Nausea Only and Other (See Comments)    Almost passing out, bad acid reflux Other reaction(s): Other (See Comments) other   Tolmetin Nausea Only    Other reaction(s): Other (See Comments), Unknown Almost passing out, bad acid reflux Almost passing out, bad acid reflux    Allergies as of 01/03/2023       Reactions   Amoxicillin-pot Clavulanate Anaphylaxis, Swelling   Cephalosporins Anaphylaxis   Other reaction(s): Unknown   Codeine Itching   Cymbalta [duloxetine Hcl] Other (See Comments)   GERD   Duloxetine    Other reaction(s): Other (See Comments), Unknown GERD   Oxycodone Itching, Other (See Comments)   "OUT THERE", climbing the walls   Penicillins Anaphylaxis, Swelling   Throat swelling Reaction: 8-10 years ago   Baclofen Other (See Comments)   Anxiety got much worse and possibly ppted manic episode Other reaction(s): Unknown   Povidone Iodine Itching, Other (See Comments)   Particularly if in a douche  Other reaction(s): Unknown    Flonase [fluticasone]    Makes eye pressure to increase   Propoxyphene Itching   Anxiety/jittery    Sulfa Antibiotics Swelling   Sulfasalazine Swelling   Other reaction(s): Unknown   Tramadol    Other reaction(s): Other (See Comments), Unknown Patient states that this makes her nervous   Amitriptyline Other (See Comments)   Caused bowels to empty quickly and couldn't sleep on it. Other reaction(s): Other (See Comments), Unknown Caused bowels to empty quickly and couldn't sleep on it. Caused bowels to empty quickly and couldn't sleep on it. Caused bowels to empty quickly and couldn't sleep on it.   Aspirin Hives, Rash   Latex Swelling, Other (See Comments)   If in the mouth, it swells   Nsaids Nausea Only, Other (See Comments)   Almost passing out, bad acid reflux Other reaction(s): Other (See Comments) other   Tolmetin Nausea Only   Other reaction(s): Other (See Comments), Unknown Almost passing out, bad acid reflux Almost passing out, bad acid reflux        Medication List     STOP taking these medications    acetaminophen 650 MG CR tablet Commonly known as: TYLENOL Replaced by: acetaminophen 325 MG tablet       TAKE these medications    acetaminophen 325 MG tablet Commonly known as: TYLENOL Take 2 tablets (650 mg total) by mouth 4 (four) times daily. Replaces: acetaminophen 650 MG CR tablet   ALPRAZolam 0.5 MG tablet Commonly known as: XANAX Take 1 tablet (0.5 mg total) by mouth 3 (three) times daily as needed for anxiety. What changed:  when to take this reasons to take this   amLODipine 5 MG tablet Commonly known as: NORVASC Take 1 tablet (5 mg total) by mouth daily. Start taking on: January 04, 2023   cetirizine 10 MG tablet Commonly known as: ZYRTEC Take 10 mg by mouth at bedtime.   Creon 24000-76000 units Cpep Generic drug: Pancrelipase (Lip-Prot-Amyl) Take 2 capsules with each meal, 1 after starting/1 when finished eating, and 1 capsule  with snack after starting to eat.   cyanocobalamin 1000 MCG tablet Commonly known as: VITAMIN B12 Take 1,000 mcg by mouth daily.  feeding supplement Liqd Take 237 mLs by mouth 2 (two) times daily between meals.   gabapentin 300 MG capsule Commonly known as: NEURONTIN Take 2 capsules (600 mg total) by mouth 2 (two) times daily.   ipratropium-albuterol 0.5-2.5 (3) MG/3ML Soln Commonly known as: DUONEB Take 3 mLs by nebulization every 4 (four) hours as needed.   multivitamin with minerals Tabs tablet Take 1 tablet by mouth daily. Start taking on: January 04, 2023   omeprazole 20 MG capsule Commonly known as: PRILOSEC Take 20 mg by mouth daily.   PARoxetine 40 MG tablet Commonly known as: PAXIL Take 1 tablet (40 mg total) by mouth every morning.   sodium chloride 0.65 % Soln nasal spray Commonly known as: OCEAN Place 1 spray into both nostrils as needed for congestion.   Systane Ultra PF 0.4-0.3 % Soln Generic drug: Polyethyl Glyc-Propyl Glyc PF Place 1 drop into both eyes daily as needed (Dry eye).   tiZANidine 4 MG tablet Commonly known as: ZANAFLEX Take 4 mg by mouth every 8 (eight) hours as needed (muscle pain).   traZODone 150 MG tablet Commonly known as: DESYREL Take 1 tablet (150 mg total) by mouth at bedtime.   Vitamin D3 125 MCG (5000 UT) Tabs Take 5,000 Units by mouth daily.        Procedures/Studies: DG Humerus Left  Result Date: 01/01/2023 CLINICAL DATA:  [id number] Proximal humerus fracture [id number] EXAM: LEFT HUMERUS - 2+ VIEW COMPARISON:  Radiograph 12/25/2022 FINDINGS: There is a mild displaced and comminuted left proximal humerus fracture through the surgical neck and involving the greater tuberosity. Alignment is not significantly changed from prior. No new fracture. IMPRESSION: Unchanged alignment of the mildly displaced and comminuted proximal humerus fracture. Electronically Signed   By: Maurine Simmering M.D.   On: 01/01/2023 08:39   DG Chest Port 1  View  Result Date: 12/31/2022 CLINICAL DATA:  Cough. Multiple falls over last few weeks. Broke left shoulder 4 weeks ago. EXAM: PORTABLE CHEST 1 VIEW COMPARISON:  11/20/2016, 07/17/2021. FINDINGS: The heart size and mediastinal contours are within normal limits. There is atherosclerotic calcification of the aorta. No consolidation, effusion, or pneumothorax. There is bony deformity of the proximal left humerus, compatible with known fracture. There is a compression fracture at T11 which is unchanged from the previous exam. IMPRESSION: No active disease. Electronically Signed   By: Brett Fairy M.D.   On: 12/31/2022 22:44   CT Head Wo Contrast  Result Date: 12/31/2022 CLINICAL DATA:  Head trauma multiple fall EXAM: CT HEAD WITHOUT CONTRAST TECHNIQUE: Contiguous axial images were obtained from the base of the skull through the vertex without intravenous contrast. RADIATION DOSE REDUCTION: This exam was performed according to the departmental dose-optimization program which includes automated exposure control, adjustment of the mA and/or kV according to patient size and/or use of iterative reconstruction technique. COMPARISON:  MRI 03/22/2021 FINDINGS: Brain: No acute territorial infarction, hemorrhage, or intracranial mass. Mild atrophy. Mild chronic small vessel ischemic changes of the white matter Vascular: No hyperdense vessels.  Carotid vascular calcification. Skull: Normal. Negative for fracture or focal lesion. Sinuses/Orbits: No acute finding. Other: None IMPRESSION: 1. No CT evidence for acute intracranial abnormality. 2. Atrophy and chronic small vessel ischemic changes of the white matter. Electronically Signed   By: Donavan Foil M.D.   On: 12/31/2022 20:50   DG Humerus Left  Result Date: 12/25/2022 CLINICAL DATA:  Fall. EXAM: LEFT HUMERUS - 2+ VIEW COMPARISON:  None Available. FINDINGS: Mildly displaced proximal left humeral  neck fracture is noted. IMPRESSION: Mildly displaced proximal left  humeral neck fracture. Electronically Signed   By: Marijo Conception M.D.   On: 12/25/2022 14:17     Subjective: Pt having some pain with manipulation or movement involving arm.    Discharge Exam: Vitals:   01/02/23 2130 01/03/23 0540  BP: (!) 146/59 (!) 164/73  Pulse: (!) 55 (!) 57  Resp: 19 18  Temp: 98 F (36.7 C) 98 F (36.7 C)  SpO2: 91% (!) 86%   Vitals:   01/02/23 1141 01/02/23 1327 01/02/23 2130 01/03/23 0540  BP: 139/64 137/62 (!) 146/59 (!) 164/73  Pulse:  (!) 53 (!) 55 (!) 57  Resp:  _0 Temp:  98.5 F (36.9 C) 98 F (36.7 C) 98 F (36.7 C)  TempSrc:  Oral    SpO2:  90% 91% (!) 86%  Weight:      Height:       General:  Pt is alert, follows commands appropriately, not in acute distress HEENT: No icterus, No thrush, No neck mass, [place]/AT Cardiovascular: normal S1/S2, no rubs, no gallops Respiratory: no increased work of breathing.  No wheeze Abdomen: Soft/+BS, non tender, non distended, no guarding Extremities: arm sling in place; No edema, No lymphangitis, No petechiae, No rashes, no synovitis   The results of significant diagnostics from this hospitalization (including imaging, microbiology, ancillary and laboratory) are listed below for reference.     Microbiology: No results found for this or any previous visit (from the past 240 hour(s)).   Labs: BNP (last 3 results) No results for input(s): "BNP" in the last 8760 hours. Basic Metabolic Panel: Recent Labs  Lab 12/31/22 1723 01/01/23 0429 01/02/23 0437 01/03/23 0416  NA 142 139 139 141  K 4.1 3.0* 3.3* 3.9  CL 101 100 104 106  CO2 32 _1 GLUCOSE 138* 110* 98 99  BUN 11 8 7* 7*  CREATININE 0.46 0.46 <0.30* 0.56  CALCIUM 8.9 8.3* 7.8* 8.1*  MG  --  2.0  --   --   PHOS  --  2.5  --   --    Liver Function Tests: Recent Labs  Lab 12/31/22 1723 01/01/23 0429  AST 60* 79*  ALT 21 25  ALKPHOS 88 78  BILITOT 0.7 0.7  PROT 7.0 6.1*  ALBUMIN 3.7 3.2*   No results for input(s):  "LIPASE", "AMYLASE" in the last 168 hours. No results for input(s): "AMMONIA" in the last 168 hours. CBC: Recent Labs  Lab 12/31/22 1723 01/01/23 0429 01/02/23 0437  WBC 10.4 6.8 6.0  NEUTROABS 8.9*  --   --   HGB 11.8* 10.9* 10.0*  HCT 37.3 34.5* 32.7*  MCV 102.8* 101.2* 103.5*  PLT 414* 425* 344   Cardiac Enzymes: Recent Labs  Lab 12/31/22 1723 01/01/23 0429 01/02/23 0437 01/03/23 0416  CKTOTAL 1,004* 1,670* 805* 374*   BNP: Invalid input(s): "POCBNP" CBG: No results for input(s): "GLUCAP" in the last 168 hours. D-Dimer No results for input(s): "DDIMER" in the last 72 hours. Hgb A1c No results for input(s): "HGBA1C" in the last 72 hours. Lipid Profile No results for input(s): "CHOL", "HDL", "LDLCALC", "TRIG", "CHOLHDL", "LDLDIRECT" in the last 72 hours. Thyroid function studies Recent Labs    01/01/23 1009  TSH 2.099   Anemia work up Recent Labs    01/01/23 1009  VITAMINB12 1,194*  FOLATE 19.6   Urinalysis    Component Value Date/Time   COLORURINE YELLOW 12/31/2022 2221  APPEARANCEUR HAZY (A) 12/31/2022 2221   LABSPEC 1.017 12/31/2022 2221   PHURINE 6.0 12/31/2022 2221   GLUCOSEU NEGATIVE 12/31/2022 2221   HGBUR MODERATE (A) 12/31/2022 2221   BILIRUBINUR NEGATIVE 12/31/2022 2221   KETONESUR 20 (A) 12/31/2022 2221   PROTEINUR 100 (A) 12/31/2022 2221   NITRITE NEGATIVE 12/31/2022 2221   LEUKOCYTESUR NEGATIVE 12/31/2022 2221   Sepsis Labs Recent Labs  Lab 12/31/22 1723 01/01/23 0429 01/02/23 0437  WBC 10.4 6.8 6.0   Microbiology No results found for this or any previous visit (from the past 240 hour(s)).  Time coordinating discharge: 48 mins  SIGNED:  Irwin Brakeman, MD  [facility] 01/03/2023, 11:31 AM How to contact the [facility] Attending or Consulting provider [place] or covering provider during after hours [place], for this patient?  Check the care team in [facility] and look for a) attending/consulting [facility] provider listed and b) the [facility]  team listed Log into [email or web address] and use [place]'s universal password to access. If you do not have the password, please contact the hospital operator. Locate the [facility] provider you are looking for under [facility] and page to a number that you can be directly reached. If you still have difficulty reaching the provider, please page the [facility] (Director on Call) for the Hospitalists listed on amion for assistance.

## 2023-01-03 NOTE — Discharge Instructions (Addendum)
Orthopedic Instructions - Weight Bearing Status/Activity: Nonweightbearing, in a sling   - Pain control: P.o. pain medications, as needed   - Follow-up plan: 1-2 weeks with Dr. Amedeo Kinsman - Orthopedics   Wound Care Instructions: left buttock wound Pt has a partial thickness abrasion to left buttock, 1X1X.1cm, according to nursing notes, red and moist.  Foam dressing to left buttock, change Q 3 days or PRN soiling.  If patient is stooling frequently, leave dressing off and apply barrier cream.

## 2023-01-03 NOTE — TOC Transition Note (Addendum)
Transition of Care [facility]) - CM/SW Discharge Note   Patient Details  Name: Katherine Middleton MRN: [id number] Date of Birth: 03/06/1951  Transition of Care [facility] CM/SW Contact:  Boneta Lucks, RN Phone Number: 01/03/2023, 2:42 PM   Clinical Narrative:  Patient is medically ready to discharge to [facility]. CM spoke with daughter- Sports coach. TOC is waiting on INS AUTH. Team updated. TOC following for AUTH   Addendum:  Ebony Hail saying even if we got AUTH now, it would be to late getting her there with EMS delay.  Team and family updated.  TOC to follow for INS AUTH. Avoidable day added.   Final next level of care: Skilled Nursing Facility Barriers to Discharge: Other (must enter comment) (INS AUTH)   Patient Goals and CMS Choice CMS Medicare.gov Compare Post Acute Care list provided to:: Patient Represenative (must comment) Choice offered to / list presented to : Adult Children  Discharge Placement               Patient chooses bed at:  [facility]) Patient to be transferred to facility by: EMS Name of family member notified: Daughter in Law Patient and family notified of of transfer: 01/03/23  Discharge Plan and Services Additional resources added to the After Visit Summary for     Social Determinants of Health (SDOH) Interventions SDOH Screenings   Food Insecurity: No Food Insecurity (01/01/2023)  Housing: Low Risk  (01/01/2023)  Transportation Needs: No Transportation Needs (01/01/2023)  Utilities: Not At Risk (01/01/2023)  Depression (PHQ2-9): Low Risk  (04/19/2022)  Tobacco Use: High Risk (12/31/2022)    Readmission Risk Interventions    01/01/2023    2:18 PM  Readmission Risk Prevention Plan  Post Dischage Appt Not Complete  Medication Screening Complete  Transportation Screening Complete

## 2023-01-03 NOTE — Consult Note (Addendum)
WOC consult requested for buttock wound.  Performed remotely after review of progress notes.   Pt has a partial thickness abrasion to left buttock, 1X1X.1cm, according to nursing notes, red and moist. These can be treated independently but the bedside nurse, using the skin care order set in Epic: Foam dressing to left buttock, change Q 3 days or PRN soiling.  If patient is stooling frequently, leave dressing off and apply barrier cream. Please re-consult if further assistance is needed.  Gae Dry MSN, RN, [place], [place], [place]  .

## 2023-01-04 NOTE — Care Management Important Message (Signed)
Important Message  Patient Details  Name: Katherine Middleton MRN: [id number] Date of Birth: 1951/07/29   Medicare Important Message Given:  N/A - LOS <3 / Initial given by admissions     Tommy Medal 01/04/2023, 11:33 AM

## 2023-01-04 NOTE — Progress Notes (Signed)
Patient discharged to [facility], transported by EMS to facility. Discharge summary placed in discharge packet to give to receiving facility. Belongings sent with patient. Contacted patients son Fritz Pickerel regarding home medications in pharmacy, per son will come and get them after he gets off work today.

## 2023-01-04 NOTE — TOC Transition Note (Signed)
Transition of Care [facility]) - CM/SW Discharge Note   Patient Details  Name: Katherine Middleton MRN: [id number] Date of Birth: 1951-09-27  Transition of Care [facility]) CM/SW Contact:  Salome Arnt, LCSW Phone Number: 01/04/2023, 10:27 AM   Clinical Narrative: Pt d/c today to [facility]. Auth received. Pt notified and requested LCSW call son. Pt's son, Fritz Pickerel also aware. D/C summary sent to SNF. Pt requesting transport via [facility]. LCSW will arrange. RN given number to call report.       Final next level of care: Skilled Nursing Facility Barriers to Discharge: Barriers Resolved   Patient Goals and CMS Choice CMS Medicare.gov Compare Post Acute Care list provided to:: Patient Represenative (must comment) Choice offered to / list presented to : Adult Children  Discharge Placement                Patient chooses bed at:  [facility]) Patient to be transferred to facility by: EMS Name of family member notified: son Patient and family notified of of transfer: 01/04/23  Discharge Plan and Services Additional resources added to the After Visit Summary for                                       Social Determinants of Health (SDOH) Interventions SDOH Screenings   Food Insecurity: No Food Insecurity (01/01/2023)  Housing: Low Risk  (01/01/2023)  Transportation Needs: No Transportation Needs (01/01/2023)  Utilities: Not At Risk (01/01/2023)  Depression (PHQ2-9): Low Risk  (04/19/2022)  Tobacco Use: High Risk (12/31/2022)     Readmission Risk Interventions    01/01/2023    2:18 PM  Readmission Risk Prevention Plan  Post Dischage Appt Not Complete  Medication Screening Complete  Transportation Screening Complete

## 2023-01-04 NOTE — Progress Notes (Signed)
Patients son Katherine Middleton picked up patients home medication stored in pharmacy at 1620 today.

## 2023-01-08 ENCOUNTER — Ambulatory Visit: Payer: Medicare PPO | Admitting: Orthopedic Surgery

## 2023-01-15 ENCOUNTER — Ambulatory Visit (INDEPENDENT_AMBULATORY_CARE_PROVIDER_SITE_OTHER): Payer: Medicare PPO | Admitting: Orthopedic Surgery

## 2023-01-15 ENCOUNTER — Ambulatory Visit (INDEPENDENT_AMBULATORY_CARE_PROVIDER_SITE_OTHER): Payer: Medicare PPO

## 2023-01-15 ENCOUNTER — Encounter: Payer: Self-pay | Admitting: Orthopedic Surgery

## 2023-01-15 VITALS — Ht 64.0 in | Wt 102.0 lb

## 2023-01-15 DIAGNOSIS — S42202A Unspecified fracture of upper end of left humerus, initial encounter for closed fracture: Secondary | ICD-10-CM

## 2023-01-15 NOTE — Progress Notes (Signed)
Orthopaedic Clinic Return  Assessment: Katherine Middleton is a 72 y.o. LHD female with the following: Left proximal humerus fracture   Plan: Katherine Middleton has a left proximal humerus fracture.  Radiographs obtained today are stable.  No interval displacement.  Continue with nonoperative management.  Injury was sustained 3 weeks ago.  She is to remain in sling at all times for another week.  After that, she can remove the sling to initiate gentle range of motion of the elbow, wrist and hand.  Okay to initiate pendulums in 1 week time.  I will see her back in 3 weeks for repeat evaluation.  Continue with medications as needed.   Follow-up: Return in about 3 weeks (around 02/05/2023).   Subjective:  Chief Complaint  Patient presents with   Left shoulder pain    Fall 12/25/22    History of Present Illness: Katherine Middleton is a 72 y.o. female who returns to clinic for evaluation of left shoulder pain.  I saw her in the hospital as a consult.  She fell on 12/25/2022, and sustained a left proximal humerus fracture.  She has since been admitted to a rehab facility.  She is doing okay.  She continues to have pain in the shoulder, but swelling, bruising and other issues are improving.  She has remained in her sling.  No motion.  Review of Systems: No fevers or chills No numbness or tingling No chest pain No shortness of breath No bowel or bladder dysfunction No GI distress No headaches   Objective: Ht '5\' 4"'$  (1.626 m)   Wt 102 lb (46.3 kg)   BMI 17.51 kg/m   Physical Exam:  Elderly female.  Seated in a wheelchair.  Left shoulder with minimal swelling.  She does have bruising and swelling of the distal arm.  Fingers are warm and well-perfused.  Sensation intact in the axillary nerve distribution.  Sensation is intact throughout the left hand.  2+ radial pulse.  IMAGING: I personally ordered and reviewed the following images:  X-rays of the left shoulder were obtained in clinic  today.  There is an oblique fracture through the surgical neck of the proximal humerus.  Glenohumeral joint is reduced.  There has been minimal interval displacement through the fracture.  No bony lesions.  Impression: Left proximal humerus fracture in stable alignment   Mordecai Rasmussen, MD 01/15/2023 2:19 PM

## 2023-01-15 NOTE — Patient Instructions (Signed)
Pendulum   Stand near a wall or a surface that you can hold onto for balance. Bend at the waist and let your left / right arm hang straight down. Use your other arm to support you. Keep your back straight and do not lock your knees. Relax your left / right arm and shoulder muscles, and move your hips and your trunk so your left / right arm swings freely. Your arm should swing because of the motion of your body, not because you are using your arm or shoulder muscles. Keep moving your hips and trunk so your arm swings in the following directions, as told by your health care provider: Side to side. Forward and backward. In clockwise and counterclockwise circles. Continue each motion for 20 seconds, or for as long as told by your health care provider. Slowly return to the starting position.  Repeat 10 times. Complete this exercise daily.

## 2023-02-04 ENCOUNTER — Ambulatory Visit (INDEPENDENT_AMBULATORY_CARE_PROVIDER_SITE_OTHER): Payer: Medicare PPO

## 2023-02-04 ENCOUNTER — Encounter: Payer: Self-pay | Admitting: Orthopedic Surgery

## 2023-02-04 ENCOUNTER — Ambulatory Visit (INDEPENDENT_AMBULATORY_CARE_PROVIDER_SITE_OTHER): Payer: Medicare PPO | Admitting: Orthopedic Surgery

## 2023-02-04 DIAGNOSIS — S42202D Unspecified fracture of upper end of left humerus, subsequent encounter for fracture with routine healing: Secondary | ICD-10-CM

## 2023-02-04 NOTE — Patient Instructions (Signed)
Can initiate passive range of motion of the left shoulder.   Work to gain motion in the left elbow.   Can start pendulum exercises   Pendulum   Stand near a wall or a surface that you can hold onto for balance. Bend at the waist and let your left / right arm hang straight down. Use your other arm to support you. Keep your back straight and do not lock your knees. Relax your left / right arm and shoulder muscles, and move your hips and your trunk so your left / right arm swings freely. Your arm should swing because of the motion of your body, not because you are using your arm or shoulder muscles. Keep moving your hips and trunk so your arm swings in the following directions, as told by your health care provider: Side to side. Forward and backward. In clockwise and counterclockwise circles. Continue each motion for 20 seconds, or for as long as told by your health care provider. Slowly return to the starting position.  Repeat 10 times. Complete this exercise daily.

## 2023-02-04 NOTE — Progress Notes (Signed)
Orthopaedic Clinic Return  Assessment: Katherine Middleton is a 72 y.o. female with the following: Left proximal humerus fracture   Plan: [name] sustained a left proximal humerus fracture, a little over 6 weeks ago.  I saw her as a consult in the hospital.  She was discharged from the hospital to a nursing facility.  She has been home for about a week.  Radiographs obtained today demonstrates consolidation, without further displacement.  Given the chronicity of her injury, it is time to initiate range of motion.  Thankfully, she is working with home health at home, and they can initiate passive range of motion of the left shoulder.  Pendulum exercises were described.  I also demonstrated some exercises that she can start immediately.  I would like to see her back in approximate 4 weeks for repeat evaluation.  Follow-up: Return in about 4 weeks (around 03/04/2023).   Subjective:  Chief Complaint  Patient presents with   Shoulder Injury    LT shoulder fracture DOI 12/25/2022    History of Present Illness: Katherine Middleton is a 72 y.o. female who presents to clinic for repeat evaluation of left shoulder pain.  I saw her as a consult in the hospital a couple of weeks ago.  She sustained a fall just before Christmas.  It has been a little over 6 weeks since the initial injury.  She has remained in a sling.  Her pain is much better.  She was discharged from the hospital to a nursing facility.  She has since returned home.  She has started to work with home health physical therapy.  She has some stiffness in her elbow.  No numbness or tingling.  Review of Systems: No fevers or chills Occasional numbness and tingling No chest pain No shortness of breath No bowel or bladder dysfunction No GI distress No headaches   Objective: There were no vitals taken for this visit.  Physical Exam:  Alert and oriented.  No acute distress.  No swelling, bruising or deformity of the left  shoulder.  Mild tenderness to palpation of the proximal humerus.  Tolerates gentle range of motion.  Forward flexion limited to 70 degrees.  75 degrees of abduction.  Limited external rotation due to pain.  She is unable to fully extend her elbow.  IMAGING: I personally ordered and reviewed the following images:  X-rays of the left shoulder were obtained in clinic today.  No acute injuries are noted.  There has been no interval displacement of the surgical neck fracture.  Glenohumeral joint is reduced.  There has been interval consolidation.  No bony lesions.  Impression: Healed left proximal humerus fracture   Mordecai Rasmussen, MD 02/04/2023 3:33 PM

## 2023-02-05 ENCOUNTER — Encounter: Payer: Medicare PPO | Admitting: Orthopedic Surgery

## 2023-02-08 ENCOUNTER — Telehealth: Payer: Self-pay | Admitting: Orthopedic Surgery

## 2023-02-08 DIAGNOSIS — S42202D Unspecified fracture of upper end of left humerus, subsequent encounter for fracture with routine healing: Secondary | ICD-10-CM

## 2023-02-08 NOTE — Telephone Encounter (Signed)
[place] therapist w/[facility] [phone])  lvm requesting order from Dr. Amedeo Kinsman and clarify his protocol.

## 2023-02-13 NOTE — Telephone Encounter (Signed)
Order faxed.

## 2023-02-18 ENCOUNTER — Telehealth: Payer: Self-pay | Admitting: Orthopedic Surgery

## 2023-02-18 NOTE — Telephone Encounter (Signed)
Juliann Mule, director at [employer] t.  [phone] calling regarding this patient.  Needs to report an issue with the patient and the therapist about possibly doing some range of motion that wasn't ordered yet.  [phone]

## 2023-02-20 NOTE — Telephone Encounter (Signed)
Spoke with Katherine Middleton and let him know pt can proceed with care she's receiving as long as she's not in any distress.

## 2023-02-27 ENCOUNTER — Encounter (HOSPITAL_COMMUNITY): Payer: Self-pay | Admitting: Psychiatry

## 2023-02-27 ENCOUNTER — Telehealth (HOSPITAL_COMMUNITY): Payer: Medicare PPO | Admitting: Psychiatry

## 2023-02-27 DIAGNOSIS — F319 Bipolar disorder, unspecified: Secondary | ICD-10-CM

## 2023-02-27 MED ORDER — TRAZODONE HCL 150 MG PO TABS: 75.0000 mg | ORAL_TABLET | Freq: Every day | ORAL | 2 refills | Status: DC

## 2023-02-27 MED ORDER — ALPRAZOLAM 0.25 MG PO TABS: 0.2500 mg | ORAL_TABLET | Freq: Two times a day (BID) | ORAL | 2 refills | Status: DC | PRN

## 2023-02-27 MED ORDER — GABAPENTIN 300 MG PO CAPS: 300.0000 mg | ORAL_CAPSULE | Freq: Two times a day (BID) | ORAL | 3 refills | Status: DC

## 2023-02-27 MED ORDER — PAROXETINE HCL 40 MG PO TABS: 40.0000 mg | ORAL_TABLET | Freq: Every morning | ORAL | 3 refills | Status: DC

## 2023-02-27 NOTE — Progress Notes (Addendum)
Virtual Visit via Telephone Note  I connected with Katherine Middleton on 03/01/23 at  2:40 PM EST by telephone and verified that I am speaking with the correct person using two identifiers.  Location: Patient: home Provider: office   I discussed the limitations, risks, security and privacy concerns of performing an evaluation and management service by telephone and the availability of in person appointments. I also discussed with the patient that there may be a patient responsible charge related to this service. The patient expressed understanding and agreed to proceed.      I discussed the assessment and treatment plan with the patient. The patient was provided an opportunity to ask questions and all were answered. The patient agreed with the plan and demonstrated an understanding of the instructions.   The patient was advised to call back or seek an in-person evaluation if the symptoms worsen or if the condition fails to improve as anticipated.  I provided 20 minutes of non-face-to-face time during this encounter.   Levonne Spiller, MD  [facility] MD/PA/NP OP Progress Note  02/27/2023 2:58 PM Katherine Middleton  MRN:  [id number]  Chief Complaint:  Chief Complaint  Patient presents with   Anxiety   Depression   Follow-up   HPI:  Patient is 72 year-old widowed Caucasian female who lives alone in [place]. . She is on disability for fibromyalgia arthritis and bipolar disorder.   The patient has been seeing a psychiatrist for years. Initially she was diagnosed with depression but eventually she developed some manic symptoms and now is designated as having bipolar disorder. She's often very tired due to her fibromyalgia and has chronic muscle aches and pains. She feels that the medicines she is on now been very helpful. She sleeping well and her mood is stable. She's not using drugs or alcohol denies suicidal ideation. She's never been psychotic but mentions having  "breakdown" about 10 years  ago. She's never been hospitalized  The patient returns for follow-up after 3 months.  Unfortunately she suffered a fall right after Christmas and broke her arm.  She then had another fall a few days later and was admitted to the hospital because of instability and generalized weakness.  After the hospitalization she was sent to a rehab center to get PT.  She is now getting home PT.  She states she is feeling a lot better and her arm is healing and she is doing her exercises.  She is eating better and has gained 5 pounds and now weighs 103 pounds.  She is still on the Creon for now.  Her son is very concerned that the psychiatric medications might be causing her instability.  It rather than contacting me however he has been working with her primary physician's office and instructed the PA that he wanted to cut them back.  Her Xanax is now down to 0.25 mg twice daily, gabapentin is down to 300 mg twice daily and trazodone to 75 mg at bedtime.  She has adjusted well to so far to all of these changes and has no longer been falling.  I am in agreement with this but I wish that they had contacted me rather than going through primary care about her psychiatric medicines. Visit Diagnosis:    ICD-10-CM   1. Bipolar 1 disorder (HCC)  F31.9 traZODone (DESYREL) 150 MG tablet    PARoxetine (PAXIL) 40 MG tablet      Past Psychiatric History: Long-term outpatient treatment  Past Medical History:  Past  Medical History:  Diagnosis Date   Anxiety    Arthritis    Bipolar disorder ([place])    Depression    Fibromyalgia    GERD (gastroesophageal reflux disease) 12/31/2002   HLD (hyperlipidemia)    diet controlled - no meds   HSV infection    Liver mass 07/2021   Osteoarthritis    Prolonged Q-T interval on ECG 02/2021   Sinusitis 12/31/2012   Wears glasses     Past Surgical History:  Procedure Laterality Date   BIOPSY  08/24/2021   Procedure: BIOPSY;  Surgeon: Irving Copas., MD;  Location: [place];  Service: Gastroenterology;;   BIOPSY  09/11/2021   Procedure: BIOPSY;  Surgeon: Irving Copas., MD;  Location: [facility];  Service: Gastroenterology;;   ESOPHAGOGASTRODUODENOSCOPY (EGD) WITH PROPOFOL N/A 08/11/2021   Procedure: ESOPHAGOGASTRODUODENOSCOPY (EGD) WITH PROPOFOL;  Surgeon: Carol Ada, MD;  Location: [facility];  Service: Endoscopy;  Laterality: N/A;   ESOPHAGOGASTRODUODENOSCOPY (EGD) WITH PROPOFOL N/A 08/24/2021   Procedure: ESOPHAGOGASTRODUODENOSCOPY (EGD) WITH PROPOFOL;  Surgeon: Rush Landmark Telford Nab., MD;  Location: [place];  Service: Gastroenterology;  Laterality: N/A;   ESOPHAGOGASTRODUODENOSCOPY (EGD) WITH PROPOFOL N/A 09/11/2021   Procedure: ESOPHAGOGASTRODUODENOSCOPY (EGD) WITH PROPOFOL;  Surgeon: Rush Landmark Telford Nab., MD;  Location: [facility];  Service: Gastroenterology;  Laterality: N/A;   EUS N/A 08/24/2021   Procedure: UPPER ENDOSCOPIC ULTRASOUND (EUS) LINEAR;  Surgeon: Irving Copas., MD;  Location: [place];  Service: Gastroenterology;  Laterality: N/A;   EUS N/A 09/11/2021   Procedure: UPPER ENDOSCOPIC ULTRASOUND (EUS) LINEAR;  Surgeon: Irving Copas., MD;  Location: [facility];  Service: Gastroenterology;  Laterality: N/A;   FINE NEEDLE ASPIRATION  08/24/2021   Procedure: FINE NEEDLE ASPIRATION;  Surgeon: Rush Landmark Telford Nab., MD;  Location: [place];  Service: Gastroenterology;;   FINE NEEDLE ASPIRATION  09/11/2021   Procedure: FINE NEEDLE ASPIRATION (FNA) LINEAR;  Surgeon: Irving Copas., MD;  Location: [facility];  Service: Gastroenterology;;  Delaine Lame. not in system to charge   TONSILLECTOMY  04/08/57   some date around then   TUBAL LIGATION  10/21/1984   UPPER ESOPHAGEAL ENDOSCOPIC ULTRASOUND (EUS) N/A 08/11/2021   Procedure: UPPER ESOPHAGEAL ENDOSCOPIC ULTRASOUND (EUS);  Surgeon: Carol Ada, MD;  Location: [facility];  Service: Endoscopy;  Laterality: N/A;    Family Psychiatric  History: See below  Family History:  Family History  Problem Relation Age of Onset   Bipolar disorder Sister    Anxiety disorder Sister    Dementia Sister 56   Paranoid behavior Sister    Bipolar disorder Father    Schizophrenia Father    Alcohol abuse Maternal Uncle    Alcohol abuse Paternal Uncle    Anxiety disorder Mother    ADD / ADHD Other    ADD / ADHD Other    Healthy Other    OCD Neg Hx    Seizures Neg Hx    Sexual abuse Neg Hx    Physical abuse Neg Hx     Social History:  Social History   Socioeconomic History   Marital status: Married    Spouse name: Not on file   Number of children: Not on file   Years of education: Not on file   Highest education level: Not on file  Occupational History   Not on file  Tobacco Use   Smoking status: Some Days    Packs/day: 0.50    Years: 30.00    Total pack years: 15.00    Types:  Cigarettes   Smokeless tobacco: Never   Tobacco comments:    11-15 cigs a day as of 05/01/2013  Vaping Use   Vaping Use: Never used  Substance and Sexual Activity   Alcohol use: Yes    Comment: pts son reports several drinks throughout the week   Drug use: No   Sexual activity: Yes    Partners: Male    Birth control/protection: Post-menopausal  Other Topics Concern   Not on file  Social History Narrative   Left handed    Lives alone    Social Determinants of Health   Financial Resource Strain: Not on file  Food Insecurity: No Food Insecurity (01/01/2023)   Hunger Vital Sign    Worried About Running Out of Food in the Last Year: Never true    Ran Out of Food in the Last Year: Never true  Transportation Needs: No Transportation Needs (01/01/2023)   PRAPARE - Hydrologist (Medical): No    Lack of Transportation (Non-Medical): No  Physical Activity: Not on file  Stress: Not on file  Social Connections: Not on file    Allergies:  Allergies  Allergen Reactions   Amoxicillin-Pot Clavulanate Anaphylaxis and  Swelling   Cephalosporins Anaphylaxis    Other reaction(s): Unknown   Codeine Itching   Cymbalta [Duloxetine Hcl] Other (See Comments)    GERD   Duloxetine     Other reaction(s): Other (See Comments), Unknown GERD    Oxycodone Itching and Other (See Comments)    "OUT THERE", climbing the walls   Penicillins Anaphylaxis and Swelling    Throat swelling Reaction: 8-10 years ago   Baclofen Other (See Comments)    Anxiety got much worse and possibly ppted manic episode Other reaction(s): Unknown   Povidone Iodine Itching and Other (See Comments)    Particularly if in a douche  Other reaction(s): Unknown   Flonase [Fluticasone]     Makes eye pressure to increase   Propoxyphene Itching    Anxiety/jittery    Sulfa Antibiotics Swelling   Sulfasalazine Swelling    Other reaction(s): Unknown   Tramadol     Other reaction(s): Other (See Comments), Unknown Patient states that this makes her nervous    Amitriptyline Other (See Comments)    Caused bowels to empty quickly and couldn't sleep on it. Other reaction(s): Other (See Comments), Unknown Caused bowels to empty quickly and couldn't sleep on it. Caused bowels to empty quickly and couldn't sleep on it. Caused bowels to empty quickly and couldn't sleep on it.    Aspirin Hives and Rash   Latex Swelling and Other (See Comments)    If in the mouth, it swells   Nsaids Nausea Only and Other (See Comments)    Almost passing out, bad acid reflux Other reaction(s): Other (See Comments) other   Tolmetin Nausea Only    Other reaction(s): Other (See Comments), Unknown Almost passing out, bad acid reflux Almost passing out, bad acid reflux     Metabolic Disorder Labs: No results found for: "HGBA1C", "MPG" No results found for: "PROLACTIN" No results found for: "CHOL", "TRIG", "HDL", "CHOLHDL", "VLDL", "LDLCALC" Lab Results  Component Value Date   TSH 2.099 01/01/2023    Therapeutic Level Labs: No results found for:  "LITHIUM" No results found for: "VALPROATE" No results found for: "CBMZ"  Current Medications: Current Outpatient Medications  Medication Sig Dispense Refill   ALPRAZolam (XANAX) 0.25 MG tablet Take 1 tablet (0.25 mg total) by mouth 2 (  two) times daily as needed for anxiety. 60 tablet 2   acetaminophen (TYLENOL) 325 MG tablet Take 2 tablets (650 mg total) by mouth 4 (four) times daily.     ALPRAZolam (XANAX) 0.5 MG tablet Take 1 tablet (0.5 mg total) by mouth 3 (three) times daily as needed for anxiety. 12 tablet 0   amLODipine (NORVASC) 5 MG tablet Take 1 tablet (5 mg total) by mouth daily.     cetirizine (ZYRTEC) 10 MG tablet Take 10 mg by mouth at bedtime.     Cholecalciferol (VITAMIN D3) 125 MCG (5000 UT) TABS Take 5,000 Units by mouth daily.     feeding supplement (ENSURE ENLIVE / ENSURE PLUS) LIQD Take 237 mLs by mouth 2 (two) times daily between meals. 237 mL 12   gabapentin (NEURONTIN) 300 MG capsule Take 1 capsule (300 mg total) by mouth 2 (two) times daily. 180 capsule 3   ipratropium-albuterol (DUONEB) 0.5-2.5 (3) MG/3ML SOLN Take 3 mLs by nebulization every 4 (four) hours as needed. 360 mL    Multiple Vitamin (MULTIVITAMIN WITH MINERALS) TABS tablet Take 1 tablet by mouth daily.     omeprazole (PRILOSEC) 20 MG capsule Take 20 mg by mouth daily.      Pancrelipase, Lip-Prot-Amyl, (CREON) 24000-76000 units CPEP Take 2 capsules with each meal, 1 after starting/1 when finished eating, and 1 capsule with snack after starting to eat.     PARoxetine (PAXIL) 40 MG tablet Take 1 tablet (40 mg total) by mouth every morning. 90 tablet 3   Polyethyl Glyc-Propyl Glyc PF (SYSTANE ULTRA PF) 0.4-0.3 % SOLN Place 1 drop into both eyes daily as needed (Dry eye).     sodium chloride (OCEAN) 0.65 % SOLN nasal spray Place 1 spray into both nostrils as needed for congestion.     tiZANidine (ZANAFLEX) 4 MG tablet Take 4 mg by mouth every 8 (eight) hours as needed (muscle pain).     traZODone (DESYREL) 150  MG tablet Take 0.5 tablets (75 mg total) by mouth at bedtime. 90 tablet 2   vitamin B-12 (CYANOCOBALAMIN) 1000 MCG tablet Take 1,000 mcg by mouth daily.     No current facility-administered medications for this visit.     Musculoskeletal: Strength & Muscle Tone: na Gait & Station: na Patient leans: N/A  Psychiatric Specialty Exam: Review of Systems  Constitutional:  Positive for unexpected weight change.  Musculoskeletal:  Positive for gait problem.  Neurological:  Positive for weakness.  All other systems reviewed and are negative.   There were no vitals taken for this visit.There is no height or weight on file to calculate BMI.  General Appearance: NA  Eye Contact:  NA  Speech:  Clear and Coherent  Volume:,nl  Mood:  Euthymic  Affect:  na  Thought Process:  Goal Directed  Orientation:  Full (Time, Place, and Person)  Thought Content: WDL   Suicidal Thoughts:  No  Homicidal Thoughts:  No  Memory:  Immediate;   Good Recent;   Good Remote;   Fair  Judgement:  Good  Insight:  Fair  Psychomotor Activity:  Decreased  Concentration:  Concentration: Fair and Attention Span: Fair  Recall:  Good  Fund of Knowledge: Good  Language: Good  Akathisia:  No  Handed:  Right  AIMS (if indicated): not done  Assets:  Communication Skills Desire for Improvement Resilience Social Support  ADL's:  Intact  Cognition: WNL  Sleep:  Good   Screenings: PHQ2-9    Flowsheet Row Video Visit from  04/19/2022 in [facility] at [place] Video Visit from 01/24/2022 in [facility] at [place] Video Visit from 10/25/2021 in [facility] [place] Video Visit from 07/25/2021 in [place] at [place] Video Visit from 05/02/2021 in [place] at [facility] Total Score 0 0 0 0 0      Flowsheet Row ED to Hosp-Admission (Discharged) from  12/31/2022 in [facility] ED from 12/25/2022 in [facility] Emergency Department at [facility] Video Visit from 04/19/2022 in [place] at [place] No Risk No Risk No Risk        Assessment and Plan: This patient is a 72 year old female with a history of bipolar disorder pancreatic mass recent falls and instability.  She obviously is becoming more frail.  I am agreement with cutting down her medications but I would rather do it through our office.  She is going to inform her son of this.  Will continue Paxil 40 mg daily for depression, gabapentin has been reduced to 300 mg twice daily for anxiety and fibromyalgia, Xanax has been reduced to 0.25 mg twice daily as needed for anxiety and trazodone reduced to 75 mg at bedtime for sleep.  She will return to see me in 3 months  Collaboration of Care: Collaboration of Care: Primary Care Provider AEB notes are shared to PCP on the epic system  Patient/Guardian was advised Release of Information must be obtained prior to any record release in order to collaborate their care with an outside provider. Patient/Guardian was advised if they have not already done so to contact the registration department to sign all necessary forms in order for [place] to release information regarding their care.   Consent: Patient/Guardian gives verbal consent for treatment and assignment of benefits for services provided during this visit. Patient/Guardian expressed understanding and agreed to proceed.    Levonne Spiller, MD 02/27/2023, 2:58 PM

## 2023-02-28 ENCOUNTER — Encounter: Payer: Self-pay | Admitting: Radiology

## 2023-03-05 ENCOUNTER — Ambulatory Visit (INDEPENDENT_AMBULATORY_CARE_PROVIDER_SITE_OTHER): Payer: Medicare PPO | Admitting: Orthopedic Surgery

## 2023-03-05 ENCOUNTER — Ambulatory Visit (INDEPENDENT_AMBULATORY_CARE_PROVIDER_SITE_OTHER): Payer: Medicare PPO

## 2023-03-05 ENCOUNTER — Encounter: Payer: Self-pay | Admitting: Orthopedic Surgery

## 2023-03-05 DIAGNOSIS — S42202D Unspecified fracture of upper end of left humerus, subsequent encounter for fracture with routine healing: Secondary | ICD-10-CM | POA: Diagnosis not present

## 2023-03-05 NOTE — Patient Instructions (Signed)
Continue to progress with physical therapy  Please call if you have any questions or concerns

## 2023-03-05 NOTE — Progress Notes (Signed)
Orthopaedic Clinic Return  Assessment: [name] is a 72 y.o. female with the following: Left proximal humerus fracture   Plan: [name] well.  Radiographs are stable.  Her motion is improving.  She has been working with physical therapy.  Provided reassurance.  Advised her to continue working diligently on her range of motion, as well as function.  She states her understanding.  She will contact clinic if she has issues.  Follow-up as needed.  Follow-up: Return if symptoms worsen or fail to improve.   Subjective:  Chief Complaint  Patient presents with   Fracture    Lt humerus DOI 12/25/22    History of Present Illness: Katherine Middleton is a 72 y.o. female who returns to clinic for repeat evaluation of left shoulder pain.  She sustained a left proximal humerus fracture greater than 2 months ago.  She is out of the sling.  She is working with therapy.  Her pain is better.  She notes improved function.  No numbness or tingling.  Review of Systems: No fevers or chills Occasional numbness and tingling No chest pain No shortness of breath No bowel or bladder dysfunction No GI distress No headaches   Objective: There were no vitals taken for this visit.  Physical Exam:  Alert and oriented.  No acute distress.  Left shoulder without swelling.  No bruising.  No tenderness to palpation.  She tolerates forward flexion to 110 degrees.  Abduction to 80 degrees.  External rotation is approximate 50 degrees at her side.  Fingers are warm and well-perfused.  IMAGING: I personally ordered and reviewed the following images:  X-rays of the left shoulder were obtained in clinic today.  These are compared to prior x-rays.  There are no new injuries.  Surgical neck fracture with some and traction is identified.  Glenohumeral joint is reduced.  No proximal humeral migration.  Impression: Left proximal humerus fracture in stable alignment.   Mordecai Rasmussen,  MD 03/05/2023 3:14 PM

## 2023-05-28 ENCOUNTER — Telehealth (INDEPENDENT_AMBULATORY_CARE_PROVIDER_SITE_OTHER): Payer: Medicare HMO | Admitting: Psychiatry

## 2023-05-28 ENCOUNTER — Encounter (HOSPITAL_COMMUNITY): Payer: Self-pay | Admitting: Psychiatry

## 2023-05-28 DIAGNOSIS — F319 Bipolar disorder, unspecified: Secondary | ICD-10-CM | POA: Diagnosis not present

## 2023-05-28 MED ORDER — GABAPENTIN 300 MG PO CAPS: 300.0000 mg | ORAL_CAPSULE | Freq: Two times a day (BID) | ORAL | 3 refills | Status: DC

## 2023-05-28 MED ORDER — ALPRAZOLAM 0.25 MG PO TABS: 0.2500 mg | ORAL_TABLET | Freq: Three times a day (TID) | ORAL | 3 refills | Status: DC

## 2023-05-28 MED ORDER — PAROXETINE HCL 40 MG PO TABS: 40.0000 mg | ORAL_TABLET | Freq: Every morning | ORAL | 3 refills | Status: DC

## 2023-05-28 MED ORDER — TRAZODONE HCL 150 MG PO TABS
75.0000 mg | ORAL_TABLET | Freq: Every day | ORAL | 2 refills | Status: DC
Start: 2023-05-28 — End: 2023-10-07

## 2023-05-28 NOTE — Progress Notes (Signed)
Virtual Visit via Telephone Note  I connected with Katherine Middleton on 05/28/23 at  1:40 PM EDT by telephone and verified that I am speaking with the correct person using two identifiers.  Location: Patient: home Provider: office   I discussed the limitations, risks, security and privacy concerns of performing an evaluation and management service by telephone and the availability of in person appointments. I also discussed with the patient that there may be a patient responsible charge related to this service. The patient expressed understanding and agreed to proceed.       I discussed the assessment and treatment plan with the patient. The patient was provided an opportunity to ask questions and all were answered. The patient agreed with the plan and demonstrated an understanding of the instructions.   The patient was advised to call back or seek an in-person evaluation if the symptoms worsen or if the condition fails to improve as anticipated.  I provided 15 minutes of non-face-to-face time during this encounter.   Katherine Ruder, MD  [facility] MD/PA/NP OP Progress Note  05/28/2023 1:59 PM Katherine Middleton  MRN:  [id number]  Chief Complaint:  Chief Complaint  Patient presents with   Depression   Anxiety   Follow-up   HPI:   Patient is 72 year-old widowed Caucasian female who lives alone in [place]. . She is on disability for fibromyalgia arthritis and bipolar disorder.   The patient has been seeing a psychiatrist for years. Initially she was diagnosed with depression but eventually she developed some manic symptoms and now is designated as having bipolar disorder. She's often very tired due to her fibromyalgia and has chronic muscle aches and pains. She feels that the medicines she is on now been very helpful. She sleeping well and her mood is stable. She's not using drugs or alcohol denies suicidal ideation. She's never been psychotic but mentions having  "breakdown" about 10 years  ago. She's never been hospitalized  The patient returns for follow-up after 3 months.  She is doing better in some respects.  She is using the pancreatic enzymes and has gained about 12 pounds.  She is feeling better and stronger.  She has not had any further falls.  She is finally healed from breaking her arm a few months ago but now has trigger finger in the other side hand.  She feels much stronger.  She is sleeping well.  She asked if she could have 1 more tablet of Xanax 0.25 mg/day as a to just is not lasting and I think this is reasonable.  She is on much lower dosages of medications as her son does not want her to fall and so far she has been doing okay in terms of mood.  She is sleeping well.  She is definitely eating better   Visit Diagnosis:    ICD-10-CM   1. Bipolar 1 disorder (HCC)  F31.9 traZODone (DESYREL) 150 MG tablet    PARoxetine (PAXIL) 40 MG tablet      Past Psychiatric History: Long-term outpatient treatment  Past Medical History:  Past Medical History:  Diagnosis Date   Anxiety    Arthritis    Bipolar disorder (HCC)    Depression    Fibromyalgia    GERD (gastroesophageal reflux disease) 12/31/2002   HLD (hyperlipidemia)    diet controlled - no meds   HSV infection    Liver mass 07/2021   Osteoarthritis    Prolonged Q-T interval on ECG 02/2021   Sinusitis  12/31/2012   Wears glasses     Past Surgical History:  Procedure Laterality Date   BIOPSY  08/24/2021   Procedure: BIOPSY;  Surgeon: Meridee Score Netty Starring., MD;  Location: [facility] ENDOSCOPY;  Service: Gastroenterology;;   BIOPSY  09/11/2021   Procedure: BIOPSY;  Surgeon: Lemar Lofty., MD;  Location: [facility] ENDOSCOPY;  Service: Gastroenterology;;   ESOPHAGOGASTRODUODENOSCOPY (EGD) WITH PROPOFOL N/A 08/11/2021   Procedure: ESOPHAGOGASTRODUODENOSCOPY (EGD) WITH PROPOFOL;  Surgeon: Jeani Hawking, MD;  Location: [facility] ENDOSCOPY;  Service: Endoscopy;  Laterality: N/A;   ESOPHAGOGASTRODUODENOSCOPY (EGD) WITH  PROPOFOL N/A 08/24/2021   Procedure: ESOPHAGOGASTRODUODENOSCOPY (EGD) WITH PROPOFOL;  Surgeon: Meridee Score Netty Starring., MD;  Location: [facility] ENDOSCOPY;  Service: Gastroenterology;  Laterality: N/A;   ESOPHAGOGASTRODUODENOSCOPY (EGD) WITH PROPOFOL N/A 09/11/2021   Procedure: ESOPHAGOGASTRODUODENOSCOPY (EGD) WITH PROPOFOL;  Surgeon: Meridee Score Netty Starring., MD;  Location: [facility] ENDOSCOPY;  Service: Gastroenterology;  Laterality: N/A;   EUS N/A 08/24/2021   Procedure: UPPER ENDOSCOPIC ULTRASOUND (EUS) LINEAR;  Surgeon: Lemar Lofty., MD;  Location: [facility] ENDOSCOPY;  Service: Gastroenterology;  Laterality: N/A;   EUS N/A 09/11/2021   Procedure: UPPER ENDOSCOPIC ULTRASOUND (EUS) LINEAR;  Surgeon: Lemar Lofty., MD;  Location: [facility] ENDOSCOPY;  Service: Gastroenterology;  Laterality: N/A;   FINE NEEDLE ASPIRATION  08/24/2021   Procedure: FINE NEEDLE ASPIRATION;  Surgeon: Meridee Score Netty Starring., MD;  Location: [facility] ENDOSCOPY;  Service: Gastroenterology;;   FINE NEEDLE ASPIRATION  09/11/2021   Procedure: FINE NEEDLE ASPIRATION (FNA) LINEAR;  Surgeon: Lemar Lofty., MD;  Location: [facility] ENDOSCOPY;  Service: Gastroenterology;;  [name]. not in system to charge   TONSILLECTOMY  04/08/57   some date around then   TUBAL LIGATION  10/21/1984   UPPER ESOPHAGEAL ENDOSCOPIC ULTRASOUND (EUS) N/A 08/11/2021   Procedure: UPPER ESOPHAGEAL ENDOSCOPIC ULTRASOUND (EUS);  Surgeon: Jeani Hawking, MD;  Location: [facility] ENDOSCOPY;  Service: Endoscopy;  Laterality: N/A;    Family Psychiatric History: See below  Family History:  Family History  Problem Relation Age of Onset   Bipolar disorder Sister    Anxiety disorder Sister    Dementia Sister 15   Paranoid behavior Sister    Bipolar disorder Father    Schizophrenia Father    Alcohol abuse Maternal Uncle    Alcohol abuse Paternal Uncle    Anxiety disorder Mother    ADD / ADHD Other    ADD / ADHD Other    Healthy Other    OCD Neg Hx    Seizures Neg Hx     Sexual abuse Neg Hx    Physical abuse Neg Hx     Social History:  Social History   Socioeconomic History   Marital status: Married    Spouse name: Not on file   Number of children: Not on file   Years of education: Not on file   Highest education level: Not on file  Occupational History   Not on file  Tobacco Use   Smoking status: Some Days    Packs/day: 0.50    Years: 30.00    Additional pack years: 0.00    Total pack years: 15.00    Types: Cigarettes   Smokeless tobacco: Never   Tobacco comments:    11-15 cigs a day as of 05/01/2013  Vaping Use   Vaping Use: Never used  Substance and Sexual Activity   Alcohol use: Yes    Comment: pts son reports several drinks throughout the week   Drug use: No   Sexual activity: Yes  Partners: Male    Birth control/protection: Post-menopausal  Other Topics Concern   Not on file  Social History Narrative   Left handed    Lives alone    Social Determinants of Health   Financial Resource Strain: Not on file  Food Insecurity: No Food Insecurity (01/01/2023)   Hunger Vital Sign    Worried About Running Out of Food in the Last Year: Never true    Ran Out of Food in the Last Year: Never true  Transportation Needs: No Transportation Needs (01/01/2023)   PRAPARE - Administrator, Civil Service (Medical): No    Lack of Transportation (Non-Medical): No  Physical Activity: Not on file  Stress: Not on file  Social Connections: Not on file    Allergies:  Allergies  Allergen Reactions   Amoxicillin-Pot Clavulanate Anaphylaxis and Swelling   Cephalosporins Anaphylaxis    Other reaction(s): Unknown   Codeine Itching   Cymbalta [Duloxetine Hcl] Other (See Comments)    GERD   Duloxetine     Other reaction(s): Other (See Comments), Unknown GERD    Oxycodone Itching and Other (See Comments)    "OUT THERE", climbing the walls   Penicillins Anaphylaxis and Swelling    Throat swelling Reaction: 8-10 years ago   Baclofen  Other (See Comments)    Anxiety got much worse and possibly ppted manic episode Other reaction(s): Unknown   Povidone Iodine Itching and Other (See Comments)    Particularly if in a douche  Other reaction(s): Unknown   Flonase [Fluticasone]     Makes eye pressure to increase   Propoxyphene Itching    Anxiety/jittery    Sulfa Antibiotics Swelling   Sulfasalazine Swelling    Other reaction(s): Unknown   Tramadol     Other reaction(s): Other (See Comments), Unknown Patient states that this makes her nervous    Amitriptyline Other (See Comments)    Caused bowels to empty quickly and couldn't sleep on it. Other reaction(s): Other (See Comments), Unknown Caused bowels to empty quickly and couldn't sleep on it. Caused bowels to empty quickly and couldn't sleep on it. Caused bowels to empty quickly and couldn't sleep on it.    Aspirin Hives and Rash   Latex Swelling and Other (See Comments)    If in the mouth, it swells   Nsaids Nausea Only and Other (See Comments)    Almost passing out, bad acid reflux Other reaction(s): Other (See Comments) other   Tolmetin Nausea Only    Other reaction(s): Other (See Comments), Unknown Almost passing out, bad acid reflux Almost passing out, bad acid reflux     Metabolic Disorder Labs: No results found for: "HGBA1C", "MPG" No results found for: "PROLACTIN" No results found for: "CHOL", "TRIG", "HDL", "CHOLHDL", "VLDL", "LDLCALC" Lab Results  Component Value Date   TSH 2.099 01/01/2023    Therapeutic Level Labs: No results found for: "LITHIUM" No results found for: "VALPROATE" No results found for: "CBMZ"  Current Medications: Current Outpatient Medications  Medication Sig Dispense Refill   acetaminophen (TYLENOL) 325 MG tablet Take 2 tablets (650 mg total) by mouth 4 (four) times daily.     ALPRAZolam (XANAX) 0.25 MG tablet Take 1 tablet (0.25 mg total) by mouth 3 (three) times daily. 90 tablet 3   amLODipine (NORVASC) 5 MG tablet  Take 1 tablet (5 mg total) by mouth daily.     cetirizine (ZYRTEC) 10 MG tablet Take 10 mg by mouth at bedtime.     Cholecalciferol (  VITAMIN D3) 125 MCG (5000 UT) TABS Take 5,000 Units by mouth daily.     feeding supplement (ENSURE ENLIVE / ENSURE PLUS) LIQD Take 237 mLs by mouth 2 (two) times daily between meals. 237 mL 12   gabapentin (NEURONTIN) 300 MG capsule Take 1 capsule (300 mg total) by mouth 2 (two) times daily. 180 capsule 3   ipratropium-albuterol (DUONEB) 0.5-2.5 (3) MG/3ML SOLN Take 3 mLs by nebulization every 4 (four) hours as needed. 360 mL    Multiple Vitamin (MULTIVITAMIN WITH MINERALS) TABS tablet Take 1 tablet by mouth daily.     omeprazole (PRILOSEC) 20 MG capsule Take 20 mg by mouth daily.      Pancrelipase, Lip-Prot-Amyl, (CREON) 24000-76000 units CPEP Take 2 capsules with each meal, 1 after starting/1 when finished eating, and 1 capsule with snack after starting to eat.     PARoxetine (PAXIL) 40 MG tablet Take 1 tablet (40 mg total) by mouth every morning. 90 tablet 3   Polyethyl Glyc-Propyl Glyc PF (SYSTANE ULTRA PF) 0.4-0.3 % SOLN Place 1 drop into both eyes daily as needed (Dry eye).     sodium chloride (OCEAN) 0.65 % SOLN nasal spray Place 1 spray into both nostrils as needed for congestion.     tiZANidine (ZANAFLEX) 4 MG tablet Take 4 mg by mouth every 8 (eight) hours as needed (muscle pain).     traZODone (DESYREL) 150 MG tablet Take 0.5 tablets (75 mg total) by mouth at bedtime. 90 tablet 2   vitamin B-12 (CYANOCOBALAMIN) 1000 MCG tablet Take 1,000 mcg by mouth daily.     No current facility-administered medications for this visit.     Musculoskeletal: Strength & Muscle Tone: na Gait & Station: na Patient leans: N/A  Psychiatric Specialty Exam: Review of Systems  Musculoskeletal:  Positive for gait problem.  Psychiatric/Behavioral:  The patient is nervous/anxious.   All other systems reviewed and are negative.   There were no vitals taken for this  visit.There is no height or weight on file to calculate BMI.  General Appearance: NA  Eye Contact:  NA  Speech:  Clear and Coherent  Volume:  Normal  Mood:  Euthymic  Affect:  NA  Thought Process:  Goal Directed  Orientation:  Full (Time, Place, and Person)  Thought Content: WDL   Suicidal Thoughts:  No  Homicidal Thoughts:  No  Memory:  Immediate;   Good Recent;   Good Remote;   NA  Judgement:  Good  Insight:  Fair  Psychomotor Activity:  Decreased  Concentration:  Concentration: Good and Attention Span: Good  Recall:  Good  Fund of Knowledge: Good  Language: Good  Akathisia:  No  Handed:  Right  AIMS (if indicated): not done  Assets:  Communication Skills Desire for Improvement Resilience Social Support Talents/Skills  ADL's:  Intact  Cognition: WNL  Sleep:  Good   Screenings: PHQ2-9    Flowsheet Row Video Visit from 04/19/2022 in [facility] [place] Video Visit from 01/24/2022 in [facility] at [place] Video Visit from 10/25/2021 in [facility] at [place] Video Visit from 07/25/2021 in [facility] at [place] Video Visit from 05/02/2021 in [facility] at [facility] Total Score 0 0 0 0 0      Flowsheet Row ED to Hosp-Admission (Discharged) from 12/31/2022 in [facility] ED from 12/25/2022 in [facility]  Video Visit from 04/19/2022 in [facility] RISK CATEGORY No Risk No Risk No Risk        Assessment and Plan: This patient is a 72 year old female with a history of bipolar disorder pancreatic mass and weight loss.  Fortunately she is gaining weight and seems to be getting stronger.  She does request extra Xanax per day and I think this is reasonable if she can keep it to 0.25 mg 3 times daily for  anxiety.  She will continue Paxil 40 mg daily for depression, gabapentin 300 mg twice daily for anxiety and fibromyalgia, and trazodone 75 mg at bedtime for sleep.  She will return to see me in 3 months  Collaboration of Care: Collaboration of Care: Primary Care Provider AEB notes are shared with PCP on the epic system  Patient/Guardian was advised Release of Information must be obtained prior to any record release in order to collaborate their care with an outside provider. Patient/Guardian was advised if they have not already done so to contact the registration department to sign all necessary forms in order for [place] to release information regarding their care.   Consent: Patient/Guardian gives verbal consent for treatment and assignment of benefits for services provided during this visit. Patient/Guardian expressed understanding and agreed to proceed.    Katherine Ruder, MD 05/28/2023, 1:59 PM

## 2023-06-22 ENCOUNTER — Other Ambulatory Visit (HOSPITAL_COMMUNITY): Payer: Self-pay | Admitting: Psychiatry

## 2023-06-24 ENCOUNTER — Other Ambulatory Visit (HOSPITAL_COMMUNITY): Payer: Self-pay | Admitting: Psychiatry

## 2023-09-16 ENCOUNTER — Other Ambulatory Visit (HOSPITAL_COMMUNITY): Payer: Self-pay | Admitting: Psychiatry

## 2023-09-24 ENCOUNTER — Telehealth (HOSPITAL_COMMUNITY): Payer: Medicare HMO | Admitting: Psychiatry

## 2023-09-24 ENCOUNTER — Encounter (HOSPITAL_COMMUNITY): Payer: Self-pay

## 2023-09-30 ENCOUNTER — Telehealth (HOSPITAL_COMMUNITY): Payer: Medicare HMO | Admitting: Psychiatry

## 2023-10-07 ENCOUNTER — Encounter (HOSPITAL_COMMUNITY): Payer: Self-pay | Admitting: Psychiatry

## 2023-10-07 ENCOUNTER — Telehealth (INDEPENDENT_AMBULATORY_CARE_PROVIDER_SITE_OTHER): Payer: Medicare HMO | Admitting: Psychiatry

## 2023-10-07 DIAGNOSIS — F319 Bipolar disorder, unspecified: Secondary | ICD-10-CM

## 2023-10-07 MED ORDER — GABAPENTIN 300 MG PO CAPS: 300.0000 mg | ORAL_CAPSULE | Freq: Two times a day (BID) | ORAL | 3 refills | Status: DC

## 2023-10-07 MED ORDER — PAROXETINE HCL 40 MG PO TABS
40.0000 mg | ORAL_TABLET | Freq: Every morning | ORAL | 3 refills | Status: DC
Start: 2023-10-07 — End: 2024-01-22

## 2023-10-07 MED ORDER — TRAZODONE HCL 150 MG PO TABS
150.0000 mg | ORAL_TABLET | Freq: Every day | ORAL | 2 refills | Status: DC
Start: 2023-10-07 — End: 2024-01-22

## 2023-10-07 MED ORDER — ALPRAZOLAM 0.25 MG PO TABS: 0.2500 mg | ORAL_TABLET | Freq: Two times a day (BID) | ORAL | 2 refills | Status: DC | PRN

## 2023-10-07 NOTE — Progress Notes (Signed)
Virtual Visit via Video Note  I connected with Katherine Middleton on 10/07/23 at  1:20 PM EDT by a video enabled telemedicine application and verified that I am speaking with the correct person using two identifiers.  Location: Patient: home Provider: office   I discussed the limitations of evaluation and management by telemedicine and the availability of in person appointments. The patient expressed understanding and agreed to proceed.      I discussed the assessment and treatment plan with the patient. The patient was provided an opportunity to ask questions and all were answered. The patient agreed with the plan and demonstrated an understanding of the instructions.   The patient was advised to call back or seek an in-person evaluation if the symptoms worsen or if the condition fails to improve as anticipated.  I provided 20 minutes of non-face-to-face time during this encounter.   Diannia Ruder, MD  [facility] MD/PA/NP OP Progress Note  10/07/2023 1:35 PM Katherine Middleton  MRN:  [id number]  Chief Complaint:  Chief Complaint  Patient presents with   Anxiety   Depression   Follow-up   HPI:   Patient is 72 year-old widowed Caucasian female who lives alone in [place]. . She is on disability for fibromyalgia arthritis and bipolar disorder.   The patient has been seeing a psychiatrist for years. Initially she was diagnosed with depression but eventually she developed some manic symptoms and now is designated as having bipolar disorder. She's often very tired due to her fibromyalgia and has chronic muscle aches and pains. She feels that the medicines she is on now been very helpful. She sleeping well and her mood is stable. She's not using drugs or alcohol denies suicidal ideation. She's never been psychotic but mentions having  "breakdown" about 10 years ago. She's never been hospitalized  The patient returns for follow-up after 3 months.  She states that she has been feeling better.   She has gained up to 112 pounds.  The pancreatic Enzymes seem to be helping.  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  [facility]  She is eating better.  She is sleeping well with the trazodone.  She denies significant depression anxiety thoughts of self-harm or suicide. Visit Diagnosis:    ICD-10-CM   1. Bipolar 1 disorder (HCC)  F31.9 traZODone (DESYREL) 150 MG tablet    PARoxetine (PAXIL) 40 MG tablet      Past Psychiatric History: Long-term outpatient treatment  Past Medical History:  Past Medical History:  Diagnosis Date   Anxiety    Arthritis    Bipolar disorder (HCC)    Depression    Fibromyalgia    GERD (gastroesophageal reflux disease) 12/31/2002   HLD (hyperlipidemia)    diet controlled - no meds   HSV infection    Liver mass 07/2021   Osteoarthritis    Prolonged Q-T interval on ECG 02/2021   Sinusitis 12/31/2012   Wears glasses     Past Surgical History:  Procedure Laterality Date   BIOPSY  08/24/2021   Procedure: BIOPSY;  Surgeon: Lemar Lofty., MD;  Location: [facility] ENDOSCOPY;  Service: Gastroenterology;;   BIOPSY  09/11/2021   Procedure: BIOPSY;  Surgeon: Lemar Lofty., MD;  Location: [facility] ENDOSCOPY;  Service: Gastroenterology;;   ESOPHAGOGASTRODUODENOSCOPY (EGD) WITH PROPOFOL N/A 08/11/2021   Procedure: ESOPHAGOGASTRODUODENOSCOPY (EGD) WITH PROPOFOL;  Surgeon: Jeani Hawking, MD;  Location: [facility] ENDOSCOPY;  Service: Endoscopy;  Laterality: N/A;   ESOPHAGOGASTRODUODENOSCOPY (EGD) WITH PROPOFOL N/A 08/24/2021   Procedure: ESOPHAGOGASTRODUODENOSCOPY (EGD) WITH PROPOFOL;  Surgeon: Meridee Score Netty Starring., MD;  Location: [facility] ENDOSCOPY;  Service: Gastroenterology;  Laterality: N/A;   ESOPHAGOGASTRODUODENOSCOPY (EGD) WITH PROPOFOL N/A 09/11/2021   Procedure: ESOPHAGOGASTRODUODENOSCOPY (EGD) WITH PROPOFOL;  Surgeon: Meridee Score Netty Starring., MD;  Location: [facility] ENDOSCOPY;  Service: Gastroenterology;  Laterality: N/A;   EUS N/A 08/24/2021   Procedure: UPPER ENDOSCOPIC ULTRASOUND (EUS) LINEAR;  Surgeon: Lemar Lofty., MD;  Location: [facility] ENDOSCOPY;  Service: Gastroenterology;  Laterality: N/A;   EUS N/A 09/11/2021   Procedure: UPPER ENDOSCOPIC ULTRASOUND (EUS) LINEAR;  Surgeon: Lemar Lofty., MD;  Location: [facility] ENDOSCOPY;  Service: Gastroenterology;  Laterality: N/A;   FINE NEEDLE ASPIRATION  08/24/2021   Procedure: FINE NEEDLE ASPIRATION;  Surgeon: Meridee Score Netty Starring., MD;  Location: [facility] ENDOSCOPY;  Service: Gastroenterology;;   FINE NEEDLE ASPIRATION  09/11/2021   Procedure: FINE NEEDLE ASPIRATION (FNA) LINEAR;  Surgeon: Lemar Lofty., MD;  Location: [facility] ENDOSCOPY;  Service: Gastroenterology;;  [name]. not in system to charge   TONSILLECTOMY  04/08/57   some date around then   TUBAL LIGATION  10/21/1984   UPPER ESOPHAGEAL ENDOSCOPIC ULTRASOUND (EUS) N/A 08/11/2021   Procedure: UPPER ESOPHAGEAL ENDOSCOPIC ULTRASOUND (EUS);  Surgeon: Jeani Hawking, MD;  Location: [facility] ENDOSCOPY;  Service: Endoscopy;  Laterality: N/A;    Family Psychiatric History: See below  Family History:  Family History  Problem Relation Age of Onset   Bipolar disorder Sister    Anxiety disorder Sister    Dementia Sister 45   Paranoid behavior Sister    Bipolar disorder Father    Schizophrenia Father    Alcohol abuse Maternal Uncle    Alcohol abuse Paternal Uncle    Anxiety disorder Mother    ADD / ADHD Other    ADD / ADHD Other    Healthy Other    OCD Neg Hx    Seizures Neg Hx    Sexual abuse Neg Hx    Physical abuse Neg Hx     Social History:  Social History   Socioeconomic History   Marital status: Married    Spouse name: Not on file   Number of children: Not on file   Years of education: Not on file   Highest education level: Not on file  Occupational History   Not on file  Tobacco Use   Smoking  status: Some Days    Current packs/day: 0.50    Average packs/day: 0.5 packs/day for 30.0 years (15.0 ttl pk-yrs)    Types: Cigarettes   Smokeless tobacco: Never   Tobacco comments:    11-15 cigs a day as of 05/01/2013  Vaping Use   Vaping status: Never Used  Substance and Sexual Activity   Alcohol use: Yes    Comment: pts son reports several drinks throughout the week   Drug use: No   Sexual activity: Yes    Partners: Male    Birth control/protection: Post-menopausal  Other Topics Concern   Not on file  Social History Narrative   Left handed    Lives alone    Social Determinants of Health   Financial Resource Strain: Not on file  Food Insecurity: No Food Insecurity (01/01/2023)   Hunger Vital Sign    Worried About Running Out of Food in the Last Year: Never true    Ran Out of Food in the  Last Year: Never true  Transportation Needs: No Transportation Needs (01/01/2023)   PRAPARE - Administrator, Civil Service (Medical): No    Lack of Transportation (Non-Medical): No  Physical Activity: Not on file  Stress: Not on file  Social Connections: Not on file    Allergies:  Allergies  Allergen Reactions   Amoxicillin-Pot Clavulanate Anaphylaxis and Swelling   Cephalosporins Anaphylaxis    Other reaction(s): Unknown   Codeine Itching   Cymbalta [Duloxetine Hcl] Other (See Comments)    GERD   Duloxetine     Other reaction(s): Other (See Comments), Unknown GERD    Oxycodone Itching and Other (See Comments)    "OUT THERE", climbing the walls   Penicillins Anaphylaxis and Swelling    Throat swelling Reaction: 8-10 years ago   Baclofen Other (See Comments)    Anxiety got much worse and possibly ppted manic episode Other reaction(s): Unknown   Povidone Iodine Itching and Other (See Comments)    Particularly if in a douche  Other reaction(s): Unknown   Flonase [Fluticasone]     Makes eye pressure to increase   Propoxyphene Itching    Anxiety/jittery    Sulfa  Antibiotics Swelling   Sulfasalazine Swelling    Other reaction(s): Unknown   Tramadol     Other reaction(s): Other (See Comments), Unknown Patient states that this makes her nervous    Amitriptyline Other (See Comments)    Caused bowels to empty quickly and couldn't sleep on it. Other reaction(s): Other (See Comments), Unknown Caused bowels to empty quickly and couldn't sleep on it. Caused bowels to empty quickly and couldn't sleep on it. Caused bowels to empty quickly and couldn't sleep on it.    Aspirin Hives and Rash   Latex Swelling and Other (See Comments)    If in the mouth, it swells   Nsaids Nausea Only and Other (See Comments)    Almost passing out, bad acid reflux Other reaction(s): Other (See Comments) other   Tolmetin Nausea Only    Other reaction(s): Other (See Comments), Unknown Almost passing out, bad acid reflux Almost passing out, bad acid reflux     Metabolic Disorder Labs: No results found for: "HGBA1C", "MPG" No results found for: "PROLACTIN" No results found for: "CHOL", "TRIG", "HDL", "CHOLHDL", "VLDL", "LDLCALC" Lab Results  Component Value Date   TSH 2.099 01/01/2023    Therapeutic Level Labs: No results found for: "LITHIUM" No results found for: "VALPROATE" No results found for: "CBMZ"  Current Medications: Current Outpatient Medications  Medication Sig Dispense Refill   acetaminophen (TYLENOL) 325 MG tablet Take 2 tablets (650 mg total) by mouth 4 (four) times daily.     ALPRAZolam (XANAX) 0.25 MG tablet Take 1 tablet (0.25 mg total) by mouth 2 (two) times daily as needed for anxiety. 60 tablet 2   amLODipine (NORVASC) 5 MG tablet Take 1 tablet (5 mg total) by mouth daily.     cetirizine (ZYRTEC) 10 MG tablet Take 10 mg by mouth at bedtime.     Cholecalciferol (VITAMIN D3) 125 MCG (5000 UT) TABS Take 5,000 Units by mouth daily.     feeding supplement (ENSURE ENLIVE / ENSURE PLUS) LIQD Take 237 mLs by mouth 2 (two) times daily between meals.  237 mL 12   gabapentin (NEURONTIN) 300 MG capsule Take 1 capsule (300 mg total) by mouth 2 (two) times daily. 180 capsule 3   ipratropium-albuterol (DUONEB) 0.5-2.5 (3) MG/3ML SOLN Take 3 mLs by nebulization every 4 (four)  hours as needed. 360 mL    Multiple Vitamin (MULTIVITAMIN WITH MINERALS) TABS tablet Take 1 tablet by mouth daily.     omeprazole (PRILOSEC) 20 MG capsule Take 20 mg by mouth daily.      Pancrelipase, Lip-Prot-Amyl, (CREON) 24000-76000 units CPEP Take 2 capsules with each meal, 1 after starting/1 when finished eating, and 1 capsule with snack after starting to eat.     PARoxetine (PAXIL) 40 MG tablet Take 1 tablet (40 mg total) by mouth every morning. 90 tablet 3   Polyethyl Glyc-Propyl Glyc PF (SYSTANE ULTRA PF) 0.4-0.3 % SOLN Place 1 drop into both eyes daily as needed (Dry eye).     sodium chloride (OCEAN) 0.65 % SOLN nasal spray Place 1 spray into both nostrils as needed for congestion.     tiZANidine (ZANAFLEX) 4 MG tablet Take 4 mg by mouth every 8 (eight) hours as needed (muscle pain).     traZODone (DESYREL) 150 MG tablet Take 1 tablet (150 mg total) by mouth at bedtime. 90 tablet 2   vitamin B-12 (CYANOCOBALAMIN) 1000 MCG tablet Take 1,000 mcg by mouth daily.     No current facility-administered medications for this visit.     Musculoskeletal: Strength & Muscle Tone: na Gait & Station: na Patient leans: N/A  Psychiatric Specialty Exam: Review of Systems  Neurological:  Positive for weakness.  All other systems reviewed and are negative.   There were no vitals taken for this visit.There is no height or weight on file to calculate BMI.  General Appearance: NA  Eye Contact:  NA  Speech:  Clear and Coherent  Volume:  Normal  Mood:  Euthymic  Affect:  NA  Thought Process:  Goal Directed  Orientation:  full  Thought Content: WDL   Suicidal Thoughts:  No  Homicidal Thoughts:  No  Memory:  Immediate;   Good Recent;   Good Remote;   NA  Judgement:  Good   Insight:  Fair  Psychomotor Activity:  Decreased  Concentration:  Concentration: Good and Attention Span: Good  Recall:  Good  Fund of Knowledge: Good  Language: Good  Akathisia:  No  Handed:  Right  AIMS (if indicated): not done  Assets:  Communication Skills Desire for Improvement Resilience Social Support  ADL's:  Intact  Cognition: WNL  Sleep:  Good   Screenings: PHQ2-9    Flowsheet Row Video Visit from 04/19/2022 in [facility] at [place] Video Visit from 01/24/2022 in [facility] at [place] Video Visit from 10/25/2021 in [facility] at [place] Video Visit from 07/25/2021 in [facility] at [place] Video Visit from 05/02/2021 in [facility] at [facility] Total Score 0 0 0 0 0      Flowsheet Row ED to Hosp-Admission (Discharged) from 12/31/2022 in [place] [facility] ED from 12/25/2022 in [facility] Video Visit from 04/19/2022 in [facility] at [place]  C-SSRS RISK CATEGORY No Risk No Risk No Risk        Assessment and Plan: This patient is a 72 year old female with a history of bipolar disorder and anxiety.  She is doing well on her current regimen.  She will continue Paxil 40 mg daily for depression, gabapentin 300 mg twice daily for anxiety and fibromyalgia, trazodone 100 mg at bedtime and Xanax 0.25 mg 3 times daily for anxiety.  She will  return to see me in 3 months  Collaboration of Care: Collaboration of Care: Primary Care Provider AEB notes are shared with PCP through the epic system  Patient/Guardian was advised Release of Information must be obtained prior to any record release in order to collaborate their care with an outside provider. Patient/Guardian was advised if they have not already done so to contact the  registration department to sign all necessary forms in order for [place] to release information regarding their care.   Consent: Patient/Guardian gives verbal consent for treatment and assignment of benefits for services provided during this visit. Patient/Guardian expressed understanding and agreed to proceed.    Diannia Ruder, MD 10/07/2023, 1:35 PM

## 2023-10-18 ENCOUNTER — Ambulatory Visit: Payer: Medicare HMO | Admitting: Internal Medicine

## 2023-10-18 NOTE — Progress Notes (Signed)
Erroneous encounter - please disregard.

## 2023-10-21 ENCOUNTER — Other Ambulatory Visit (HOSPITAL_COMMUNITY): Payer: Self-pay | Admitting: Psychiatry

## 2023-10-21 DIAGNOSIS — F319 Bipolar disorder, unspecified: Secondary | ICD-10-CM

## 2023-10-24 ENCOUNTER — Other Ambulatory Visit: Payer: Self-pay | Admitting: Internal Medicine

## 2023-10-24 ENCOUNTER — Ambulatory Visit: Payer: Medicare HMO | Attending: Internal Medicine | Admitting: Internal Medicine

## 2023-10-24 ENCOUNTER — Encounter: Payer: Self-pay | Admitting: Internal Medicine

## 2023-10-24 ENCOUNTER — Telehealth: Payer: Self-pay | Admitting: Internal Medicine

## 2023-10-24 ENCOUNTER — Other Ambulatory Visit: Payer: Medicare HMO

## 2023-10-24 VITALS — BP 118/70 | HR 44 | Ht 64.0 in | Wt 112.2 lb

## 2023-10-24 DIAGNOSIS — R001 Bradycardia, unspecified: Secondary | ICD-10-CM | POA: Diagnosis not present

## 2023-10-24 DIAGNOSIS — I10 Essential (primary) hypertension: Secondary | ICD-10-CM | POA: Diagnosis not present

## 2023-10-24 NOTE — Progress Notes (Signed)
Cardiology Office Note  Date: 10/24/2023   ID: Katherine Middleton, Katherine Middleton 04/25/1951, MRN [id number]  PCP:  Richmond Campbell., PA-C  Cardiologist:  Marjo Bicker, MD Electrophysiologist:  None   History of Present Illness: Katherine Middleton is a 72 y.o. female known to have HTN was referred to cardiology clinic for evaluation of sinus bradycardia.  EKG today showed CV sinus bradycardia, HR 41 bpm, asymptomatic.  Has positional dizziness, when she bends over and gets up from sitting to standing position.  Otherwise denies any exertional dizziness, syncope, presyncope, severe fatigue, SOB.  No angina.  She does have SOB sometimes but no DOE, orthopnea, PND.  No leg swelling.  I reviewed the EKGs on the chart, from 2022 that showed HR 69 bpm, NSR.  Past Medical History:  Diagnosis Date   Anxiety    Arthritis    Bipolar disorder (HCC)    Depression    Fibromyalgia    GERD (gastroesophageal reflux disease) 12/31/2002   HLD (hyperlipidemia)    diet controlled - no meds   HSV infection    Liver mass 07/2021   Osteoarthritis    Prolonged Q-T interval on ECG 02/2021   Sinusitis 12/31/2012   Wears glasses     Past Surgical History:  Procedure Laterality Date   BIOPSY  08/24/2021   Procedure: BIOPSY;  Surgeon: Lemar Lofty., MD;  Location: [facility] ENDOSCOPY;  Service: Gastroenterology;;   BIOPSY  09/11/2021   Procedure: BIOPSY;  Surgeon: Lemar Lofty., MD;  Location: [facility] ENDOSCOPY;  Service: Gastroenterology;;   ESOPHAGOGASTRODUODENOSCOPY (EGD) WITH PROPOFOL N/A 08/11/2021   Procedure: ESOPHAGOGASTRODUODENOSCOPY (EGD) WITH PROPOFOL;  Surgeon: Jeani Hawking, MD;  Location: [facility] ENDOSCOPY;  Service: Endoscopy;  Laterality: N/A;   ESOPHAGOGASTRODUODENOSCOPY (EGD) WITH PROPOFOL N/A 08/24/2021   Procedure: ESOPHAGOGASTRODUODENOSCOPY (EGD) WITH PROPOFOL;  Surgeon: Meridee Score Netty Starring., MD;  Location: [facility] ENDOSCOPY;  Service: Gastroenterology;  Laterality: N/A;    ESOPHAGOGASTRODUODENOSCOPY (EGD) WITH PROPOFOL N/A 09/11/2021   Procedure: ESOPHAGOGASTRODUODENOSCOPY (EGD) WITH PROPOFOL;  Surgeon: Meridee Score Netty Starring., MD;  Location: [facility] ENDOSCOPY;  Service: Gastroenterology;  Laterality: N/A;   EUS N/A 08/24/2021   Procedure: UPPER ENDOSCOPIC ULTRASOUND (EUS) LINEAR;  Surgeon: Lemar Lofty., MD;  Location[facility] ENDOSCOPY;  Service: Gastroenterology;  Laterality: N/A;   EUS N/A 09/11/2021   Procedure: UPPER ENDOSCOPIC ULTRASOUND (EUS) LINEAR;  Surgeon: Lemar Lofty., MD;  Location: [facility] ENDOSCOPY;  Service: Gastroenterology;  Laterality: N/A;   FINE NEEDLE ASPIRATION  08/24/2021   Procedure: FINE NEEDLE ASPIRATION;  Surgeon: Meridee Score Netty Starring., MD;  Location: [facility] ENDOSCOPY;  Service: Gastroenterology;;   FINE NEEDLE ASPIRATION  09/11/2021   Procedure: FINE NEEDLE ASPIRATION (FNA) LINEAR;  Surgeon: Lemar Lofty., MD;  Location: [facility] ENDOSCOPY;  Service: Gastroenterology;;  [name]. not in system to charge   TONSILLECTOMY  04/08/57   some date around then   TUBAL LIGATION  10/21/1984   UPPER ESOPHAGEAL ENDOSCOPIC ULTRASOUND (EUS) N/A 08/11/2021   Procedure: UPPER ESOPHAGEAL ENDOSCOPIC ULTRASOUND (EUS);  Surgeon: Jeani Hawking, MD;  Location: [facility] ENDOSCOPY;  Service: Endoscopy;  Laterality: N/A;    Current Outpatient Medications  Medication Sig Dispense Refill   acetaminophen (TYLENOL) 650 MG CR tablet Take 1,300 mg by mouth 2 (two) times daily as needed for pain.     ALPRAZolam (XANAX) 0.25 MG tablet Take 1 tablet (0.25 mg total) by mouth 2 (two) times daily as needed for anxiety. 60 tablet 2   amLODipine (NORVASC) 5 MG tablet Take 1 tablet (  5 mg total) by mouth daily.     cetirizine (ZYRTEC) 10 MG tablet Take 10 mg by mouth at bedtime.     Cholecalciferol (VITAMIN D3) 125 MCG (5000 UT) TABS Take 5,000 Units by mouth daily.     feeding supplement (ENSURE ENLIVE / ENSURE PLUS) LIQD Take 237 mLs by mouth 2 (two) times daily between  meals. 237 mL 12   gabapentin (NEURONTIN) 300 MG capsule Take 1 capsule (300 mg total) by mouth 2 (two) times daily. 180 capsule 3   Multiple Vitamin (MULTIVITAMIN WITH MINERALS) TABS tablet Take 1 tablet by mouth daily.     Pancrelipase, Lip-Prot-Amyl, (CREON) 24000-76000 units CPEP Take 2 capsules with each meal, 1 after starting/1 when finished eating, and 1 capsule with snack after starting to eat.     PARoxetine (PAXIL) 40 MG tablet Take 1 tablet (40 mg total) by mouth every morning. 90 tablet 3   Polyethyl Glyc-Propyl Glyc PF (SYSTANE ULTRA PF) 0.4-0.3 % SOLN Place 1 drop into both eyes daily as needed (Dry eye).     sodium chloride (OCEAN) 0.65 % SOLN nasal spray Place 1 spray into both nostrils as needed for congestion.     tiZANidine (ZANAFLEX) 4 MG tablet Take 4 mg by mouth daily as needed (muscle pain).     traZODone (DESYREL) 150 MG tablet Take 1 tablet (150 mg total) by mouth at bedtime. 90 tablet 2   vitamin B-12 (CYANOCOBALAMIN) 1000 MCG tablet Take 1,000 mcg by mouth daily.     ipratropium-albuterol (DUONEB) 0.5-2.5 (3) MG/3ML SOLN Take 3 mLs by nebulization every 4 (four) hours as needed. (Patient not taking: Reported on 10/24/2023) 360 mL    omeprazole (PRILOSEC) 20 MG capsule Take 20 mg by mouth daily.  (Patient not taking: Reported on 10/24/2023)     No current facility-administered medications for this visit.   Allergies:  Amoxicillin-pot clavulanate, Cephalosporins, Codeine, Cymbalta [duloxetine hcl], Duloxetine, Oxycodone, Penicillins, Baclofen, Povidone iodine, Flonase [fluticasone], Propoxyphene, Sulfa antibiotics, Sulfasalazine, Tramadol, Amitriptyline, Aspirin, Latex, Nsaids, and Tolmetin   Social History: The patient  reports that she has been smoking cigarettes. She has a 15 pack-year smoking history. She has never used smokeless tobacco. She reports current alcohol use. She reports that she does not use drugs.   Family History: The patient's family history includes  ADD / ADHD in some other family members; Alcohol abuse in her maternal uncle and paternal uncle; Anxiety disorder in her mother and sister; Bipolar disorder in her father and sister; Dementia (age of onset: 33) in her sister; Healthy in an other family member; Paranoid behavior in her sister; Schizophrenia in her father.   ROS:  Please see the history of present illness. Otherwise, complete review of systems is positive for none  All other systems are reviewed and negative.   Physical Exam: VS:  BP 118/70   Pulse (!) 44   Ht 5\' 4"  (1.626 m)   Wt 112 lb 3.2 oz (50.9 kg)   SpO2 92%   BMI 19.26 kg/m , BMI Body mass index is 19.26 kg/m.  Wt Readings from Last 3 Encounters:  10/24/23 112 lb 3.2 oz (50.9 kg)  01/15/23 102 lb (46.3 kg)  01/01/23 102 lb 11.8 oz (46.6 kg)    General: Patient appears comfortable at rest. HEENT: Conjunctiva and lids normal, oropharynx clear with moist mucosa. Neck: Supple, no elevated JVP or carotid bruits, no thyromegaly. Lungs: Clear to auscultation, nonlabored breathing at rest. Cardiac: Regular rate and rhythm, no S3 or  significant systolic murmur, no pericardial rub. Abdomen: Soft, nontender, no hepatomegaly, bowel sounds present, no guarding or rebound. Extremities: No pitting edema, distal pulses 2+. Skin: Warm and dry. Musculoskeletal: No kyphosis. Neuropsychiatric: Alert and oriented x3, affect grossly appropriate.  Recent Labwork: 01/01/2023: ALT 25; AST 79; Magnesium 2.0; TSH 2.099 01/02/2023: Hemoglobin 10.0; Platelets 344 01/03/2023: BUN 7; Creatinine, Ser 0.56; Potassium 3.9; Sodium 141  No results found for: "CHOL", "TRIG", "HDL", "CHOLHDL", "VLDL", "LDLCALC", "LDLDIRECT"    Assessment and Plan:   Severe sinus bradycardia, asymptomatic HTN, controlled   -EKG showed severe sinus bradycardia with HR 41 bpm, asymptomatic.  Has positional dizziness (bending over, from sitting to standing positions) but denies having any exertional dizziness,  denies syncope, severe fatigue or DOE.  Sometimes has mild SOB.  Will obtain 2-week event monitor, live and also obtain 2D echocardiogram.  I reviewed the EKGs from 2022 that showed HR 69 bpm.  Will obtain TSH.  Avoid AV nodal agents. -Continue amlodipine 5 mg once daily.   I have spent a total duration of 45 minutes reviewing the notes, labs, EKG, face-to-face discussion/counseling of her medical condition, pathophysiology, evaluation, management, ordering tests and documenting the findings in the note.    Medication Adjustments/Labs and Tests Ordered: Current medicines are reviewed at length with the patient today.  Concerns regarding medicines are outlined above.    Disposition:  Follow up  1 year  Signed Akeisha Lagerquist Verne Spurr, MD, 10/24/2023 5:09 PM    [facility] [address] [place], [place], [place] [address]

## 2023-10-24 NOTE — Patient Instructions (Addendum)
Medication Instructions:  Your physician recommends that you continue on your current medications as directed. Please refer to the Current Medication list given to you today.   Labwork: None  Testing/Procedures: Your physician has requested that you have an echocardiogram. Echocardiography is a painless test that uses sound waves to create images of your heart. It provides your doctor with information about the size and shape of your heart and how well your heart's chambers and valves are working. This procedure takes approximately one hour. There are no restrictions for this procedure. Please do NOT wear cologne, perfume, aftershave, or lotions (deodorant is allowed). Please arrive 15 minutes prior to your appointment time.  Your physician has recommended that you wear a Zio monitor.   This monitor is a medical device that records the heart's electrical activity. Doctors most often use these monitors to diagnose arrhythmias. Arrhythmias are problems with the speed or rhythm of the heartbeat. The monitor is a small device applied to your chest. You can wear one while you do your normal daily activities. While wearing this monitor if you have any symptoms to push the button and record what you felt. Once you have worn this monitor for the period of time provider prescribed (for 14 days), you will return the monitor device in the postage paid box. Once it is returned they will download the data collected and provide [place] with a report which the provider will then review and we will call you with those results. Important tips:  Avoid showering during the first 48 hours of wearing the monitor. Avoid excessive sweating to help maximize wear time. Do not submerge the device, no hot tubs, and no swimming pools. Keep any lotions or oils away from the patch. After 48 hours you may shower with the patch on. Take brief showers with your back facing the shower head.  Do not remove patch once it has been placed  because that will interrupt data and decrease adhesive wear time. Push the button when you have any symptoms and write down what you were feeling. Once you have completed wearing your monitor, remove and place into box which has postage paid and place in your outgoing mailbox.  If for some reason you have misplaced your box then call our office and we can provide another box and/or mail it off for you.   Follow-Up: Your physician recommends that you schedule a follow-up appointment in: 1 year. You will receive a reminder call in about 8 months reminding you to schedule your appointment. If you don't receive this call, please contact our office.   Any Other Special Instructions Will Be Listed Below (If Applicable). Thank you for choosing [facility]!      If you need a refill on your cardiac medications before your next appointment, please call your pharmacy.

## 2023-10-24 NOTE — Telephone Encounter (Signed)
Checking percert on the following patient   2D Echo Dx: Bradycardia 2 week Zio AT monitor Dx: Bradycardia

## 2023-10-28 ENCOUNTER — Telehealth: Payer: Self-pay

## 2023-10-28 NOTE — Telephone Encounter (Signed)
Called to see whether patient was still wearing monitor. Received call that [employer] that there was no contact with skin and they were unable to reach patient. Daughter Morrie Sheldon stated that she received the voicemail. She will try to reach her and let us know. I was able to reach patient as well.

## 2023-10-31 ENCOUNTER — Telehealth: Payer: Self-pay | Admitting: Internal Medicine

## 2023-10-31 NOTE — Telephone Encounter (Signed)
Received call from [place] with [employer] - courtesy call reporting 30's bpm - sinus rhythm.  Stated they did try to reach out to patient with no answer.  Per Zio report - patient on day 7 of 14 day monitor -   Wednesday, 10/30 - 38-48 bpm sinus Thursday, 10/24 - 36-52 bpm sinus

## 2023-10-31 NOTE — Telephone Encounter (Signed)
Katherine Middleton with [employer] calling to report abnormal zio results

## 2023-10-31 NOTE — Telephone Encounter (Signed)
Left message to return call 

## 2023-11-04 NOTE — Telephone Encounter (Signed)
Spoke with patient she stated on 10/24 the monitor come off in her sleep and she put it back on when she woke up she is to remove monitor later this week and send it back to be read.  Patient states she is fine and feels great

## 2023-11-12 ENCOUNTER — Ambulatory Visit: Payer: Medicare HMO | Attending: Internal Medicine

## 2023-11-12 DIAGNOSIS — R001 Bradycardia, unspecified: Secondary | ICD-10-CM | POA: Diagnosis not present

## 2023-11-12 LAB — ECHOCARDIOGRAM COMPLETE
AR max vel: 2.73 cm2
AV Area VTI: 2.4 cm2
AV Area mean vel: 2.57 cm2
AV Mean grad: 5 mm[Hg]
AV Peak grad: 9.1 mm[Hg]
Ao pk vel: 1.51 m/s
Area-P 1/2: 3.24 cm2
Calc EF: 66.5 %
MV VTI: 1.77 cm2
S' Lateral: 2.7 cm
Single Plane A2C EF: 65.9 %
Single Plane A4C EF: 64.4 %

## 2023-11-21 ENCOUNTER — Telehealth: Payer: Self-pay | Admitting: Internal Medicine

## 2023-11-21 NOTE — Telephone Encounter (Signed)
[employer] calling with abn results

## 2023-11-21 NOTE — Telephone Encounter (Signed)
Katherine Middleton from [place] called in to report an incident found on final monitor. Back on 10/25, 8:28 am EST, 16 seconds of sinus bradycardia, HR 38, and triggered by patient. Will forward to MD for his FYI when reviewing final monitor report.

## 2023-11-26 NOTE — Telephone Encounter (Signed)
Left message to call the office.

## 2023-11-26 NOTE — Telephone Encounter (Signed)
Left message to call the office.

## 2023-11-26 NOTE — Telephone Encounter (Signed)
Called patient with event monitor concerns and that she will be called to schedule an appointment. Patient verbalized understanding.

## 2023-12-09 ENCOUNTER — Telehealth: Payer: Self-pay | Admitting: Internal Medicine

## 2023-12-09 NOTE — Telephone Encounter (Signed)
Called patient's home # and LMOVM. Dr. Jenene Slicker has requested to see her this week in the [facility] office regarding heart monitor results. Called her son and no answer.

## 2023-12-13 NOTE — Telephone Encounter (Signed)
Spoke to Dr. Jenene Slicker regarding this pt and was advised to add her onto her schedule in [place] for the week of 12/16-12/20 if our office could get in touch with her. Overbook okay.

## 2023-12-17 ENCOUNTER — Ambulatory Visit: Payer: Medicare HMO | Attending: Internal Medicine | Admitting: Internal Medicine

## 2023-12-17 ENCOUNTER — Encounter: Payer: Self-pay | Admitting: Internal Medicine

## 2023-12-17 VITALS — BP 134/78 | HR 44 | Ht 64.0 in | Wt 112.2 lb

## 2023-12-17 DIAGNOSIS — R001 Bradycardia, unspecified: Secondary | ICD-10-CM

## 2023-12-17 DIAGNOSIS — I10 Essential (primary) hypertension: Secondary | ICD-10-CM | POA: Diagnosis not present

## 2023-12-17 NOTE — Patient Instructions (Addendum)
Medication Instructions:  Your physician recommends that you continue on your current medications as directed. Please refer to the Current Medication list given to you today.   Labwork: None  Testing/Procedures: None  Follow-Up: Your physician recommends that you schedule a follow-up appointment in: 10 months   Any Other Special Instructions Will Be Listed Below (If Applicable).  Thank you for choosing  [facility]!      If you need a refill on your cardiac medications before your next appointment, please call your pharmacy.

## 2023-12-17 NOTE — Progress Notes (Signed)
Cardiology Office Note  Date: 12/17/2023   ID: Katherine Middleton, Katherine Middleton Jun 23, 1951, MRN [id number]  PCP:  Richmond Campbell., PA-C  Cardiologist:  Marjo Bicker, MD Electrophysiologist:  None   History of Present Illness: Katherine Middleton is a 72 y.o. female known to have HTN is here for follow-up visit.  I reviewed the event monitor results with the patient.  She continues to have positional dizziness but no dizziness with exertion.  No lightheadedness, syncope, palpitations, severe fatigue.  No angina or DOE.  Echo in 2024 showed normal LVEF and no valvular heart disease.  TSH within normal limits in 2024.  I reviewed the EKGs on the chart, from 2022 that showed HR 69 bpm, NSR.  Past Medical History:  Diagnosis Date   Anxiety    Arthritis    Bipolar disorder (HCC)    Depression    Fibromyalgia    GERD (gastroesophageal reflux disease) 12/31/2002   HLD (hyperlipidemia)    diet controlled - no meds   HSV infection    Liver mass 07/2021   Osteoarthritis    Prolonged Q-T interval on ECG 02/2021   Sinusitis 12/31/2012   Wears glasses     Past Surgical History:  Procedure Laterality Date   BIOPSY  08/24/2021   Procedure: BIOPSY;  Surgeon: Lemar Lofty., MD;  Location: [facility] ENDOSCOPY;  Service: Gastroenterology;;   BIOPSY  09/11/2021   Procedure: BIOPSY;  Surgeon: Lemar Lofty., MD;  Location: [facility] ENDOSCOPY;  Service: Gastroenterology;;   ESOPHAGOGASTRODUODENOSCOPY (EGD) WITH PROPOFOL N/A 08/11/2021   Procedure: ESOPHAGOGASTRODUODENOSCOPY (EGD) WITH PROPOFOL;  Surgeon: Jeani Hawking, MD;  Location: [facility] ENDOSCOPY;  Service: Endoscopy;  Laterality: N/A;   ESOPHAGOGASTRODUODENOSCOPY (EGD) WITH PROPOFOL N/A 08/24/2021   Procedure: ESOPHAGOGASTRODUODENOSCOPY (EGD) WITH PROPOFOL;  Surgeon: Meridee Score Netty Starring., MD;  Location: [facility] ENDOSCOPY;  Service: Gastroenterology;  Laterality: N/A;   ESOPHAGOGASTRODUODENOSCOPY (EGD) WITH PROPOFOL N/A 09/11/2021    Procedure: ESOPHAGOGASTRODUODENOSCOPY (EGD) WITH PROPOFOL;  Surgeon: Meridee Score Netty Starring., MD;  Location: [facility] ENDOSCOPY;  Service: Gastroenterology;  Laterality: N/A;   EUS N/A 08/24/2021   Procedure: UPPER ENDOSCOPIC ULTRASOUND (EUS) LINEAR;  Surgeon: Lemar Lofty., MD;  Location: [facility] ENDOSCOPY;  Service: Gastroenterology;  Laterality: N/A;   EUS N/A 09/11/2021   Procedure: UPPER ENDOSCOPIC ULTRASOUND (EUS) LINEAR;  Surgeon: Lemar Lofty., MD;  Location: [facility] ENDOSCOPY;  Service: Gastroenterology;  Laterality: N/A;   FINE NEEDLE ASPIRATION  08/24/2021   Procedure: FINE NEEDLE ASPIRATION;  Surgeon: Meridee Score Netty Starring., MD;  Location: [facility] ENDOSCOPY;  Service: Gastroenterology;;   FINE NEEDLE ASPIRATION  09/11/2021   Procedure: FINE NEEDLE ASPIRATION (FNA) LINEAR;  Surgeon: Lemar Lofty., MD;  Location: [facility] ENDOSCOPY;  Service: Gastroenterology;;  [name]. not in system to charge   TONSILLECTOMY  04/08/57   some date around then   TUBAL LIGATION  10/21/1984   UPPER ESOPHAGEAL ENDOSCOPIC ULTRASOUND (EUS) N/A 08/11/2021   Procedure: UPPER ESOPHAGEAL ENDOSCOPIC ULTRASOUND (EUS);  Surgeon: Jeani Hawking, MD;  Location: [facility] ENDOSCOPY;  Service: Endoscopy;  Laterality: N/A;    Current Outpatient Medications  Medication Sig Dispense Refill   acetaminophen (TYLENOL) 650 MG CR tablet Take 1,300 mg by mouth 2 (two) times daily as needed for pain.     ALPRAZolam (XANAX) 0.25 MG tablet Take 1 tablet (0.25 mg total) by mouth 2 (two) times daily as needed for anxiety. 60 tablet 2   amLODipine (NORVASC) 5 MG tablet Take 1 tablet (5 mg total) by mouth daily.  cetirizine (ZYRTEC) 10 MG tablet Take 10 mg by mouth at bedtime.     Cholecalciferol (VITAMIN D3) 125 MCG (5000 UT) TABS Take 5,000 Units by mouth daily.     feeding supplement (ENSURE ENLIVE / ENSURE PLUS) LIQD Take 237 mLs by mouth 2 (two) times daily between meals. 237 mL 12   gabapentin (NEURONTIN) 300 MG capsule Take 1  capsule (300 mg total) by mouth 2 (two) times daily. 180 capsule 3   Multiple Vitamin (MULTIVITAMIN WITH MINERALS) TABS tablet Take 1 tablet by mouth daily.     omeprazole (PRILOSEC) 20 MG capsule Take 20 mg by mouth as needed.     Pancrelipase, Lip-Prot-Amyl, (CREON) 24000-76000 units CPEP Take 2 capsules with each meal, 1 after starting/1 when finished eating, and 1 capsule with snack after starting to eat.     PARoxetine (PAXIL) 40 MG tablet Take 1 tablet (40 mg total) by mouth every morning. 90 tablet 3   Polyethyl Glyc-Propyl Glyc PF (SYSTANE ULTRA PF) 0.4-0.3 % SOLN Place 1 drop into both eyes daily as needed (Dry eye).     sodium chloride (OCEAN) 0.65 % SOLN nasal spray Place 1 spray into both nostrils as needed for congestion.     tiZANidine (ZANAFLEX) 4 MG tablet Take 4 mg by mouth daily as needed (muscle pain).     traZODone (DESYREL) 150 MG tablet Take 1 tablet (150 mg total) by mouth at bedtime. 90 tablet 2   vitamin B-12 (CYANOCOBALAMIN) 1000 MCG tablet Take 1,000 mcg by mouth daily.     No current facility-administered medications for this visit.   Allergies:  Amoxicillin-pot clavulanate, Cephalosporins, Codeine, Cymbalta [duloxetine hcl], Duloxetine, Oxycodone, Penicillins, Baclofen, Povidone iodine, Flonase [fluticasone], Propoxyphene, Sulfa antibiotics, Sulfasalazine, Tramadol, Amitriptyline, Aspirin, Latex, Nsaids, and Tolmetin   Social History: The patient  reports that she has been smoking cigarettes. She has a 15 pack-year smoking history. She has never used smokeless tobacco. She reports current alcohol use. She reports that she does not use drugs.   Family History: The patient's family history includes ADD / ADHD in some other family members; Alcohol abuse in her maternal uncle and paternal uncle; Anxiety disorder in her mother and sister; Bipolar disorder in her father and sister; Dementia (age of onset: 51) in her sister; Healthy in an other family member; Paranoid behavior  in her sister; Schizophrenia in her father.   ROS:  Please see the history of present illness. Otherwise, complete review of systems is positive for none  All other systems are reviewed and negative.   Physical Exam: VS:  BP 134/78   Pulse (!) 44   Ht 5\' 4"  (1.626 m)   Wt 112 lb 3.2 oz (50.9 kg)   SpO2 96%   BMI 19.26 kg/m , BMI Body mass index is 19.26 kg/m.  Wt Readings from Last 3 Encounters:  12/17/23 112 lb 3.2 oz (50.9 kg)  10/24/23 112 lb 3.2 oz (50.9 kg)  01/15/23 102 lb (46.3 kg)    General: Patient appears comfortable at rest. HEENT: Conjunctiva and lids normal, oropharynx clear with moist mucosa. Neck: Supple, no elevated JVP or carotid bruits, no thyromegaly. Lungs: Clear to auscultation, nonlabored breathing at rest. Cardiac: Regular rate and rhythm, no S3 or significant systolic murmur, no pericardial rub. Abdomen: Soft, nontender, no hepatomegaly, bowel sounds present, no guarding or rebound. Extremities: No pitting edema, distal pulses 2+. Skin: Warm and dry. Musculoskeletal: No kyphosis. Neuropsychiatric: Alert and oriented x3, affect grossly appropriate.  Recent Labwork: 01/01/2023:  ALT 25; AST 79; Magnesium 2.0; TSH 2.099 01/02/2023: Hemoglobin 10.0; Platelets 344 01/03/2023: BUN 7; Creatinine, Ser 0.56; Potassium 3.9; Sodium 141  No results found for: "CHOL", "TRIG", "HDL", "CHOLHDL", "VLDL", "LDLCALC", "LDLDIRECT"    Assessment and Plan:   Severe sinus bradycardia, asymptomatic HTN, controlled   -Event monitor from 11/2023 showed minimum HR 28 bpm (during sleep) and maximum HR 92 bpm with an average HR 41 bpm.  PAC burden was 3.7% and PVC burden was less than 1%.  Symptomatic with NSR, 48 bpm and PAC.  She continues to deny any symptoms of dizziness, lightheadedness, syncope or severe fatigue.  She walked in the clinic and she was noted to have chronotropic competence.  Echocardiogram showed normal LVEF and no valvular heart disease.  TSH within normal  limits from 12/2022.  Avoid AV nodal agents.  Will closely monitor.  No indication of PPM at this time. -Continue amlodipine 5 mg once daily.    Medication Adjustments/Labs and Tests Ordered: Current medicines are reviewed at length with the patient today.  Concerns regarding medicines are outlined above.    Disposition:  Follow up  10 months  Signed Willia Lampert Verne Spurr, MD, 12/17/2023 10:25 AM    [facility] at [facility] [address] [place], [place], [place] [address]

## 2024-01-08 ENCOUNTER — Ambulatory Visit: Payer: Medicare HMO | Admitting: Nurse Practitioner

## 2024-01-22 ENCOUNTER — Encounter (HOSPITAL_COMMUNITY): Payer: Self-pay | Admitting: Psychiatry

## 2024-01-22 ENCOUNTER — Telehealth (HOSPITAL_COMMUNITY): Payer: Medicare (Managed Care) | Admitting: Psychiatry

## 2024-01-22 DIAGNOSIS — F319 Bipolar disorder, unspecified: Secondary | ICD-10-CM | POA: Diagnosis not present

## 2024-01-22 MED ORDER — GABAPENTIN 300 MG PO CAPS: 300.0000 mg | ORAL_CAPSULE | Freq: Two times a day (BID) | ORAL | 3 refills | Status: DC

## 2024-01-22 MED ORDER — PAROXETINE HCL 40 MG PO TABS: 40.0000 mg | ORAL_TABLET | Freq: Every morning | ORAL | 3 refills | Status: DC

## 2024-01-22 MED ORDER — ALPRAZOLAM 0.25 MG PO TABS: 0.2500 mg | ORAL_TABLET | Freq: Two times a day (BID) | ORAL | 2 refills | Status: DC | PRN

## 2024-01-22 MED ORDER — TRAZODONE HCL 150 MG PO TABS: 150.0000 mg | ORAL_TABLET | Freq: Every day | ORAL | 2 refills | Status: DC

## 2024-01-22 NOTE — Progress Notes (Signed)
Virtual Visit via Telephone Note  I connected with Katherine Middleton on 01/22/24 at  1:00 PM EST by telephone and verified that I am speaking with the correct person using two identifiers.  Location: Patient: home Provider: office   I discussed the limitations, risks, security and privacy concerns of performing an evaluation and management service by telephone and the availability of in person appointments. I also discussed with the patient that there may be a patient responsible charge related to this service. The patient expressed understanding and agreed to proceed.      I discussed the assessment and treatment plan with the patient. The patient was provided an opportunity to ask questions and all were answered. The patient agreed with the plan and demonstrated an understanding of the instructions.   The patient was advised to call back or seek an in-person evaluation if the symptoms worsen or if the condition fails to improve as anticipated.  I provided 20 minutes of non-face-to-face time during this encounter.   Diannia Ruder, MD  [facility] MD/PA/NP OP Progress Note  01/22/2024 1:17 PM Katherine Middleton  MRN:  [id number]  Chief Complaint:  Chief Complaint  Patient presents with   Depression   Anxiety   Follow-up   HPI: Patient is 73 year-old widowed Caucasian female who lives alone in [place]. . She is on disability for fibromyalgia arthritis and bipolar disorder.   The patient returns for follow-up after 4 months regarding her bipolar disorder.  She states that she is generally doing well.  She has been followed by cardiology recently because of bradycardia but she had a normal EKG and normal echo and they have not recommended any medication changes.  She claims that she is eating better but her weight remains at 112 pounds.  In terms of mood she states she is doing well.  She denies significant depression or anxiety.  She is sleeping well.  She denies any thoughts of self-harm  or suicide Visit Diagnosis:    ICD-10-CM   1. Bipolar 1 disorder (HCC)  F31.9 traZODone (DESYREL) 150 MG tablet    PARoxetine (PAXIL) 40 MG tablet      Past Psychiatric History: Long-term outpatient treatment  Past Medical History:  Past Medical History:  Diagnosis Date   Anxiety    Arthritis    Bipolar disorder (HCC)    Depression    Fibromyalgia    GERD (gastroesophageal reflux disease) 12/31/2002   HLD (hyperlipidemia)    diet controlled - no meds   HSV infection    Liver mass 07/2021   Osteoarthritis    Prolonged Q-T interval on ECG 02/2021   Sinusitis 12/31/2012   Wears glasses     Past Surgical History:  Procedure Laterality Date   BIOPSY  08/24/2021   Procedure: BIOPSY;  Surgeon: Lemar Lofty., MD;  Location: [facility] ENDOSCOPY;  Service: Gastroenterology;;   BIOPSY  09/11/2021   Procedure: BIOPSY;  Surgeon: Lemar Lofty., MD;  Location: [facility] ENDOSCOPY;  Service: Gastroenterology;;   ESOPHAGOGASTRODUODENOSCOPY (EGD) WITH PROPOFOL N/A 08/11/2021   Procedure: ESOPHAGOGASTRODUODENOSCOPY (EGD) WITH PROPOFOL;  Surgeon: Jeani Hawking, MD;  Location: [facility] ENDOSCOPY;  Service: Endoscopy;  Laterality: N/A;   ESOPHAGOGASTRODUODENOSCOPY (EGD) WITH PROPOFOL N/A 08/24/2021   Procedure: ESOPHAGOGASTRODUODENOSCOPY (EGD) WITH PROPOFOL;  Surgeon: Meridee Score Netty Starring., MD;  Location: [facility] ENDOSCOPY;  Service: Gastroenterology;  Laterality: N/A;   ESOPHAGOGASTRODUODENOSCOPY (EGD) WITH PROPOFOL N/A 09/11/2021   Procedure: ESOPHAGOGASTRODUODENOSCOPY (EGD) WITH PROPOFOL;  Surgeon: Meridee Score Netty Starring., MD;  Location: [facility] ENDOSCOPY;  Service: Gastroenterology;  Laterality: N/A;   EUS N/A 08/24/2021   Procedure: UPPER ENDOSCOPIC ULTRASOUND (EUS) LINEAR;  Surgeon: Lemar Lofty., MD;  Location: [facility] ENDOSCOPY;  Service: Gastroenterology;  Laterality: N/A;   EUS N/A 09/11/2021   Procedure: UPPER ENDOSCOPIC ULTRASOUND (EUS) LINEAR;  Surgeon: Lemar Lofty., MD;  Location:  [facility] ENDOSCOPY;  Service: Gastroenterology;  Laterality: N/A;   FINE NEEDLE ASPIRATION  08/24/2021   Procedure: FINE NEEDLE ASPIRATION;  Surgeon: Meridee Score Netty Starring., MD;  Location: [facility] ENDOSCOPY;  Service: Gastroenterology;;   FINE NEEDLE ASPIRATION  09/11/2021   Procedure: FINE NEEDLE ASPIRATION (FNA) LINEAR;  Surgeon: Lemar Lofty., MD;  Location: [facility] ENDOSCOPY;  Service: Gastroenterology;;  [name]. not in system to charge   TONSILLECTOMY  04/08/57   some date around then   TUBAL LIGATION  10/21/1984   UPPER ESOPHAGEAL ENDOSCOPIC ULTRASOUND (EUS) N/A 08/11/2021   Procedure: UPPER ESOPHAGEAL ENDOSCOPIC ULTRASOUND (EUS);  Surgeon: Jeani Hawking, MD;  Location: [facility] ENDOSCOPY;  Service: Endoscopy;  Laterality: N/A;    Family Psychiatric History: See below  Family History:  Family History  Problem Relation Age of Onset   Bipolar disorder Sister    Anxiety disorder Sister    Dementia Sister 41   Paranoid behavior Sister    Bipolar disorder Father    Schizophrenia Father    Alcohol abuse Maternal Uncle    Alcohol abuse Paternal Uncle    Anxiety disorder Mother    ADD / ADHD Other    ADD / ADHD Other    Healthy Other    OCD Neg Hx    Seizures Neg Hx    Sexual abuse Neg Hx    Physical abuse Neg Hx     Social History:  Social History   Socioeconomic History   Marital status: Married    Spouse name: Not on file   Number of children: Not on file   Years of education: Not on file   Highest education level: Not on file  Occupational History   Not on file  Tobacco Use   Smoking status: Some Days    Current packs/day: 0.50    Average packs/day: 0.5 packs/day for 30.0 years (15.0 ttl pk-yrs)    Types: Cigarettes   Smokeless tobacco: Never   Tobacco comments:    11-15 cigs a day as of 05/01/2013  Vaping Use   Vaping status: Never Used  Substance and Sexual Activity   Alcohol use: Yes    Comment: pts son reports several drinks throughout the week   Drug use: No    Sexual activity: Yes    Partners: Male    Birth control/protection: Post-menopausal  Other Topics Concern   Not on file  Social History Narrative   Left handed    Lives alone    Social Drivers of Health   Financial Resource Strain: Not on file  Food Insecurity: No Food Insecurity (01/01/2023)   Hunger Vital Sign    Worried About Running Out of Food in the Last Year: Never true    Ran Out of Food in the Last Year: Never true  Transportation Needs: No Transportation Needs (01/01/2023)   PRAPARE - Administrator, Civil Service (Medical): No    Lack of Transportation (Non-Medical): No  Physical Activity: Not on file  Stress: Not on file  Social Connections: Not on file    Allergies:  Allergies  Allergen Reactions   Amoxicillin-Pot Clavulanate Anaphylaxis and Swelling   Cephalosporins Anaphylaxis  Other reaction(s): Unknown   Codeine Itching   Cymbalta [Duloxetine Hcl] Other (See Comments)    GERD   Duloxetine     Other reaction(s): Other (See Comments), Unknown GERD    Oxycodone Itching and Other (See Comments)    "OUT THERE", climbing the walls   Penicillins Anaphylaxis and Swelling    Throat swelling Reaction: 8-10 years ago   Baclofen Other (See Comments)    Anxiety got much worse and possibly ppted manic episode Other reaction(s): Unknown   Povidone Iodine Itching and Other (See Comments)    Particularly if in a douche  Other reaction(s): Unknown   Flonase [Fluticasone]     Makes eye pressure to increase   Propoxyphene Itching    Anxiety/jittery    Sulfa Antibiotics Swelling   Sulfasalazine Swelling    Other reaction(s): Unknown   Tramadol     Other reaction(s): Other (See Comments), Unknown Patient states that this makes her nervous    Amitriptyline Other (See Comments)    Caused bowels to empty quickly and couldn't sleep on it. Other reaction(s): Other (See Comments), Unknown Caused bowels to empty quickly and couldn't sleep on it. Caused  bowels to empty quickly and couldn't sleep on it. Caused bowels to empty quickly and couldn't sleep on it.    Aspirin Hives and Rash   Latex Swelling and Other (See Comments)    If in the mouth, it swells   Nsaids Nausea Only and Other (See Comments)    Almost passing out, bad acid reflux Other reaction(s): Other (See Comments) other   Tolmetin Nausea Only    Other reaction(s): Other (See Comments), Unknown Almost passing out, bad acid reflux Almost passing out, bad acid reflux     Metabolic Disorder Labs: No results found for: "HGBA1C", "MPG" No results found for: "PROLACTIN" No results found for: "CHOL", "TRIG", "HDL", "CHOLHDL", "VLDL", "LDLCALC" Lab Results  Component Value Date   TSH 2.099 01/01/2023    Therapeutic Level Labs: No results found for: "LITHIUM" No results found for: "VALPROATE" No results found for: "CBMZ"  Current Medications: Current Outpatient Medications  Medication Sig Dispense Refill   acetaminophen (TYLENOL) 650 MG CR tablet Take 1,300 mg by mouth 2 (two) times daily as needed for pain.     ALPRAZolam (XANAX) 0.25 MG tablet Take 1 tablet (0.25 mg total) by mouth 2 (two) times daily as needed for anxiety. 60 tablet 2   amLODipine (NORVASC) 5 MG tablet Take 1 tablet (5 mg total) by mouth daily.     cetirizine (ZYRTEC) 10 MG tablet Take 10 mg by mouth at bedtime.     Cholecalciferol (VITAMIN D3) 125 MCG (5000 UT) TABS Take 5,000 Units by mouth daily.     feeding supplement (ENSURE ENLIVE / ENSURE PLUS) LIQD Take 237 mLs by mouth 2 (two) times daily between meals. 237 mL 12   gabapentin (NEURONTIN) 300 MG capsule Take 1 capsule (300 mg total) by mouth 2 (two) times daily. 180 capsule 3   Multiple Vitamin (MULTIVITAMIN WITH MINERALS) TABS tablet Take 1 tablet by mouth daily.     omeprazole (PRILOSEC) 20 MG capsule Take 20 mg by mouth as needed.     Pancrelipase, Lip-Prot-Amyl, (CREON) 24000-76000 units CPEP Take 2 capsules with each meal, 1 after  starting/1 when finished eating, and 1 capsule with snack after starting to eat.     PARoxetine (PAXIL) 40 MG tablet Take 1 tablet (40 mg total) by mouth every morning. 90 tablet 3  Polyethyl Glyc-Propyl Glyc PF (SYSTANE ULTRA PF) 0.4-0.3 % SOLN Place 1 drop into both eyes daily as needed (Dry eye).     sodium chloride (OCEAN) 0.65 % SOLN nasal spray Place 1 spray into both nostrils as needed for congestion.     tiZANidine (ZANAFLEX) 4 MG tablet Take 4 mg by mouth daily as needed (muscle pain).     traZODone (DESYREL) 150 MG tablet Take 1 tablet (150 mg total) by mouth at bedtime. 90 tablet 2   vitamin B-12 (CYANOCOBALAMIN) 1000 MCG tablet Take 1,000 mcg by mouth daily.     No current facility-administered medications for this visit.     Musculoskeletal: Strength & Muscle Tone: na Gait & Station: na Patient leans: N/A  Psychiatric Specialty Exam: Review of Systems  All other systems reviewed and are negative.   There were no vitals taken for this visit.There is no height or weight on file to calculate BMI.  General Appearance: NA  Eye Contact:  NA  Speech:  Clear and Coherent  Volume:  Normal  Mood:  Euthymic  Affect:  NA  Thought Process:  Goal Directed  Orientation:  Full (Time, Place, and Person)  Thought Content: WDL   Suicidal Thoughts:  No  Homicidal Thoughts:  No  Memory:  Immediate;   Good Recent;   Fair Remote;   NA  Judgement:  Good  Insight:  Fair  Psychomotor Activity:  Decreased  Concentration:  Concentration: Fair and Attention Span: Fair  Recall:  Fair  Fund of Knowledge: Good  Language: Good  Akathisia:  No  Handed:  Right  AIMS (if indicated): not done  Assets:  Communication Skills Desire for Improvement Resilience Social Support  ADL's:  Intact  Cognition: WNL  Sleep:  Good   Screenings: PHQ2-9    Flowsheet Row Video Visit from 04/19/2022 in [facility] at [place] Video Visit from 01/24/2022 in [facility] at [place] Video Visit from 10/25/2021 in [facility] Out[facility] at [place] Video Visit from 07/25/2021 in [facility] at [place] Video Visit from 05/02/2021 in [facility] at [facility] Total Score 0 0 0 0 0      Flowsheet Row ED to Hosp-Admission (Discharged) from 12/31/2022 in [place] [facility] ED from 12/25/2022 in [facility] Department at [facility] Video Visit from 04/19/2022 in [facility] Outpatient Behavioral [facility] at [place]  C-SSRS RISK CATEGORY No Risk No Risk No Risk        Assessment and Plan: This patient is a 73 year old female with a history of bipolar disorder and anxiety.  For the most part she is doing well on her regimen.  She did seem a little bit confused today and we will need to keep an eye on this.  For now she will continue Paxil 40 mg daily for depression, gabapentin 300 mg twice daily for anxiety and fibromyalgia, trazodone 100 mg at bedtime and Xanax 0.25 mg 3 times daily for anxiety.  She will return to see me in 4 months  Collaboration of Care: Collaboration of Care: Primary Care Provider AEB notes are shared with PCP through the epic system  Patient/Guardian was advised Release of Information must be obtained prior to any record release in order to collaborate their care with an outside provider. Patient/Guardian was advised if they have not already done so to contact the registration department to sign all necessary forms in  order for [place] to release information regarding their care.   Consent: Patient/Guardian gives verbal consent for treatment and assignment of benefits for services provided during this visit. Patient/Guardian expressed understanding and agreed to proceed.    Diannia Ruder, MD 01/22/2024, 1:17 PM

## 2024-02-04 ENCOUNTER — Other Ambulatory Visit (HOSPITAL_COMMUNITY): Payer: Self-pay | Admitting: Psychiatry

## 2024-02-04 MED ORDER — ALPRAZOLAM 0.25 MG PO TABS: 0.2500 mg | ORAL_TABLET | Freq: Two times a day (BID) | ORAL | 2 refills | Status: DC | PRN

## 2024-04-07 ENCOUNTER — Ambulatory Visit: Payer: Medicare (Managed Care) | Attending: Nurse Practitioner | Admitting: Nurse Practitioner

## 2024-04-07 ENCOUNTER — Encounter: Payer: Self-pay | Admitting: Nurse Practitioner

## 2024-04-07 VITALS — BP 114/62 | HR 42 | Ht 64.0 in | Wt 115.6 lb

## 2024-04-07 DIAGNOSIS — E785 Hyperlipidemia, unspecified: Secondary | ICD-10-CM

## 2024-04-07 DIAGNOSIS — R001 Bradycardia, unspecified: Secondary | ICD-10-CM | POA: Diagnosis not present

## 2024-04-07 DIAGNOSIS — R42 Dizziness and giddiness: Secondary | ICD-10-CM

## 2024-04-07 DIAGNOSIS — I10 Essential (primary) hypertension: Secondary | ICD-10-CM | POA: Diagnosis not present

## 2024-04-07 MED ORDER — SIMVASTATIN 20 MG PO TABS: 20.0000 mg | ORAL_TABLET | Freq: Every day | ORAL | 1 refills | Status: DC

## 2024-04-07 MED ORDER — AMLODIPINE BESYLATE 2.5 MG PO TABS: 2.5000 mg | ORAL_TABLET | Freq: Every day | ORAL | 1 refills | Status: AC

## 2024-04-07 NOTE — Progress Notes (Unsigned)
 Cardiology Office Note:  .   Date: 04/07/2024 ID:  Eustace Pen, DOB 07-28-1951, MRN [id number] PCP: Richmond Campbell., PA-C  [facility] Providers Cardiologist:  Marjo Bicker, MD    History of Present Illness: .   Katherine Middleton is a 73 y.o. female with a PMH of severe sinus bradycardia, positional dizziness, and HTN, who presents today for scheduled follow-up.   TTE from 2024 revealed normal LVEF, no significant valvular abnormalities.  Event monitor in November 2024 revealed average HR 41 bpm (HR ranging from 28 bpm (during sleep) and 92 bpm), 3.7% PAC burden, rare PVC burden.  Symptoms occurred with bradycardia, sinus rhythm, and PACs.  Last seen by Dr. Jenene Slicker on December 17, 2023.  She was asymptomatic regarding her heart rate.  Was noted to have a chronotropic competence in clinic. Asymptomatic regarding her heart rate. No indication for PPM at the time. TSH WNL from 12/2022.   Today she presents for scheduled follow-up. She states she is doing well. Continues to note dizziness when bending over, stable over time. Denies any chest pain, shortness of breath, palpitations, syncope, presyncope, dizziness, orthopnea, PND, swelling or significant weight changes, acute bleeding, or claudication. Says she is no longer on statin therapy as she says her cholesterol numbers resolved, no longer taking a statin.   ROS: Negative.  See HPI. FH: Does have family history of heart disease.  Her paternal grandfather had the first pacemaker and MI, says maternal grandmother also had heart disease but unsure what specifically.  Paternal grandmother had a history of multiple strokes.  Studies Reviewed: Marland Kitchen    EKG: EKG is not ordered today.   Cardiac monitor 12/2023:    Patch wear time was for 13 days and 1 hour.   Normal sinus rhythm ranging from 28 to 92 bpm with an average HR 41 bpm.   No atrial or ventricular arrhythmias.   Junctional rhythm is noted during sleep, 28 bpm. No  AV block or pauses.   3.7% PAC burden and 1.2% PVC burden.   Symptoms correlated with NSR (48 bpm) and PAC.  Echo 11/2023:  1. Left ventricular ejection fraction, by estimation, is 60 to 65%. The  left ventricle has normal function. The left ventricle has no regional  wall motion abnormalities. Left ventricular diastolic parameters were  normal.   2. Right ventricular systolic function is normal. The right ventricular  size is normal. Tricuspid regurgitation signal is inadequate for assessing  PA pressure.   3. The mitral valve is degenerative. No evidence of mitral valve  regurgitation. No evidence of mitral stenosis.   4. The aortic valve is tricuspid. Aortic valve regurgitation is not  visualized. No aortic stenosis is present.   5. The inferior vena cava is normal in size with greater than 50%  respiratory variability, suggesting right atrial pressure of 3 mmHg.   Comparison(s): No prior Echocardiogram.  Physical Exam:   VS:  BP 114/62 (BP Location: Left Arm, Cuff Size: Normal)   Pulse (!) 42   Ht 5\' 4"  (1.626 m)   Wt 115 lb 9.6 oz (52.4 kg)   SpO2 96%   BMI 19.84 kg/m    Wt Readings from Last 3 Encounters:  04/07/24 115 lb 9.6 oz (52.4 kg)  12/17/23 112 lb 3.2 oz (50.9 kg)  10/24/23 112 lb 3.2 oz (50.9 kg)    Orthostatic vitals: Lying: 126/71, 40 bpm Sitting: 122/57, 41 bpm Standing: 102/66, 40 bpm Standing x 3 minutes: 1 3466,  44 bpm  GEN: Well nourished, well developed in no acute distress NECK: No JVD; No carotid bruits CARDIAC: S1/S2, slow rate and regular rhythm, no murmurs, rubs, gallops RESPIRATORY:  Clear to auscultation without rales, wheezing or rhonchi  ABDOMEN: Soft, non-tender, non-distended EXTREMITIES:  No edema; No deformity   ASSESSMENT AND PLAN: .    Severe sinus bradycardia Longstanding history.  Heart rate is around baseline at 42 bpm.  See past monitor and echocardiogram report noted above.  She is asymptomatic with this.  Denies any  symptoms.  Want to avoid AV nodal blockers.  She is not on any medication that would be making her heart rate slow currently. Will continue to monitor.  Care and ED precautions discussed.  HTN, positional dizziness Blood pressure stable.  Does admit to some positional dizziness-see orthostatics noted above.  Did not meet criteria for orthostatic hypotension.  Will reduce amlodipine to 2.5 mg daily.  No other medication changes at this time. Heart healthy diet and regular cardiovascular exercise encouraged. Care and ED precautions discussed.  HLD Recent labs reviewed with PCP.  LDL 121.  No longer on statin therapy.  Due to her elevated ASCVD risk score, discussed importance of statin medication reduce future risk of MI/stroke.  She verbalized understanding.  Tolerating simvastatin well in the past.  Will restart simvastatin 20 mg daily and obtain FLP/LFT in 2 months. Heart healthy diet and regular cardiovascular exercise encouraged.    Dispo: Follow-up with Dr. Jenene Slicker or APP in 6 months or sooner if anything changes.  Signed, Sharlene Dory, NP

## 2024-04-07 NOTE — Patient Instructions (Addendum)
 Medication Instructions:  Your physician has recommended you make the following change in your medication:  Please start Simvastatin 20 Mg daily  Please Decrease Amlodipine to 2.5 mg daily    Labwork: In 2 months at [facility]   Testing/Procedures: None   Follow-Up: Your physician recommends that you schedule a follow-up appointment in: 6 months   Any Other Special Instructions Will Be Listed Below (If Applicable).  If you need a refill on your cardiac medications before your next appointment, please call your pharmacy.

## 2024-05-21 ENCOUNTER — Encounter (HOSPITAL_COMMUNITY): Payer: Self-pay | Admitting: Psychiatry

## 2024-05-21 ENCOUNTER — Telehealth (INDEPENDENT_AMBULATORY_CARE_PROVIDER_SITE_OTHER): Payer: Medicare (Managed Care) | Admitting: Psychiatry

## 2024-05-21 DIAGNOSIS — F319 Bipolar disorder, unspecified: Secondary | ICD-10-CM

## 2024-05-21 MED ORDER — PAROXETINE HCL 40 MG PO TABS: 40.0000 mg | ORAL_TABLET | Freq: Every morning | ORAL | 3 refills | Status: DC

## 2024-05-21 MED ORDER — GABAPENTIN 300 MG PO CAPS: 300.0000 mg | ORAL_CAPSULE | Freq: Two times a day (BID) | ORAL | 3 refills | Status: DC

## 2024-05-21 MED ORDER — TRAZODONE HCL 150 MG PO TABS: 150.0000 mg | ORAL_TABLET | Freq: Every day | ORAL | 2 refills | Status: DC

## 2024-05-21 MED ORDER — ALPRAZOLAM 0.25 MG PO TABS: 0.2500 mg | ORAL_TABLET | Freq: Two times a day (BID) | ORAL | 2 refills | Status: DC | PRN

## 2024-05-21 NOTE — Progress Notes (Signed)
 Virtual Visit via Telephone Note  I connected with Katherine Middleton on 05/21/24 at  1:40 PM EDT by telephone and verified that I am speaking with the correct person using two identifiers.  Location: Patient: home Provider: office   I discussed the limitations, risks, security and privacy concerns of performing an evaluation and management service by telephone and the availability of in person appointments. I also discussed with the patient that there may be a patient responsible charge related to this service. The patient expressed understanding and agreed to proceed.      I discussed the assessment and treatment plan with the patient. The patient was provided an opportunity to ask questions and all were answered. The patient agreed with the plan and demonstrated an understanding of the instructions.   The patient was advised to call back or seek an in-person evaluation if the symptoms worsen or if the condition fails to improve as anticipated.  I provided 20 minutes of non-face-to-face time during this encounter.   Katherine Annas, MD  [facility] MD/PA/NP OP Progress Note  05/21/2024 1:53 PM Katherine Middleton  MRN:  [id number]  Chief Complaint:  Chief Complaint  Patient presents with   Depression   Anxiety   Manic Behavior   Follow-up   HPI: This patient is a 73 year old widowed Caucasian female who lives alone in [place].  She is on disability for fibromyalgia arthritis and bipolar disorder.  The patient returns for follow-up after 4 months regarding her bipolar disorder.  She states she is generally doing well.  Her elderly dog has been urinating all over the house and she may have to put them down.  This is her biggest concern today.  Her health has been stable.  She is still followed by cardiology because of bradycardia but they have not recommended any new changes other than decreasing her amlodipine .  She is eating a little bit better and has gained 3 pounds and is up to 115  pounds.  The patient denies significant depression of anxiety racing thoughts or agitation.  She is sleeping well she denies thoughts of self-harm or suicide Visit Diagnosis:    ICD-10-CM   1. Bipolar 1 disorder (HCC)  F31.9 traZODone  (DESYREL ) 150 MG tablet    PARoxetine  (PAXIL ) 40 MG tablet      Past Psychiatric History: Long-term outpatient treatment  Past Medical History:  Past Medical History:  Diagnosis Date   Anxiety    Arthritis    Bipolar disorder (HCC)    Depression    Fibromyalgia    GERD (gastroesophageal reflux disease) 12/31/2002   HLD (hyperlipidemia)    diet controlled - no meds   HSV infection    Liver mass 07/2021   Osteoarthritis    Prolonged Q-T interval on ECG 02/2021   Sinusitis 12/31/2012   Wears glasses     Past Surgical History:  Procedure Laterality Date   BIOPSY  08/24/2021   Procedure: BIOPSY;  Surgeon: Normie Becton., MD;  Location: [facility]COPY;  Service: Gastroenterology;;   BIOPSY  09/11/2021   Procedure: BIOPSY;  Surgeon: Normie Becton., MD;  Location: [facility] ENDOSCOPY;  Service: Gastroenterology;;   ESOPHAGOGASTRODUODENOSCOPY (EGD) WITH PROPOFOL  N/A 08/11/2021   Procedure: ESOPHAGOGASTRODUODENOSCOPY (EGD) WITH PROPOFOL ;  Surgeon: Alvis Jourdain, MD;  Location: [facility] ENDOSCOPY;  Service: Endoscopy;  Laterality: N/A;   ESOPHAGOGASTRODUODENOSCOPY (EGD) WITH PROPOFOL  N/A 08/24/2021   Procedure: ESOPHAGOGASTRODUODENOSCOPY (EGD) WITH PROPOFOL ;  Surgeon: Brice Campi Albino Alu., MD;  Location: [facility] ENDOSCOPY;  Service: Gastroenterology;  Laterality:  N/A;   ESOPHAGOGASTRODUODENOSCOPY (EGD) WITH PROPOFOL  N/A 09/11/2021   Procedure: ESOPHAGOGASTRODUODENOSCOPY (EGD) WITH PROPOFOL ;  Surgeon: Brice Campi Albino Alu., MD;  Location: [facility] ENDOSCOPY;  Service: Gastroenterology;  Laterality: N/A;   EUS N/A 08/24/2021   Procedure: UPPER ENDOSCOPIC ULTRASOUND (EUS) LINEAR;  Surgeon: Normie Becton., MD;  Location: [facility] ENDOSCOPY;  Service:  Gastroenterology;  Laterality: N/A;   EUS N/A 09/11/2021   Procedure: UPPER ENDOSCOPIC ULTRASOUND (EUS) LINEAR;  Surgeon: Normie Becton., MD;  Location: [facility] ENDOSCOPY;  Service: Gastroenterology;  Laterality: N/A;   FINE NEEDLE ASPIRATION  08/24/2021   Procedure: FINE NEEDLE ASPIRATION;  Surgeon: Brice Campi Albino Alu., MD;  Location: [facility] ENDOSCOPY;  Service: Gastroenterology;;   FINE NEEDLE ASPIRATION  09/11/2021   Procedure: FINE NEEDLE ASPIRATION (FNA) LINEAR;  Surgeon: Normie Becton., MD;  Location: [facility] ENDOSCOPY;  Service: Gastroenterology;;  Sindy Dues. not in system to charge   TONSILLECTOMY  04/08/57   some date around then   TUBAL LIGATION  10/21/1984   UPPER ESOPHAGEAL ENDOSCOPIC ULTRASOUND (EUS) N/A 08/11/2021   Procedure: UPPER ESOPHAGEAL ENDOSCOPIC ULTRASOUND (EUS);  Surgeon: Alvis Jourdain, MD;  Location: [facility]OS[facility];  Service: Endoscopy;  Laterality: N/A;    Family Psychiatric History: See below  Family History:  Family History  Problem Relation Age of Onset   Bipolar disorder Sister    Anxiety disorder Sister    Dementia Sister 17   Paranoid behavior Sister    Bipolar disorder Father    Schizophrenia Father    Alcohol abuse Maternal Uncle    Alcohol abuse Paternal Uncle    Anxiety disorder Mother    ADD / ADHD Other    ADD / ADHD Other    Healthy Other    OCD Neg Hx    Seizures Neg Hx    Sexual abuse Neg Hx    Physical abuse Neg Hx     Social History:  Social History   Socioeconomic History   Marital status: Married    Spouse name: Not on file   Number of children: Not on file   Years of education: Not on file   Highest education level: Not on file  Occupational History   Not on file  Tobacco Use   Smoking status: Some Days    Current packs/day: 0.50    Average packs/day: 0.5 packs/day for 30.0 years (15.0 ttl pk-yrs)    Types: Cigarettes   Smokeless tobacco: Never   Tobacco comments:    11-15 cigs a day as of 05/01/2013  Vaping Use    Vaping status: Never Used  Substance and Sexual Activity   Alcohol use: Not Currently    Comment: pts son reports several drinks throughout the week   Drug use: No   Sexual activity: Yes    Partners: Male    Birth control/protection: Post-menopausal  Other Topics Concern   Not on file  Social History Narrative   Left handed    Lives alone    Social Drivers of Health   Financial Resource Strain: Not on file  Food Insecurity: No Food Insecurity (01/01/2023)   Hunger Vital Sign    Worried About Running Out of Food in the Last Year: Never true    Ran Out of Food in the Last Year: Never true  Transportation Needs: No Transportation Needs (01/01/2023)   PRAPARE - Administrator, Civil Service (Medical): No    Lack of Transportation (Non-Medical): No  Physical Activity: Not on file  Stress: Not on file  Social Connections: Not on file    Allergies:  Allergies  Allergen Reactions   Amoxicillin-Pot Clavulanate Anaphylaxis and Swelling   Cephalosporins Anaphylaxis    Other reaction(s): Unknown   Codeine Itching   Cymbalta [Duloxetine Hcl] Other (See Comments)    GERD   Duloxetine     Other reaction(s): Other (See Comments), Unknown GERD    Oxycodone Itching and Other (See Comments)    "OUT THERE", climbing the walls   Penicillins Anaphylaxis and Swelling    Throat swelling Reaction: 8-10 years ago   Baclofen  Other (See Comments)    Anxiety got much worse and possibly ppted manic episode Other reaction(s): Unknown   Povidone Iodine Itching and Other (See Comments)    Particularly if in a douche  Other reaction(s): Unknown   Flonase [Fluticasone]     Makes eye pressure to increase   Propoxyphene Itching    Anxiety/jittery    Sulfa Antibiotics Swelling   Sulfasalazine Swelling    Other reaction(s): Unknown   Tramadol     Other reaction(s): Other (See Comments), Unknown Patient states that this makes her nervous    Amitriptyline  Other (See Comments)     Caused bowels to empty quickly and couldn't sleep on it. Other reaction(s): Other (See Comments), Unknown Caused bowels to empty quickly and couldn't sleep on it. Caused bowels to empty quickly and couldn't sleep on it. Caused bowels to empty quickly and couldn't sleep on it.    Aspirin Hives and Rash   Latex Swelling and Other (See Comments)    If in the mouth, it swells   Nsaids Nausea Only and Other (See Comments)    Almost passing out, bad acid reflux Other reaction(s): Other (See Comments) other   Tolmetin Nausea Only    Other reaction(s): Other (See Comments), Unknown Almost passing out, bad acid reflux Almost passing out, bad acid reflux     Metabolic Disorder Labs: No results found for: "HGBA1C", "MPG" No results found for: "PROLACTIN" No results found for: "CHOL", "TRIG", "HDL", "CHOLHDL", "VLDL", "LDLCALC" Lab Results  Component Value Date   TSH 2.099 01/01/2023    Therapeutic Level Labs: No results found for: "LITHIUM" No results found for: "VALPROATE" No results found for: "CBMZ"  Current Medications: Current Outpatient Medications  Medication Sig Dispense Refill   acetaminophen  (TYLENOL ) 650 MG CR tablet Take 1,300 mg by mouth 2 (two) times daily as needed for pain.     ALPRAZolam  (XANAX ) 0.25 MG tablet Take 1 tablet (0.25 mg total) by mouth 2 (two) times daily as needed for anxiety. 60 tablet 2   amLODipine  (NORVASC ) 2.5 MG tablet Take 1 tablet (2.5 mg total) by mouth daily. 90 tablet 1   cetirizine (ZYRTEC) 10 MG tablet Take 10 mg by mouth at bedtime.     Cholecalciferol  (VITAMIN D3) 125 MCG (5000 UT) TABS Take 5,000 Units by mouth daily.     feeding supplement (ENSURE ENLIVE / ENSURE PLUS) LIQD Take 237 mLs by mouth 2 (two) times daily between meals. (Patient not taking: Reported on 04/07/2024) 237 mL 12   gabapentin  (NEURONTIN ) 300 MG capsule Take 1 capsule (300 mg total) by mouth 2 (two) times daily. 180 capsule 3   Multiple Vitamin (MULTIVITAMIN WITH  MINERALS) TABS tablet Take 1 tablet by mouth daily.     omeprazole (PRILOSEC) 20 MG capsule Take 20 mg by mouth as needed.     Pancrelipase , Lip-Prot-Amyl, (CREON ) 24000-76000 units CPEP Take 2 capsules with each meal, 1 after starting/1 when  finished eating, and 1 capsule with snack after starting to eat.     PARoxetine  (PAXIL ) 40 MG tablet Take 1 tablet (40 mg total) by mouth every morning. 90 tablet 3   Polyethyl Glyc-Propyl Glyc PF (SYSTANE ULTRA PF) 0.4-0.3 % SOLN Place 1 drop into both eyes daily as needed (Dry eye).     simvastatin  (ZOCOR ) 20 MG tablet Take 1 tablet (20 mg total) by mouth at bedtime. 90 tablet 1   sodium chloride  (OCEAN) 0.65 % SOLN nasal spray Place 1 spray into both nostrils as needed for congestion.     tiZANidine  (ZANAFLEX ) 4 MG tablet Take 4 mg by mouth daily as needed (muscle pain).     traZODone  (DESYREL ) 150 MG tablet Take 1 tablet (150 mg total) by mouth at bedtime. 90 tablet 2   vitamin B-12 (CYANOCOBALAMIN ) 1000 MCG tablet Take 1,000 mcg by mouth daily.     No current facility-administered medications for this visit.     Musculoskeletal: Strength & Muscle Tone: na Gait & Station: na Patient leans: N/A  Psychiatric Specialty Exam: Review of Systems  There were no vitals taken for this visit.There is no height or weight on file to calculate BMI.  General Appearance: NA  Eye Contact:  Good  Speech:  Clear and Coherent  Volume:  Normal  Mood:  Euthymic  Affect:  NA  Thought Process:  Goal Directed  Orientation:  Full (Time, Place, and Person)  Thought Content: WDL   Suicidal Thoughts:  No  Homicidal Thoughts:  No  Memory:  Immediate;   Good Recent;   Good Remote;   NA  Judgement:  Good  Insight:  Fair  Psychomotor Activity:  Decreased  Concentration:  Concentration: Good and Attention Span: Good  Recall:  Good  Fund of Knowledge: Good  Language: Good  Akathisia:  No  Handed:  Right  AIMS (if indicated): not done  Assets:  Communication  Skills Desire for Improvement Resilience Social Support  ADL's:  Intact  Cognition: WNL  Sleep:  Good   Screenings: PHQ2-9    Flowsheet Row Video Visit from 04/19/2022 in [facility] [place] Video Visit from 01/24/2022 in [facility] [place] Video Visit from 10/25/2021 in [facility] [place] Video Visit from 07/25/2021 in [facility] at [place] Video Visit from 05/02/2021 in [facility] Total Score 0 0 0 0 0      Flowsheet Row ED to Hosp-Admission (Discharged) from 12/31/2022 in [facility] ED from 12/25/2022 in [facility] at [facility] Video Visit from 04/19/2022 in [facility] at [place]  C-SSRS RISK CATEGORY No Risk No Risk No Risk        Assessment and Plan: This patient is a 73 year old female with a history of bipolar disorder and anxiety.  She seems much more lucid and coherent today.  She will continue Paxil  40 mg daily for depression, gabapentin  300 mg twice daily for anxiety and fibromyalgia, trazodone  100 mg at bedtime for sleep and Xanax  0.25 mg 3 times daily for anxiety.  She will return to see me in 3 months  Collaboration of Care: Collaboration of Care: Primary Care Provider AEB notes are shared with PCP through the epic system  Patient/Guardian was advised Release of Information must be obtained prior to any record release in order to collaborate their care with  an outside provider. Patient/Guardian was advised if they have not already done so to contact the registration department to sign all necessary forms in order for us  to release information regarding their care.   Consent: Patient/Guardian gives verbal consent for treatment and assignment of benefits for services provided during this visit. Patient/Guardian  expressed understanding and agreed to proceed.    Katherine Annas, MD 05/21/2024, 1:53 PM

## 2024-09-01 ENCOUNTER — Other Ambulatory Visit (HOSPITAL_COMMUNITY): Payer: Self-pay | Admitting: Psychiatry

## 2024-09-09 ENCOUNTER — Other Ambulatory Visit: Payer: Self-pay | Admitting: Family Medicine

## 2024-09-09 DIAGNOSIS — K8689 Other specified diseases of pancreas: Secondary | ICD-10-CM | POA: Diagnosis not present

## 2024-09-09 DIAGNOSIS — Z1231 Encounter for screening mammogram for malignant neoplasm of breast: Secondary | ICD-10-CM

## 2024-09-09 DIAGNOSIS — R739 Hyperglycemia, unspecified: Secondary | ICD-10-CM | POA: Diagnosis not present

## 2024-09-09 DIAGNOSIS — E7800 Pure hypercholesterolemia, unspecified: Secondary | ICD-10-CM | POA: Diagnosis not present

## 2024-09-09 DIAGNOSIS — Z0000 Encounter for general adult medical examination without abnormal findings: Secondary | ICD-10-CM | POA: Diagnosis not present

## 2024-09-09 DIAGNOSIS — F3170 Bipolar disorder, currently in remission, most recent episode unspecified: Secondary | ICD-10-CM | POA: Diagnosis not present

## 2024-09-09 DIAGNOSIS — F411 Generalized anxiety disorder: Secondary | ICD-10-CM | POA: Diagnosis not present

## 2024-09-09 DIAGNOSIS — I10 Essential (primary) hypertension: Secondary | ICD-10-CM | POA: Diagnosis not present

## 2024-09-14 ENCOUNTER — Other Ambulatory Visit: Payer: Self-pay | Admitting: Nurse Practitioner

## 2024-10-08 ENCOUNTER — Ambulatory Visit: Payer: Medicare (Managed Care) | Admitting: Nurse Practitioner

## 2024-11-02 ENCOUNTER — Telehealth: Payer: Self-pay | Admitting: Internal Medicine

## 2024-11-02 MED ORDER — SIMVASTATIN 20 MG PO TABS: 20.0000 mg | ORAL_TABLET | Freq: Every day | ORAL | 0 refills | Status: DC

## 2024-11-02 NOTE — Telephone Encounter (Signed)
 Refill Simvastatin  sent to [facility].

## 2024-11-02 NOTE — Telephone Encounter (Signed)
*  STAT* If patient is at the pharmacy, call can be transferred to refill team.   1. Which medications need to be refilled? (please list name of each medication and dose if known)  simvastatin  (ZOCOR ) 20 MG tablet (Expired)    2. Which pharmacy/location (including street and city if local pharmacy) is medication to be sent to?  [facility] - [place], [place] - [address]      3. Do they need a 30 day or 90 day supply? 90 day

## 2024-11-05 ENCOUNTER — Ambulatory Visit: Payer: Medicare (Managed Care) | Admitting: Internal Medicine

## 2024-11-17 ENCOUNTER — Telehealth (INDEPENDENT_AMBULATORY_CARE_PROVIDER_SITE_OTHER): Payer: Medicare (Managed Care) | Admitting: Psychiatry

## 2024-11-17 ENCOUNTER — Encounter (HOSPITAL_COMMUNITY): Payer: Self-pay | Admitting: Psychiatry

## 2024-11-17 DIAGNOSIS — F319 Bipolar disorder, unspecified: Secondary | ICD-10-CM

## 2024-11-17 MED ORDER — TRAZODONE HCL 150 MG PO TABS: 150.0000 mg | ORAL_TABLET | Freq: Every day | ORAL | 2 refills | Status: AC

## 2024-11-17 MED ORDER — ALPRAZOLAM 0.25 MG PO TABS: 0.2500 mg | ORAL_TABLET | Freq: Two times a day (BID) | ORAL | 3 refills | Status: AC | PRN

## 2024-11-17 MED ORDER — GABAPENTIN 300 MG PO CAPS: 300.0000 mg | ORAL_CAPSULE | Freq: Two times a day (BID) | ORAL | 3 refills | Status: AC

## 2024-11-17 MED ORDER — PAROXETINE HCL 40 MG PO TABS
40.0000 mg | ORAL_TABLET | Freq: Every morning | ORAL | 3 refills | Status: AC
Start: 2024-11-17 — End: ?

## 2024-11-17 NOTE — Progress Notes (Signed)
 Virtual Visit via Telephone Note  I connected with Katherine Middleton on 11/17/24 at  2:00 PM EST by telephone and verified that I am speaking with the correct person using two identifiers.  Location: Patient: home Provider: office   I discussed the limitations, risks, security and privacy concerns of performing an evaluation and management service by telephone and the availability of in person appointments. I also discussed with the patient that there may be a patient responsible charge related to this service. The patient expressed understanding and agreed to proceed.      I discussed the assessment and treatment plan with the patient. The patient was provided an opportunity to ask questions and all were answered. The patient agreed with the plan and demonstrated an understanding of the instructions.   The patient was advised to call back or seek an in-person evaluation if the symptoms worsen or if the condition fails to improve as anticipated.  I provided 20 minutes of non-face-to-face time during this encounter.   Barnie Gull, MD  [facility] MD/PA/NP OP Progress Note  11/17/2024 2:21 PM Katherine Middleton  MRN:  [id number]  Chief Complaint:  Chief Complaint  Patient presents with   Anxiety   Depression   Manic Behavior   Follow-up   HPI: This patient is a 73 year old widowed Caucasian female who lives alone in [place]. She is on disability for fibromyalgia arthritis and bipolar disorder.   The returns for follow-up after 6 months regarding her bipolar disorder.  She states she is generally doing well.  Her mood has been stable and she denies depression and manic symptoms.  She did have to put her elderly dog down which has been difficult.  She recently saw her PCP.  They are concerned because her A1c is slightly high at 6.5 and she is trying to cut down on her sugar.  She is still only at 115 pounds although she has gained up from the 97 pounds she was a couple years ago.  She is  sleeping well and denies any thoughts of self-harm or suicide. Visit Diagnosis:    ICD-10-CM   1. Bipolar 1 disorder (HCC)  F31.9 PARoxetine  (PAXIL ) 40 MG tablet    traZODone  (DESYREL ) 150 MG tablet      Past Psychiatric History: Long-term outpatient treatment  Past Medical History:  Past Medical History:  Diagnosis Date   Anxiety    Arthritis    Bipolar disorder (HCC)    Depression    Fibromyalgia    GERD (gastroesophageal reflux disease) 12/31/2002   HLD (hyperlipidemia)    diet controlled - no meds   HSV infection    Liver mass 07/2021   Osteoarthritis    Prolonged Q-T interval on ECG 02/2021   Sinusitis 12/31/2012   Wears glasses     Past Surgical History:  Procedure Laterality Date   BIOPSY  08/24/2021   Procedure: BIOPSY;  Surgeon: Wilhelmenia Aloha Raddle., MD;  Location: [facility] ENDOSCOPY;  Service: Gastroenterology;;   BIOPSY  09/11/2021   Procedure: BIOPSY;  Surgeon: Wilhelmenia Aloha Raddle., MD;  Location: [facility] ENDOSCOPY;  Service: Gastroenterology;;   ESOPHAGOGASTRODUODENOSCOPY (EGD) WITH PROPOFOL  N/A 08/11/2021   Procedure: ESOPHAGOGASTRODUODENOSCOPY (EGD) WITH PROPOFOL ;  Surgeon: Rollin Dover, MD;  Location: [facility] ENDOSCOPY;  Service: Endoscopy;  Laterality: N/A;   ESOPHAGOGASTRODUODENOSCOPY (EGD) WITH PROPOFOL  N/A 08/24/2021   Procedure: ESOPHAGOGASTRODUODENOSCOPY (EGD) WITH PROPOFOL ;  Surgeon: Wilhelmenia Aloha Raddle., MD;  Location: [facility] ENDOSCOPY;  Service: Gastroenterology;  Laterality: N/A;   ESOPHAGOGASTRODUODENOSCOPY (EGD) WITH PROPOFOL  N/A  09/11/2021   Procedure: ESOPHAGOGASTRODUODENOSCOPY (EGD) WITH PROPOFOL ;  Surgeon: Wilhelmenia Aloha Raddle., MD;  Location: [facility] ENDOSCOPY;  Service: Gastroenterology;  Laterality: N/A;   EUS N/A 08/24/2021   Procedure: UPPER ENDOSCOPIC ULTRASOUND (EUS) LINEAR;  Surgeon: Wilhelmenia Aloha Raddle., MD;  Location: [facility] ENDOSCOPY;  Service: Gastroenterology;  Laterality: N/A;   EUS N/A 09/11/2021   Procedure: UPPER ENDOSCOPIC ULTRASOUND (EUS)  LINEAR;  Surgeon: Wilhelmenia Aloha Raddle., MD;  Location: [facility] ENDOSCOPY;  Service: Gastroenterology;  Laterality: N/A;   FINE NEEDLE ASPIRATION  08/24/2021   Procedure: FINE NEEDLE ASPIRATION;  Surgeon: Wilhelmenia Aloha Raddle., MD;  Location: [facility] ENDOSCOPY;  Service: Gastroenterology;;   FINE NEEDLE ASPIRATION  09/11/2021   Procedure: FINE NEEDLE ASPIRATION (FNA) LINEAR;  Surgeon: Wilhelmenia Aloha Raddle., MD;  Location: [facility] ENDOSCOPY;  Service: Gastroenterology;;  niles pea. not in system to charge   TONSILLECTOMY  04/08/57   some date around then   TUBAL LIGATION  10/21/1984   UPPER ESOPHAGEAL ENDOSCOPIC ULTRASOUND (EUS) N/A 08/11/2021   Procedure: UPPER ESOPHAGEAL ENDOSCOPIC ULTRASOUND (EUS);  Surgeon: Rollin Dover, MD;  Location: [facility] ENDOSCOPY;  Service: Endoscopy;  Laterality: N/A;    Family Psychiatric History: See below  Family History:  Family History  Problem Relation Age of Onset   Bipolar disorder Sister    Anxiety disorder Sister    Dementia Sister 32   Paranoid behavior Sister    Bipolar disorder Father    Schizophrenia Father    Alcohol abuse Maternal Uncle    Alcohol abuse Paternal Uncle    Anxiety disorder Mother    ADD / ADHD Other    ADD / ADHD Other    Healthy Other    OCD Neg Hx    Seizures Neg Hx    Sexual abuse Neg Hx    Physical abuse Neg Hx     Social History:  Social History   Socioeconomic History   Marital status: Married    Spouse name: Not on file   Number of children: Not on file   Years of education: Not on file   Highest education level: Not on file  Occupational History   Not on file  Tobacco Use   Smoking status: Some Days    Current packs/day: 0.50    Average packs/day: 0.5 packs/day for 30.0 years (15.0 ttl pk-yrs)    Types: Cigarettes   Smokeless tobacco: Never   Tobacco comments:    11-15 cigs a day as of 05/01/2013  Vaping Use   Vaping status: Never Used  Substance and Sexual Activity   Alcohol use: Not Currently    Comment:  pts son reports several drinks throughout the week   Drug use: No   Sexual activity: Yes    Partners: Male    Birth control/protection: Post-menopausal  Other Topics Concern   Not on file  Social History Narrative   Left handed    Lives alone    Social Drivers of Health   Financial Resource Strain: Not on file  Food Insecurity: Low Risk  (09/09/2024)   Received from [facility]   Hunger Vital Sign    Within the past 12 months, you worried that your food would run out before you got money to buy more: Never true    Within the past 12 months, the food you bought just didn't last and you didn't have money to get more. : Never true  Transportation Needs: No Transportation Needs (09/09/2024)   Received from [facility]  In the past 12 months, has lack of reliable transportation kept you from medical appointments, meetings, work or from getting things needed for daily living? : No  Physical Activity: Not on file  Stress: Not on file  Social Connections: Not on file    Allergies:  Allergies  Allergen Reactions   Amoxicillin-Pot Clavulanate Anaphylaxis and Swelling   Cephalosporins Anaphylaxis    Other reaction(s): Unknown   Codeine Itching   Cymbalta [Duloxetine Hcl] Other (See Comments)    GERD   Duloxetine     Other reaction(s): Other (See Comments), Unknown GERD    Oxycodone Itching and Other (See Comments)    OUT THERE, climbing the walls   Penicillins Anaphylaxis and Swelling    Throat swelling Reaction: 8-10 years ago   Baclofen  Other (See Comments)    Anxiety got much worse and possibly ppted manic episode Other reaction(s): Unknown   Povidone Iodine Itching and Other (See Comments)    Particularly if in a douche  Other reaction(s): Unknown   Flonase [Fluticasone]     Makes eye pressure to increase   Propoxyphene Itching    Anxiety/jittery    Sulfa Antibiotics Swelling   Sulfasalazine Swelling    Other reaction(s): Unknown   Tramadol      Other reaction(s): Other (See Comments), Unknown Patient states that this makes her nervous    Amitriptyline  Other (See Comments)    Caused bowels to empty quickly and couldn't sleep on it. Other reaction(s): Other (See Comments), Unknown Caused bowels to empty quickly and couldn't sleep on it. Caused bowels to empty quickly and couldn't sleep on it. Caused bowels to empty quickly and couldn't sleep on it.    Aspirin Hives and Rash   Latex Swelling and Other (See Comments)    If in the mouth, it swells   Nsaids Nausea Only and Other (See Comments)    Almost passing out, bad acid reflux Other reaction(s): Other (See Comments) other   Tolmetin Nausea Only    Other reaction(s): Other (See Comments), Unknown Almost passing out, bad acid reflux Almost passing out, bad acid reflux     Metabolic Disorder Labs: No results found for: HGBA1C, MPG No results found for: PROLACTIN No results found for: CHOL, TRIG, HDL, CHOLHDL, VLDL, LDLCALC Lab Results  Component Value Date   TSH 2.099 01/01/2023    Therapeutic Level Labs: No results found for: LITHIUM No results found for: VALPROATE No results found for: CBMZ  Current Medications: Current Outpatient Medications  Medication Sig Dispense Refill   acetaminophen  (TYLENOL ) 650 MG CR tablet Take 1,300 mg by mouth 2 (two) times daily as needed for pain.     ALPRAZolam  (XANAX ) 0.25 MG tablet Take 1 tablet (0.25 mg total) by mouth 2 (two) times daily as needed for anxiety. 60 tablet 3   amLODipine  (NORVASC ) 2.5 MG tablet Take 1 tablet (2.5 mg total) by mouth daily. 90 tablet 1   cetirizine (ZYRTEC) 10 MG tablet Take 10 mg by mouth at bedtime.     Cholecalciferol  (VITAMIN D3) 125 MCG (5000 UT) TABS Take 5,000 Units by mouth daily.     feeding supplement (ENSURE ENLIVE / ENSURE PLUS) LIQD Take 237 mLs by mouth 2 (two) times daily between meals. (Patient not taking: Reported on 04/07/2024) 237 mL 12   gabapentin   (NEURONTIN ) 300 MG capsule Take 1 capsule (300 mg total) by mouth 2 (two) times daily. 180 capsule 3   Multiple Vitamin (MULTIVITAMIN WITH MINERALS) TABS tablet Take 1 tablet  by mouth daily.     omeprazole (PRILOSEC) 20 MG capsule Take 20 mg by mouth as needed.     Pancrelipase , Lip-Prot-Amyl, (CREON ) 24000-76000 units CPEP Take 2 capsules with each meal, 1 after starting/1 when finished eating, and 1 capsule with snack after starting to eat.     PARoxetine  (PAXIL ) 40 MG tablet Take 1 tablet (40 mg total) by mouth every morning. 90 tablet 3   Polyethyl Glyc-Propyl Glyc PF (SYSTANE ULTRA PF) 0.4-0.3 % SOLN Place 1 drop into both eyes daily as needed (Dry eye).     simvastatin  (ZOCOR ) 20 MG tablet Take 1 tablet (20 mg total) by mouth at bedtime. 90 tablet 0   sodium chloride  (OCEAN) 0.65 % SOLN nasal spray Place 1 spray into both nostrils as needed for congestion.     tiZANidine  (ZANAFLEX ) 4 MG tablet Take 4 mg by mouth daily as needed (muscle pain).     traZODone  (DESYREL ) 150 MG tablet Take 1 tablet (150 mg total) by mouth at bedtime. 90 tablet 2   vitamin B-12 (CYANOCOBALAMIN ) 1000 MCG tablet Take 1,000 mcg by mouth daily.     No current facility-administered medications for this visit.     Musculoskeletal: Strength & Muscle Tone: na Gait & Station: na Patient leans: N/A  Psychiatric Specialty Exam: Review of Systems  Musculoskeletal:  Positive for arthralgias and myalgias.  All other systems reviewed and are negative.   There were no vitals taken for this visit.There is no height or weight on file to calculate BMI.  General Appearance: na  Eye Contact:  na  Speech:  Clear and Coherent  Volume:  Normal  Mood:  Euthymic  Affect:  NA  Thought Process:  Goal Directed  Orientation:  Full (Time, Place, and Person)  Thought Content: WDL   Suicidal Thoughts:  No  Homicidal Thoughts:  No  Memory:  Immediate;   Good Recent;   Good Remote;   NA  Judgement:  Good  Insight:  Fair   Psychomotor Activity:  Normal  Concentration:  Concentration: Good and Attention Span: Good  Recall:  Good  Fund of Knowledge: Good  Language: Good  Akathisia:  No  Handed:  Right  AIMS (if indicated): not done  Assets:  Communication Skills Desire for Improvement Resilience Social Support Talents/Skills  ADL's:  Intact  Cognition: WNL  Sleep:  Good   Screenings: PHQ2-9    Flowsheet Row Video Visit from 04/19/2022 in [facility] [place] Video Visit from 01/24/2022 in [facility] [place] Video Visit from 10/25/2021 in [facility] [place] Video Visit from 07/25/2021 [facility] at [place] Video Visit from 05/02/2021 in [facility] Total Score 0 0 0 0 0   Flowsheet Row ED to Hosp-Admission (Discharged) from 12/31/2022 in [place] [facility] ED from 12/25/2022 in [facility] Emergency Department at [facility] Video Visit from 04/19/2022 in [facility] at [place]  C-SSRS RISK CATEGORY No Risk No Risk No Risk     Assessment and Plan: This patient is a 73 year old female with a history of bipolar disorder and mild generalized anxiety disorder..  She is doing well on her current regimen she will continue Paxil  40 mg daily for depression, gabapentin  300 mg twice daily for generalized anxiety and fibromyalgia, trazodone  100 mg at bedtime for sleep and Xanax  0.25 mg 2 times daily  as needed for anxiety.  She will return to see me in 4 months  Collaboration of Care: Collaboration of Care: Primary Care Provider AEB notes are shared with PCP through the epic system  Patient/Guardian was advised Release of Information must be obtained prior to any record release in order to collaborate their care with an outside provider. Patient/Guardian was advised if they have not  already done so to contact the registration department to sign all necessary forms in order for us  to release information regarding their care.   Consent: Patient/Guardian gives verbal consent for treatment and assignment of benefits for services provided during this visit. Patient/Guardian expressed understanding and agreed to proceed.    Barnie Gull, MD 11/17/2024, 2:21 PM

## 2024-12-17 ENCOUNTER — Encounter: Payer: Self-pay | Admitting: Internal Medicine

## 2024-12-17 ENCOUNTER — Ambulatory Visit: Payer: Medicare (Managed Care) | Admitting: Internal Medicine

## 2024-12-17 VITALS — BP 126/74 | HR 50 | Ht 64.0 in | Wt 111.0 lb

## 2024-12-17 DIAGNOSIS — R001 Bradycardia, unspecified: Secondary | ICD-10-CM

## 2024-12-17 NOTE — Patient Instructions (Signed)

## 2024-12-17 NOTE — Progress Notes (Signed)
 Cardiology Office Note  Date: 12/17/2024   ID: Katherine, Middleton [birth year], MRN [id number]  PCP:  Debrah Josette ORN., PA-C  Cardiologist:  Diannah SHAUNNA Maywood, MD Electrophysiologist:  None   History of Present Illness: Katherine Middleton is a 73 y.o. female known to have severe sinus bradycardia, HTN is here for follow-up visit.  EKG today showed NSR, PACs.  Has positional dizziness but no exertional dizziness.  No severe fatigue.  No syncope.  No angina or DOE.  Echo in 2024 is within normal limits.  Event monitor unremarkable, no evidence of severe conduction disease, arrhythmias.  Average HR was 41 bpm.  Past Medical History:  Diagnosis Date   Anxiety    Arthritis    Bipolar disorder (HCC)    Depression    Fibromyalgia    GERD (gastroesophageal reflux disease) 12/31/2002   HLD (hyperlipidemia)    diet controlled - no meds   HSV infection    Liver mass 07/2021   Osteoarthritis    Prolonged Q-T interval on ECG 02/2021   Sinusitis 12/31/2012   Wears glasses     Past Surgical History:  Procedure Laterality Date   BIOPSY  08/24/2021   Procedure: BIOPSY;  Surgeon: Wilhelmenia Aloha Raddle., MD;  Location: [facility] ENDOSCOPY;  Service: Gastroenterology;;   BIOPSY  09/11/2021   Procedure: BIOPSY;  Surgeon: Wilhelmenia Aloha Raddle., MD;  Location: [facility] ENDOSCOPY;  Service: Gastroenterology;;   ESOPHAGOGASTRODUODENOSCOPY (EGD) WITH PROPOFOL  N/A 08/11/2021   Procedure: ESOPHAGOGASTRODUODENOSCOPY (EGD) WITH PROPOFOL ;  Surgeon: Rollin Dover, MD;  Location: [facility] ENDOSCOPY;  Service: Endoscopy;  Laterality: N/A;   ESOPHAGOGASTRODUODENOSCOPY (EGD) WITH PROPOFOL  N/A 08/24/2021   Procedure: ESOPHAGOGASTRODUODENOSCOPY (EGD) WITH PROPOFOL ;  Surgeon: Wilhelmenia Aloha Raddle., MD;  Location: [facility] ENDOSCOPY;  Service: Gastroenterology;  Laterality: N/A;   ESOPHAGOGASTRODUODENOSCOPY (EGD) WITH PROPOFOL  N/A 09/11/2021   Procedure: ESOPHAGOGASTRODUODENOSCOPY (EGD) WITH PROPOFOL ;  Surgeon: Wilhelmenia  Aloha Raddle., MD;  Location: [facility] ENDOSCOPY;  Service: Gastroenterology;  Laterality: N/A;   EUS N/A 08/24/2021   Procedure: UPPER ENDOSCOPIC ULTRASOUND (EUS) LINEAR;  Surgeon: Wilhelmenia Aloha Raddle., MD;  Location: [facility] ENDOSCOPY;  Service: Gastroenterology;  Laterality: N/A;   EUS N/A 09/11/2021   Procedure: UPPER ENDOSCOPIC ULTRASOUND (EUS) LINEAR;  Surgeon: Wilhelmenia Aloha Raddle., MD;  Location: [facility] ENDOSCOPY;  Service: Gastroenterology;  Laterality: N/A;   FINE NEEDLE ASPIRATION  08/24/2021   Procedure: FINE NEEDLE ASPIRATION;  Surgeon: Wilhelmenia Aloha Raddle., MD;  Location: [facility] ENDOSCOPY;  Service: Gastroenterology;;   FINE NEEDLE ASPIRATION  09/11/2021   Procedure: FINE NEEDLE ASPIRATION (FNA) LINEAR;  Surgeon: Wilhelmenia Aloha Raddle., MD;  Location: [facility] ENDOSCOPY;  Service: Gastroenterology;;  niles pea. not in system to charge   TONSILLECTOMY  04/08/57   some date around then   TUBAL LIGATION  10/21/1984   UPPER ESOPHAGEAL ENDOSCOPIC ULTRASOUND (EUS) N/A 08/11/2021   Procedure: UPPER ESOPHAGEAL ENDOSCOPIC ULTRASOUND (EUS);  Surgeon: Rollin Dover, MD;  Location: [facility];  Service: Endoscopy;  Laterality: N/A;    Current Outpatient Medications  Medication Sig Dispense Refill   acetaminophen  (TYLENOL ) 650 MG CR tablet Take 1,300 mg by mouth 2 (two) times daily as needed for pain.     ALPRAZolam  (XANAX ) 0.25 MG tablet Take 1 tablet (0.25 mg total) by mouth 2 (two) times daily as needed for anxiety. 60 tablet 3   amLODipine  (NORVASC ) 2.5 MG tablet Take 1 tablet (2.5 mg total) by mouth daily. 90 tablet 1   cetirizine (ZYRTEC) 10 MG tablet Take 10 mg by mouth at bedtime.  Cholecalciferol  (VITAMIN D3) 125 MCG (5000 UT) TABS Take 5,000 Units by mouth daily.     feeding supplement (ENSURE ENLIVE / ENSURE PLUS) LIQD Take 237 mLs by mouth 2 (two) times daily between meals. 237 mL 12   gabapentin  (NEURONTIN ) 300 MG capsule Take 1 capsule (300 mg total) by mouth 2 (two) times daily. 180 capsule 3    Multiple Vitamin (MULTIVITAMIN WITH MINERALS) TABS tablet Take 1 tablet by mouth daily.     omeprazole (PRILOSEC) 20 MG capsule Take 20 mg by mouth as needed.     Pancrelipase , Lip-Prot-Amyl, (CREON ) 24000-76000 units CPEP Take 2 capsules with each meal, 1 after starting/1 when finished eating, and 1 capsule with snack after starting to eat.     PARoxetine  (PAXIL ) 40 MG tablet Take 1 tablet (40 mg total) by mouth every morning. 90 tablet 3   Polyethyl Glyc-Propyl Glyc PF (SYSTANE ULTRA PF) 0.4-0.3 % SOLN Place 1 drop into both eyes daily as needed (Dry eye).     simvastatin  (ZOCOR ) 20 MG tablet Take 1 tablet (20 mg total) by mouth at bedtime. 90 tablet 0   sodium chloride  (OCEAN) 0.65 % SOLN nasal spray Place 1 spray into both nostrils as needed for congestion.     tiZANidine  (ZANAFLEX ) 4 MG tablet Take 4 mg by mouth daily as needed (muscle pain).     traZODone  (DESYREL ) 150 MG tablet Take 1 tablet (150 mg total) by mouth at bedtime. 90 tablet 2   vitamin B-12 (CYANOCOBALAMIN ) 1000 MCG tablet Take 1,000 mcg by mouth daily.     No current facility-administered medications for this visit.   Allergies:  Amoxicillin-pot clavulanate, Cephalosporins, Codeine, Cymbalta [duloxetine hcl], Duloxetine, Oxycodone, Penicillins, Baclofen , Povidone iodine, Flonase [fluticasone], Propoxyphene, Sulfa antibiotics, Sulfasalazine, Tramadol, Amitriptyline , Aspirin, Latex, Nsaids, and Tolmetin   Social History: The patient  reports that she has been smoking cigarettes. She has a 15 pack-year smoking history. She has never used smokeless tobacco. She reports that she does not currently use alcohol. She reports that she does not use drugs.   Family History: The patient's family history includes ADD / ADHD in some other family members; Alcohol abuse in her maternal uncle and paternal uncle; Anxiety disorder in her mother and sister; Bipolar disorder in her father and sister; Dementia (age of onset: 38) in her sister;  Healthy in an other family member; Paranoid behavior in her sister; Schizophrenia in her father.   ROS:  Please see the history of present illness. Otherwise, complete review of systems is positive for none  All other systems are reviewed and negative.   Physical Exam: VS:  BP 126/74 (BP Location: Right Arm, Cuff Size: Normal)   Pulse (!) 50   Ht 5' 4 (1.626 m)   Wt 111 lb (50.3 kg)   SpO2 96%   BMI 19.05 kg/m , BMI Body mass index is 19.05 kg/m.  Wt Readings from Last 3 Encounters:  12/17/24 111 lb (50.3 kg)  04/07/24 115 lb 9.6 oz (52.4 kg)  12/17/23 112 lb 3.2 oz (50.9 kg)    General: Patient appears comfortable at rest. HEENT: Conjunctiva and lids normal, oropharynx clear with moist mucosa. Neck: Supple, no elevated JVP or carotid bruits, no thyromegaly. Lungs: Clear to auscultation, nonlabored breathing at rest. Cardiac: Regular rate and rhythm, no S3 or significant systolic murmur, no pericardial rub. Abdomen: Soft, nontender, no hepatomegaly, bowel sounds present, no guarding or rebound. Extremities: No pitting edema, distal pulses 2+. Skin: Warm and dry. Musculoskeletal: No  kyphosis. Neuropsychiatric: Alert and oriented x3, affect grossly appropriate.  Recent Labwork: No results found for requested labs within last 365 days.  No results found for: CHOL, TRIG, HDL, CHOLHDL, VLDL, LDLCALC, LDLDIRECT    Assessment and Plan:   Severe sinus bradycardia: Asymptomatic.  She has positional dizziness but not exertional dizziness.  Discussed symptoms of sinus node dysfunction.  EKG today showed sinus bradycardia with HR 50 bpm and PACs.  Event monitor from 11/24 showed average HR 41 bpm, 3.7% PAC burden and less than 1% PVC burden.  Symptomatic with NSR, 48 bpm and PAC.  Echo normal.  Avoid AV nodal agents.  Currently not on any AV nodal agents.  HTN, controlled: Continue current antihypertensives, amlodipine  2.5 mg once daily.     Medication Adjustments/Labs  and Tests Ordered: Current medicines are reviewed at length with the patient today.  Concerns regarding medicines are outlined above.    Disposition:  Follow up 1 year  Signed Nadine Ryle Arleta Maywood, MD, 12/17/2024 11:18 AM    [facility] [address] [place], [place], [place] [address]

## 2025-01-07 ENCOUNTER — Telehealth (HOSPITAL_COMMUNITY): Payer: Self-pay | Admitting: *Deleted

## 2025-01-07 ENCOUNTER — Other Ambulatory Visit (HOSPITAL_COMMUNITY): Payer: Self-pay | Admitting: Psychiatry

## 2025-01-07 MED ORDER — ALPRAZOLAM 0.25 MG PO TABS: 0.2500 mg | ORAL_TABLET | Freq: Two times a day (BID) | ORAL | 0 refills | Status: AC

## 2025-01-07 NOTE — Telephone Encounter (Signed)
 I sent in 14 tablets but she should have had enough as it was sent in on 11/18 with 2 refills

## 2025-01-07 NOTE — Telephone Encounter (Signed)
 Per pt she was wondering if provider could send her in a script to last her until the 14th of this month. Per pt the pharmacy stated that they wont be able to get her script to her until the 14th. Per patient, she is out of her script and her manic is coming out and it's making her nervious. Per pt she knows the insurance will not pay twice so she'll pay out of pocket for the script that provider send to local pharmacy. Per pt she would like provider to send script to [facility].

## 2025-01-07 NOTE — Telephone Encounter (Signed)
 Which medication ?

## 2025-01-07 NOTE — Telephone Encounter (Signed)
 Xanax

## 2025-01-13 ENCOUNTER — Other Ambulatory Visit: Payer: Self-pay | Admitting: Internal Medicine

## 2025-01-13 NOTE — Telephone Encounter (Signed)
 Lipid Panel done on 09/09/24

## 2025-03-08 ENCOUNTER — Telehealth (HOSPITAL_COMMUNITY): Payer: Medicare (Managed Care) | Admitting: Psychiatry
